# Patient Record
Sex: Female | Born: 1950 | Race: Black or African American | Hispanic: No | Marital: Married | Smoking: Never smoker
Health system: Southern US, Community
[De-identification: ages and names within clinical notes are randomized; demographics above are authoritative.]

## PROBLEM LIST (undated history)

## (undated) DIAGNOSIS — R079 Chest pain, unspecified: Secondary | ICD-10-CM

## (undated) DIAGNOSIS — M1990 Unspecified osteoarthritis, unspecified site: Secondary | ICD-10-CM

## (undated) DIAGNOSIS — I10 Essential (primary) hypertension: Secondary | ICD-10-CM

## (undated) DIAGNOSIS — G459 Transient cerebral ischemic attack, unspecified: Secondary | ICD-10-CM

## (undated) DIAGNOSIS — I639 Cerebral infarction, unspecified: Secondary | ICD-10-CM

## (undated) DIAGNOSIS — I6529 Occlusion and stenosis of unspecified carotid artery: Secondary | ICD-10-CM

## (undated) DIAGNOSIS — I5189 Other ill-defined heart diseases: Secondary | ICD-10-CM

## (undated) DIAGNOSIS — E785 Hyperlipidemia, unspecified: Secondary | ICD-10-CM

## (undated) DIAGNOSIS — K219 Gastro-esophageal reflux disease without esophagitis: Secondary | ICD-10-CM

## (undated) DIAGNOSIS — J45909 Unspecified asthma, uncomplicated: Secondary | ICD-10-CM

## (undated) HISTORY — DX: Cerebral infarction, unspecified: I63.9

## (undated) HISTORY — PX: BREAST EXCISIONAL BIOPSY: SUR124

## (undated) HISTORY — DX: Other ill-defined heart diseases: I51.89

## (undated) HISTORY — DX: Transient cerebral ischemic attack, unspecified: G45.9

## (undated) HISTORY — DX: Occlusion and stenosis of unspecified carotid artery: I65.29

## (undated) HISTORY — DX: Hyperlipidemia, unspecified: E78.5

## (undated) HISTORY — PX: CHOLECYSTECTOMY: SHX55

## (undated) HISTORY — PX: CARDIAC CATHETERIZATION: SHX172

## (undated) HISTORY — PX: ABDOMINAL HYSTERECTOMY: SHX81

## (undated) HISTORY — DX: Chest pain, unspecified: R07.9

## (undated) HISTORY — DX: Unspecified asthma, uncomplicated: J45.909

---

## 1999-03-23 ENCOUNTER — Ambulatory Visit (HOSPITAL_COMMUNITY): Admission: RE | Admit: 1999-03-23 | Discharge: 1999-03-23 | Payer: Self-pay

## 2003-11-14 ENCOUNTER — Other Ambulatory Visit: Payer: Self-pay

## 2004-08-06 ENCOUNTER — Other Ambulatory Visit: Payer: Self-pay

## 2004-11-09 ENCOUNTER — Ambulatory Visit: Payer: Self-pay | Admitting: Occupational Therapy

## 2005-11-10 ENCOUNTER — Emergency Department: Payer: Self-pay | Admitting: General Practice

## 2005-11-17 ENCOUNTER — Emergency Department: Payer: Self-pay | Admitting: Emergency Medicine

## 2005-11-24 ENCOUNTER — Emergency Department (HOSPITAL_COMMUNITY): Admission: EM | Admit: 2005-11-24 | Discharge: 2005-11-24 | Payer: Self-pay | Admitting: Emergency Medicine

## 2005-11-28 ENCOUNTER — Ambulatory Visit: Payer: Self-pay | Admitting: Internal Medicine

## 2005-12-03 ENCOUNTER — Ambulatory Visit: Payer: Self-pay | Admitting: *Deleted

## 2005-12-24 ENCOUNTER — Ambulatory Visit: Payer: Self-pay | Admitting: Occupational Therapy

## 2006-01-09 ENCOUNTER — Ambulatory Visit: Payer: Self-pay | Admitting: Nurse Practitioner

## 2006-06-26 ENCOUNTER — Emergency Department (HOSPITAL_COMMUNITY): Admission: EM | Admit: 2006-06-26 | Discharge: 2006-06-27 | Payer: Self-pay | Admitting: Emergency Medicine

## 2006-07-07 ENCOUNTER — Emergency Department: Payer: Self-pay | Admitting: Internal Medicine

## 2006-07-15 ENCOUNTER — Emergency Department: Payer: Self-pay | Admitting: Emergency Medicine

## 2006-07-15 ENCOUNTER — Other Ambulatory Visit: Payer: Self-pay

## 2006-09-29 ENCOUNTER — Emergency Department: Payer: Self-pay | Admitting: Emergency Medicine

## 2006-12-15 ENCOUNTER — Other Ambulatory Visit: Payer: Self-pay

## 2006-12-15 ENCOUNTER — Emergency Department: Payer: Self-pay | Admitting: Emergency Medicine

## 2007-01-14 ENCOUNTER — Ambulatory Visit: Payer: Self-pay | Admitting: Nurse Practitioner

## 2008-01-21 ENCOUNTER — Emergency Department: Payer: Self-pay | Admitting: Emergency Medicine

## 2008-04-06 ENCOUNTER — Ambulatory Visit: Payer: Self-pay | Admitting: Nurse Practitioner

## 2008-09-04 ENCOUNTER — Emergency Department: Payer: Self-pay | Admitting: Emergency Medicine

## 2008-09-21 ENCOUNTER — Emergency Department: Payer: Self-pay | Admitting: Emergency Medicine

## 2008-12-08 ENCOUNTER — Encounter: Payer: Self-pay | Admitting: Nurse Practitioner

## 2008-12-19 ENCOUNTER — Encounter: Payer: Self-pay | Admitting: Nurse Practitioner

## 2008-12-21 ENCOUNTER — Inpatient Hospital Stay: Payer: Self-pay | Admitting: Internal Medicine

## 2009-01-04 ENCOUNTER — Ambulatory Visit: Payer: Self-pay | Admitting: General Surgery

## 2009-01-10 ENCOUNTER — Ambulatory Visit: Payer: Self-pay | Admitting: General Surgery

## 2009-01-16 ENCOUNTER — Encounter: Payer: Self-pay | Admitting: Nurse Practitioner

## 2009-01-17 ENCOUNTER — Ambulatory Visit: Payer: Self-pay | Admitting: General Surgery

## 2009-01-31 ENCOUNTER — Ambulatory Visit: Payer: Self-pay | Admitting: General Surgery

## 2009-02-15 ENCOUNTER — Ambulatory Visit: Payer: Self-pay | Admitting: General Surgery

## 2009-06-22 ENCOUNTER — Ambulatory Visit: Payer: Self-pay | Admitting: Unknown Physician Specialty

## 2009-06-22 ENCOUNTER — Ambulatory Visit: Payer: Self-pay | Admitting: Physician Assistant

## 2009-06-27 ENCOUNTER — Ambulatory Visit: Payer: Self-pay | Admitting: Physician Assistant

## 2009-07-08 ENCOUNTER — Emergency Department: Payer: Self-pay | Admitting: Emergency Medicine

## 2009-07-17 ENCOUNTER — Ambulatory Visit: Payer: Self-pay | Admitting: Family Medicine

## 2009-07-26 ENCOUNTER — Emergency Department: Payer: Self-pay | Admitting: Emergency Medicine

## 2009-11-29 ENCOUNTER — Emergency Department: Payer: Self-pay | Admitting: Emergency Medicine

## 2010-01-04 ENCOUNTER — Ambulatory Visit: Payer: Self-pay | Admitting: Family Medicine

## 2010-04-30 ENCOUNTER — Observation Stay: Payer: Self-pay | Admitting: *Deleted

## 2010-05-23 ENCOUNTER — Emergency Department: Payer: Self-pay | Admitting: Emergency Medicine

## 2010-06-12 ENCOUNTER — Emergency Department: Payer: Self-pay | Admitting: Emergency Medicine

## 2010-06-26 ENCOUNTER — Ambulatory Visit: Payer: Self-pay | Admitting: Family Medicine

## 2010-07-17 ENCOUNTER — Inpatient Hospital Stay: Payer: Self-pay | Admitting: Internal Medicine

## 2010-08-25 ENCOUNTER — Emergency Department: Payer: Self-pay | Admitting: Emergency Medicine

## 2010-09-12 ENCOUNTER — Emergency Department: Payer: Self-pay | Admitting: Emergency Medicine

## 2010-09-20 ENCOUNTER — Other Ambulatory Visit: Payer: Self-pay | Admitting: Gastroenterology

## 2010-10-25 ENCOUNTER — Emergency Department: Payer: Self-pay | Admitting: Emergency Medicine

## 2010-11-21 ENCOUNTER — Emergency Department: Payer: Self-pay | Admitting: Emergency Medicine

## 2011-03-11 ENCOUNTER — Emergency Department: Payer: Self-pay | Admitting: Emergency Medicine

## 2011-04-24 ENCOUNTER — Emergency Department: Payer: Self-pay | Admitting: Emergency Medicine

## 2011-05-26 ENCOUNTER — Emergency Department: Payer: Self-pay | Admitting: Internal Medicine

## 2011-06-05 ENCOUNTER — Emergency Department: Payer: Self-pay | Admitting: Emergency Medicine

## 2011-08-01 ENCOUNTER — Emergency Department: Payer: Self-pay | Admitting: Emergency Medicine

## 2011-08-06 ENCOUNTER — Ambulatory Visit: Payer: Self-pay | Admitting: Family Medicine

## 2011-08-13 ENCOUNTER — Emergency Department: Payer: Self-pay | Admitting: Emergency Medicine

## 2011-08-29 ENCOUNTER — Emergency Department: Payer: Self-pay | Admitting: Emergency Medicine

## 2011-10-04 ENCOUNTER — Ambulatory Visit: Payer: Self-pay | Admitting: Family Medicine

## 2011-11-23 ENCOUNTER — Emergency Department: Payer: Self-pay | Admitting: Unknown Physician Specialty

## 2011-11-23 LAB — COMPREHENSIVE METABOLIC PANEL
Albumin: 3.9 g/dL (ref 3.4–5.0)
Alkaline Phosphatase: 71 U/L (ref 50–136)
Anion Gap: 14 (ref 7–16)
Glucose: 205 mg/dL — ABNORMAL HIGH (ref 65–99)
Osmolality: 285 (ref 275–301)
Potassium: 3.7 mmol/L (ref 3.5–5.1)
Total Protein: 7.3 g/dL (ref 6.4–8.2)

## 2011-11-23 LAB — CBC
MCV: 94 fL (ref 80–100)
Platelet: 264 10*3/uL (ref 150–440)
RBC: 5.41 10*6/uL — ABNORMAL HIGH (ref 3.80–5.20)
RDW: 13.6 % (ref 11.5–14.5)

## 2011-11-23 LAB — LIPASE, BLOOD: Lipase: 49 U/L — ABNORMAL LOW (ref 73–393)

## 2011-12-11 ENCOUNTER — Emergency Department: Payer: Self-pay

## 2012-01-30 ENCOUNTER — Inpatient Hospital Stay: Payer: Self-pay | Admitting: Specialist

## 2012-01-30 LAB — COMPREHENSIVE METABOLIC PANEL
Albumin: 3.9 g/dL (ref 3.4–5.0)
Alkaline Phosphatase: 71 U/L (ref 50–136)
Calcium, Total: 9.4 mg/dL (ref 8.5–10.1)
Chloride: 103 mmol/L (ref 98–107)
Co2: 21 mmol/L (ref 21–32)
Creatinine: 0.83 mg/dL (ref 0.60–1.30)
EGFR (Non-African Amer.): >60
Glucose: 167 mg/dL — ABNORMAL HIGH (ref 65–99)
Osmolality: 287 (ref 275–301)
Potassium: 3.8 mmol/L (ref 3.5–5.1)
SGOT(AST): 21 U/L (ref 15–37)
Total Protein: 6.9 g/dL (ref 6.4–8.2)

## 2012-01-30 LAB — CBC
HCT: 47.2 % — ABNORMAL HIGH (ref 35.0–47.0)
HGB: 15.6 g/dL (ref 12.0–16.0)
MCH: 31.1 pg (ref 26.0–34.0)
MCV: 94 fL (ref 80–100)
Platelet: 276 10*3/uL (ref 150–440)
RBC: 5.03 10*6/uL (ref 3.80–5.20)
RDW: 13.5 % (ref 11.5–14.5)

## 2012-01-30 LAB — TROPONIN I: Troponin-I: <0.02 ng/mL

## 2012-01-30 LAB — LIPASE, BLOOD: Lipase: 55 U/L — ABNORMAL LOW (ref 73–393)

## 2012-01-31 LAB — CBC WITH DIFFERENTIAL/PLATELET
Basophil #: 0 10*3/uL (ref 0.0–0.1)
Basophil %: 0.1 %
HCT: 36.7 % (ref 35.0–47.0)
HGB: 12.1 g/dL (ref 12.0–16.0)
Lymphocyte #: 2.9 10*3/uL (ref 1.0–3.6)
MCH: 31 pg (ref 26.0–34.0)
MCV: 94 fL (ref 80–100)
Monocyte #: 0.7 10*3/uL (ref 0.0–0.7)
Monocyte %: 5.5 %
Neutrophil #: 8.6 10*3/uL — ABNORMAL HIGH (ref 1.4–6.5)
Platelet: 245 10*3/uL (ref 150–440)
RBC: 3.91 10*6/uL (ref 3.80–5.20)
RDW: 14 % (ref 11.5–14.5)

## 2012-01-31 LAB — BASIC METABOLIC PANEL
Anion Gap: 12 (ref 7–16)
Chloride: 107 mmol/L (ref 98–107)
Creatinine: 0.8 mg/dL (ref 0.60–1.30)
EGFR (African American): >60
EGFR (Non-African Amer.): >60
Osmolality: 285 (ref 275–301)
Potassium: 3.9 mmol/L (ref 3.5–5.1)
Sodium: 143 mmol/L (ref 136–145)

## 2012-01-31 LAB — MAGNESIUM: Magnesium: 1.6 mg/dL — ABNORMAL LOW

## 2012-03-03 ENCOUNTER — Emergency Department: Payer: Self-pay | Admitting: Emergency Medicine

## 2012-03-03 LAB — COMPREHENSIVE METABOLIC PANEL
Anion Gap: 11 (ref 7–16)
Calcium, Total: 9.6 mg/dL (ref 8.5–10.1)
Co2: 28 mmol/L (ref 21–32)
Creatinine: 1.01 mg/dL (ref 0.60–1.30)
EGFR (African American): >60
Glucose: 178 mg/dL — ABNORMAL HIGH (ref 65–99)
Osmolality: 286 (ref 275–301)
SGPT (ALT): 15 U/L
Sodium: 141 mmol/L (ref 136–145)
Total Protein: 7.4 g/dL (ref 6.4–8.2)

## 2012-03-03 LAB — URINALYSIS, COMPLETE
Hyaline Cast: 15
Leukocyte Esterase: NEGATIVE
Nitrite: NEGATIVE
Ph: 5 (ref 4.5–8.0)
Protein: 30
RBC,UR: 1 /HPF (ref 0–5)
Specific Gravity: 1.036 (ref 1.003–1.030)
Squamous Epithelial: 6
WBC UR: 5 /HPF (ref 0–5)

## 2012-03-03 LAB — CBC
HCT: 48.7 % — ABNORMAL HIGH (ref 35.0–47.0)
HGB: 16.2 g/dL — ABNORMAL HIGH (ref 12.0–16.0)
MCHC: 33.2 g/dL (ref 32.0–36.0)
MCV: 94 fL (ref 80–100)
Platelet: 260 10*3/uL (ref 150–440)
RBC: 5.2 10*6/uL (ref 3.80–5.20)
RDW: 13.6 % (ref 11.5–14.5)

## 2012-03-08 ENCOUNTER — Observation Stay: Payer: Self-pay | Admitting: Internal Medicine

## 2012-03-08 LAB — COMPREHENSIVE METABOLIC PANEL
Alkaline Phosphatase: 76 U/L (ref 50–136)
Anion Gap: 10 (ref 7–16)
Chloride: 106 mmol/L (ref 98–107)
EGFR (Non-African Amer.): >60
Glucose: 125 mg/dL — ABNORMAL HIGH (ref 65–99)
Osmolality: 284 (ref 275–301)
Potassium: 3.5 mmol/L (ref 3.5–5.1)
SGPT (ALT): 13 U/L
Total Protein: 6.8 g/dL (ref 6.4–8.2)

## 2012-03-08 LAB — URINALYSIS, COMPLETE
Bilirubin,UR: NEGATIVE
Nitrite: NEGATIVE
Ph: 5 (ref 4.5–8.0)
RBC,UR: 2 /HPF (ref 0–5)
Specific Gravity: 1.008 (ref 1.003–1.030)
WBC UR: 7 /HPF (ref 0–5)

## 2012-03-08 LAB — CBC
HGB: 12.5 g/dL (ref 12.0–16.0)
MCHC: 33 g/dL (ref 32.0–36.0)
RBC: 4.02 10*6/uL (ref 3.80–5.20)
RDW: 13.5 % (ref 11.5–14.5)
WBC: 10.9 10*3/uL (ref 3.6–11.0)

## 2012-03-09 LAB — CBC WITH DIFFERENTIAL/PLATELET
Basophil #: 0 10*3/uL (ref 0.0–0.1)
Basophil %: 0.5 %
Eosinophil #: 0.4 10*3/uL (ref 0.0–0.7)
Eosinophil %: 5.6 %
HCT: 34.5 % — ABNORMAL LOW (ref 35.0–47.0)
HGB: 11.5 g/dL — ABNORMAL LOW (ref 12.0–16.0)
Lymphocyte #: 2.9 10*3/uL (ref 1.0–3.6)
MCHC: 33.3 g/dL (ref 32.0–36.0)
MCV: 93 fL (ref 80–100)
Platelet: 211 10*3/uL (ref 150–440)
RBC: 3.71 10*6/uL — ABNORMAL LOW (ref 3.80–5.20)
RDW: 13.7 % (ref 11.5–14.5)
WBC: 7.2 10*3/uL (ref 3.6–11.0)

## 2012-03-09 LAB — HEMOGLOBIN A1C: Hemoglobin A1C: 6.3 % (ref 4.2–6.3)

## 2012-03-09 LAB — LIPID PANEL
Cholesterol: 157 mg/dL (ref 0–200)
HDL Cholesterol: 62 mg/dL — ABNORMAL HIGH (ref 40–60)
Ldl Cholesterol, Calc: 71 mg/dL (ref 0–100)
VLDL Cholesterol, Calc: 24 mg/dL (ref 5–40)

## 2012-03-31 ENCOUNTER — Emergency Department: Payer: Self-pay | Admitting: Emergency Medicine

## 2012-04-04 ENCOUNTER — Emergency Department: Payer: Self-pay | Admitting: *Deleted

## 2012-04-07 ENCOUNTER — Emergency Department: Payer: Self-pay | Admitting: Internal Medicine

## 2012-04-07 LAB — URINALYSIS, COMPLETE
Bacteria: NONE SEEN
Bilirubin,UR: NEGATIVE
Glucose,UR: NEGATIVE mg/dL (ref 0–75)
Ketone: NEGATIVE
Ph: 8 (ref 4.5–8.0)
Protein: NEGATIVE
RBC,UR: 1 /HPF (ref 0–5)
Squamous Epithelial: 2

## 2012-04-07 LAB — DRUG SCREEN, URINE
Amphetamines, Ur Screen: NEGATIVE
Barbiturates, Ur Screen: NEGATIVE
Benzodiazepine, Ur Scrn: POSITIVE
Cannabinoid 50 Ng, Ur ~~LOC~~: NEGATIVE
Cocaine Metabolite,Ur ~~LOC~~: NEGATIVE
MDMA (Ecstasy)Ur Screen: NEGATIVE
Methadone, Ur Screen: NEGATIVE
Opiate, Ur Screen: NEGATIVE
Phencyclidine (PCP) Ur S: NEGATIVE
Tricyclic, Ur Screen: NEGATIVE

## 2012-04-07 LAB — COMPREHENSIVE METABOLIC PANEL
Alkaline Phosphatase: 73 U/L (ref 50–136)
Anion Gap: 9 (ref 7–16)
Bilirubin,Total: 0.4 mg/dL (ref 0.2–1.0)
Co2: 28 mmol/L (ref 21–32)
Creatinine: 0.86 mg/dL (ref 0.60–1.30)
EGFR (Non-African Amer.): >60
Glucose: 123 mg/dL — ABNORMAL HIGH (ref 65–99)
Osmolality: 281 (ref 275–301)
SGOT(AST): 10 U/L — ABNORMAL LOW (ref 15–37)
SGPT (ALT): 12 U/L
Sodium: 140 mmol/L (ref 136–145)
Total Protein: 7.1 g/dL (ref 6.4–8.2)

## 2012-04-07 LAB — CBC
HCT: 38 %
HGB: 12.8 g/dL
MCH: 31.3 pg
MCHC: 33.7 g/dL
MCV: 93 fL
Platelet: 206 x10 3/mm 3
RBC: 4.1 X10 6/mm 3
RDW: 13.5 %
WBC: 8.2 x10 3/mm 3

## 2012-04-07 LAB — PROTIME-INR: INR: 0.8

## 2012-04-07 LAB — CK TOTAL AND CKMB (NOT AT ARMC): CK, Total: 31 U/L (ref 21–215)

## 2012-04-15 ENCOUNTER — Other Ambulatory Visit: Payer: Self-pay | Admitting: Orthopedic Surgery

## 2012-04-15 DIAGNOSIS — M542 Cervicalgia: Secondary | ICD-10-CM

## 2012-04-19 ENCOUNTER — Ambulatory Visit
Admission: RE | Admit: 2012-04-19 | Discharge: 2012-04-19 | Disposition: A | Discharge status: Home / Self Care | Payer: BC Managed Care – PPO | Source: Ambulatory Visit | Attending: Orthopedic Surgery | Admitting: Orthopedic Surgery

## 2012-04-19 DIAGNOSIS — M542 Cervicalgia: Secondary | ICD-10-CM

## 2012-05-10 ENCOUNTER — Encounter (HOSPITAL_COMMUNITY): Payer: Self-pay

## 2012-05-10 ENCOUNTER — Emergency Department (HOSPITAL_COMMUNITY)
Admission: EM | Admit: 2012-05-10 | Discharge: 2012-05-10 | Disposition: A | Discharge status: Home / Self Care | Payer: BC Managed Care – PPO | Attending: Emergency Medicine | Admitting: Emergency Medicine

## 2012-05-10 DIAGNOSIS — M549 Dorsalgia, unspecified: Secondary | ICD-10-CM | POA: Insufficient documentation

## 2012-05-10 DIAGNOSIS — M542 Cervicalgia: Secondary | ICD-10-CM | POA: Insufficient documentation

## 2012-05-10 HISTORY — DX: Essential (primary) hypertension: I10

## 2012-05-10 HISTORY — DX: Cerebral infarction, unspecified: I63.9

## 2012-05-10 HISTORY — DX: Unspecified osteoarthritis, unspecified site: M19.90

## 2012-05-10 MED ORDER — OXYCODONE-ACETAMINOPHEN 5-325 MG PO TABS: 1.0000 | ORAL_TABLET | Freq: Four times a day (QID) | ORAL | Status: AC | PRN

## 2012-05-10 MED ORDER — DIAZEPAM 5 MG PO TABS
5.0000 mg | ORAL_TABLET | Freq: Once | ORAL | Status: AC
  Administered 2012-05-10: 5 mg via ORAL
  Filled 2012-05-10: qty 1

## 2012-05-10 MED ORDER — OXYCODONE-ACETAMINOPHEN 5-325 MG PO TABS
2.0000 | ORAL_TABLET | Freq: Once | ORAL | Status: AC
  Administered 2012-05-10: 2 via ORAL
  Filled 2012-05-10: qty 2

## 2012-05-10 MED ORDER — MORPHINE SULFATE 4 MG/ML IJ SOLN
INTRAMUSCULAR | Status: AC
  Filled 2012-05-10: qty 1

## 2012-05-10 MED ORDER — DIAZEPAM 5 MG PO TABS: 5.0000 mg | ORAL_TABLET | Freq: Two times a day (BID) | ORAL | Status: DC

## 2012-05-10 NOTE — ED Provider Notes (Signed)
History     CSN: [id number]  Arrival date & time 05/10/12  1015   First MD Initiated Contact with Patient 05/10/12 1045      Chief Complaint  Patient presents with  . Neck Pain  . Back Pain    (Consider location/radiation/quality/duration/timing/severity/associated sxs/prior treatment) HPI Comments: Patient comes in with a chief complaint of right sided neck pain.  Pain has been present for the past month.  She has been evaluated by Orthopedics for this and was prescribed Hydrocodone and Robaxin, which she does not feel is helping.  She also had a recent MRI, which showed mild foraminal narrowing at multiple levels of the c-spine.  She has also seen her Chiropractor for this pain and has had adjustments done, which she does not think has helped.  She denies any numbness or tingling.  Denies any acute trauma or injury.  Neck pain worse with lateral movement of the neck.  She denies headache.  Denies upper extremity weakness.  Patient is a 61 y.o. female presenting with neck pain and back pain. The history is provided by the patient and medical records.  Neck Pain  Pertinent negatives include no numbness and no headaches.  Back Pain  Pertinent negatives include no fever, no numbness and no headaches.    No past medical history on file.  No past surgical history on file.  No family history on file.  History  Substance Use Topics  . Smoking status: Not on file  . Smokeless tobacco: Not on file  . Alcohol Use:     OB History    Grav Para Term Preterm Abortions TAB SAB Ect Mult Living                  Review of Systems  Constitutional: Negative for fever and chills.  HENT: Positive for neck pain and neck stiffness.   Eyes: Negative for visual disturbance.  Gastrointestinal: Negative for nausea and vomiting.  Musculoskeletal: Positive for back pain. Negative for gait problem.  Skin: Negative for color change.  Neurological: Negative for dizziness, syncope,  light-headedness, numbness and headaches.    Allergies  Latex and Lidocaine  Home Medications   Current Outpatient Rx  Name Route Sig Dispense Refill  . CARVEDILOL 6.25 MG PO TABS Oral Take 6.25 mg by mouth 2 (two) times daily with a meal.    . CLOPIDOGREL BISULFATE 75 MG PO TABS Oral Take 75 mg by mouth daily.    Marland Kitchen HYDROCODONE-ACETAMINOPHEN 7.5-325 MG PO TABS Oral Take 1 tablet by mouth every 6 (six) hours as needed. For pain    . METFORMIN HCL ER 500 MG PO TB24 Oral Take 1,000 mg by mouth daily with breakfast.    . METHOCARBAMOL 500 MG PO TABS Oral Take 500 mg by mouth every 8 (eight) hours as needed. For muscle spasms    . OMEPRAZOLE 20 MG PO CPDR Oral Take 20 mg by mouth daily.    Marland Kitchen SIMVASTATIN 40 MG PO TABS Oral Take 40 mg by mouth every evening.      BP 168/94  Pulse 84  Temp 98 F (36.7 C) (Oral)  Resp 16  SpO2 98%  Physical Exam  Nursing note and vitals reviewed. Constitutional: She appears well-developed and well-nourished. No distress.  HENT:  Head: Normocephalic and atraumatic.  Mouth/Throat: Oropharynx is clear and moist.  Eyes: EOM are normal. Pupils are equal, round, and reactive to light.  Neck: Muscular tenderness present. No spinous process tenderness present. Decreased range of  motion present. No edema and no erythema present.       Decreased lateral movement of the neck.  Cardiovascular: Normal rate, regular rhythm, normal heart sounds and intact distal pulses.   Pulmonary/Chest: Effort normal and breath sounds normal. No respiratory distress. She has no wheezes.  Neurological: She is alert. She has normal strength. No sensory deficit. Gait normal.  Reflex Scores:      Brachioradialis reflexes are 2+ on the right side and 2+ on the left side.      Grip strength 5/5 bilaterally  Skin: Skin is warm and dry. She is not diaphoretic. No erythema.  Psychiatric: She has a normal mood and affect.    ED Course  Procedures (including critical care time)  Labs  Reviewed - No data to display No results found.   No diagnosis found.    MDM  Patient presenting with neck pain for the past month.  Patient with recent MRI showing mild foraminal narrowing at multiple levels of c-spine.  Patient requesting referral to a different Orthopedist.  Patient given referral and instructed to follow up with them.        Pascal Lux [place], PA-C 05/10/12 2052

## 2012-05-10 NOTE — ED Provider Notes (Signed)
Medical screening examination/treatment/procedure(s) were performed by non-physician practitioner and as supervising physician I was immediately available for consultation/collaboration.   Carleene Cooper III, MD 05/10/12 [phone]

## 2012-05-10 NOTE — ED Notes (Signed)
Pt reports having a "crick" in her posterior neck and (R) side neck starting 03/31/2012. Pt reports (R) ear pain x2 days, states "my ear is stopped up." Pt reports coming into the ED several times receiving IV pain meds, prescription pain meds, muscle relaxer, and using OTC "muscle ache cream, ice/heat packs" Pt reports seeing Dr. Alveda Reasons, had a MRI of her neck and back and was informed she had arthritis in her lower back. Pt also seeing a chiropractor; Dr. Glendale Chard twice a week, he informed pt her neck was "out of line." Pt states "somebody has got to tell me something, I can't keep dealing with this pain."

## 2012-05-10 NOTE — ED Notes (Signed)
Pt c/o neck pain x1 month

## 2012-05-10 NOTE — Discharge Instructions (Signed)
Only take pain medications for severe pain.  Do not drive or operate heavy machinery while taking pain medication or muscle relaxer.  Cervical Sprain A cervical sprain is an injury in the neck in which the ligaments are stretched or torn. The ligaments are the tissues that hold the bones of the neck (vertebrae) in place.Cervical sprains can range from very mild to very severe. Most cervical sprains get better in 1 to 3 weeks, but it depends on the cause and extent of the injury. Severe cervical sprains can cause the neck vertebrae to be unstable. This can lead to damage of the spinal cord and can result in serious nervous system problems. Your caregiver will determine whether your cervical sprain is mild or severe. CAUSES  Severe cervical sprains may be caused by:  Contact sport injuries (football, rugby, wrestling, hockey, auto racing, gymnastics, diving, martial arts, boxing).   Motor vehicle collisions.   Whiplash injuries. This means the neck is forcefully whipped backward and forward.   Falls.  Mild cervical sprains may be caused by:   Awkward positions, such as cradling a telephone between your ear and shoulder.   Sitting in a chair that does not offer proper support.   Working at a poorly Marketing executive station.   Activities that require looking up or down for long periods of time.  SYMPTOMS   Pain, soreness, stiffness, or a burning sensation in the front, back, or sides of the neck. This discomfort may develop immediately after injury or it may develop slowly and not begin for 24 hours or more after an injury.   Pain or tenderness directly in the middle of the back of the neck.   Shoulder or upper back pain.   Limited ability to move the neck.   Headache.   Dizziness.   Weakness, numbness, or tingling in the hands or arms.   Muscle spasms.   Difficulty swallowing or chewing.   Tenderness and swelling of the neck.  DIAGNOSIS  Most of the time, your caregiver  can diagnose this problem by taking your history and doing a physical exam. Your caregiver will ask about any known problems, such as arthritis in the neck or a previous neck injury. X-rays may be taken to find out if there are any other problems, such as problems with the bones of the neck. However, an X-ray often does not reveal the full extent of a cervical sprain. Other tests such as a computed tomography (CT) scan or magnetic resonance imaging (MRI) may be needed. TREATMENT  Treatment depends on the severity of the cervical sprain. Mild sprains can be treated with rest, keeping the neck in place (immobilization), and pain medicines. Severe cervical sprains need immediate immobilization and an appointment with an orthopedist or neurosurgeon. Several treatment options are available to help with pain, muscle spasms, and other symptoms. Your caregiver may prescribe:  Medicines, such as pain relievers, numbing medicines, or muscle relaxants.   Physical therapy. This can include stretching exercises, strengthening exercises, and posture training. Exercises and improved posture can help stabilize the neck, strengthen muscles, and help stop symptoms from returning.   A neck collar to be worn for short periods of time. Often, these collars are worn for comfort. However, certain collars may be worn to protect the neck and prevent further worsening of a serious cervical sprain.  HOME CARE INSTRUCTIONS   Put ice on the injured area.   Put ice in a plastic bag.   Place a towel between your  skin and the bag.   Leave the ice on for 15 to 20 minutes, 3 to 4 times a day.   Only take over-the-counter or prescription medicines for pain, discomfort, or fever as directed by your caregiver.   Keep all follow-up appointments as directed by your caregiver.   Keep all physical therapy appointments as directed by your caregiver.   If a neck collar is prescribed, wear it as directed by your caregiver.   Do not  drive while wearing a neck collar.   Make any needed adjustments to your work station to promote good posture.   Avoid positions and activities that make your symptoms worse.   Warm up and stretch before being active to help prevent problems.  SEEK MEDICAL CARE IF:   Your pain is not controlled with medicine.   You are unable to decrease your pain medicine over time as planned.   Your activity level is not improving as expected.  SEEK IMMEDIATE MEDICAL CARE IF:   You develop any bleeding, stomach upset, or signs of an allergic reaction to your medicine.   Your symptoms get worse.   You develop new, unexplained symptoms.   You have numbness, tingling, weakness, or paralysis in any part of your body.  MAKE SURE YOU:   Understand these instructions.   Will watch your condition.   Will get help right away if you are not doing well or get worse.  Document Released: 09/01/2007 Document Revised: 10/24/2011 Document Reviewed: 08/07/2011 [facility] Patient Information 2012 [place], [place].

## 2012-05-10 NOTE — ED Notes (Signed)
Pt requesting a referral for a neurologist and PT consult

## 2012-05-11 ENCOUNTER — Encounter (HOSPITAL_COMMUNITY): Payer: Self-pay | Admitting: *Deleted

## 2012-05-11 ENCOUNTER — Observation Stay (HOSPITAL_COMMUNITY): Payer: BC Managed Care – PPO

## 2012-05-11 ENCOUNTER — Observation Stay (HOSPITAL_COMMUNITY)
Admission: EM | Admit: 2012-05-11 | Discharge: 2012-05-13 | Disposition: A | Discharge status: Home / Self Care | Payer: BC Managed Care – PPO | Attending: Family Medicine | Admitting: Family Medicine

## 2012-05-11 DIAGNOSIS — E785 Hyperlipidemia, unspecified: Secondary | ICD-10-CM

## 2012-05-11 DIAGNOSIS — R109 Unspecified abdominal pain: Secondary | ICD-10-CM | POA: Insufficient documentation

## 2012-05-11 DIAGNOSIS — H8110 Benign paroxysmal vertigo, unspecified ear: Secondary | ICD-10-CM

## 2012-05-11 DIAGNOSIS — I10 Essential (primary) hypertension: Secondary | ICD-10-CM

## 2012-05-11 DIAGNOSIS — Z8673 Personal history of transient ischemic attack (TIA), and cerebral infarction without residual deficits: Secondary | ICD-10-CM | POA: Insufficient documentation

## 2012-05-11 DIAGNOSIS — Q283 Other malformations of cerebral vessels: Secondary | ICD-10-CM | POA: Insufficient documentation

## 2012-05-11 DIAGNOSIS — E119 Type 2 diabetes mellitus without complications: Secondary | ICD-10-CM

## 2012-05-11 DIAGNOSIS — M542 Cervicalgia: Secondary | ICD-10-CM | POA: Diagnosis present

## 2012-05-11 DIAGNOSIS — R197 Diarrhea, unspecified: Secondary | ICD-10-CM | POA: Insufficient documentation

## 2012-05-11 DIAGNOSIS — R42 Dizziness and giddiness: Principal | ICD-10-CM | POA: Insufficient documentation

## 2012-05-11 LAB — BASIC METABOLIC PANEL
Calcium: 10.6 mg/dL — ABNORMAL HIGH (ref 8.4–10.5)
GFR calc Af Amer: >90 mL/min (ref >90)
GFR calc non Af Amer: >90 mL/min (ref >90)
Potassium: 4 mEq/L (ref 3.5–5.1)
Sodium: 140 mEq/L (ref 135–145)

## 2012-05-11 LAB — DIFFERENTIAL
Basophils Absolute: 0 10*3/uL (ref 0.0–0.1)
Eosinophils Absolute: 0.1 10*3/uL (ref 0.0–0.7)
Eosinophils Relative: 1 % (ref 0–5)
Lymphs Abs: 1.4 10*3/uL (ref 0.7–4.0)
Neutrophils Relative %: 76 % (ref 43–77)

## 2012-05-11 LAB — CBC
MCH: 30.9 pg (ref 26.0–34.0)
MCV: 90.4 fL (ref 78.0–100.0)
Platelets: 242 10*3/uL (ref 150–400)
RBC: 4.7 MIL/uL (ref 3.87–5.11)
RDW: 12.4 % (ref 11.5–15.5)
WBC: 8.4 10*3/uL (ref 4.0–10.5)

## 2012-05-11 LAB — GLUCOSE, CAPILLARY
Glucose-Capillary: 116 mg/dL — ABNORMAL HIGH (ref 70–99)
Glucose-Capillary: 119 mg/dL — ABNORMAL HIGH (ref 70–99)

## 2012-05-11 MED ORDER — CARVEDILOL 6.25 MG PO TABS
6.2500 mg | ORAL_TABLET | Freq: Once | ORAL | Status: AC
  Administered 2012-05-11: 6.25 mg via ORAL
  Filled 2012-05-11: qty 1

## 2012-05-11 MED ORDER — DIAZEPAM 5 MG/ML IJ SOLN
5.0000 mg | Freq: Once | INTRAMUSCULAR | Status: AC
  Administered 2012-05-11: 5 mg via INTRAVENOUS
  Filled 2012-05-11: qty 2

## 2012-05-11 MED ORDER — MECLIZINE HCL 25 MG PO TABS
25.0000 mg | ORAL_TABLET | Freq: Once | ORAL | Status: AC
  Administered 2012-05-11: 25 mg via ORAL
  Filled 2012-05-11: qty 1

## 2012-05-11 MED ORDER — GADOBENATE DIMEGLUMINE 529 MG/ML IV SOLN
20.0000 mL | Freq: Once | INTRAVENOUS | Status: AC | PRN
  Administered 2012-05-11: 20 mL via INTRAVENOUS

## 2012-05-11 MED ORDER — FENTANYL CITRATE 0.05 MG/ML IJ SOLN
50.0000 ug | Freq: Once | INTRAMUSCULAR | Status: AC
  Administered 2012-05-11: 50 ug via INTRAVENOUS
  Filled 2012-05-11: qty 2

## 2012-05-11 MED ORDER — HYDROMORPHONE HCL PF 1 MG/ML IJ SOLN
0.5000 mg | Freq: Once | INTRAMUSCULAR | Status: AC
  Administered 2012-05-11: 0.5 mg via INTRAVENOUS
  Filled 2012-05-11: qty 1

## 2012-05-11 MED ORDER — FENTANYL CITRATE 0.05 MG/ML IJ SOLN
50.0000 ug | Freq: Once | INTRAMUSCULAR | Status: AC
Start: 2012-05-11 — End: 2012-05-11
  Administered 2012-05-11: 50 ug via INTRAVENOUS
  Filled 2012-05-11: qty 2

## 2012-05-11 NOTE — ED Provider Notes (Signed)
History     CSN: [id number]  Arrival date & time 05/11/12  1107   First MD Initiated Contact with Patient 05/11/12 1531      Chief Complaint  Patient presents with  . Dizziness  . Neck Pain  . Shoulder Pain    (Consider location/radiation/quality/duration/timing/severity/associated sxs/prior treatment) HPI Comments: Patient presents with persistent right neck and shoulder pain that's been ongoing for the last month.  Patient does note that she's seen her primary care physician for this as well as an orthopedic Dr. and had an MRI of her neck completed at the end of May.  This showed some mild arthritis changes and no significant spinal stenosis.  Patient has no defined weakness or numbness.  Patient was seen in the emergency part yesterday for pain and given Percocet and Robaxin.  Patient actually has a scheduled followup with orthopedics tomorrow.  Patient comes back in today because last night in the middle of the night she felt too dizzy and was unable to get out of bed on her own.  She felt that the room was spinning which is a new sensation for her.  She also feels like her right ear is plugged.  She has some degree of tinnitus.  Again there's been no focal weakness, numbness or slurred speech.  Patient notes when she tries to walk she feels very unsteady and like she is drifting to the right because things are moving.  Patient's had no new falls or trauma.  She has seen a chiropractor for her neck pain who has performed some adjustments.  Patient also notes she didn't take any of her morning medications.  She's not taking any pain medications or muscle relaxants this morning either.  The history is provided by the patient.    Past Medical History  Diagnosis Date  . Diabetes mellitus   . Hypertension   . Stroke   . Arthritis     Past Surgical History  Procedure Date  . Abdominal hysterectomy   . Cholecystectomy     History reviewed. No pertinent family history.  History    Substance Use Topics  . Smoking status: Never Smoker   . Smokeless tobacco: Not on file  . Alcohol Use: No    OB History    Grav Para Term Preterm Abortions TAB SAB Ect Mult Living                  Review of Systems  Constitutional: Negative.  Negative for fever and chills.  Eyes: Negative.   Respiratory: Negative.  Negative for cough and shortness of breath.   Cardiovascular: Negative.  Negative for chest pain.  Gastrointestinal: Positive for nausea. Negative for vomiting, abdominal pain and diarrhea.  Genitourinary: Negative.  Negative for dysuria.  Musculoskeletal: Negative for back pain.  Skin: Negative.  Negative for color change and rash.  Neurological: Positive for dizziness and headaches. Negative for syncope, facial asymmetry, weakness, light-headedness and numbness.  Hematological: Negative.  Negative for adenopathy.  Psychiatric/Behavioral: Negative.   All other systems reviewed and are negative.    Allergies  Latex and Lidocaine  Home Medications   Current Outpatient Rx  Name Route Sig Dispense Refill  . CARVEDILOL 6.25 MG PO TABS Oral Take 6.25 mg by mouth 2 (two) times daily with a meal.    . CLOPIDOGREL BISULFATE 75 MG PO TABS Oral Take 75 mg by mouth daily.    Marland Kitchen DIAZEPAM 5 MG PO TABS Oral Take 1 tablet (5 mg total) by  mouth 2 (two) times daily. 10 tablet 0  . METFORMIN HCL ER 500 MG PO TB24 Oral Take 500 mg by mouth daily with breakfast.     . METHOCARBAMOL 500 MG PO TABS Oral Take 500 mg by mouth every 8 (eight) hours as needed. For muscle spasms    . OMEPRAZOLE 20 MG PO CPDR Oral Take 20 mg by mouth daily.    . OXYCODONE-ACETAMINOPHEN 5-325 MG PO TABS Oral Take 1-2 tablets by mouth every 6 (six) hours as needed for pain. 20 tablet 0  . SIMVASTATIN 40 MG PO TABS Oral Take 40 mg by mouth every evening.      BP 164/102  Pulse 87  Temp 98.5 F (36.9 C) (Oral)  Resp 22  SpO2 100%  Physical Exam  Nursing note and vitals reviewed. Constitutional: She  is oriented to person, place, and time. She appears well-developed and well-nourished.  Non-toxic appearance. She does not have a sickly appearance.  HENT:  Head: Normocephalic and atraumatic.  Eyes: Conjunctivae, EOM and lids are normal. Pupils are equal, round, and reactive to light. No scleral icterus.  Neck: Trachea normal and normal range of motion. Neck supple.  Cardiovascular: Normal rate, regular rhythm and normal heart sounds.        Palpable radial pulses bilaterally  Pulmonary/Chest: Effort normal and breath sounds normal.  Abdominal: Soft. Normal appearance. There is no tenderness. There is no rebound, no guarding and no CVA tenderness.  Musculoskeletal: Normal range of motion.  Neurological: She is alert and oriented to person, place, and time. She has normal strength.       Face is symmetric.  Extraocular eye movements are intact.  She does gets onset of vertigo sensation with extraocular eye movements.  She was able to perform finger to nose testing bilaterally but did appear to have some difficulty with this and had to do it slowly.  She had symmetric strength in her upper extremities but noted pain on trying to raise her right arm to shoulder height.  She had symmetric strength in her lower extremities but did not ambulate due to the dizziness and spinning sensation for me.  No focal losses of sensation.  No dysarthria.  Skin: Skin is warm, dry and intact. No rash noted.  Psychiatric: She has a normal mood and affect. Her behavior is normal. Judgment and thought content normal.    ED Course  Procedures (including critical care time)  Labs Reviewed  GLUCOSE, CAPILLARY - Abnormal; Notable for the following:    Glucose-Capillary 116 (*)     All other components within normal limits  CBC  DIFFERENTIAL  BASIC METABOLIC PANEL   No results found.   No diagnosis found.    MDM  Patient with right-sided neck pain that has been ongoing for the last month but given her new  symptoms of vertigo with history of stroke, hypertension and diabetes I'm concerned about possible vertebral basilar insufficiency or vertebral artery dissection given recent visits to the chiropractor.  I will give the patient meclizine to see if it assists with her symptoms.  I believe the patient should also have an MRI of her brain and an MRA to assess for the above noted diagnoses.  Patient's elevated blood pressure is likely related to patient missing doses of medications this morning.        Nat Christen, MD 05/11/12 [phone]

## 2012-05-11 NOTE — H&P (Signed)
Family Medicine Teaching [facility] Admission History and Physical  Patient name: Destiny Savage Medical record number: [id number] Date of birth: 02-15-1951 Age: 61 y.o. Gender: female  Primary Care Provider: Jaclyn Shaggy, MD  Chief Complaint: vertigo History of Present Illness: Destiny Savage is a 61 y.o. year old female with HTN, HLD, DM, and right sided neck pain presenting with vertigo x1 day.  She was in her usual state of health until 3am this morning.  At that time she sat up in bed and complained of dizziness (like the room was spinning) and her head feeling really heavy causing her to fall back in bed.  This was repeated with her husband helping her to sit up.  She also experienced the room spinning when she would turn her head, but worse with sitting.  States does okay as long as she can lie down.  Mild nausea.  No emesis, no loss of consciousness, no head trauma.  Eating and drinking well.  No fevers, cough, dyspnea, diaphoresis, chest pain, abdominal pain, diarrhea.  Has never had anything like this before.  Of note, has been having right sided neck pain for the last month and she was started on percocet, robaxin and valium for this.   ED Course: meclizine x1, valium x1 with minimal improvement  Patient Active Problem List  Diagnosis  . Vertigo  . Hypertension  . Diabetes type 2, controlled  . Neck pain on right side   Past Medical History: Past Medical History  Diagnosis Date  . Diabetes mellitus   . Hypertension   . Stroke   . Arthritis     Past Surgical History: Past Surgical History  Procedure Date  . Abdominal hysterectomy   . Cholecystectomy     Social History: History   Social History  . Marital Status: Single    Spouse Name: N/A    Number of Children: N/A  . Years of Education: N/A   Social History Main Topics  . Smoking status: Never Smoker   . Smokeless tobacco: None  . Alcohol Use: No  . Drug Use: No  . Sexually Active:    Other Topics  Concern  . None   Social History Narrative  . None  Lives at home with husband; doing very well.   Family History: History reviewed. No pertinent family history.  Allergies: Allergies  Allergen Reactions  . Latex Hives  . Lidocaine Hives    Current Facility-Administered Medications  Medication Dose Route Frequency Provider Last Rate Last Dose  . carvedilol (COREG) tablet 6.25 mg  6.25 mg Oral Once Nat Christen, MD   6.25 mg at 05/11/12 1936  . diazepam (VALIUM) injection 5 mg  5 mg Intravenous Once [employer], PA-C   5 mg at 05/11/12 1940  . fentaNYL (SUBLIMAZE) injection 50 mcg  50 mcg Intravenous Once Nat Christen, MD   50 mcg at 05/11/12 1659  . fentaNYL (SUBLIMAZE) injection 50 mcg  50 mcg Intravenous Once [employer], PA-C   50 mcg at 05/11/12 1931  . gadobenate dimeglumine (MULTIHANCE) injection 20 mL  20 mL Intravenous Once PRN Medication Radiologist, MD   20 mL at 05/11/12 1835  . HYDROmorphone (DILAUDID) injection 0.5 mg  0.5 mg Intravenous Once Rodena Medin, PA-C   0.5 mg at 05/11/12 2118  . meclizine (ANTIVERT) tablet 25 mg  25 mg Oral Once Nat Christen, MD   25 mg at 05/11/12 1610   Current Outpatient Prescriptions  Medication Sig Dispense Refill  .  carvedilol (COREG) 6.25 MG tablet Take 6.25 mg by mouth 2 (two) times daily with a meal.      . clopidogrel (PLAVIX) 75 MG tablet Take 75 mg by mouth daily.      . diazepam (VALIUM) 5 MG tablet Take 1 tablet (5 mg total) by mouth 2 (two) times daily.  10 tablet  0  . metFORMIN (GLUCOPHAGE-XR) 500 MG 24 hr tablet Take 500 mg by mouth daily with breakfast.       . methocarbamol (ROBAXIN) 500 MG tablet Take 500 mg by mouth every 8 (eight) hours as needed. For muscle spasms      . omeprazole (PRILOSEC) 20 MG capsule Take 20 mg by mouth daily.      Marland Kitchen oxyCODONE-acetaminophen (PERCOCET) 5-325 MG per tablet Take 1-2 tablets by mouth every 6 (six) hours as needed for pain.  20 tablet  0  . simvastatin  (ZOCOR) 40 MG tablet Take 40 mg by mouth every evening.       Review Of Systems: Per HPI with the following additions: none Otherwise 12 point review of systems was performed and was unremarkable.  Physical Exam: Pulse: 84  Blood Pressure: 146/78 RR: 16   O2: 98 on RA Temp: 99  General: alert, cooperative, appears stated age, no distress, moderately obese and mild distress with siiting up HEENT: PERRLA, extra ocular movement intact, sclera clear, anicteric and oropharynx clear, no lesions Heart: S1, S2 normal, no murmur, rub or gallop, regular rate and rhythm Lungs: clear to auscultation, no wheezes or rales and unlabored breathing Abdomen: abdomen is soft without significant tenderness, masses, organomegaly or guarding Extremities: extremities normal, atraumatic, no cyanosis or edema Skin:no rashes, no ecchymoses Neurology: normal without focal findings, mental status, speech normal, alert and oriented x3, PERLA and cranial nerves 2-12 intact; no nystagmus on exam, patient did complain of room spinning with sitting (no nystagmus)  Labs and Imaging: Lab Results  Component Value Date/Time   NA 140 05/11/2012  3:57 PM   K 4.0 05/11/2012  3:57 PM   CL 99 05/11/2012  3:57 PM   CO2 28 05/11/2012  3:57 PM   BUN 7 05/11/2012  3:57 PM   CREATININE 0.73 05/11/2012  3:57 PM   GLUCOSE 116* 05/11/2012  3:57 PM   Lab Results  Component Value Date   WBC 8.4 05/11/2012   HGB 14.5 05/11/2012   HCT 42.5 05/11/2012   MCV 90.4 05/11/2012   PLT 242 05/11/2012   MRI Brain No acute intracranial findings.  MRA head Negative. No intracranial stenosis or aneurysm.  MRA neck No evidence for carotid or vertebral dissection. Nonstenotic plaque  both carotid bifurcations.  Assessment and Plan: Destiny Savage is a 61 y.o. year old female with HTN, DM, HLD presenting with positional vertigo.  # Vertigo: Likely BPPV.  MRI/A did not show any acute lesions.  Neurologic exam normal making central vertigo less  likely.  Vertigo is a potential side effect of robaxin which she takes for neck pain.  Unable to complete Dix-Hallpike given patient size and vertigo with sitting. - will hold robaxin - consult vestibular PT - meclizine 25mg  q6hr  # Neck pain: Ongoing for last month.  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 Is beeing seen by orthopedics.  Currently taking robaxin, percocet, valium. - will hold robaxin given vertigo - continue home percocet and valium  # HTN: Elevated in the ED.  - continue home carvedilol - will give hydralazine 10mg  IV  for BP >180/110  # HLD: Stable.  Well controlled per patient. - continue home statin  # DM: Well controlled per patient report.  CBGs in the 110s in the ED. - will hold metformin given contrast study in ED - monitor CBGs; will start SSI if needed  # H/o TIA: Stable.  - continue home plavix  FEN/GI: carb modified diet; SLIV Prophylaxis: SQ lovenox Disposition: admit to floor for observation  BOOTH, ERIN 05/11/2012, 11:00 PM   UPPER LEVEL ADDENDUM  I have seen and examined Destiny Savage with Dr. Elwyn Reach and I agree with the above assessment/plan. I have reviewed all available data and have made any necessary changes to the above H&P.  Destiny Savage 05/11/2012, 11:45 PM

## 2012-05-11 NOTE — ED Notes (Signed)
Patient reports she was seen here on yesterday for neck pain and shoulder pain.  She states she has been taking the meds as directed.  She states today she could not hardly get her head off the pillow.  Patient states she feels like her head is spinning and is heavy.  Patient reports she took 2 percocet and one other pill

## 2012-05-11 NOTE — ED Notes (Addendum)
Pt. Reports neck pain x 1 month. Reports she was seen yesterday for  Increased neck pain, right shoulder pain, and dizziness. Pain increased with movement and palpation.  Reports pressure in right ear, and headache. Reports nausea x 1 day. No vomiting. A. O. X 4.  Husband at bedside.

## 2012-05-11 NOTE — ED Provider Notes (Signed)
61 y/o female with vertigo and right shoulder/anterior neck pain. MRA/MRI of brain and c-spine show no acute findings. On physical exam, Pt shows vertigo persisting with meclizine and valium. Pt cannot ambulate secondary to vertigo. Will admit for observation.   Wynetta Emery, PA-C 05/11/12 2254

## 2012-05-12 ENCOUNTER — Encounter (HOSPITAL_COMMUNITY): Payer: Self-pay | Admitting: *Deleted

## 2012-05-12 LAB — BASIC METABOLIC PANEL
BUN: 12 mg/dL (ref 6–23)
Calcium: 10.1 mg/dL (ref 8.4–10.5)
Creatinine, Ser: 0.89 mg/dL (ref 0.50–1.10)
GFR calc Af Amer: 80 mL/min — ABNORMAL LOW (ref >90)
GFR calc non Af Amer: 69 mL/min — ABNORMAL LOW (ref >90)

## 2012-05-12 LAB — GLUCOSE, CAPILLARY
Glucose-Capillary: 123 mg/dL — ABNORMAL HIGH (ref 70–99)
Glucose-Capillary: 142 mg/dL — ABNORMAL HIGH (ref 70–99)
Glucose-Capillary: 141 mg/dL — ABNORMAL HIGH (ref 70–99)

## 2012-05-12 LAB — CBC
MCHC: 33.1 g/dL (ref 30.0–36.0)
Platelets: 233 10*3/uL (ref 150–400)
RDW: 12.5 % (ref 11.5–15.5)
WBC: 8.1 10*3/uL (ref 4.0–10.5)

## 2012-05-12 MED ORDER — CLOPIDOGREL BISULFATE 75 MG PO TABS
75.0000 mg | ORAL_TABLET | Freq: Every day | ORAL | Status: DC
  Administered 2012-05-12 – 2012-05-13 (×2): 75 mg via ORAL
  Filled 2012-05-12 (×2): qty 1

## 2012-05-12 MED ORDER — SODIUM CHLORIDE 0.9 % IJ SOLN: 3.0000 mL | INTRAMUSCULAR | Status: DC | PRN

## 2012-05-12 MED ORDER — SIMVASTATIN 40 MG PO TABS
40.0000 mg | ORAL_TABLET | Freq: Every evening | ORAL | Status: DC
  Administered 2012-05-12 – 2012-05-13 (×2): 40 mg via ORAL
  Filled 2012-05-12 (×2): qty 1

## 2012-05-12 MED ORDER — ONDANSETRON 4 MG PO TBDP: 4.0000 mg | ORAL_TABLET | Freq: Three times a day (TID) | ORAL | Status: AC | PRN

## 2012-05-12 MED ORDER — SODIUM CHLORIDE 0.9 % IV SOLN: 250.0000 mL | INTRAVENOUS | Status: DC | PRN

## 2012-05-12 MED ORDER — SODIUM CHLORIDE 0.9 % IJ SOLN
3.0000 mL | Freq: Two times a day (BID) | INTRAMUSCULAR | Status: DC
  Administered 2012-05-12 – 2012-05-13 (×3): 3 mL via INTRAVENOUS

## 2012-05-12 MED ORDER — DIAZEPAM 5 MG PO TABS
5.0000 mg | ORAL_TABLET | Freq: Two times a day (BID) | ORAL | Status: DC
  Administered 2012-05-12: 5 mg via ORAL
  Filled 2012-05-12 (×2): qty 1

## 2012-05-12 MED ORDER — CYCLOBENZAPRINE HCL 5 MG PO TABS
5.0000 mg | ORAL_TABLET | Freq: Three times a day (TID) | ORAL | Status: DC
  Administered 2012-05-12 – 2012-05-13 (×5): 5 mg via ORAL
  Filled 2012-05-12 (×6): qty 1

## 2012-05-12 MED ORDER — ACETAMINOPHEN 650 MG RE SUPP: 650.0000 mg | Freq: Four times a day (QID) | RECTAL | Status: DC | PRN

## 2012-05-12 MED ORDER — PANTOPRAZOLE SODIUM 40 MG PO TBEC
40.0000 mg | DELAYED_RELEASE_TABLET | Freq: Every day | ORAL | Status: DC
  Administered 2012-05-12 – 2012-05-13 (×2): 40 mg via ORAL
  Filled 2012-05-12: qty 1

## 2012-05-12 MED ORDER — MECLIZINE HCL 25 MG PO TABS
25.0000 mg | ORAL_TABLET | Freq: Four times a day (QID) | ORAL | Status: DC
  Administered 2012-05-12 – 2012-05-13 (×8): 25 mg via ORAL
  Filled 2012-05-12 (×10): qty 1

## 2012-05-12 MED ORDER — ACETAMINOPHEN 325 MG PO TABS: 650.0000 mg | ORAL_TABLET | Freq: Four times a day (QID) | ORAL | Status: DC | PRN

## 2012-05-12 MED ORDER — CARVEDILOL 6.25 MG PO TABS
6.2500 mg | ORAL_TABLET | Freq: Two times a day (BID) | ORAL | Status: DC
  Administered 2012-05-12 – 2012-05-13 (×4): 6.25 mg via ORAL
  Filled 2012-05-12 (×5): qty 1

## 2012-05-12 MED ORDER — CYCLOBENZAPRINE HCL 10 MG PO TABS: 10.0000 mg | ORAL_TABLET | Freq: Three times a day (TID) | ORAL | Status: AC

## 2012-05-12 MED ORDER — ONDANSETRON HCL 4 MG/2ML IJ SOLN: 4.0000 mg | Freq: Four times a day (QID) | INTRAMUSCULAR | Status: DC | PRN

## 2012-05-12 MED ORDER — HYDRALAZINE HCL 20 MG/ML IJ SOLN
10.0000 mg | Freq: Four times a day (QID) | INTRAMUSCULAR | Status: DC | PRN
  Filled 2012-05-12: qty 0.5

## 2012-05-12 MED ORDER — METFORMIN HCL ER 500 MG PO TB24: 500.0000 mg | ORAL_TABLET | Freq: Every day | ORAL | Status: DC

## 2012-05-12 MED ORDER — ENOXAPARIN SODIUM 40 MG/0.4ML ~~LOC~~ SOLN
40.0000 mg | SUBCUTANEOUS | Status: DC
  Administered 2012-05-12 – 2012-05-13 (×2): 40 mg via SUBCUTANEOUS
  Filled 2012-05-12 (×2): qty 0.4

## 2012-05-12 MED ORDER — MECLIZINE HCL 25 MG PO TABS: 25.0000 mg | ORAL_TABLET | Freq: Four times a day (QID) | ORAL | Status: AC

## 2012-05-12 MED ORDER — ONDANSETRON HCL 4 MG PO TABS: 4.0000 mg | ORAL_TABLET | Freq: Four times a day (QID) | ORAL | Status: DC | PRN

## 2012-05-12 MED ORDER — OXYCODONE-ACETAMINOPHEN 5-325 MG PO TABS
1.0000 | ORAL_TABLET | Freq: Four times a day (QID) | ORAL | Status: DC | PRN
  Administered 2012-05-12 – 2012-05-13 (×5): 2 via ORAL
  Filled 2012-05-12 (×5): qty 2

## 2012-05-12 NOTE — Progress Notes (Signed)
PGY-1 Daily Progress Note Family Medicine Teaching Service Destiny Savage. Destiny Sleigh, MD Service Pager: [phone]   Subjective: says able to sit up in bed this morning and not get incredibly dizzy. Improved for patient. Did have to lay back down this morning when she tried to get up. States she always falls to the right.   Objective:  Temp:  [97.9 F (36.6 C)-99 F (37.2 C)] 97.9 F (36.6 C) (06/25 0545) Pulse Rate:  [7-87] 80  (06/25 0545) Resp:  [16-24] 16  (06/25 0545) BP: (141-186)/(78-118) 141/90 mmHg (06/25 0545) SpO2:  [98 %-100 %] 99 % (06/25 0545) Weight:  [212 lb 1.3 oz (96.2 kg)] 212 lb 1.3 oz (96.2 kg) (06/25 0545)  Intake/Output Summary (Last 24 hours) at 05/12/12 0911 Last data filed at 05/12/12 0813  Gross per 24 hour  Intake    480 ml  Output      0 ml  Net    480 ml    Gen:  NAD, resting in bed HEENT: moist mucous membranes CV: Regular rate and rhythm, no murmurs rubs or gallops PULM: clear to auscultation bilaterally. No wheezes/rales/rhonchi ABD: soft/nontender/nondistended/normal bowel sounds EXT: No edema Neuro: Alert and oriented x3, Dix Hallpike negative but limited by bed positioning and limited neck mobility-used pillows to allow head to drop.   Labs and imaging:   CBC  Lab 05/12/12 0550 05/11/12 1557  WBC 8.1 8.4  HGB 13.1 14.5  HCT 39.6 42.5  PLT 233 242   BMET/CMET  Lab 05/12/12 0550 05/11/12 1557  NA 139 140  K 3.9 4.0  CL 101 99  CO2 28 28  BUN 12 7  CREATININE 0.89 0.73  CALCIUM 10.1 10.6*  PROT -- --  BILITOT -- --  ALKPHOS -- --  ALT -- --  AST -- --  GLUCOSE 133* 116*    Mr Angiogram Head Wo Contrast  05/11/2012  *RADIOLOGY REPORT*  Clinical Data:    Vertiginous symptoms with right-sided neck pain. Recent chiropractic visit.  MRI HEAD WITHOUT AND WITH CONTRAST MRA HEAD WITHOUT CONTRAST MRA NECK WITHOUT AND WITH CONTRAST  Technique: Multiplanar, multiecho pulse sequences of the brain and surrounding structures were obtained  according to standard protocol without and with intravenous contrast.  Angiographic images of the Circle of Willis were obtained using MRA technique without intravenous contrast.  Angiographic images of the neck were obtained using MRA technique without and with intravenous contrast.  Contrast: MultiHance 20 ml.  Comparison: MRI C-spine 04/19/2012.  MRI HEAD WITHOUT AND WITH CONTRAST  Findings:  There is no evidence for acute infarction, intracranial hemorrhage, mass lesion, hydrocephalus, or extra-axial fluid. There is no significant atrophy.  There is minimal white matter disease.  There are no foci of chronic hemorrhage.  There is no midline shift.  Post infusion, there is no abnormal enhancement of the brain or meninges. 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  Partial empty sella. No tonsillar herniation.  There  is mild chronic sinus disease.  No mastoid fluid is seen.  IMPRESSION: No acute intracranial findings.  MRA HEAD WITHOUT CONTRAST  Findings:  Both internal carotid arteries are widely patent.  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  Vertebrals are codominant.  No cerebellar branch occlusion.  No intracranial aneurysm.  IMPRESSION: Negative.  No intracranial stenosis or aneurysm.   MRA NECK WITHOUT AND WITH CONTRAST  Findings:  Bovine origin to the left common carotid and innominate.  No proximal stenosis of the carotids or subclavians.  Prominent posterior wall plaque distal common carotid/proximal internal carotid bifurcation on the left.  No flow reducing lesion. No left carotid dissection.  No fibromuscular dysplasia.  Mild posterior wall plaque distal right common carotid/proximal right internal carotid. No flow reducing lesion.  No right carotid dissection.  No fibromuscular dysplasia.  Bilateral vertebral arteries are patent from their origins to the basilar.  The right is dominant.  There are no areas suspicious for  dissection.  Compared with the appearance of the vertebral arteries from prior MRI C-spine 04/19/2012 the appearance is similar.  IMPRESSION: No evidence for carotid or vertebral dissection. Nonstenotic plaque both carotid bifurcations.  Original Report Authenticated By: Elsie Stain, M.D.     Assessment  Destiny Savage is a 61 y.o. year old female with HTN, DM, HLD presenting with positional vertigo.   # Vertigo: Vertigo is a potential side effect of robaxin which she takes for neck pain-believe this is most likely cause at this ime. possibly BPPV but dix hallpike negative but admittedly imperfect exam. Does occur with head movement.  MRI/A did not show any acute lesions and reassuring for no central cause. Neurologic exam normal making central vertigo less likely.    - will hold robaxin   - consult vestibular PT   - meclizine 25mg  q6hr. Able to sit up in bed with this regimen.   -will also obtain orthostatics given that it always occurs with sitting up.   # Neck pain: Ongoing for last month. 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 Is beeing seen by orthopedics. Currently taking robaxin, percocet, valium.   - will hold robaxin given vertigo per #1  - continue home percocet and valium   # HTN: Elevated in the ED. Mild elevation this AM at 141/90. continued home carvedilol. Hydralazine prn 10mg  IV for BP >180/110. Outpatient  follow up when acute issues resolved.  # HLD: Stable. Well controlled per patient. continued home statin  # DM: Well controlled per patient report. CBGs <130 well controlled.  hold metformin x 2 days given contrast study in ED. Continue to monitor.  # H/o TIA: Stable. continued home plavix   FEN/GI: carb modified diet; SLIV  Prophylaxis: SQ lovenox  Disposition: possible discharge home with meclizine after vestibular PT sees patient if she is able to ambulate.    Tana Conch, MD PGY1, Family Medicine Teaching  Service [phone]

## 2012-05-12 NOTE — Evaluation (Signed)
Physical Therapy Evaluation Patient Details Name: Destiny Savage MRN: [id number] DOB: 11/03/1951 Today's Date: 05/12/2012 Time: 4098-1191 PT Time Calculation (min): 73 min  PT Assessment / Plan / Recommendation Clinical Impression  Pt admitted with vertigo and also with 4-5 week h/o increasing Rt neck pain, including Rt arm numbness, and swollen painful area Rt subclavian.  Due to pt's neck pain, ideal vestibular testing and treatment positions could not be obtained, but were approxiimated with use of hospital bed and trendelenburg positioning. Pt had also had 3 doses of meclizine since midnight, that also would mask nystagmus and dampen symptoms while being tested. Given these limitations, pt did have spinning sensation with modified Hallpike-Dix to Lt that lasted 15 seconds and was rated 6/10. Performed modified Epley maneuver for likely Lt posterior canal BPPV. Instructed pt to sleep with HOB elevated >30 degrees to assist with cannalith repositioning. Spoke with RN who then called MD to update on pt status and requested delay of d/c until after PT 6/26. (And also to report Rt subclavian edema, pain). Will see 6/26 a.m.    PT Assessment  Patient needs continued PT services    Follow Up Recommendations  Home health PT;Supervision for mobility/OOB;Other (comment) (vestibular rehab)    Barriers to Discharge None      Equipment Recommendations  Rolling walker with 5" wheels    Recommendations for Other Services     Frequency Min 5X/week    Precautions / Restrictions Precautions Precautions: Fall   Pertinent Vitals/Pain Headache up to 8/10 with cannalith repositioning; subsided to 3/10 by end of session.      Mobility  Bed Mobility Bed Mobility: Rolling Right;Rolling Left;Right Sidelying to Sit;Sitting - Scoot to Edge of Bed Rolling Right: 4: Min assist;With rail Rolling Left: 6: Modified independent (Device/Increase time);With rail Right Sidelying to Sit: 4: Min assist;With  rails;HOB elevated Sitting - Scoot to Edge of Bed: 6: Modified independent (Device/Increase time) Details for Bed Mobility Assistance: Rolling Rt limited by Rt neck pain; rolling completed as part of vestibular assessment and treatment; side to sit at end of modified Epley maneuver assisted due to Rt neck pain and dizziness 2/10 Transfers Transfers: Sit to Stand;Stand to Sit Sit to Stand: 4: Min guard;With upper extremity assist;From bed;From toilet Stand to Sit: 4: Min guard;With upper extremity assist;To bed;To toilet Details for Transfer Assistance: vc for safe use of RW; minguard for safety with dizziness 3/10 Ambulation/Gait Ambulation/Gait Assistance: 4: Min guard Ambulation Distance (Feet): 100 Feet Assistive device: Rolling walker Ambulation/Gait Assistance Details: Very slow cadence with dizziness up to 3/10; good use of RW after instructional cues Gait Pattern: Within Functional Limits Gait velocity: severely decr    Exercises     PT Diagnosis: Difficulty walking;Acute pain;Other (comment) (vertigo)  PT Problem List: Decreased balance;Decreased activity tolerance;Decreased mobility;Decreased knowledge of use of DME;Pain;Other (comment) (vertigo) PT Treatment Interventions: DME instruction;Gait training;Functional mobility training;Therapeutic activities;Other (comment) (vestibular rehab, compensatory strategies)   PT Goals Acute Rehab PT Goals PT Goal Formulation: With patient Time For Goal Achievement: 05/15/12 Potential to Achieve Goals: Good Pt will go Supine/Side to Sit: with modified independence;with HOB 0 degrees;Other (comment) (dizziness <2/10) PT Goal: Supine/Side to Sit - Progress: Goal set today Pt will go Sit to Supine/Side: with modified independence;with HOB 0 degrees;Other (comment) (dizziness <2/10) PT Goal: Sit to Supine/Side - Progress: Goal set today Pt will go Sit to Stand: with supervision;with upper extremity assist;Other (comment) (dizziness <2/10) PT  Goal: Sit to Stand - Progress: Goal set today Pt will  Ambulate: >150 feet;with supervision;with least restrictive assistive device;Other (comment) (dizziness <2/10) PT Goal: Ambulate - Progress: Goal set today Additional Goals Additional Goal #1: Pt will be able to independently verbalize and demonstrate use of compensatory strategies to minimize vertigo while mobilizing. PT Goal: Additional Goal #1 - Progress: Goal set today  Visit Information  Last PT Received On: 05/12/12 Assistance Needed: +1    Subjective Data  Subjective: Pt reports she had spinning feeling when she sat up earlier today on EOB--not as severe as at home. Patient Stated Goal: Find out what is causing pain in Rt subclavian area; Rt neck; and stop being so dizzy   Prior Functioning  Home Living Lives With: Spouse Available Help at Discharge: Family;Available 24 hours/day Type of Home: House Home Access: Ramped entrance Home Layout: One level Bathroom Shower/Tub: Tub/shower unit;Curtain Firefighter: Standard Bathroom Accessibility: Yes How Accessible: Accessible via walker Home Adaptive Equipment: None Prior Function Level of Independence: Independent Able to Take Stairs?: Reciprically Driving: No Vocation: Retired Musician: No difficulties Dominant Hand: Left    Cognition  Overall Cognitive Status: Appears within functional limits for tasks assessed/performed Arousal/Alertness: Awake/alert Orientation Level: Appears intact for tasks assessed Behavior During Session: [facility] for tasks performed    Extremity/Trunk Assessment Right Upper Extremity Assessment RUE Sensation: Deficits (pt reports shoulder down to hand is numb (decr ~50%)) Right Lower Extremity Assessment RLE ROM/Strength/Tone: WFL for tasks assessed Left Lower Extremity Assessment LLE ROM/Strength/Tone: WFL for tasks assessed Trunk Assessment Trunk Assessment: Normal   Balance    End of Session PT - End of  Session Equipment Utilized During Treatment: Gait belt Activity Tolerance: Patient limited by pain;Other (comment) (limited by vertigo; double vision) Patient left: Other (comment);with call bell/phone within reach (sitting EOB with meal tray) Nurse Communication: Mobility status;Other (comment) (pain subclavian area; need to delay d/c until 6/26)  GP Functional Limitation: Mobility: Walking and moving around Mobility: Walking and Moving Around Current Status [phone]): At least 1 percent but less than 20 percent impaired, limited or restricted Mobility: Walking and Moving Around Goal Status [phone]): At least 1 percent but less than 20 percent impaired, limited or restricted Mobility: Walking and Moving Around Discharge Status [phone]): At least 1 percent but less than 20 percent impaired, limited or restricted   Gennavieve Huq 05/12/2012, 5:27 PM  Pager [phone]

## 2012-05-12 NOTE — Discharge Summary (Signed)
Physician Discharge Summary  Patient ID: Destiny Savage MRN: [id number] DOB: Oct 26, 1951 Age: 61 y.o.  Admit date: 05/11/2012 Discharge date: 05/12/2012  PCP: Jaclyn Shaggy, MD    Discharge Diagnosis: Principal Problem:  *Vertigo Active Problems:  Hypertension  Diabetes type 2, controlled  Neck pain on right side  Hospital Course Destiny Savage is a 61 y.o. year old female with HTN, DM, HLD presenting with positional vertigo.   1.  Vertigo: Patient with spinning of room vs. Dizziness everytime she sits along with head movements up at presentation. Central causes investigated with MRI/MRA-no acute findings (see full report below). Neurological exam without focal deficits.  Dix-hall pike negative but limited by patient's inability to turn neck to 45 degrees due to problem #2. Still thought an element of benign positional vertigo given head movements. Patient was able to stand and walk with rolling walker once she was started on meclizine 25mg  q6 hours.  Consulted vestibular PT who worked with patient and recommended HHPT which was arranged. Patient was on robaxin for muscle spasms which can cause vertigo so this was discontinued. Also stopped valium due to dizziness.  Also possible is neck pain, spasm, vertigo/dizziness cycles induced by head movement. Started patient on Flexeril 10mg  TID prn. Furthermore, obtained orthostatics which were negative from blood pressure perspective but HR did elevate by 22. This was thought to be due to patient dizziness/anxiety with quick rising and not due to dehydration as exam not consistent with dehydration.    2 Neck pain: Ongoing for last month. 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 Is beeing seen by orthopedics. Currently taking robaxin, percocet, valium. Made changes to this regimen per #1-held valium and robaxin. Started flexeril. Continued percocet.   3. HTN: Elevated in the ED, but improved on home meds (carvedilol) once  vertigo with improved control.   4. HLD: Stable. Well controlled per patient. continued home statin  5. DM: Well controlled per patient report. CBGs <130 well controlled. hold metformin x 2 days given contrast study in ED. Continue to monitor.  6. H/o TIA: Stable. continued home plavix     Procedures/Imaging:  Mr Angiogram Head Wo Contrast  05/11/2012  *RADIOLOGY REPORT*  Clinical Data:    Vertiginous symptoms with right-sided neck pain. Recent chiropractic visit.  MRI HEAD WITHOUT AND WITH CONTRAST MRA HEAD WITHOUT CONTRAST MRA NECK WITHOUT AND WITH CONTRAST  Technique: Multiplanar, multiecho pulse sequences of the brain and surrounding structures were obtained according to standard protocol without and with intravenous contrast.  Angiographic images of the Circle of Willis were obtained using MRA technique without intravenous contrast.  Angiographic images of the neck were obtained using MRA technique without and with intravenous contrast.  Contrast: MultiHance 20 ml.  Comparison: MRI C-spine 04/19/2012.  MRI HEAD WITHOUT AND WITH CONTRAST  Findings:  There is no evidence for acute infarction, intracranial hemorrhage, mass lesion, hydrocephalus, or extra-axial fluid. There is no significant atrophy.  There is minimal white matter disease.  There are no foci of chronic hemorrhage.  There is no midline shift.  Post infusion, there is no abnormal enhancement of the brain or meninges. 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  Partial empty sella. No tonsillar herniation.  There  is mild chronic sinus disease.  No mastoid fluid is seen.  IMPRESSION: No acute intracranial findings.  MRA HEAD WITHOUT CONTRAST  Findings:  Both internal carotid arteries are widely patent.  There is basilar hypoplasia due to fetal  origin of both posterior cerebral arteries.  Vertebrals are codominant.  No cerebellar branch occlusion.  No intracranial aneurysm.  IMPRESSION: Negative.  No intracranial  stenosis or aneurysm.   MRA NECK WITHOUT AND WITH CONTRAST  Findings:  Bovine origin to the left common carotid and innominate. No proximal stenosis of the carotids or subclavians.  Prominent posterior wall plaque distal common carotid/proximal internal carotid bifurcation on the left.  No flow reducing lesion. No left carotid dissection.  No fibromuscular dysplasia.  Mild posterior wall plaque distal right common carotid/proximal right internal carotid. No flow reducing lesion.  No right carotid dissection.  No fibromuscular dysplasia.  Bilateral vertebral arteries are patent from their origins to the basilar.  The right is dominant.  There are no areas suspicious for dissection.  Compared with the appearance of the vertebral arteries from prior MRI C-spine 04/19/2012 the appearance is similar.  IMPRESSION: No evidence for carotid or vertebral dissection. Nonstenotic plaque both carotid bifurcations.  Original Report Authenticated By: Elsie Stain, M.D.   Mr Cervical Spine Wo Contrast  04/19/2012  *RADIOLOGY REPORT*  Clinical Data: Neck pain.  Stiffness.  MRI CERVICAL SPINE WITHOUT CONTRAST  Technique:  Multiplanar and multiecho pulse sequences of the cervical spine, to include the craniocervical junction and cervicothoracic junction, were obtained according to standard protocol without intravenous contrast.  Comparison: None.  Findings: Normal signal is present in the cervical and upper thoracic spinal cord to the lowest imaged level, T2-3.  The craniocervical junction is within normal limits.  Prominent Schmorl's nodes are present at C5-6 and C7-T1.  Marrow signal, vertebral body heights, and alignment are otherwise normal.  There is some straightening of the normal cervical lordosis.  Flow is present in the major vascular structures of the neck.  C2-3:  Negative.  C3-4:  A broad-based disc osteophyte complex is present.  This partially effaces the ventral CSF.  Mild central and bilateral foraminal narrowing  is present.  C4-5:  Mild uncovertebral spurring is present without significant stenosis.  C5-6:  Mild uncovertebral spurring is present bilaterally.  Mild foraminal narrowing bilaterally is worse on the left.  Facet hypertrophy contributes.  C6-7:  A broad-based disc osteophyte complex is asymmetric to the left.  The left-sided uncovertebral spurring is present.  This leads to mild left central and left foraminal narrowing.  C7-T1:  Mild disc bulging is present.  The disc bulges into the foramina bilaterally with mild bilateral foraminal narrowing. Uncovertebral spurring contributes.  There is no significant stenosis in the upper thoracic spine.  IMPRESSION:  1.  Mild central and bilateral foraminal narrowing at C3-4. 2.  Minimal uncovertebral spurring at C4-5 without significant stenosis. 3.  Mild foraminal narrowing bilaterally at C5-6 is worse on the left. 4.  Mild left central and left foraminal stenosis at C6-7. 5.  Mild bilateral foraminal narrowing at C7-T1.  Original Report Authenticated By: Jamesetta Orleans. MATTERN, M.D.    Labs  CBC  Lab 05/12/12 0550 05/11/12 1557  WBC 8.1 8.4  HGB 13.1 14.5  HCT 39.6 42.5  PLT 233 242   BMET  Lab 05/12/12 0550 05/11/12 1557  NA 139 140  K 3.9 4.0  CL 101 99  CO2 28 28  BUN 12 7  CREATININE 0.89 0.73  CALCIUM 10.1 10.6*  PROT -- --  BILITOT -- --  ALKPHOS -- --  ALT -- --  AST -- --  GLUCOSE 133* 116*        Patient condition at time of discharge/disposition:  stable  Disposition-home with rolling walker and shower stool.    Follow up issues: 1. Follow up vertigo. Believe this will be better controlled once patient's neck pain/spasms are better controlled. We are trying flexeril at this time.  2. Patient was set up with home vestibular PT therapy for concern of BPPV. Please see if this is helping patient.   Discharge follow up:  Follow-up Information    Follow up with TATE,DENNY C, MD. Schedule an appointment as soon as possible for a  visit in 1 week. (for hospital follow up)    Contact information:   [address]   [place] [address] [phone]         Discharge Orders    Future Orders Please Complete By Expires   Diet - low sodium heart healthy      Increase activity slowly      Discharge instructions      Comments:   STOP taking the following medicines: robaxin, valium.  Start taking flexeril instead of robaxin for your neck pain. We think it may work better than the robaxin and it shouldn't cause dizziness.  We think you may be having room spinning/dizziness due to your pain and neck spasms, we hope this new medicine (flexeril) will improve the spasms and dizziness/room spinning.  Start taking meclizine every 6 hours for at least 24 hours then as needed.  Remember to use the walker and shower seat until your strength is improved.  Use exercises that physical therapy provided when you can tolerate them.   Driving Restrictions      Comments:   dont drive while taking flexeril.       Destiny Savage, Destiny Savage  Home Medication Instructions [id number]   Printed on:05/12/12 1545  Medication Information                    omeprazole (PRILOSEC) 20 MG capsule Take 20 mg by mouth daily.           simvastatin (ZOCOR) 40 MG tablet Take 40 mg by mouth every evening.           carvedilol (COREG) 6.25 MG tablet Take 6.25 mg by mouth 2 (two) times daily with a meal.           clopidogrel (PLAVIX) 75 MG tablet Take 75 mg by mouth daily.           oxyCODONE-acetaminophen (PERCOCET) 5-325 MG per tablet Take 1-2 tablets by mouth every 6 (six) hours as needed for pain.           ondansetron (ZOFRAN ODT) 4 MG disintegrating tablet Take 1 tablet (4 mg total) by mouth every 8 (eight) hours as needed for nausea.           cyclobenzaprine (FLEXERIL) 10 MG tablet Take 1 tablet (10 mg total) by mouth 3 (three) times daily.           meclizine (ANTIVERT) 25 MG tablet Take 1 tablet (25 mg total) by  mouth every 6 (six) hours. For at least 24 hours then as needed.           metFORMIN (GLUCOPHAGE-XR) 500 MG 24 hr tablet Take 1 tablet (500 mg total) by mouth daily with breakfast. Do not start taking again until 05/14/12                  Tana Conch, MD of [facility] 05/12/2012 3:01 PM

## 2012-05-12 NOTE — H&P (Signed)
I interviewed and examined this patient and discussed the care plan with Dr. Fara Boros and the [facility] team and agree with assessment and plan as documented in the admission note for today. I performed the Dix-Halpike maneuver with the limitation that she could not rotate and extend her neck to the right. No vertigo or nystagmus was produced in any position. She clearly has cervical radiculopathy with mild numbness in the right thumb and index finger. I would recommend Cyclobenzaprine in place of Robaxin and Valium and Meclizine, hoping to have less central medication effects. We will observe for anticholinergic adverse effects which it can also produce.     Kyleigha Markert A. Sheffield Slider, MD Family Medicine Teaching Service Attending  05/12/2012 4:34 PM

## 2012-05-12 NOTE — Evaluation (Signed)
Occupational Therapy Evaluation Patient Details Name: Destiny Savage MRN: [id number] DOB: [birth year] Today's Date: 05/12/2012 Time: 8413-2440 OT Time Calculation (min): 22 min  OT Assessment / Plan / Recommendation Clinical Impression  Pt admitted with dizziness which she feels has improved, but still persists. Pt describes as "light-headed" feeling. Pt will benefit from skilled OT in the acute setting to increase I with ADL's and assess for DME needs as appropriate.     OT Assessment  Patient needs continued OT Services    Follow Up Recommendations  No OT follow up (vs HHOT)    Barriers to Discharge      Equipment Recommendations   (TBD- may benefit from shower seat)    Recommendations for Other Services    Frequency  Min 2X/week    Precautions / Restrictions Precautions Precautions: Fall Restrictions Weight Bearing Restrictions: No   Pertinent Vitals/Pain Pt reports neck pain but did not rate. Repositioned     ADL  Eating/Feeding: Simulated;Independent Where Assessed - Eating/Feeding: Edge of bed Grooming: Performed;Wash/dry hands;Min guard Where Assessed - Grooming: Supported standing Upper Body Dressing: Simulated;Set up;Supervision/safety Where Assessed - Upper Body Dressing: Unsupported sitting Lower Body Dressing: Performed;Minimal assistance Where Assessed - Lower Body Dressing: Sopported sit to stand Toilet Transfer: Simulated;Min guard Toilet Transfer Method: Sit to Barista:  (from bed) Toileting - Clothing Manipulation and Hygiene: Simulated;Minimal assistance Where Assessed - Engineer, mining and Hygiene: Standing Equipment Used: Rolling walker;Gait belt Transfers/Ambulation Related to ADLs: Min guard A with RW ambulation throughout room; pt with 1 lateral/posterior LOB upon standing from the bed- gently help lower pt back to bed from standing    OT Diagnosis: Generalized weakness  OT Problem List: Decreased activity  tolerance;Impaired balance (sitting and/or standing);Decreased knowledge of use of DME or AE;Pain OT Treatment Interventions: Self-care/ADL training;DME and/or AE instruction;Balance training;Patient/family education   OT Goals Acute Rehab OT Goals OT Goal Formulation: With patient Time For Goal Achievement: 05/26/12 Potential to Achieve Goals: Good ADL Goals Pt Will Perform Grooming: Independently;Standing at sink ADL Goal: Grooming - Progress: Goal set today Pt Will Perform Upper Body Dressing: with modified independence;Independently;Sitting, bed;Sitting, chair ADL Goal: Upper Body Dressing - Progress: Goal set today Pt Will Perform Lower Body Dressing: with supervision;with set-up;Sit to stand from bed;Sit to stand from chair ADL Goal: Lower Body Dressing - Progress: Goal set today Pt Will Transfer to Toilet: with modified independence;Ambulation;Regular height toilet ADL Goal: Toilet Transfer - Progress: Goal set today Pt Will Perform Toileting - Clothing Manipulation: with modified independence;Standing ADL Goal: Toileting - Clothing Manipulation - Progress: Goal set today Pt Will Perform Tub/Shower Transfer: Tub transfer;with set-up;with supervision;Ambulation;with DME ADL Goal: Tub/Shower Transfer - Progress: Goal set today  Visit Information  Last OT Received On: 05/12/12 Assistance Needed: +1    Subjective Data  Subjective: I'm not feeling nearly as bad as I was Patient Stated Goal: Return home with husband   Prior Functioning  Home Living Lives With: Spouse Available Help at Discharge: Family;Available 24 hours/day Type of Home: House Home Access: Ramped entrance Home Layout: One level Bathroom Shower/Tub: Tub/shower unit;Curtain Firefighter: Standard Bathroom Accessibility: Yes How Accessible: Accessible via walker Home Adaptive Equipment: None Prior Function Level of Independence: Independent Able to Take Stairs?: Reciprically Driving: No Vocation:  Retired Musician: No difficulties Dominant Hand: Left    Cognition  Overall Cognitive Status: Appears within functional limits for tasks assessed/performed Arousal/Alertness: Awake/alert Orientation Level: Appears intact for tasks assessed Behavior During Session: [facility] for tasks  performed    Extremity/Trunk Assessment Right Upper Extremity Assessment RUE ROM/Strength/Tone: Within functional levels RUE Sensation: WFL - Light Touch RUE Coordination: WFL - gross/fine motor Left Upper Extremity Assessment LUE ROM/Strength/Tone: Within functional levels LUE Sensation: WFL - Light Touch LUE Coordination: WFL - gross/fine motor   Mobility Transfers Transfers: Sit to Stand;Stand to Sit Sit to Stand: 4: Min guard;From bed Stand to Sit: 4: Min assist;With armrests;To chair/3-in-1 Details for Transfer Assistance: VC for hand placement when sitting down   Exercise    Balance    End of Session OT - End of Session Equipment Utilized During Treatment: Gait belt Activity Tolerance: Patient tolerated treatment well Patient left: in chair;with call bell/phone within reach   Jakarius Flamenco 05/12/2012, 1:50 PM

## 2012-05-12 NOTE — Progress Notes (Signed)
Utilization review complete 

## 2012-05-12 NOTE — ED Provider Notes (Signed)
Medical screening examination/treatment/procedure(s) were conducted as a shared visit with non-physician practitioner(s) and myself.  I personally evaluated the patient during the encounter   Nat Christen, MD 05/12/12 0001

## 2012-05-12 NOTE — Discharge Instructions (Signed)
STOP taking the following medicines: robaxin, valium. Also, do not take metformin until 05/14/12 then take normally.  Start taking flexeril instead of robaxin for your neck pain. We think it may work better than the robaxin and it shouldn't cause dizziness.  We think you may be having room spinning/dizziness due to your pain and neck spasms, we hope this new medicine (flexeril) will improve the spasms and dizziness/room spinning.  Start taking meclizine every 6 hours for at least 24 hours then as needed.  Remember to use the walker and shower seat until your strength is improved.  Use exercises that physical therapy provided when you can tolerate them.

## 2012-05-12 NOTE — Progress Notes (Signed)
I interviewed and examined this patient and discussed the care plan with Dr. Durene Cal and the [facility] team and agree with assessment and plan as documented in the progress note for today.    Devon Pretty A. Sheffield Slider, MD Family Medicine Teaching Service Attending  05/12/2012 4:42 PM

## 2012-05-13 LAB — GLUCOSE, CAPILLARY
Glucose-Capillary: 113 mg/dL — ABNORMAL HIGH (ref 70–99)
Glucose-Capillary: 123 mg/dL — ABNORMAL HIGH (ref 70–99)

## 2012-05-13 NOTE — Progress Notes (Signed)
Physical Therapy Treatment   05/13/12 1051  PT Visit Information  Last PT Received On 05/13/12  Assistance Needed +1  PT Time Calculation  PT Start Time 1022  PT Stop Time 1048  PT Time Calculation (min) 26 min  Subjective Data  Subjective States headache down to 4/10  Patient Stated Goal Find out what is causing pain in Rt subclavian area; Rt neck; and stop being so dizzy  Precautions  Precautions Fall  PT - End of Session  Activity Tolerance Patient limited by pain  Patient left in bed;Other (comment) (with OT)  PT - Assessment/Plan  Comments on Treatment Session Performed modified Hallpike-Dix with hospital bed for Lt posterior canal BPPV and no vertigo or nystagmus elicited. Returned to supine and OT arrived and began assessing RUE numbness and Rt neck pain. ? thoracic outlet syndrome as pt very painful over Rt clavicle and subclavian area. Discussed need for manual PT on d/c and pt agreeable to OPPT. Discussed using  [facility] which is closer to pt's home.  PT Plan Discharge plan needs to be updated;Frequency remains appropriate  PT Frequency Min 5X/week  Follow Up Recommendations Outpatient PT;Supervision for mobility/OOB  Equipment Recommended None recommended by PT  Acute Rehab PT Goals  PT Goal Formulation With patient  Additional Goals  Additional Goal #1 Pt will be able to independently verbalize and demonstrate use of compensatory strategies to minimize vertigo while mobilizing.  PT Goal: Additional Goal #1 - Progress Discontinued (comment)  PT General Charges  $$ ACUTE PT VISIT 1 Procedure  PT Treatments  $Self Care/Home Management 8-22

## 2012-05-13 NOTE — Progress Notes (Signed)
Physical Therapy Treatment Patient Details Name: Destiny Savage MRN: [id number] DOB: Dec 07, 1950 Today's Date: 05/13/2012 Time: 4098-1191 PT Time Calculation (min): 20 min  PT Assessment / Plan / Recommendation Comments on Treatment Session  Pt with no vertigo or double vision this a.m. Her headache was 10/10 on arrival and RN notified and in to give pain meds. Deferred modified Hallpike-Dix using hospital bed until later this a.m. once headache subsides. Would like to reassess prior to d/c today.     Follow Up Recommendations  Home health PT;Supervision for mobility/OOB;Other (comment)    Barriers to Discharge        Equipment Recommendations  None recommended by PT    Recommendations for Other Services    Frequency Min 5X/week   Plan Discharge plan remains appropriate;Frequency remains appropriate    Precautions / Restrictions Precautions Precautions: Fall   Pertinent Vitals/Pain Headache 10/10; RN in to give meds; repositioned in bed after session    Mobility  Bed Mobility Bed Mobility: Rolling Right;Right Sidelying to Sit;Sitting - Scoot to [place] of Bed;Sit to Sidelying Right;Rolling Left Rolling Right: 7: Independent Right Sidelying to Sit: 6: Modified independent (Device/Increase time);HOB flat Sitting - Scoot to Edge of Bed: 7: Independent Sit to Sidelying Right: 6: Modified independent (Device/Increase time);HOB flat Details for Bed Mobility Assistance: No assist or cues needed; incr time due to neck pain; roll to Lt did not elicit vertigo or double vision Transfers Transfers: Sit to Stand;Stand to Sit Sit to Stand: 5: Supervision;With upper extremity assist;From bed;From toilet Stand to Sit: 5: Supervision;With upper extremity assist;To bed;To toilet Details for Transfer Assistance: supervision due to recent dizziness; none today Ambulation/Gait Ambulation/Gait Assistance: 4: Min guard Ambulation Distance (Feet): 100 Feet Assistive device: None Ambulation/Gait  Assistance Details: Much better balance today; no vertigo; + woozy feeling (likely due to flexeril she recently took) Gait Pattern: Within Functional Limits Gait velocity: severely decr    Exercises     PT Diagnosis:    PT Problem List:   PT Treatment Interventions:     PT Goals Acute Rehab PT Goals PT Goal Formulation: With patient Time For Goal Achievement: 05/15/12 Potential to Achieve Goals: Good Pt will go Supine/Side to Sit: with modified independence;with HOB 0 degrees;Other (comment) PT Goal: Supine/Side to Sit - Progress: Met Pt will go Sit to Supine/Side: with modified independence;with HOB 0 degrees;Other (comment) PT Goal: Sit to Supine/Side - Progress: Met Pt will go Sit to Stand: with supervision;with upper extremity assist;Other (comment) PT Goal: Sit to Stand - Progress: Met Pt will Ambulate: >150 feet;with supervision;with least restrictive assistive device;Other (comment) PT Goal: Ambulate - Progress: Progressing toward goal Additional Goals Additional Goal #1: Pt will be able to independently verbalize and demonstrate use of compensatory strategies to minimize vertigo while mobilizing. PT Goal: Additional Goal #1 - Progress: Not met  Visit Information  Last PT Received On: 05/13/12 Assistance Needed: +1    Subjective Data  Subjective: Reports she walked to the bathroom with assist with no spinning Patient Stated Goal: Find out what is causing pain in Rt subclavian area; Rt neck; and stop being so dizzy   Cognition  Overall Cognitive Status: Appears within functional limits for tasks assessed/performed Arousal/Alertness: Awake/alert Orientation Level: Appears intact for tasks assessed Behavior During Session: [facility] for tasks performed    Balance     End of Session PT - End of Session Equipment Utilized During Treatment: Gait belt Activity Tolerance: Patient limited by pain (headache ) Patient left: in bed;with call bell/phone  within reach;with bed alarm  set Nurse Communication: Mobility status   GP     Lonnie Reth 05/13/2012, 9:34 AM Pager [phone]

## 2012-05-13 NOTE — Progress Notes (Signed)
Patient discharge instructions and education given to patient and family. All questions answered to patient's satisfaction. Patient D/C home with no signs of acute distress.

## 2012-05-13 NOTE — Care Management Note (Signed)
    Page 1 of 1   05/13/2012     2:38:50 PM   CARE MANAGEMENT NOTE 05/13/2012  Patient:  Destiny Savage, Destiny Savage   Account Number:  [id number]  Date Initiated:  05/13/2012  Documentation initiated by:  Onnie Boer  Subjective/Objective Assessment:   PT WAS ADMITTED VERTIGO     Action/Plan:   PROGRESSION OF CARE AND DISCHARGE   Anticipated DC Date:  05/13/2012   Anticipated DC Plan:  HOME/SELF CARE      DC Planning Services  CM consult      Choice offered to / List presented to:  C-1 Patient   DME arranged  [facility]  3-N-1      DME agency  [facility]        Status of service:  Completed, signed off Medicare Important Message given?   (If response is "NO", the following Medicare IM given date fields will be blank) Date Medicare IM given:   Date Additional Medicare IM given:    Discharge Disposition:  HOME/SELF CARE  Per UR Regulation:  Reviewed for med. necessity/level of care/duration of stay  If discussed at Long Length of Stay Meetings, dates discussed:    Comments:   05/13/12 Onnie Boer, RN ,BSN 1412 PT IS GOING TO DC TO HOME WITH OP PT/OT WITH [facility] AS SHE LIVES IN [place] AND DME'S AS ABOVE.  PT HAS CHOSEN [facility] FOR DME'S.

## 2012-05-13 NOTE — Progress Notes (Signed)
Full OT note to follow.  Pt. With improvement in neck and UE pain 1/10 after myofascial release - tightness noted throughout Rt. Shoulder, neck and UE.  Recommend OP PT to continue to address soft tissue and pain issues.  Case manager aware.  [employer], OTR/L [phone]

## 2012-05-13 NOTE — Progress Notes (Signed)
Occupational Therapy Treatment Patient Details Name: Destiny Savage MRN: [id number] DOB: February 15, 1951 Today's Date: 05/13/2012 Time: 5284-1324 OT Time Calculation (min): 41 min  OT Assessment / Plan / Recommendation Comments on Treatment Session Pt. with significant improvement with pain in neck and Rt. UE -  pt. with full AROM neck at end of session with pt in supine.  Began instruction for HEP.  Recommend OPPT at [facility] - pt lives in [place]    Follow Up Recommendations  No OT follow up    Barriers to Discharge       Equipment Recommendations  None recommended by PT;None recommended by OT    Recommendations for Other Services    Frequency Min 2X/week   Plan Discharge plan remains appropriate    Precautions / Restrictions Precautions Precautions: Fall Restrictions Weight Bearing Restrictions: No   Pertinent Vitals/Pain     ADL  ADL Comments: Pt. very point tender over mid clavicle area and reports pain radiates to shoulder and neck.  She also reports impaired sensation to elbow, and to a lesser extent her arm.  She reports pain starte 5/16, and has not improved since then.  She reports she trimmed hedges overhead the day before.  Myofascial release performed to anterior shoulder complex , neck and UE.  Pt. reports pain decreased to 2-3/10.  with pt in supine a towel roll was placed along the length of her spine to allow self stretch of the thoracic spine and anterior musculature.  Pt. noted with noteable tightness Rt. shoulder - humeral head anterior.      OT Diagnosis:    OT Problem List:   OT Treatment Interventions:     OT Goals Acute Rehab OT Goals OT Goal Formulation: With patient Time For Goal Achievement: 05/15/12 Potential to Achieve Goals: Good ADL Goals Additional ADL Goal #1: Pt. will demonstrate pain Rt. shoulder and neck no greater than 3/10 during ADL activities ADL Goal: Additional Goal #1 - Progress: Goal set today  Visit Information  Last OT Received On:  05/13/12 Assistance Needed: +1    Subjective Data      Prior Functioning       Cognition  Overall Cognitive Status: Appears within functional limits for tasks assessed/performed Arousal/Alertness: Awake/alert Orientation Level: Appears intact for tasks assessed Behavior During Session: [facility] for tasks performed    Mobility     Exercises    Balance    End of Session OT - End of Session Activity Tolerance: Patient tolerated treatment well Patient left: in bed;with call bell/phone within reach  GO     [name] 05/13/2012, 5:17 PM

## 2012-05-13 NOTE — Clinical Social Work Psychosocial (Signed)
     Clinical Social Work Department BRIEF PSYCHOSOCIAL ASSESSMENT 05/13/2012  Patient:  Destiny Savage, Destiny Savage     Account Number:  [id number]     Admit date:  05/11/2012  Clinical Social Worker:  Margaree Mackintosh  Date/Time:  05/13/2012 03:00 PM  Referred by:  Physician  Date Referred:  05/13/2012 Referred for  Advanced Directives   Other Referral:   Interview type:  Patient Other interview type:   with spouse present.    PSYCHOSOCIAL DATA Living Status:  FAMILY Admitted from facility:   Level of care:   Primary support name:  [phone] Primary support relationship to patient:  SPOUSE Degree of support available:   Adequate.    CURRENT CONCERNS Current Concerns  Other - See comment   Other Concerns:   Advanced Directives.    SOCIAL WORK ASSESSMENT / PLAN Clinical Social Worker recieved referrral indicating that pt expressed interest in Advanced Directives.  CSW met with pt and spouse at bedside.  CSW confirmed pt's interest in [place] paperwork.  CSW reviewed HCPOA paperwork at length and addressed all questions/concerns.  Pt to review paperwork with spouse.  Pt stated that when she is ready to have paperwork notarized, she will inform CSW/medical staff.  CSW to sign off at this time, please re consult when pt is ready to have paperwork notarized.   Assessment/plan status:  Information/Referral to [facility] Other assessment/ plan:   Information/referral to community resources:   Advance Directives: HCPOA.    PATIENTS/FAMILYS RESPONSE TO PLAN OF CARE: Pt and spouse were pleasant and engaged in conversation. Pt and spouse both stated that all questions/concerns were addressed.  Pt and spouse thanked CSW for intervention.  Pt to inform CSW/medical staff when/if she is ready to have paperwork notarized.

## 2012-06-04 ENCOUNTER — Encounter: Payer: Self-pay | Admitting: Internal Medicine

## 2012-06-18 ENCOUNTER — Encounter: Payer: Self-pay | Admitting: Internal Medicine

## 2012-07-19 ENCOUNTER — Encounter: Payer: Self-pay | Admitting: Internal Medicine

## 2012-07-27 ENCOUNTER — Emergency Department: Payer: Self-pay | Admitting: Emergency Medicine

## 2012-08-06 ENCOUNTER — Ambulatory Visit: Payer: Self-pay | Admitting: Internal Medicine

## 2012-08-18 ENCOUNTER — Encounter: Payer: Self-pay | Admitting: Internal Medicine

## 2012-10-26 ENCOUNTER — Emergency Department: Payer: Self-pay | Admitting: Emergency Medicine

## 2012-10-26 LAB — URINALYSIS, COMPLETE
Bilirubin,UR: NEGATIVE
Ph: 5 (ref 4.5–8.0)
Protein: NEGATIVE
RBC,UR: NONE SEEN /HPF (ref 0–5)
Squamous Epithelial: NONE SEEN

## 2012-10-26 LAB — COMPREHENSIVE METABOLIC PANEL
BUN: 15 mg/dL (ref 7–18)
Bilirubin,Total: 0.6 mg/dL (ref 0.2–1.0)
Co2: 30 mmol/L (ref 21–32)
Creatinine: 0.76 mg/dL (ref 0.60–1.30)
EGFR (African American): >60
EGFR (Non-African Amer.): >60
SGOT(AST): 23 U/L (ref 15–37)
SGPT (ALT): 21 U/L (ref 12–78)
Sodium: 139 mmol/L (ref 136–145)

## 2012-10-26 LAB — CBC
Platelet: 246 10*3/uL (ref 150–440)
RDW: 13.9 % (ref 11.5–14.5)
WBC: 10.1 10*3/uL (ref 3.6–11.0)

## 2013-04-30 DIAGNOSIS — R109 Unspecified abdominal pain: Secondary | ICD-10-CM | POA: Insufficient documentation

## 2013-08-09 ENCOUNTER — Ambulatory Visit: Payer: Self-pay | Admitting: Internal Medicine

## 2013-11-01 ENCOUNTER — Emergency Department: Payer: Self-pay | Admitting: Emergency Medicine

## 2014-03-05 ENCOUNTER — Emergency Department: Payer: Self-pay | Admitting: Emergency Medicine

## 2014-03-05 LAB — CBC
HCT: 37.4 % (ref 35.0–47.0)
HGB: 12 g/dL (ref 12.0–16.0)
MCH: 29 pg (ref 26.0–34.0)
MCHC: 32.2 g/dL (ref 32.0–36.0)
MCV: 90 fL (ref 80–100)
Platelet: 251 10*3/uL (ref 150–440)
RBC: 4.16 10*6/uL (ref 3.80–5.20)
RDW: 13.7 % (ref 11.5–14.5)
WBC: 8.2 10*3/uL (ref 3.6–11.0)

## 2014-03-05 LAB — BASIC METABOLIC PANEL
ANION GAP: 6 — AB (ref 7–16)
BUN: 15 mg/dL (ref 7–18)
CALCIUM: 9.4 mg/dL (ref 8.5–10.1)
CHLORIDE: 105 mmol/L (ref 98–107)
Co2: 27 mmol/L (ref 21–32)
Creatinine: 0.84 mg/dL (ref 0.60–1.30)
EGFR (African American): >60
EGFR (Non-African Amer.): >60
GLUCOSE: 97 mg/dL (ref 65–99)
Osmolality: 276 (ref 275–301)
POTASSIUM: 3.6 mmol/L (ref 3.5–5.1)
Sodium: 138 mmol/L (ref 136–145)

## 2014-03-05 LAB — TROPONIN I
Troponin-I: <0.02 ng/mL
Troponin-I: <0.02 ng/mL

## 2014-03-28 ENCOUNTER — Emergency Department: Payer: Self-pay | Admitting: Emergency Medicine

## 2014-04-17 ENCOUNTER — Emergency Department: Payer: Self-pay | Admitting: Emergency Medicine

## 2014-04-17 LAB — CBC
HCT: 39 % (ref 35.0–47.0)
HGB: 12.5 g/dL (ref 12.0–16.0)
MCH: 29.3 pg (ref 26.0–34.0)
MCHC: 32.1 g/dL (ref 32.0–36.0)
MCV: 91 fL (ref 80–100)
PLATELETS: 251 10*3/uL (ref 150–440)
RBC: 4.27 10*6/uL (ref 3.80–5.20)
RDW: 13.9 % (ref 11.5–14.5)
WBC: 10.9 10*3/uL (ref 3.6–11.0)

## 2014-04-17 LAB — COMPREHENSIVE METABOLIC PANEL
ALT: 14 U/L (ref 12–78)
ANION GAP: 5 — AB (ref 7–16)
AST: 20 U/L (ref 15–37)
Albumin: 4 g/dL (ref 3.4–5.0)
Alkaline Phosphatase: 82 U/L
BUN: 14 mg/dL (ref 7–18)
Bilirubin,Total: 0.3 mg/dL (ref 0.2–1.0)
CALCIUM: 9.5 mg/dL (ref 8.5–10.1)
CO2: 28 mmol/L (ref 21–32)
Chloride: 105 mmol/L (ref 98–107)
Creatinine: 0.85 mg/dL (ref 0.60–1.30)
EGFR (African American): >60
EGFR (Non-African Amer.): >60
GLUCOSE: 141 mg/dL — AB (ref 65–99)
Osmolality: 279 (ref 275–301)
POTASSIUM: 3.9 mmol/L (ref 3.5–5.1)
Sodium: 138 mmol/L (ref 136–145)
Total Protein: 7.6 g/dL (ref 6.4–8.2)

## 2014-04-17 LAB — LIPASE, BLOOD: LIPASE: 144 U/L (ref 73–393)

## 2014-04-22 ENCOUNTER — Ambulatory Visit: Payer: Self-pay | Admitting: Internal Medicine

## 2014-07-10 ENCOUNTER — Inpatient Hospital Stay: Payer: Self-pay | Admitting: Internal Medicine

## 2014-07-10 LAB — COMPREHENSIVE METABOLIC PANEL
ALK PHOS: 76 U/L
Albumin: 3.5 g/dL (ref 3.4–5.0)
Anion Gap: 13 (ref 7–16)
BUN: 13 mg/dL (ref 7–18)
Bilirubin,Total: 0.4 mg/dL (ref 0.2–1.0)
Calcium, Total: 9.1 mg/dL (ref 8.5–10.1)
Chloride: 102 mmol/L (ref 98–107)
Co2: 24 mmol/L (ref 21–32)
Creatinine: 0.94 mg/dL (ref 0.60–1.30)
EGFR (African American): >60
EGFR (Non-African Amer.): >60
Glucose: 139 mg/dL — ABNORMAL HIGH (ref 65–99)
Osmolality: 280 (ref 275–301)
Potassium: 3.8 mmol/L (ref 3.5–5.1)
SGOT(AST): 19 U/L (ref 15–37)
SGPT (ALT): 16 U/L
Sodium: 139 mmol/L (ref 136–145)
TOTAL PROTEIN: 7.3 g/dL (ref 6.4–8.2)

## 2014-07-10 LAB — URINALYSIS, COMPLETE
Bilirubin,UR: NEGATIVE
Blood: NEGATIVE
GLUCOSE, UR: NEGATIVE mg/dL (ref 0–75)
Ketone: NEGATIVE
NITRITE: NEGATIVE
PROTEIN: NEGATIVE
Ph: 6 (ref 4.5–8.0)
RBC,UR: 2 /HPF (ref 0–5)
SPECIFIC GRAVITY: 1.006 (ref 1.003–1.030)
Transitional Epi: <1
WBC UR: 8 /HPF (ref 0–5)

## 2014-07-10 LAB — CBC
HCT: 40.4 % (ref 35.0–47.0)
HGB: 13.2 g/dL (ref 12.0–16.0)
MCH: 29.8 pg (ref 26.0–34.0)
MCHC: 32.7 g/dL (ref 32.0–36.0)
MCV: 91 fL (ref 80–100)
PLATELETS: 264 10*3/uL (ref 150–440)
RBC: 4.43 10*6/uL (ref 3.80–5.20)
RDW: 14.1 % (ref 11.5–14.5)
WBC: 11.8 10*3/uL — ABNORMAL HIGH (ref 3.6–11.0)

## 2014-07-10 LAB — LIPID PANEL
CHOLESTEROL: 191 mg/dL (ref 0–200)
HDL: 88 mg/dL — AB (ref 40–60)
Ldl Cholesterol, Calc: 82 mg/dL (ref 0–100)
TRIGLYCERIDES: 105 mg/dL (ref 0–200)
VLDL CHOLESTEROL, CALC: 21 mg/dL (ref 5–40)

## 2014-07-10 LAB — PROTIME-INR
INR: 1
Prothrombin Time: 12.8 secs (ref 11.5–14.7)

## 2014-07-10 LAB — CK TOTAL AND CKMB (NOT AT ARMC)
CK, TOTAL: 59 U/L
CK-MB: <0.5 ng/mL — ABNORMAL LOW (ref 0.5–3.6)

## 2014-07-10 LAB — LIPASE, BLOOD: Lipase: 1032 U/L — ABNORMAL HIGH (ref 73–393)

## 2014-07-11 LAB — LIPASE, BLOOD: Lipase: 227 U/L (ref 73–393)

## 2014-07-16 ENCOUNTER — Emergency Department: Payer: Self-pay | Admitting: Emergency Medicine

## 2014-08-17 ENCOUNTER — Ambulatory Visit: Payer: Self-pay | Admitting: Internal Medicine

## 2014-09-27 ENCOUNTER — Emergency Department: Payer: Self-pay | Admitting: Emergency Medicine

## 2014-09-27 LAB — LIPASE, BLOOD: LIPASE: 106 U/L (ref 73–393)

## 2014-09-27 LAB — COMPREHENSIVE METABOLIC PANEL
ALBUMIN: 3.8 g/dL (ref 3.4–5.0)
ALK PHOS: 89 U/L
ANION GAP: 8 (ref 7–16)
BUN: 11 mg/dL (ref 7–18)
Bilirubin,Total: 0.3 mg/dL (ref 0.2–1.0)
CO2: 29 mmol/L (ref 21–32)
CREATININE: 0.86 mg/dL (ref 0.60–1.30)
Calcium, Total: 9.4 mg/dL (ref 8.5–10.1)
Chloride: 105 mmol/L (ref 98–107)
EGFR (African American): >60
EGFR (Non-African Amer.): >60
Glucose: 98 mg/dL (ref 65–99)
Osmolality: 282 (ref 275–301)
Potassium: 4.3 mmol/L (ref 3.5–5.1)
SGOT(AST): 15 U/L (ref 15–37)
SGPT (ALT): 18 U/L
Sodium: 142 mmol/L (ref 136–145)
Total Protein: 7.4 g/dL (ref 6.4–8.2)

## 2014-09-27 LAB — CBC WITH DIFFERENTIAL/PLATELET
Basophil #: 0 10*3/uL (ref 0.0–0.1)
Basophil %: 0.3 %
EOS ABS: 0.3 10*3/uL (ref 0.0–0.7)
Eosinophil %: 1.9 %
HCT: 40.4 % (ref 35.0–47.0)
HGB: 13 g/dL (ref 12.0–16.0)
LYMPHS ABS: 1.1 10*3/uL (ref 1.0–3.6)
LYMPHS PCT: 8.1 %
MCH: 29.5 pg (ref 26.0–34.0)
MCHC: 32.1 g/dL (ref 32.0–36.0)
MCV: 92 fL (ref 80–100)
MONOS PCT: 4.1 %
Monocyte #: 0.6 x10 3/mm (ref 0.2–0.9)
NEUTROS PCT: 85.6 %
Neutrophil #: 11.9 10*3/uL — ABNORMAL HIGH (ref 1.4–6.5)
Platelet: 264 10*3/uL (ref 150–440)
RBC: 4.39 10*6/uL (ref 3.80–5.20)
RDW: 13.6 % (ref 11.5–14.5)
WBC: 13.9 10*3/uL — AB (ref 3.6–11.0)

## 2014-09-27 LAB — URINALYSIS, COMPLETE
BILIRUBIN, UR: NEGATIVE
Blood: NEGATIVE
Glucose,UR: NEGATIVE mg/dL (ref 0–75)
Ketone: NEGATIVE
Leukocyte Esterase: NEGATIVE
Nitrite: NEGATIVE
Ph: 5 (ref 4.5–8.0)
Protein: NEGATIVE
RBC,UR: 2 /HPF (ref 0–5)
SPECIFIC GRAVITY: 1.018 (ref 1.003–1.030)
Squamous Epithelial: 1

## 2014-09-27 LAB — TROPONIN I

## 2014-09-28 ENCOUNTER — Emergency Department: Payer: Self-pay | Admitting: Emergency Medicine

## 2014-09-28 LAB — TROPONIN I

## 2014-09-28 LAB — CBC WITH DIFFERENTIAL/PLATELET
BASOS PCT: 0.2 %
Basophil #: 0 10*3/uL (ref 0.0–0.1)
EOS PCT: 2.9 %
Eosinophil #: 0.3 10*3/uL (ref 0.0–0.7)
HCT: 37.7 % (ref 35.0–47.0)
HGB: 12.4 g/dL (ref 12.0–16.0)
LYMPHS PCT: 12.9 %
Lymphocyte #: 1.2 10*3/uL (ref 1.0–3.6)
MCH: 30 pg (ref 26.0–34.0)
MCHC: 32.8 g/dL (ref 32.0–36.0)
MCV: 92 fL (ref 80–100)
MONO ABS: 0.4 x10 3/mm (ref 0.2–0.9)
Monocyte %: 4.5 %
NEUTROS ABS: 7.6 10*3/uL — AB (ref 1.4–6.5)
Neutrophil %: 79.5 %
Platelet: 239 10*3/uL (ref 150–440)
RBC: 4.12 10*6/uL (ref 3.80–5.20)
RDW: 13.7 % (ref 11.5–14.5)
WBC: 9.5 10*3/uL (ref 3.6–11.0)

## 2014-09-28 LAB — LIPASE, BLOOD: Lipase: 77 U/L (ref 73–393)

## 2014-09-28 LAB — COMPREHENSIVE METABOLIC PANEL
ALT: 15 U/L
Albumin: 3.5 g/dL (ref 3.4–5.0)
Alkaline Phosphatase: 82 U/L
Anion Gap: 8 (ref 7–16)
BUN: 12 mg/dL (ref 7–18)
Bilirubin,Total: 0.3 mg/dL (ref 0.2–1.0)
CALCIUM: 8.8 mg/dL (ref 8.5–10.1)
CO2: 29 mmol/L (ref 21–32)
Chloride: 103 mmol/L (ref 98–107)
Creatinine: 0.95 mg/dL (ref 0.60–1.30)
EGFR (African American): >60
GLUCOSE: 110 mg/dL — AB (ref 65–99)
OSMOLALITY: 280 (ref 275–301)
Potassium: 4 mmol/L (ref 3.5–5.1)
SGOT(AST): 19 U/L (ref 15–37)
Sodium: 140 mmol/L (ref 136–145)
TOTAL PROTEIN: 7 g/dL (ref 6.4–8.2)

## 2014-10-21 ENCOUNTER — Emergency Department: Payer: Self-pay | Admitting: Emergency Medicine

## 2014-10-26 ENCOUNTER — Emergency Department: Payer: Self-pay | Admitting: Emergency Medicine

## 2014-10-27 LAB — COMPREHENSIVE METABOLIC PANEL
ALT: 16 U/L
AST: 23 U/L (ref 15–37)
Albumin: 3.9 g/dL (ref 3.4–5.0)
Alkaline Phosphatase: 100 U/L
Anion Gap: 11 (ref 7–16)
BUN: 13 mg/dL (ref 7–18)
Bilirubin,Total: 0.3 mg/dL (ref 0.2–1.0)
CALCIUM: 9.7 mg/dL (ref 8.5–10.1)
CHLORIDE: 103 mmol/L (ref 98–107)
CO2: 27 mmol/L (ref 21–32)
Creatinine: 0.96 mg/dL (ref 0.60–1.30)
Glucose: 132 mg/dL — ABNORMAL HIGH (ref 65–99)
Osmolality: 283 (ref 275–301)
POTASSIUM: 3.5 mmol/L (ref 3.5–5.1)
Sodium: 141 mmol/L (ref 136–145)
Total Protein: 7.9 g/dL (ref 6.4–8.2)

## 2014-10-27 LAB — CBC WITH DIFFERENTIAL/PLATELET
Basophil #: 0.1 10*3/uL (ref 0.0–0.1)
Basophil %: 0.7 %
EOS ABS: 0.2 10*3/uL (ref 0.0–0.7)
EOS PCT: 2 %
HCT: 42.5 % (ref 35.0–47.0)
HGB: 13.7 g/dL (ref 12.0–16.0)
LYMPHS ABS: 2.1 10*3/uL (ref 1.0–3.6)
Lymphocyte %: 17.1 %
MCH: 29.3 pg (ref 26.0–34.0)
MCHC: 32.2 g/dL (ref 32.0–36.0)
MCV: 91 fL (ref 80–100)
MONOS PCT: 2.3 %
Monocyte #: 0.3 x10 3/mm (ref 0.2–0.9)
NEUTROS ABS: 9.5 10*3/uL — AB (ref 1.4–6.5)
Neutrophil %: 77.9 %
Platelet: 326 10*3/uL (ref 150–440)
RBC: 4.68 10*6/uL (ref 3.80–5.20)
RDW: 13.6 % (ref 11.5–14.5)
WBC: 12.2 10*3/uL — ABNORMAL HIGH (ref 3.6–11.0)

## 2014-10-27 LAB — URINALYSIS, COMPLETE
Bilirubin,UR: NEGATIVE
Glucose,UR: NEGATIVE mg/dL (ref 0–75)
Hyaline Cast: 12
Nitrite: NEGATIVE
Ph: 5 (ref 4.5–8.0)
Protein: 100
RBC,UR: 112 /HPF (ref 0–5)
Specific Gravity: 1.029 (ref 1.003–1.030)
Squamous Epithelial: 20
WBC UR: 92 /HPF (ref 0–5)

## 2014-10-27 LAB — LIPASE, BLOOD: Lipase: 140 U/L (ref 73–393)

## 2015-02-19 ENCOUNTER — Emergency Department: Admit: 2015-02-19 | Disposition: A | Discharge status: Home / Self Care | Payer: Self-pay | Admitting: Internal Medicine

## 2015-02-19 LAB — URINALYSIS, COMPLETE
Bacteria: NONE SEEN
Bilirubin,UR: NEGATIVE
Blood: NEGATIVE
GLUCOSE, UR: NEGATIVE mg/dL (ref 0–75)
Hyaline Cast: 5
Nitrite: NEGATIVE
Ph: 5 (ref 4.5–8.0)
Protein: NEGATIVE
RBC,UR: 4 /HPF (ref 0–5)
Specific Gravity: 1.025 (ref 1.003–1.030)
Squamous Epithelial: 4

## 2015-02-19 LAB — COMPREHENSIVE METABOLIC PANEL
Albumin: 4.5 g/dL
Alkaline Phosphatase: 85 U/L
Anion Gap: 10 (ref 7–16)
BILIRUBIN TOTAL: 0.3 mg/dL
BUN: 15 mg/dL
CALCIUM: 10.1 mg/dL
CHLORIDE: 101 mmol/L
CO2: 28 mmol/L
CREATININE: 0.91 mg/dL
EGFR (African American): >60
EGFR (Non-African Amer.): >60
Glucose: 141 mg/dL — ABNORMAL HIGH
POTASSIUM: 4.4 mmol/L
SGOT(AST): 23 U/L
SGPT (ALT): 14 U/L
SODIUM: 139 mmol/L
Total Protein: 7.7 g/dL

## 2015-02-19 LAB — CBC WITH DIFFERENTIAL/PLATELET
Basophil #: 0.2 10*3/uL — ABNORMAL HIGH (ref 0.0–0.1)
Basophil %: 1.6 %
EOS PCT: 1.8 %
Eosinophil #: 0.2 10*3/uL (ref 0.0–0.7)
HCT: 42.1 % (ref 35.0–47.0)
HGB: 13.4 g/dL (ref 12.0–16.0)
Lymphocyte #: 0.9 10*3/uL — ABNORMAL LOW (ref 1.0–3.6)
Lymphocyte %: 9.1 %
MCH: 28.6 pg (ref 26.0–34.0)
MCHC: 31.7 g/dL — ABNORMAL LOW (ref 32.0–36.0)
MCV: 90 fL (ref 80–100)
Monocyte #: 0.6 x10 3/mm (ref 0.2–0.9)
Monocyte %: 5.9 %
NEUTROS PCT: 81.6 %
Neutrophil #: 7.8 10*3/uL — ABNORMAL HIGH (ref 1.4–6.5)
PLATELETS: 259 10*3/uL (ref 150–440)
RBC: 4.68 10*6/uL (ref 3.80–5.20)
RDW: 13.9 % (ref 11.5–14.5)
WBC: 9.6 10*3/uL (ref 3.6–11.0)

## 2015-02-19 LAB — LIPASE, BLOOD: Lipase: 50 U/L

## 2015-02-19 LAB — TROPONIN I

## 2015-03-11 NOTE — H&P (Signed)
PATIENT NAME:  Destiny Savage, Destiny Savage MR#:  [id number] DATE OF BIRTH:  1951-07-17  DATE OF ADMISSION:  07/10/2014  REFERRING PHYSICIAN:  Dr. Deborah Chalk.  PRIMARY CARE PHYSICIAN: Dr. Dewaine Oats.   CHIEF COMPLAINT: Abdominal pain.   HISTORY OF PRESENT ILLNESS: This is a 64 year old female with known history of hypertension, diabetes, hyperlipidemia, and history of TIA. The patient presents with complaints of abdominal pain x 3 days in the epigastric area burning sensation as well accompanied by nausea. Denies any vomiting, fevers, chills, diarrhea, constipation, no relieving no provoking factors. Per presentation, the patient required multiple doses of IV morphine, she had a basic blood work-up which did show elevated lipase more than 1000. The patient denies any history of alcohol abuse, reports she had cholecystectomy in 2010, reports having recurrent bouts of abdominal pain, which has been chronic for her over a few years with extensive work-up including capsule camera swallow a few years ago and endoscopy at [facility] in 2013. The patient is currently on budesonide treatment by [facility] which she seen last week with a tapering dose, she does not know why she is on it. She does not carry official diagnosis of inflammatory bowel disease. Currently, the patient reports that the chest pain is much improved after the morphine and hospitalist requested to admit the patient. The patient denies any chest pain, any shortness of breath, any hemoptysis, any melena, and diarrhea, constipation.   PAST MEDICAL HISTORY: Hypertension, diabetes, hypercholesterolemia and recurrent abdominal pain, a history of transient ischemic attack, GERD, obstructive sleep apnea and migraine.   PAST SURGICAL HISTORY: Cholecystectomy, hysterectomy, tubal ligation.   ALLERGIES: LIDOCAINE AND LATEX.   SOCIAL HISTORY: No smoking. No alcohol. No illicit drug use.   FAMILY HISTORY: Significant for hypertension and coronary artery disease.    REVIEW OF SYSTEMS: CONSTITUTIONAL: Denies fever, chills, fatigue, weakness.  EYES: Denies blurry vision, double vision, inflammation, glaucoma.  EARS, NOSE AND THROAT: Denies tinnitus, ear pain, hearing loss, epistaxis.  RESPIRATORY: Denies cough, wheezing, hemoptysis.  CARDIOVASCULAR: Denies chest pain, edema, palpitation, syncope.  GASTROINTESTINAL: Denies vomiting, diarrhea, hematemesis, coffee-ground emesis. Complains of abdominal pain and nausea.  GENITOURINARY: Denies dysuria, hematuria, or renal colic.  ENDOCRINE: Denies polyuria, polydipsia, heat or cold intolerance.  HEMATOLOGY: Denies anemia, easy bruising, bleeding diathesis.  INTEGUMENT: Denies acne, rash, or skin lesion.  MUSCULOSKELETAL: Denies any swelling, gout, cramps.  NEUROLOGIC: Denies CVA, migraines, tremors, vertigo, ataxia. Has history of TIA.  PSYCHIATRIC: Denies anxiety, insomnia, or depression.   PHYSICAL EXAMINATION:  VITAL SIGNS: Temperature 97.7, pulse 69, respiratory rate 18, blood pressure 145/83, saturating 96% on room air.  GENERAL: Obese female who looks comfortable in bed, in no apparent distress.  HEENT: Head is atraumatic, normocephalic. Pupils equal, reactive to light. Pink conjunctivae. Anicteric sclerae. Moist oral mucosa. No oral thrush.  NECK: Supple. No thyromegaly. No JVD. No carotid bruits. No cervical lymphadenopathy.  CHEST: Good air entry bilaterally. No wheezing, rales, rhonchi. No use of accessory muscles.  CARDIOVASCULAR: S1, S2 heard. No rubs, murmurs, gallops. PMI nondisplaced.  ABDOMEN: Obese diffuse tenderness to palpation. No rebound, no guarding. Bowel sounds present.  EXTREMITIES: No edema. No clubbing. No cyanosis. Pedal pulses +2 bilaterally.  PSYCHIATRIC: Appropriate affect. Awake, alert x 3. Intact judgment and insight.  NEUROLOGIC: Cranial nerves grossly intact. Motor 5/5. No focal deficits. Sensation is symmetrical and intact to light touch.  MUSCULOSKELETAL: No joint  effusion or erythema.  SKIN: Normal skin turgor. Warm and dry.   PERTINENT LABORATORY DATA: Glucose  139, BUN 13, creatinine 0.94, sodium 139, potassium 3.8, chloride 102, CO2 24, lipase 1,032. ALT 16, AST 19, alkaline phosphatase 76, CK-MB serum less than 0.05. White blood cells 11.8, hemoglobin 13.2, hematocrit 40.4, platelet 264, INR 1.   ASSESSMENT AND PLAN:  1. Acute pancreatitis. The patient presents with complaints of abdominal pain with elevated lipase level, denies any alcohol use. Has history of cholecystectomy. Etiology is unclear at this point. Will check lipid panel. We will obtain CT abdomen and pelvis with IV contrast for further evaluation. At this point it is unclear why the patient is on budesonide. [facility]. We will keep her n.p.o. We will keep him on p.r.n. nausea and pain medicine with aggressive hydration. We will advance diet once her nausea improves. We will consult gastroenterology.  2. Hypertension. Blood pressure is acceptable. Continue her on her home medication.  3. Diabetes mellitus. The patient is n.p.o. Will keep on her fingersticks and if needed we will start her on insulin sliding scale. We will hold her oral hypoglycemic agents especially metformin given the fact that she will be getting IV contrast.  4. History of hypercholesterolemia. Will resume statin. Will check lipid panel.  5. History of transient ischemic attack. We will resume Plavix and patient is more stable.  6. Gastroesophageal reflux disease. Continue with proton pump inhibitor. 7. Deep thrombosis prophylaxis. Subcutaneous heparin.   CODE STATUS: Full code.   TOTAL TIME SPENT ON ADMISSION AND PATIENT CARE: 55 minutes    ____________________________ Starleen Armsawood S. Deveion Denz, MD dse:JT D: 07/10/2014 04:25:06 ET T: 07/10/2014 04:46:57 ET JOB#: [id number]  cc: Starleen Armsawood S. Shaughn Thomley, MD, <Dictator> Tobe Kervin Teena IraniS Brogan Martis MD ELECTRONICALLY SIGNED 07/11/2014 6:47

## 2015-03-11 NOTE — Consult Note (Signed)
PATIENT NAME:  Destiny Savage, Destiny Savage MR#:  [id number] DATE OF BIRTH:  [birth year]  DATE OF CONSULTATION:  07/10/2014  CONSULTING PHYSICIAN:  Dow Adolph, MD  GASTROINTESTINAL INPATIENT CONSULTATION  REFERRING PHYSICIAN: Dr. Enedina Finner.  REASON FOR CONSULTATION: Pancreatitis.   HISTORY OF PRESENT ILLNESS: Destiny Savage is a 64 year old female with a past medical history notable for diabetes, TIA, OSA who presented to the Emergency Room for evaluation of abdominal pain. Destiny Savage reports that she first developed abdominal pain on Thursday, which would have been 3 days ago. This pain seemed to resolve its own but then again on Friday evening, 2 days ago, she developed again pain along with some nausea, some lightheadedness. This continued overnight and she presented to the Emergency Room for evaluation. In the Emergency Room, she was found to have an elevated lipase of about 1000. She also had a CT scan done in the Emergency Room, which did not show any evidence of pancreatitis. She is not aware of ever having pancreatitis prior to this.   In terms of pancreatitis risk factors she denies to me any alcohol or any tobacco. She does not appear to have any gallstones on her imaging studies and she denies any new medications other than budesonide recently.   PAST MEDICAL HISTORY:  1. OSA.  2. Transient ischemic attack.  3. Recurrent abdominal pain.  4. Hypertension.  5. Diabetes.  6. Hypercholesterolemia.   PAST SURGICAL HISTORY: She has had a cholecystectomy, hysterectomy, tubal ligation.   ALLERGIES: LIDOCAINE AND LATEX.   HOME MEDICATIONS: She is on budesonide. She does not know the rest of her medications.   SOCIAL HISTORY: She denies tobacco and alcohol to me.   FAMILY HISTORY: No family history of pancreatic disease. Also no family history of GI malignancy that she is aware of.   REVIEW OF SYSTEMS: A 10-system review was conducted and was negative except as stated in the HPI.   PHYSICAL  EXAMINATION:  VITAL SIGNS: Her temperature is 97.8, pulse is 74, respirations are 18, blood pressure 158/92, pulse oximetry is 99% on room air. GENERAL: Alert and oriented times 4.  No acute distress. Appears stated age. HEENT: Normocephalic/atraumatic. Extraocular movements are intact. Anicteric. NECK: Soft, supple. JVP appears normal. No adenopathy. CHEST: Clear to auscultation. No wheeze or crackle. Respirations unlabored. HEART: Regular. No murmur, rub, or gallop.  Normal S1 and S2. ABDOMEN: Diffuse mild tenderness to palpation right in the left lower quadrant and in the periumbilical area. No rebound or guarding. Normoactive bowel sounds, soft.  EXTREMITIES: No swelling, well perfused. SKIN: No rash or lesion. Skin color, texture, turgor normal. NEUROLOGICAL: Grossly intact. PSYCHIATRIC: Normal tone and affect. MUSCULOSKELETAL: No joint swelling or erythema.   LABORATORY DATA: Her basic metabolic panel is normal. Her glucose 139, triglycerides 105. Lipase is 1032. Liver enzymes are normal. Cardiac enzymes are normal. White count is 12, hemoglobin 13, hematocrit 40, platelets are 264,000. INR is 1.0.   She had a CT scan of her abdomen and pelvis, which showed no imaging evidence of pancreatitis, although she did have a probable pancreas divisum and she is status post cholecystectomy.   ASSESSMENT AND PLAN: Epigastric pain, elevated lipase: This is likely acute pancreatitis, although it is unusual that there is no evidence of inflammation on the CT scan. This would suggest that it is very mild case of pancreatitis. Fortunately, she seems to be improving significantly clinically at this time. I do not have an etiology for this does not seem to be  alcohol, gallstones, medication induced.  RECOMMENDATIONS:  1. IgG4 blood test.  2. Will advance diet to clear liquids given she is doing well clinically.  3. I will follow up with her in the outpatient setting for consideration of MRCP. This is to  evaluate this possible pancreas divisum. However, this would not be an indication for an ERCP at this time with only 1 mild episode of pancreatitis.   Thank you for this consult.    ____________________________ Dow AdolphMatthew Rein, MD mr:lt D: 07/10/2014 16:29:57 ET T: 07/10/2014 17:59:02 ET JOB#: [id number]  cc: Dow AdolphMatthew Rein, MD, <Dictator> Kathalene FramesMATTHEW G REIN MD ELECTRONICALLY SIGNED 07/24/2014 17:02

## 2015-03-11 NOTE — Discharge Summary (Signed)
PATIENT NAME:  Destiny Savage, Destiny Savage MR#:  [id number] DATE OF BIRTH:  [birth year]  DATE OF ADMISSION:  07/10/2014  DATE OF DISCHARGE:  07/11/2014  PRESENTING COMPLAINT: Abdominal pain.   DISCHARGE DIAGNOSES:  1. Acute mild pancreatitis, resolved.  2. 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   3. Hypertension.  4. Chronic abdominal pain.   CONDITION ON DISCHARGE: Fair.   MEDICATIONS: 1. Simvastatin 40 mg daily.  2. Omeprazole 20 mg daily.  3. Carvedilol 6.25 b.i.d.  4. Metformin 500 b.i.d.  5. Plavix 75 mg daily.  6. Multivitamin p.o. daily.  7. Onglyza 2.5 mg p.o. daily.  8. Albuterol 2 puffs 4 times a day as needed.  9. Promethazine 25 mg 1 to 2 q. 4 to 6 p.r.n.  10. Budesonide 3 mg oral capsule extended release p.o. 2 tablets daily.  11. Tramadol 50 mg every 4 hours as needed.  12. Follow up with [facility] in 1 to 2 weeks.  13. Follow up with Dr. Arlana Pouchate in 1 to 2 weeks.  14. The patient advised to get follow with [facility] and get MRCP for pancreas divisum. GI consultation Ryan.  15. Lipase on discharge is 227.  16. Immunoglobulin G subclasses within normal limits.  17. Urinalysis negative for urinary tract infection.  18. CT of the abdomen, pelvis shows no imaging evidence of pancreatitis, probable pancreatic divisum.  19. Cholecystectomy.   LABORATORY DATA: Lipase at admission was 1032.   Destiny Savage is a 64 year old African American female with chronic abdominal pain and a history of diabetes, comes in with:   1. Acute pancreatitis, mild, etiology unclear. She presents with complaint of abdominal pain with elevated lipase. Denies any alcohol use has history of cholecystectomy. Has history of chronic abdominal pain and has been on for possible questionable intravenous pyelogram follows up [facility]. CT of abdomen showed normal pancreas with probable pancreas divisum. MRCP is outpatient per Dr. Coral Elseyan'Savage recommendation [facility].  Lipase was around 1000, came down to 237 and was  she was tolerating p.o. diet well.  2. Hypertension. Blood pressure is stable. Home medications were continued.  3. Hypercholesterolemia, resume statins.  4. History of transient ischemic attack, on Plavix.  5. Gastroesophageal reflux disease. Continue Protonix. 8. Hospital stay otherwise remained stable.   CODE STATUS: The patient remained a full code.   TIME SPENT: 40 minutes.    ____________________________ Wylie HailSona A. Allena KatzPatel, MD sap:JT D: 07/14/2014 19:20:00 ET T: 07/15/2014 04:47:03 ET JOB#: [id number]  cc: Fransisca Shawn A. Allena KatzPatel, MD, <Dictator> Willow OraSONA A Ryson Bacha MD ELECTRONICALLY SIGNED 07/26/2014 14:50

## 2015-03-12 NOTE — H&P (Signed)
PATIENT NAME:  Rhea BeltonMURRAY, Almeta S MR#:  [id number] DATE OF BIRTH:  01/15/1951  DATE OF ADMISSION:  01/30/2012  ER REFERRING PHYSICIAN: Daryel NovemberJonathan Williams, MD  PRIMARY CARE PHYSICIAN: Dewaine Oatsenny Tate, MD  CHIEF COMPLAINT: Abdominal pain.   HISTORY OF PRESENT ILLNESS: The patient is a 64 year old female with past medical history of chronic abdominal pain and chronic dyspepsia who [facility], Dr. Theodore DemarkWild. The patient was [facility] 2011 for C. difficile colitis and acute renal failure. The patient was in her usual state of health when she woke up this morning and developed left lower quadrant pain. The patient reported that she has been constipated recently and was prescribed some stool softeners by her primary physician. Since then she has been having more than one stool per day. She denies any diarrhea, however, in the ED she was found to be positive for C. difficile. The patient did not have a repeat CAT scan because she has had multiple CAT scans in the past for her chronic abdominal pain. [facility] in January 2013, which showed thickening of multiple loops of small bowel predominantly in the right abdomen and central abdomen without evidence of a bowel perforation. There was evidence of enteritis. At that time, she was discharged home from the emergency room. Today the patient has leukocytosis with white count of 16.7 and is being admitted to the hospitalist service for further management.   ALLERGIES: Lidocaine, GI cocktail, latex.  PAST MEDICAL HISTORY:  1. Chronic abdominal pain and dyspepsia. 2. Diabetes. 3. Hypertension. 4. Hyperlipidemia. 5. Gastroesophageal reflux disease. 6. [facility] for Clostridium difficile colitis.  7. Leukocytosis.  8. Acute on chronic renal failure.   MEDICATIONS: The patient did not bring a list of her medications. As per prescription writer, she is supposed to be taking the  following. 1. Zofran 8 mg three times daily as needed.  2. Omeprazole 20 mg twice a day. 3. Aspirin 81 mg daily.  4. Lisinopril 20 mg daily.  5. Metformin 1000 mg once a day. 6. Percocet 10/325 mg three times daily p.r.n.  7. Simvastatin 40 mg daily.  SOCIAL HISTORY: The patient denies any history of smoking, alcohol, or drug abuse. She works at a school.   FAMILY HISTORY: Positive for hypertension.  PAST SURGICAL HISTORY:  1. Cholecystectomy.  2. Hysterectomy.  3. Tubal ligation. 4. Cyst removal in the left arm.  REVIEW OF SYSTEMS: CONSTITUTIONAL: Denies any fever or fatigue. EYES: Denies any blurred or double vision. ENT: Denies any tinnitus or ear pain. RESPIRATORY: Denies any cough or wheezing. CARDIOVASCULAR: Denies any chest or palpitations. GASTROINTESTINAL: Denies any nausea or vomiting. Reports abdominal pain. GU: Denies any dysuria or hematuria. ENDOCRINE: Denies any polyuria or nocturia. HEME/LYMPH: Denies any anemia or easy bruisability. INTEGUMENT: Denies any acne or rash. MUSCULOSKELETAL: Denies any swelling or gout. NEUROLOGICAL: Denies any numbness or weakness. PSYCH: Denies any anxiety or weakness.   PHYSICAL EXAMINATION:   VITAL SIGNS: Temperature 97.7, heart rate 96, respiratory rate 18, blood pressure 121/83, pulse 98% on room air.   GENERAL: The patient is a 64 year old female lying in bed in some distress.   HEAD: Atraumatic, normocephalic.   EYES: There is some pallor. No icterus or cyanosis. Pupils are equal, round, and reactive to light and accommodation. Extraocular movements intact.   ENT: Dry mucous membranes. No oropharyngeal erythema or thrush.   NECK: Supple. No masses. No JVD or thyromegaly.  No lymphadenopathy.  CHEST WALL: No tenderness to palpation. Not using accessory muscles of respiration. No intercostal muscle retractions.   LUNGS: Bilaterally clear to auscultation. No wheezing, rales, or rhonchi.   CARDIOVASCULAR: S1 and S2 regular. No  murmur, rubs, or gallops.   ABDOMEN: The patient had some epigastric and left lower quadrant tenderness. No guarding or rigidity.  Mildly distended. Normal bowel sounds. No evidence of organomegaly.   SKIN: No rashes or lesions.   PERIPHERIES: No pedal edema. 2+ pedal pulses.   MUSCULOSKELETAL: No cyanosis or clubbing.   NEUROLOGIC: Awake, alert, and oriented x3. Nonfocal neurological exam.   PSYCH: Normal mood and affect.  RESULTS: Stool is positive for C. difficile.   Abdominal x-ray shows no acute abnormality.   White count 17.2, hemoglobin 15.6, hematocrit 47.2, platelets 276. Lipase 55, glucose 167, BUN 13, creatinine 0.83. The rest of the electrolytes are normal. Anion gap 18.  The patient's last CT scan from 11/19/2011 showed some evidence of enteritis.   ASSESSMENT AND PLAN: A 64 year old female with past medical history of diabetes, hypertension, hyperlipidemia, chronic abdominal pain, and dyspepsia who follows up at [facility] presents with sudden onset of severe abdominal pain. She has been taking a stool softener and has been having bowel movements more often than usual.  1. Clostridium difficile colitis: She had C. difficile colitis in 2007. Also her stool currently is positive for C. difficile. We will place on contact isolation. We will start the patient on oral vancomycin and on a clear liquid diet. If the patient's condition does not improve with conservative management, we will consider obtaining a repeat CAT scan of the abdomen, although the patient has had multiple x-ray exposure/CAT scans due to her chronic abdominal pain. 2. Diabetes:  Since the patient is going to be on a clear liquid diet, we will place her on an insulin sliding scale, hold her metformin, and place her on a no concentrated diet.  3. Hypertension: Appears to be well controlled at present. We will continue lisinopril.  4. Hyperlipidemia: We will continue the patient's simvastatin.  5. History of  gastroesophageal reflux disease: In view of C. difficile colitis, we will place the patient on Zantac and PPI.  6. We will place on GI and DVT prophylaxis.  I reviewed all medical records, discussed with the ED physician, and discussed with the patient the plan of care and management.  TIME SPENT: 75 minutes. ____________________________ Darrick Meigs, MD sp:slb D: 01/30/2012 20:39:15 ET T: 01/31/2012 07:29:50 ET JOB#: [id number]  cc: Darrick Meigs, MD, <Dictator> Jillene Bucks. Arlana Pouch, MD Darrick Meigs MD ELECTRONICALLY SIGNED 01/31/2012 17:33

## 2015-03-12 NOTE — Discharge Summary (Signed)
PATIENT NAME:  Destiny Savage, Destiny Savage MR#:  [id number] DATE OF BIRTH:  1951-08-05  DATE OF ADMISSION:  03/08/2012 DATE OF DISCHARGE:  03/09/2012  PRIMARY CARE PHYSICIAN: Dr. Dewaine Oats  REASON FOR ADMISSION: Perioral and bilateral hand numbness, some slurred speech, facial droop, expressive aphasia as well as lip and throat swelling.   DISCHARGE DIAGNOSES:  1. Transient ischemic attack, symptoms resolved, no residual deficits.  2. Urinary tract infection.  3. Hypertension. 4. Hyperlipidemia. 5. Type 2 diabetes mellitus.  6. History of recurrent abdominal pain.   CONSULTATIONS: None.   DISCHARGE DISPOSITION: Home.    DISCHARGE MEDICATIONS:  1. Plavix 75 mg p.o. daily.  2. Coreg 6.25 mg p.o. b.i.d.  3. Cipro 500 mg p.o. b.i.d. x5 days.  4. Ondansetron 8 mg p.o. t.i.d. p.r.n. nausea, vomiting. 5. Percocet 10/325, 1 tablet p.o. t.i.d. p.r.n. pain.  6. Simvastatin 40 mg p.o. daily.  7. Omeprazole 20 mg p.o. daily.  8. Budesonide extended release 3 mg p.o. daily.  9. Metformin 500 mg p.o. daily.    DISCHARGE CONDITION: Improved, stable.  DISCHARGE ACTIVITY: As tolerated.   DISCHARGE DIET: Low sodium, ADA, low fat, low cholesterol.   DISCHARGE INSTRUCTIONS:  1. Take medications as prescribed.   2. Return to Emergency Department for recurrence of symptoms or for development of any numbness, weakness, tingling, difficulty speaking, drooping of the face, headache, blurry vision or confusion.  3. Do not take aspirin since you have been started on Plavix.  4. Do not take lisinopril since you had lip and throat swelling, although angioedema is felt to be less likely and symptoms were felt to be likely due to transient ischemic attack.   FOLLOW UP INSTRUCTIONS: 1. Follow up with Dr. Dewaine Oats within 1 to 2 weeks.  2. Follow up with your gastroenterologist within 2 to 3 weeks.    LABORATORY, DIAGNOSTIC AND RADIOLOGICAL DATA:  Noncontrast head CT 03/08/2012: No acute intracranial  abnormalities are noted.   Chest x-ray PA and lateral 03/08/2012: Blunting of left costophrenic angle, otherwise no acute cardiopulmonary abnormalities are noted. Cardiac silhouette is within upper limits of normal. No focal regions of consolidation or focal infiltrates are appreciated.   Bilateral carotid artery Doppler ultrasound 03/08/2012: No sonographic evidence of hemodynamically significant stenosis within the right or left carotid system. Antegrade flow is noted in both vertebral arteries.   Brain MRI without contrast 03/08/2012: No acute abnormalities are noted.   2-D echocardiogram 04//21/2013: LV is grossly normal size. No thrombus. LV systolic function normal. Ejection fraction greater than 55%. Normal LV wall thickness and wall motion. RV systolic function is normal.   EKG on admission: Normal sinus rhythm, heart rate 66 beats per minute, normal axis, normal intervals, no acute ST-T wave changes.   Urinalysis on admission with 2+ leukocyte esterase, negative nitrite, 7 WBCs, no bacteria.  Urine culture 03/08/2012: No growth to date.   CBC normal on admission.   Cardiac enzymes normal on admission.    Complete metabolic panel normal on admission.   Hemoglobin A1c 6.3.   Lipid panel: Total cholesterol 157, triglycerides 118, HDL 62, LDL 71.   BRIEF HISTORY/HOSPITAL COURSE: Patient is a pleasant 64 year old female with past medical history of hypertension, hypercholesterolemia, type 2 diabetes mellitus, recurrent abdominal pain with extensive work-up in the past and possible enteritis versus microscopic colitis who presented to the Emergency Department with transient expressive aphasia, mild right facial droop, slurred speech, perioral numbness and bilateral hand numbness as well as some throat and lip  swelling. Please see dictated admission history and physical for pertinent details surrounding the onset of this hospitalization. Please see below for further details. 1. Transient  ischemic attack-Presenting with transient expressive aphasia, mild right facial droop, slurred speech, perioral numbness and bilateral hand numbness and patient was on aspirin therapy at the time of admission and was felt to have experienced a transient ischemic attack which had failed aspirin. Therapy was escalated to Plavix after discussing the risks and benefits and alternatives and patient verbalized her understanding of the risks associated including bleeding and she was in agreement and desired to take Plavix. She was also maintained on statin therapy. She will need aggressive risk factor modification. Essentially stroke work-up was negative with negative head CT, brain MRI, carotid Doppler and telemetry monitoring. Hyperlipidemia and diabetes mellitus appear to be well controlled with LDL 71 and hemoglobin A1c 6.3. Her blood pressure is also at goal. Symptoms have resolved and she has no residual deficits at time of discharge. She will need close outpatient follow-up with her primary care physician. She does not require any outpatient rehab services as she is asymptomatic.  2. Urinary tract infection-Based off abnormal urinalysis and urine culture was obtained. She likely has uncomplicated cystitis for which she is started on antibiotic therapy with Cipro and was recommended a total five day course. She has been afebrile and without leukocytosis and nontoxic appearing and urine culture did not reveal any growth to date.  3. Hypercholesterolemia-Patient to continue statin. LDL is at goal.  4. Type 2 diabetes mellitus-Well controlled with hemoglobin A1c of 6.3. Patient to resume metformin upon hospital discharge.  5. Hypertension-At the time of admission her lisinopril was stopped as it was unclear whether she was having angioedema given lip and throat swelling versus symptoms of transient ischemic attack. In order to keep patient protected we have not restarted lisinopril during this hospitalization and  she will not be discharged home on this medication and she was started on Coreg instead and blood pressure is well controlled and at goal at the time of discharge. She will need close outpatient follow up with her primary care physician for hypertension management.  6. History of recurrent abdominal pain-For which patient has had extensive work-up at this hospital and also at alternate facilities including CAT scans and endoscopies with biopsies. She also history of small bowel wall thickening. She underwent upper endoscopy January 2013 and colonoscopy in either November or December of last year per the patient and at this time her exact diagnosis and etiology of pain are unclear but she follows up with her primary gastroenterologist and she has been placed on budesonide and it is unsure whether she has some sort of enteritis or microscopic colitis but she stated that this medication did not seem to be helping but she was without any abdominal pain during my evaluation and was without any nausea or vomiting, was tolerating a normal consistency diet well without any complications. For now she was advised to continue her budesonide as advised by her primary gastroenterologist and was advised to follow up with gastroenterologist closely upon hospital discharge.  7. She has remained hemodynamically stable during the entirety of this hospitalization. On 03/09/2012 patient was hemodynamically stable and without any aphasia, slurred speech, facial droop, numbness, tingling, weakness, lip or throat swelling, or any abdominal pain and was felt to be stable for discharge home with close outpatient follow up to which patient was agreeable.  TIME SPENT ON DISCHARGE: Greater than 30 minutes.  ____________________________ Elon Alas, MD knl:cms D: 03/12/2012 16:58:57 ET T: 03/13/2012 10:44:08 ET JOB#: [id number]  cc: Elon Alas, MD, <Dictator> Jillene Bucks Arlana Pouch, MD Elon Alas MD ELECTRONICALLY SIGNED  03/17/2012 18:52

## 2015-03-12 NOTE — H&P (Signed)
PATIENT NAME:  Destiny Savage, ROBILLARD MR#:  [id number] DATE OF BIRTH:  1951/07/09  DATE OF ADMISSION:  03/08/2012  PRIMARY CARE PHYSICIAN: Dr. Arlana Pouch   CHIEF COMPLAINT: Fullness in the head, bilateral hand numbness, and numbness and swelling of lips and throat.   HISTORY OF PRESENT ILLNESS: Destiny Savage is a 64 year old pleasant African American female with past medical history of hypertension, diabetes mellitus, and hypercholesterolemia. She woke up around 2 a.m. in the morning with a feeling of fullness in the head associated with bilateral hand numbness. She also reported a swelling feeling in the lips and throat area. After that she tried to drink some water and she reported that she spilled the water from the right corner of her mouth. Additionally, she had some problem in processing her thoughts and to verbalize but that was temporary and after a couple of hours this had improved by the time she came to the Emergency Department. The episode started more than four and a half hours ago. Her symptoms by now had subsided. She denied having any weakness in her arms or legs. No double vision or blurring of vision. The patient reported no prior history of such an episode.   REVIEW OF SYSTEMS: CONSTITUTIONAL: Denies any fever. No chills. No night sweats. EYES: No blurring of vision. No double vision. ENT: No hearing impairment. No sore throat or dysphagia. Reported some swelling feeling in the throat area and the lips. CARDIOVASCULAR: No chest pain. No shortness of breath. No syncope. RESPIRATORY: No shortness of breath. No chest pain. No cough. GASTROINTESTINAL: Reports recurrent chronic abdominal pain. She had extensive work-up as an outpatient. Last episode was a few days ago but none today. No vomiting. No diarrhea. GENITOURINARY: No dysuria. No frequency of urination. MUSCULOSKELETAL: No joint pain or swelling. No muscular pain or swelling. INTEGUMENTARY: No skin rash. No ulcers. NEUROLOGY: No focal weakness. No  seizure activity. No headache other than the fullness in the head that has subsided. Reported numbness in both hands. This also had subsided. PSYCHIATRY: No history of anxiety. No depression. ENDOCRINE: No polyuria or polydipsia. No heat or cold intolerance.   PAST MEDICAL HISTORY:  1. History of systemic hypertension.  2. History of diabetes mellitus, type II.  3. History of hypercholesterolemia. 4. History of recurrent abdominal pain. She had work-up including CAT scan of the abdomen at this hospital and different hospitals. There was some note about small bowel wall thickening. She had also upper endoscopy in January of this year and colonoscopy in either November or December of last year.   PAST SURGICAL HISTORY:  1. History of cholecystectomy.  2. Hysterectomy.  3. Tubal ligation.   ADMISSION MEDICATIONS:  1. Aspirin 81 mg a day. 2. Lisinopril 20 mg a day. 3. Metformin 500 mg once a day. 4. Omeprazole 20 mg a day. 5. Zofran 8 mg 3 times a day p.r.n.  6. Simvastatin 40 mg a day.  7. Budesonide 3 mg once a day.   ALLERGIES: Lidocaine and latex.   SOCIAL HABITS: Nonsmoker. No history of alcohol or drug abuse.   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 She lives with her husband.   FAMILY HISTORY: Her sister had hypertension. Her father died at the age of 106 from heart attack. Her mother is still living.   PHYSICAL EXAMINATION:   VITAL SIGNS: Blood pressure 144/84, respiratory rate 20, pulse 83, temperature 97.5, oxygen saturation 100%.   GENERAL APPEARANCE: Elderly female laying  in bed in no acute distress.   HEAD: No pallor. No icterus. No cyanosis.   EARS, NOSE, AND THROAT: Hearing was normal. Nasal mucosa, lips, tongue were normal.   EYES: Normal eyelids and conjunctivae. Pupils about 4 mm, equal and reactive to light.   NECK: Supple. Trachea at midline. No thyromegaly. No cervical lymphadenopathy. No masses.   HEART:  Normal S1, S2. No S3, S4. No murmur. No gallop. No carotid bruits.   RESPIRATORY: Normal breathing pattern without use of accessory muscles. No rales. No wheezing.   ABDOMEN: Soft without tenderness. No hepatosplenomegaly. No masses. No hernias.   SKIN: No ulcers. No subcutaneous nodules.   MUSCULOSKELETAL: No joint swelling. No clubbing.   NEUROLOGIC: Cranial nerves II through XII are intact. No focal motor deficit. Coordination movements were normal. Speech and swallowing were normal.   PSYCHIATRIC: The patient is alert and oriented x3. Mood and affect were normal.   LABORATORY FINDINGS: EKG showed normal sinus rhythm at rate of 63 per minute. Unremarkable EKG.  CAT scan of the head showed normal findings.   Serum glucose 125, BUN 12, creatinine 0.7, sodium 142, potassium 3.5. Liver function tests were normal. Total CPK 63. Troponin less than 0.02. Urinalysis was unremarkable except for +2 leukocyte esterase and 7 white blood cells. CBC was not done.   ASSESSMENT:  1. Bilateral hand numbness associated with some swelling feeling or perhaps it was numbness in her lips and tongue and temporary except expressive aphasia. Her symptoms have resolved by now. Findings and history is consistent with transient ischemic attack.  2. History of systemic hypertension.  3. Diabetes mellitus, type II.  4. History of hypercholesterolemia.  5. History of recurrent abdominal pain. She had outpatient work-up but patient reports that they did not have conclusive diagnosis. The patient was placed on oral steroid for that purpose.  6. History of hysterectomy.  7. Cholecystectomy.   PLAN:  1. Admit the patient to the medical floor with frequent neurologic examinations and follow-up.  2. Increase aspirin to 325 mg a day.  3. Obtain echocardiogram to rule out any embolic source for her TIA.  4. Carotid ultrasound.  5. Place the patient on Accu-Cheks.  6. I kept lisinopril on hold since the patient  reported some feeling of swelling of lips and closing of the throat area. I am not sure if this is a result of her neurologic event or complex presentation with ACE inhibitor that can cause angioedema. I will keep it on hold and replace that with beta-blocker for the time being.  7. I will send urine for culture since there is suggestion of possible underlying urinary tract infection.   The patient reported that she does not have a LIVING WILL or advanced directives, however, she reported that in the event that medical decision is needed her husband is appointed as Management consultantdecision maker.   TIME SPENT EVALUATING THIS PATIENT: More than 50 minutes.    ____________________________ Carney CornersAmir M. Rudene Rearwish, MD amd:drc D: 03/08/2012 06:47:41 ET T: 03/08/2012 10:07:41 ET JOB#: [id number]  cc: Carney CornersAmir M. Rudene Rearwish, MD, <Dictator> Jillene Bucksenny C. Arlana Pouchate, MD Carney CornersAMIR M Marcella Charlson MD ELECTRONICALLY SIGNED 03/08/2012 22:19

## 2015-03-12 NOTE — Discharge Summary (Signed)
PATIENT NAME:  Destiny Savage, Destiny Savage MR#:  [id number] DATE OF BIRTH:  [birth year]  DATE OF ADMISSION:  01/30/2012 DATE OF DISCHARGE:  02/02/2012  For a detailed note, please see the history and physical done by Dr. Dava NajjarPanwar on admission.   DIAGNOSES AT DISCHARGE:  Clostridium difficile colitis, improving. Abdominal pain, due to colitis, much improved and resolved. Diabetes.  Hypertension.  Hyperlipidemia.  Gastroesophageal reflux disease.   DISCHARGE DIET:  The patient is being discharged on an [employer] mechanical soft diet. The patient has also been advised to eat yogurt at least twice daily if possible.   ACTIVITY: As tolerated.   FOLLOW-UP: With Dr. Dennison MascotLemont Morrisey in the next 1 to 2 weeks.  DISCHARGE MEDICATIONS: 1. Zofran 8 mg t.i.d. as needed.  2. Aspirin 81 mg daily.  3. Lisinopril 20 mg daily.  4. Metformin 1000 mg daily. 5. Percocet 10/325, 1 tab t.i.d. as needed.  6. Simvastatin 40 mg daily.  7. Omeprazole 20 mg daily.  8. Budesonide 3 mg two caps daily.  9. Vancocin 500 mg every six hours x7 days.   PERTINENT STUDIES: Abdominal flat and erect done showing no evidence of any acute intra-abdominal pathology. Stool C. difficile toxin noted to be positive.   HOSPITAL COURSE: This is a 64 year old female with medical problems as mentioned above who presented to the hospital with abdominal pain.   PROBLEM 1: Clostridium difficile colitis. This was likely the cause of patient'Savage abdominal pain. The patient was started on p.o. vancomycin as she has had a history of previous C. difficile. She did not have any further diarrhea. There was no other imaging study done as after being treated with p.o. vancomycin, the patient'Savage clinical symptoms had dramatically improved. Her diet was initially on a clear liquid which was slowly advanced to eventually a full and eventually a soft diet which she has been able to tolerate without any further evidence of abdominal pain, or  diarrhea.   PROBLEM 2: Abdominal pain that was secondary to her C. difficile colitis and has significantly improved and now resolved with treatment with p.o. antibiotics.   PROBLEM 3: Diabetes. The patient was maintained on some sliding scale insulin during hospital course. Her metformin was held, although she can resume that upon discharge.  PROBLEM 4: Hypertension. The patient was maintained on lisinopril. She will resume that.   PROBLEM 5: Hyperlipidemia. The patient is to get her simvastatin. She will resume that.   PROBLEM 6: Gastroesophageal reflux disease. The patient was maintained on omeprazole and she will resume that upon discharge.   CODE STATUS: The patient is a full code.   TIME SPENT ON DISCHARGE: 40 minutes    ____________________________ Rolly PancakeVivek J. Cherlynn KaiserSainani, MD vjs:tbf D: 02/02/2012 17:05:49 ET T: 02/04/2012 09:52:29 ET JOB#: [id number]  cc: Rolly PancakeVivek J. Cherlynn KaiserSainani, MD, <Dictator> Dennison MascotLemont Morrisey, MD Houston SirenVIVEK J SAINANI MD ELECTRONICALLY SIGNED 02/05/2012 13:37

## 2015-12-09 ENCOUNTER — Emergency Department
Admission: EM | Admit: 2015-12-09 | Discharge: 2015-12-09 | Discharge status: Against Medical Advice | Payer: Medicare Other | Attending: Emergency Medicine | Admitting: Emergency Medicine

## 2015-12-09 ENCOUNTER — Encounter: Payer: Self-pay | Admitting: Emergency Medicine

## 2015-12-09 DIAGNOSIS — R110 Nausea: Secondary | ICD-10-CM | POA: Diagnosis not present

## 2015-12-09 DIAGNOSIS — I10 Essential (primary) hypertension: Secondary | ICD-10-CM | POA: Insufficient documentation

## 2015-12-09 DIAGNOSIS — E119 Type 2 diabetes mellitus without complications: Secondary | ICD-10-CM | POA: Insufficient documentation

## 2015-12-09 DIAGNOSIS — R109 Unspecified abdominal pain: Secondary | ICD-10-CM | POA: Diagnosis not present

## 2015-12-09 LAB — COMPREHENSIVE METABOLIC PANEL
ALT: 12 U/L — ABNORMAL LOW (ref 14–54)
ANION GAP: 11 (ref 5–15)
AST: 17 U/L (ref 15–41)
Albumin: 4.3 g/dL (ref 3.5–5.0)
Alkaline Phosphatase: 78 U/L (ref 38–126)
BILIRUBIN TOTAL: 0.7 mg/dL (ref 0.3–1.2)
BUN: 10 mg/dL (ref 6–20)
CALCIUM: 10.4 mg/dL — AB (ref 8.9–10.3)
CO2: 26 mmol/L (ref 22–32)
Chloride: 103 mmol/L (ref 101–111)
Creatinine, Ser: 0.69 mg/dL (ref 0.44–1.00)
GFR calc Af Amer: >60 mL/min (ref >60)
Glucose, Bld: 102 mg/dL — ABNORMAL HIGH (ref 65–99)
Potassium: 4 mmol/L (ref 3.5–5.1)
Sodium: 140 mmol/L (ref 135–145)
Total Protein: 7.8 g/dL (ref 6.5–8.1)

## 2015-12-09 LAB — URINALYSIS COMPLETE WITH MICROSCOPIC (ARMC ONLY)
Bilirubin Urine: NEGATIVE
Glucose, UA: NEGATIVE mg/dL
Hgb urine dipstick: NEGATIVE
KETONES UR: NEGATIVE mg/dL
Nitrite: NEGATIVE
PROTEIN: NEGATIVE mg/dL
Specific Gravity, Urine: 1.024 (ref 1.005–1.030)
pH: 5 (ref 5.0–8.0)

## 2015-12-09 LAB — CBC
HCT: 40 % (ref 35.0–47.0)
Hemoglobin: 13.1 g/dL (ref 12.0–16.0)
MCH: 28.7 pg (ref 26.0–34.0)
MCHC: 32.8 g/dL (ref 32.0–36.0)
MCV: 87.6 fL (ref 80.0–100.0)
Platelets: 277 10*3/uL (ref 150–440)
RBC: 4.57 MIL/uL (ref 3.80–5.20)
RDW: 14.1 % (ref 11.5–14.5)
WBC: 10.6 10*3/uL (ref 3.6–11.0)

## 2015-12-09 LAB — LIPASE, BLOOD: Lipase: 19 U/L (ref 11–51)

## 2015-12-09 MED ORDER — ONDANSETRON 4 MG PO TBDP
4.0000 mg | ORAL_TABLET | Freq: Once | ORAL | Status: AC | PRN
  Administered 2015-12-09: 4 mg via ORAL

## 2015-12-09 MED ORDER — ONDANSETRON 4 MG PO TBDP
ORAL_TABLET | ORAL | Status: AC
  Administered 2015-12-09: 4 mg via ORAL
  Filled 2015-12-09: qty 1

## 2015-12-09 NOTE — ED Notes (Signed)
Patient presents to the ED with centralized abdominal pain and nausea that began about 40 minutes prior to arrival.  Patient reports having similar pain and nausea episodes since 2005.  Patient denies ever being given a diagnosis.  Patient appears very nauseous at this time.  Patient denies drinking any alcohol.  Patient states she was eating a hamburger when she started having pain and nausea.  Patient denies vomiting and diarrhea at this time.

## 2015-12-14 ENCOUNTER — Emergency Department
Admission: EM | Admit: 2015-12-14 | Discharge: 2015-12-14 | Disposition: A | Discharge status: Home / Self Care | Payer: Medicare Other | Attending: Student | Admitting: Student

## 2015-12-14 DIAGNOSIS — H578 Other specified disorders of eye and adnexa: Secondary | ICD-10-CM | POA: Diagnosis present

## 2015-12-14 DIAGNOSIS — R202 Paresthesia of skin: Secondary | ICD-10-CM | POA: Diagnosis not present

## 2015-12-14 DIAGNOSIS — I10 Essential (primary) hypertension: Secondary | ICD-10-CM | POA: Diagnosis not present

## 2015-12-14 DIAGNOSIS — R51 Headache: Secondary | ICD-10-CM | POA: Diagnosis not present

## 2015-12-14 DIAGNOSIS — H109 Unspecified conjunctivitis: Secondary | ICD-10-CM | POA: Insufficient documentation

## 2015-12-14 DIAGNOSIS — Z91040 Latex allergy status: Secondary | ICD-10-CM | POA: Diagnosis not present

## 2015-12-14 DIAGNOSIS — J029 Acute pharyngitis, unspecified: Secondary | ICD-10-CM | POA: Diagnosis not present

## 2015-12-14 DIAGNOSIS — E119 Type 2 diabetes mellitus without complications: Secondary | ICD-10-CM | POA: Diagnosis not present

## 2015-12-14 DIAGNOSIS — Z79899 Other long term (current) drug therapy: Secondary | ICD-10-CM | POA: Diagnosis not present

## 2015-12-14 DIAGNOSIS — R05 Cough: Secondary | ICD-10-CM | POA: Diagnosis not present

## 2015-12-14 DIAGNOSIS — Z7984 Long term (current) use of oral hypoglycemic drugs: Secondary | ICD-10-CM | POA: Insufficient documentation

## 2015-12-14 DIAGNOSIS — H9209 Otalgia, unspecified ear: Secondary | ICD-10-CM | POA: Insufficient documentation

## 2015-12-14 MED ORDER — SULFACETAMIDE SODIUM 10 % OP SOLN: 2.0000 [drp] | Freq: Four times a day (QID) | OPHTHALMIC | Status: DC

## 2015-12-14 NOTE — ED Notes (Signed)
On the way to church tonight and she noticed her right eye started swelling and draining. Is unaware of anything getting into her eye and is itchy. Right eye is notably swollen and is draining clear fluid. Conjunctiva is red and swollen as well.

## 2015-12-14 NOTE — ED Notes (Signed)
Visual acuity L 20/20 R20/25

## 2015-12-14 NOTE — ED Provider Notes (Signed)
[facility] Emergency Department Provider Note  ____________________________________________  Time seen: Approximately 10:33 PM  I have reviewed the triage vital signs and the nursing notes.   HISTORY  Chief Complaint Eye Drainage   HPI Destiny Savage is a 65 y.o. female who presents with right eye itchiness and drainage. She states that this evening and went to church, she developed itchiness, followed by pain and drainage. She also reports readings around lights when looking at them. She endorses headache, sore throat, and cough, but denies other visual changes.She states that she has never had symptoms like these before.   Past Medical History  Diagnosis Date  . Diabetes mellitus   . Hypertension   . Stroke (HCC)   . Arthritis     Patient Active Problem List   Diagnosis Date Noted  . Vertigo 05/11/2012  . Hypertension 05/11/2012  . Diabetes type 2, controlled (HCC) 05/11/2012  . Neck pain on right side 05/11/2012    Past Surgical History  Procedure Laterality Date  . Abdominal hysterectomy    . Cholecystectomy      Current Outpatient Rx  Name  Route  Sig  Dispense  Refill  . carvedilol (COREG) 6.25 MG tablet   Oral   Take 6.25 mg by mouth 2 (two) times daily with a meal.         . clopidogrel (PLAVIX) 75 MG tablet   Oral   Take 75 mg by mouth daily.         . metFORMIN (GLUCOPHAGE-XR) 500 MG 24 hr tablet   Oral   Take 1 tablet (500 mg total) by mouth daily with breakfast. Do not start taking again until 05/14/12         . omeprazole (PRILOSEC) 20 MG capsule   Oral   Take 20 mg by mouth daily.         . simvastatin (ZOCOR) 40 MG tablet   Oral   Take 40 mg by mouth every evening.         . sulfacetamide (BLEPH-10) 10 % ophthalmic solution   Both Eyes   Place 2 drops into both eyes 4 (four) times daily.   5 mL   0     Allergies Latex and Lidocaine  No family history on file.  Social History Social History   Substance Use Topics  . Smoking status: Never Smoker   . Smokeless tobacco: None  . Alcohol Use: No    Review of Systems Constitutional: No fever/chills Eyes: Positive halos around light, no other visual changes. Positive eye redness, itchiness, irritation, and drainage. ENT: Positive sore throat. Positive ear pain. Cardiovascular: Denies chest pain. Respiratory: Denies shortness of breath. Positive cough Gastrointestinal: No abdominal pain.  No nausea, no vomiting.  No diarrhea.  No constipation. Neurological: Negative for headaches. No focal weakness, positive paresthesias  10-point ROS otherwise negative.  ____________________________________________   PHYSICAL EXAM:  VITAL SIGNS: ED Triage Vitals  Enc Vitals Group     BP 12/14/15 2151 149/82 mmHg     Pulse Rate 12/14/15 2151 72     Resp 12/14/15 2151 18     Temp 12/14/15 2151 98.3 F (36.8 C)     Temp Source 12/14/15 2151 Oral     SpO2 12/14/15 2151 97 %     Weight 12/14/15 2152 220 lb (99.791 kg)     Height 12/14/15 2152  (1.676 m)     Head Cir --      Peak Flow --  Pain Score --      Pain Loc --      Pain Edu? --      Excl. in GC? --     Constitutional: Alert and oriented. Well appearing and in no acute distress. Eyes: Conjunctivae of the right eye is injected. Clear discharge noted with some mucopurulent drainage of the right eye. PERRL. EOMI. ENT: TMs intact bilaterally, no swelling, no erythema Head: Atraumatic. Mouth/Throat: Mucous membranes are moist.  Oropharynx non-erythematous. Cardiovascular: Normal rate, regular rhythm. Grossly normal heart sounds.  Good peripheral circulation. Respiratory: Normal respiratory effort.  No retractions. Lungs CTAB. GI: Patient denies nausea, vomiting, constipation, but endorses some diarrhea one week ago Neurologic:  Normal speech and language. No gross focal neurologic deficits are appreciated.  Skin:  Skin is warm, dry and intact.  Psychiatric: Mood and  affect are normal. Speech and behavior are normal.  ____________________________________________   LABS (all labs ordered are listed, but only abnormal results are displayed)  Labs Reviewed - No data to display    PROCEDURES  Procedure(s) performed: None  Critical Care performed: No  ____________________________________________   INITIAL IMPRESSION / ASSESSMENT AND PLAN / ED COURSE  Pertinent labs & imaging results that were available during my care of the patient were reviewed by me and considered in my medical decision making (see chart for details).  Patient will be treated for acute bacterial conjunctivitis. She was given a prescription for Bleph-10, 1-2 drops in the affected eye 4 times daily 1 week, which she can fill at her pharmacy which is until midnight. She was advised about handwashing and hygiene practice. She has no other questions or urgent medical concerns. ____________________________________________   FINAL CLINICAL IMPRESSION(S) / ED DIAGNOSES  Diagnosis: Acute Bacterial Conjunctivitis    Evangeline Dakin, PA-C 12/14/15 1610  Gayla Doss, MD 12/15/15 [phone]

## 2015-12-14 NOTE — ED Notes (Signed)
Patient name and DOB verified and matches name bracelet.

## 2015-12-14 NOTE — Discharge Instructions (Signed)
Bacterial Conjunctivitis °Bacterial conjunctivitis, commonly called pink eye, is an inflammation of the clear membrane that covers the white part of the eye (conjunctiva). The inflammation can also happen on the underside of the eyelids. The blood vessels in the conjunctiva become inflamed, causing the eye to become red or pink. Bacterial conjunctivitis may spread easily from one eye to another and from person to person (contagious).  °CAUSES  °Bacterial conjunctivitis is caused by bacteria. The bacteria may come from your own skin, your upper respiratory tract, or from someone else with bacterial conjunctivitis. °SYMPTOMS  °The normally white color of the eye or the underside of the eyelid is usually pink or red. The pink eye is usually associated with irritation, tearing, and some sensitivity to light. Bacterial conjunctivitis is often associated with a thick, yellowish discharge from the eye. The discharge may turn into a crust on the eyelids overnight, which causes your eyelids to stick together. If a discharge is present, there may also be some blurred vision in the affected eye. °DIAGNOSIS  °Bacterial conjunctivitis is diagnosed by your caregiver through an eye exam and the symptoms that you report. Your caregiver looks for changes in the surface tissues of your eyes, which may point to the specific type of conjunctivitis. A sample of any discharge may be collected on a cotton-tip swab if you have a severe case of conjunctivitis, if your cornea is affected, or if you keep getting repeat infections that do not respond to treatment. The sample will be sent to a lab to see if the inflammation is caused by a bacterial infection and to see if the infection will respond to antibiotic medicines. °TREATMENT  °1. Bacterial conjunctivitis is treated with antibiotics. Antibiotic eyedrops are most often used. However, antibiotic ointments are also available. Antibiotics pills are sometimes used. Artificial tears or eye  washes may ease discomfort. °HOME CARE INSTRUCTIONS  °1. To ease discomfort, apply a cool, clean washcloth to your eye for 10-20 minutes, 3-4 times a day. °2. Gently wipe away any drainage from your eye with a warm, wet washcloth or a cotton ball. °3. Wash your hands often with soap and water. Use paper towels to dry your hands. °4. Do not share towels or washcloths. This may spread the infection. °5. Change or wash your pillowcase every day. °6. You should not use eye makeup until the infection is gone. °7. Do not operate machinery or drive if your vision is blurred. °8. Stop using contact lenses. Ask your caregiver how to sterilize or replace your contacts before using them again. This depends on the type of contact lenses that you use. °9. When applying medicine to the infected eye, do not touch the edge of your eyelid with the eyedrop bottle or ointment tube. °SEEK IMMEDIATE MEDICAL CARE IF:  °· Your infection has not improved within 3 days after beginning treatment. °· You had yellow discharge from your eye and it returns. °· You have increased eye pain. °· Your eye redness is spreading. °· Your vision becomes blurred. °· You have a fever or persistent symptoms for more than 2-3 days. °· You have a fever and your symptoms suddenly get worse. °· You have facial pain, redness, or swelling. °MAKE SURE YOU:  °· Understand these instructions. °· Will watch your condition. °· Will get help right away if you are not doing well or get worse. °  °This information is not intended to replace advice given to you by your health care provider. Make sure you   discuss any questions you have with your health care provider. °  °Document Released: 11/04/2005 Document Revised: 11/25/2014 Document Reviewed: 04/06/2012 °Elsevier Interactive Patient Education ©2016 [employer] ° °How to Use Eye Drops and Eye Ointments °HOW TO APPLY EYE DROPS °Follow these steps when applying eye drops: °2. Wash your hands. °3. Tilt your head  back. °4. Put a finger under your eye and use it to gently pull your lower lid downward. Keep that finger in place. °5. Using your other hand, hold the dropper between your thumb and index finger. °6. Position the dropper just over the edge of the lower lid. Hold it as close to your eye as you can without touching the dropper to your eye. °7. Steady your hand. One way to do this is to lean your index finger against your brow. °8. Look up. °9. Slowly and gently squeeze one drop of medicine into your eye. °10. Close your eye. °11. Place a finger between your lower eyelid and your nose. Press gently for 2 minutes. This increases the amount of time that the medicine is exposed to the eye. It also reduces side effects that can develop if the drop gets into the bloodstream through the nose. °HOW TO APPLY EYE OINTMENTS °Follow these steps when applying eye ointments: °10. Wash your hands. °11. Put a finger under your eye and use it to gently pull your lower lid downward. Keep that finger in place. °12. Using your other hand, place the tip of the tube between your thumb and index finger with the remaining fingers braced against your cheek or nose. °13. Hold the tube just over the edge of your lower lid without touching the tube to your lid or eyeball. °14. Look up. °15. Line the inner part of your lower lid with ointment. °16. Gently pull up on your upper lid and look down. This will force the ointment to spread over the surface of the eye. °17. Release the upper lid. °18. If you can, close your eyes for 1-2 minutes. °Do not rub your eyes. If you applied the ointment correctly, your vision will be blurry for a few minutes. This is normal. °ADDITIONAL INFORMATION °· Make sure to use the eye drops or ointment as told by your health care provider. °· If you have been told to use both eye drops and an eye ointment, apply the eye drops first, then wait 3-4 minutes before you apply the ointment. °· Try not to touch the tip of the  dropper or tube to your eye. A dropper or tube that has touched the eye can become contaminated. °  °This information is not intended to replace advice given to you by your health care provider. Make sure you discuss any questions you have with your health care provider. °  °Document Released: 02/10/2001 Document Revised: 03/21/2015 Document Reviewed: 10/31/2014 °Elsevier Interactive Patient Education ©2016 [employer] ° °

## 2015-12-23 ENCOUNTER — Emergency Department: Payer: Medicare Other

## 2015-12-23 ENCOUNTER — Observation Stay
Admission: EM | Admit: 2015-12-23 | Discharge: 2015-12-24 | Disposition: A | Discharge status: Home / Self Care | Payer: Medicare Other | Attending: Internal Medicine | Admitting: Internal Medicine

## 2015-12-23 ENCOUNTER — Encounter: Payer: Self-pay | Admitting: Emergency Medicine

## 2015-12-23 DIAGNOSIS — Z884 Allergy status to anesthetic agent status: Secondary | ICD-10-CM | POA: Diagnosis not present

## 2015-12-23 DIAGNOSIS — Z90710 Acquired absence of both cervix and uterus: Secondary | ICD-10-CM | POA: Diagnosis not present

## 2015-12-23 DIAGNOSIS — I071 Rheumatic tricuspid insufficiency: Secondary | ICD-10-CM | POA: Diagnosis not present

## 2015-12-23 DIAGNOSIS — Z79899 Other long term (current) drug therapy: Secondary | ICD-10-CM | POA: Insufficient documentation

## 2015-12-23 DIAGNOSIS — M1990 Unspecified osteoarthritis, unspecified site: Secondary | ICD-10-CM | POA: Diagnosis not present

## 2015-12-23 DIAGNOSIS — R42 Dizziness and giddiness: Secondary | ICD-10-CM | POA: Diagnosis not present

## 2015-12-23 DIAGNOSIS — I10 Essential (primary) hypertension: Secondary | ICD-10-CM | POA: Diagnosis present

## 2015-12-23 DIAGNOSIS — Z794 Long term (current) use of insulin: Secondary | ICD-10-CM | POA: Insufficient documentation

## 2015-12-23 DIAGNOSIS — K219 Gastro-esophageal reflux disease without esophagitis: Secondary | ICD-10-CM | POA: Diagnosis not present

## 2015-12-23 DIAGNOSIS — Z8673 Personal history of transient ischemic attack (TIA), and cerebral infarction without residual deficits: Secondary | ICD-10-CM | POA: Diagnosis not present

## 2015-12-23 DIAGNOSIS — Z8249 Family history of ischemic heart disease and other diseases of the circulatory system: Secondary | ICD-10-CM | POA: Insufficient documentation

## 2015-12-23 DIAGNOSIS — Z9049 Acquired absence of other specified parts of digestive tract: Secondary | ICD-10-CM | POA: Diagnosis not present

## 2015-12-23 DIAGNOSIS — M542 Cervicalgia: Secondary | ICD-10-CM | POA: Insufficient documentation

## 2015-12-23 DIAGNOSIS — Z91040 Latex allergy status: Secondary | ICD-10-CM | POA: Insufficient documentation

## 2015-12-23 DIAGNOSIS — E119 Type 2 diabetes mellitus without complications: Secondary | ICD-10-CM

## 2015-12-23 DIAGNOSIS — R079 Chest pain, unspecified: Principal | ICD-10-CM | POA: Diagnosis present

## 2015-12-23 DIAGNOSIS — Z7902 Long term (current) use of antithrombotics/antiplatelets: Secondary | ICD-10-CM | POA: Diagnosis not present

## 2015-12-23 DIAGNOSIS — E785 Hyperlipidemia, unspecified: Secondary | ICD-10-CM | POA: Diagnosis not present

## 2015-12-23 DIAGNOSIS — Z8619 Personal history of other infectious and parasitic diseases: Secondary | ICD-10-CM | POA: Insufficient documentation

## 2015-12-23 HISTORY — DX: Gastro-esophageal reflux disease without esophagitis: K21.9

## 2015-12-23 LAB — BASIC METABOLIC PANEL
ANION GAP: 9 (ref 5–15)
BUN: 13 mg/dL (ref 6–20)
CO2: 30 mmol/L (ref 22–32)
Calcium: 9.6 mg/dL (ref 8.9–10.3)
Chloride: 101 mmol/L (ref 101–111)
Creatinine, Ser: 0.8 mg/dL (ref 0.44–1.00)
GLUCOSE: 136 mg/dL — AB (ref 65–99)
POTASSIUM: 3.4 mmol/L — AB (ref 3.5–5.1)
Sodium: 140 mmol/L (ref 135–145)

## 2015-12-23 LAB — CBC
HEMATOCRIT: 37.4 % (ref 35.0–47.0)
HEMOGLOBIN: 12.2 g/dL (ref 12.0–16.0)
MCH: 29.4 pg (ref 26.0–34.0)
MCHC: 32.5 g/dL (ref 32.0–36.0)
MCV: 90.3 fL (ref 80.0–100.0)
Platelets: 280 10*3/uL (ref 150–440)
RBC: 4.14 MIL/uL (ref 3.80–5.20)
RDW: 14.1 % (ref 11.5–14.5)
WBC: 13 10*3/uL — ABNORMAL HIGH (ref 3.6–11.0)

## 2015-12-23 LAB — TROPONIN I

## 2015-12-23 MED ORDER — ASPIRIN 81 MG PO CHEW
324.0000 mg | CHEWABLE_TABLET | Freq: Once | ORAL | Status: AC
  Administered 2015-12-23: 324 mg via ORAL
  Filled 2015-12-23: qty 4

## 2015-12-23 MED ORDER — NITROGLYCERIN 2 % TD OINT
0.5000 [in_us] | TOPICAL_OINTMENT | TRANSDERMAL | Status: AC
  Administered 2015-12-23: 0.5 [in_us] via TOPICAL
  Filled 2015-12-23: qty 1

## 2015-12-23 NOTE — H&P (Signed)
[facility] - [place] [address] [facility]   PATIENT NAME: Destiny Savage    MR#:  [id number]  DATE OF BIRTH:  1951-10-04  DATE OF ADMISSION:  12/23/2015  PRIMARY CARE PHYSICIAN: Jaclyn Shaggy, MD   REQUESTING/REFERRING PHYSICIAN: Fanny Bien, MD  CHIEF COMPLAINT:   Chief Complaint  Patient presents with  . Chest Pain    HISTORY OF PRESENT ILLNESS:  Destiny Savage  is a 65 y.o. female who presents with chest pressure.  Patient was walking earlier today when discomfort started.  Central pressure, without radiation, with associated mild diaphoresis and SOB.  Improved with rest and worse again with movement.  Improved with nitroglycerin.  Preliminary workup here is neg, but hospitalists were called for further evaluation and admission.  PAST MEDICAL HISTORY:   Past Medical History  Diagnosis Date  . Diabetes mellitus   . Hypertension   . Stroke (HCC)   . Arthritis   . GERD (gastroesophageal reflux disease)     PAST SURGICAL HISTORY:   Past Surgical History  Procedure Laterality Date  . Abdominal hysterectomy    . Cholecystectomy      SOCIAL HISTORY:   Social History  Substance Use Topics  . Smoking status: Never Smoker   . Smokeless tobacco: Not on file  . Alcohol Use: No    FAMILY HISTORY:   Family History  Problem Relation Age of Onset  . Hypertension    . CAD      DRUG ALLERGIES:   Allergies  Allergen Reactions  . Latex Hives  . Lidocaine Hives    MEDICATIONS AT HOME:   Prior to Admission medications   Medication Sig Start Date End Date Taking? Authorizing Provider  carvedilol (COREG) 6.25 MG tablet Take 6.25 mg by mouth 2 (two) times daily with a meal.   Yes Historical Provider, MD  clopidogrel (PLAVIX) 75 MG tablet Take 75 mg by mouth daily.   Yes Historical Provider, MD  omeprazole (PRILOSEC) 20 MG capsule Take 20 mg by mouth daily.   Yes Historical Provider, MD  simvastatin (ZOCOR) 40 MG tablet Take 40 mg by mouth every evening.    Yes Historical Provider, MD  sitaGLIPtin (JANUVIA) 100 MG tablet Take 100 mg by mouth daily.   Yes Historical Provider, MD  sulfacetamide (BLEPH-10) 10 % ophthalmic solution Place 2 drops into both eyes 4 (four) times daily.   Yes Historical Provider, MD    REVIEW OF SYSTEMS:  Review of Systems  Constitutional: Positive for diaphoresis. Negative for fever, chills, weight loss and malaise/fatigue.  HENT: Negative for ear pain, hearing loss and tinnitus.   Eyes: Negative for blurred vision, double vision, pain and redness.  Respiratory: Positive for shortness of breath. Negative for cough and hemoptysis.   Cardiovascular: Positive for chest pain. Negative for palpitations, orthopnea and leg swelling.  Gastrointestinal: Negative for nausea, vomiting, abdominal pain, diarrhea and constipation.  Genitourinary: Negative for dysuria, frequency and hematuria.  Musculoskeletal: Negative for back pain, joint pain and neck pain.  Skin:       No acne, rash, or lesions  Neurological: Negative for dizziness, tremors, focal weakness and weakness.  Endo/Heme/Allergies: Negative for polydipsia. Does not bruise/bleed easily.  Psychiatric/Behavioral: Negative for depression. The patient is not nervous/anxious and does not have insomnia.      VITAL SIGNS:   Filed Vitals:   12/23/15 1927 12/23/15 2133 12/23/15 2158  BP: 152/85 143/81 142/81  Pulse: 75 72 69  Temp: 98.3 F (36.8 C)    TempSrc:  Oral    Resp: 18 18 18   Height: 5\' 7"  (1.702 m)    Weight: 100.245 kg (221 lb)    SpO2: 100% 97% 99%   Wt Readings from Last 3 Encounters:  12/23/15 100.245 kg (221 lb)  12/14/15 99.791 kg (220 lb)  12/09/15 99.791 kg (220 lb)    PHYSICAL EXAMINATION:  Physical Exam  Vitals reviewed. Constitutional: She is oriented to person, place, and time. She appears well-developed and well-nourished. No distress.  HENT:  Head: Normocephalic and atraumatic.  Mouth/Throat: Oropharynx is clear and moist.  Eyes:  Conjunctivae and EOM are normal. Pupils are equal, round, and reactive to light. No scleral icterus.  Neck: Normal range of motion. Neck supple. No JVD present. No thyromegaly present.  Cardiovascular: Normal rate, regular rhythm and intact distal pulses.  Exam reveals no gallop and no friction rub.   No murmur heard. Respiratory: Effort normal and breath sounds normal. No respiratory distress. She has no wheezes. She has no rales.  GI: Soft. Bowel sounds are normal. She exhibits no distension. There is no tenderness.  Musculoskeletal: Normal range of motion. She exhibits no edema.  No arthritis, no gout  Lymphadenopathy:    She has no cervical adenopathy.  Neurological: She is alert and oriented to person, place, and time. No cranial nerve deficit.  No dysarthria, no aphasia  Skin: Skin is warm and dry. No rash noted. No erythema.  Psychiatric: She has a normal mood and affect. Her behavior is normal. Judgment and thought content normal.    LABORATORY PANEL:   CBC  Recent Labs Lab 12/23/15 1939  WBC 13.0*  HGB 12.2  HCT 37.4  PLT 280   ------------------------------------------------------------------------------------------------------------------  Chemistries   Recent Labs Lab 12/23/15 1939  NA 140  K 3.4*  CL 101  CO2 30  GLUCOSE 136*  BUN 13  CREATININE 0.80  CALCIUM 9.6   ------------------------------------------------------------------------------------------------------------------  Cardiac Enzymes  Recent Labs Lab 12/23/15 1939  TROPONINI <0.03   ------------------------------------------------------------------------------------------------------------------  RADIOLOGY:  Dg Chest 2 View  12/23/2015  CLINICAL DATA:  she was walking from her car to the mall tonight; went in and sat down, not feeling well; c/o cp, nausea but no vomiting; no cardiac history; hx of asthma EXAM: CHEST  2 VIEW COMPARISON:  03/29/2014 FINDINGS: The heart size and mediastinal  contours are within normal limits. Both lungs are clear. The visualized skeletal structures are unremarkable. IMPRESSION: No active cardiopulmonary disease. Electronically Signed   By: Norva Pavlov M.D.   On: 12/23/2015 20:02    EKG:   Orders placed or performed during the hospital encounter of 12/23/15  . EKG 12-Lead  . EKG 12-Lead  . ED EKG within 10 minutes  . ED EKG within 10 minutes  . ED EKG  . ED EKG    IMPRESSION AND PLAN:  Principal Problem:   Chest pain - resolved now with nitro patch.  Patient had similar symptoms several years ago with reported stress test and subsequent catheterization.  She does not recall the result of cath, but states she did not have any stents placed.  We will admit with telemetry, trend CE, get echocardiogram and cardiology consult. Active Problems:   Hypertension - continue home meds   Diabetes type 2, controlled (HCC) - SSI with corresponding glucose checks q6 hours while NPO for possible cardiac procedure   GERD (gastroesophageal reflux disease) - home dose PPI  All the records are reviewed and case discussed with ED provider. Management plans discussed  with the patient and/or family.  DVT PROPHYLAXIS: SubQ lovenox  GI PROPHYLAXIS: PPI  ADMISSION STATUS: Observation  CODE STATUS: Full Code Status History    This patient does not have a recorded code status. Please follow your organizational policy for patients in this situation.      TOTAL TIME TAKING CARE OF THIS PATIENT: 40 minutes.    Yukari Flax FIELDING 12/23/2015, 10:30 PM  [facility]  Office  [phone]  CC: Primary care physician; Jaclyn Shaggy, MD

## 2015-12-23 NOTE — ED Notes (Signed)
Pt reports chest tightness, started at 1500, intermittent pain, pt with husband in room, denies N/V/sweats, c/o SOB with exertion

## 2015-12-23 NOTE — ED Provider Notes (Signed)
[facility] Emergency Department Provider Note  ____________________________________________  Time seen: Approximately 10:01 PM  I have reviewed the triage vital signs and the nursing notes.   HISTORY  Chief Complaint Chest Pain    HPI Destiny Savage is a 65 y.o. female presents for evaluation of left-sided chest pressure.  Started while walking. Left-sided pressure radiates to left arm. Presently improved. Improved some with rest. Do not take any aspirin previous. Does take Plavix. No known history of coronary disease. Denies recent stress testing or follow-up with cardiology.  Does have diabetes, hypertension, hyperlipidemia.   Past Medical History  Diagnosis Date  . Diabetes mellitus   . Hypertension   . Stroke (HCC)   . Arthritis   . GERD (gastroesophageal reflux disease)     Patient Active Problem List   Diagnosis Date Noted  . Vertigo 05/11/2012  . Hypertension 05/11/2012  . Diabetes type 2, controlled (HCC) 05/11/2012  . Neck pain on right side 05/11/2012    Past Surgical History  Procedure Laterality Date  . Abdominal hysterectomy    . Cholecystectomy      Current Outpatient Rx  Name  Route  Sig  Dispense  Refill  . sitaGLIPtin (JANUVIA) 100 MG tablet   Oral   Take 100 mg by mouth daily.         . carvedilol (COREG) 6.25 MG tablet   Oral   Take 6.25 mg by mouth 2 (two) times daily with a meal.         . clopidogrel (PLAVIX) 75 MG tablet   Oral   Take 75 mg by mouth daily.         . metFORMIN (GLUCOPHAGE-XR) 500 MG 24 hr tablet   Oral   Take 1 tablet (500 mg total) by mouth daily with breakfast. Do not start taking again until 05/14/12         . omeprazole (PRILOSEC) 20 MG capsule   Oral   Take 20 mg by mouth daily.         . simvastatin (ZOCOR) 40 MG tablet   Oral   Take 40 mg by mouth every evening.         . sulfacetamide (BLEPH-10) 10 % ophthalmic solution   Both Eyes   Place 2 drops into both  eyes 4 (four) times daily.   5 mL   0     Allergies Latex and Lidocaine  History reviewed. No pertinent family history.  Social History Social History  Substance Use Topics  . Smoking status: Never Smoker   . Smokeless tobacco: None  . Alcohol Use: No    Review of Systems Constitutional: No fever/chills Eyes: No visual changes. ENT: No sore throat. Cardiovascular: See history of present illness Respiratory: Denies shortness of breath. Gastrointestinal: No abdominal pain.  No nausea, no vomiting.  No diarrhea.  No constipation. Genitourinary: Negative for dysuria. Musculoskeletal: Negative for back pain. Skin: Negative for rash. Neurological: Negative for headaches, focal weakness or numbness.  10-point ROS otherwise negative.  ____________________________________________   PHYSICAL EXAM:  VITAL SIGNS: ED Triage Vitals  Enc Vitals Group     BP 12/23/15 1927 152/85 mmHg     Pulse Rate 12/23/15 1927 75     Resp 12/23/15 1927 18     Temp 12/23/15 1927 98.3 F (36.8 C)     Temp Source 12/23/15 1927 Oral     SpO2 12/23/15 1927 100 %     Weight 12/23/15 1927 221 lb (100.245  kg)     Height 12/23/15 1927  (1.702 m)     Head Cir --      Peak Flow --      Pain Score 12/23/15 1931 10     Pain Loc --      Pain Edu? --      Excl. in GC? --    Constitutional: Alert and oriented. Well appearing and in no acute distress. Eyes: Conjunctivae are normal. PERRL. EOMI. Head: Atraumatic. Nose: No congestion/rhinnorhea. Mouth/Throat: Mucous membranes are moist.  Oropharynx non-erythematous. Neck: No stridor.   Cardiovascular: Normal rate, regular rhythm. Grossly normal heart sounds.  Good peripheral circulation. Respiratory: Normal respiratory effort.  No retractions. Lungs CTAB. Gastrointestinal: Soft and nontender. No distention.  Musculoskeletal: No lower extremity tenderness nor edema.  No joint effusions. Neurologic:  Normal speech and language. No gross focal  neurologic deficits are appreciated. Skin:  Skin is warm, dry and intact. No rash noted. Psychiatric: Mood and affect are normal. Speech and behavior are normal.  ____________________________________________   LABS (all labs ordered are listed, but only abnormal results are displayed)  Labs Reviewed  BASIC METABOLIC PANEL - Abnormal; Notable for the following:    Potassium 3.4 (*)    Glucose, Bld 136 (*)    All other components within normal limits  CBC - Abnormal; Notable for the following:    WBC 13.0 (*)    All other components within normal limits  TROPONIN I   ____________________________________________  EKG  Reviewed and interpreted me at 1930 Normal sinus rhythm No acute ischemic abnormality noted PR 170 QTc 450 Reviewed and interpreted as normal sinus rhythm without acute ischemic abnormality noted.  ED ECG REPORT I, Tanya Marvin, the attending physician, personally viewed and interpreted this ECG.  Date: 12/23/2015 EKG Time: 2200 Rate: 70 Rhythm: normal sinus rhythm QRS Axis: normal Intervals: normal ST/T Wave abnormalities: normal Conduction Disturbances: none Narrative Interpretation: unremarkable  ____________________________________________  RADIOLOGY  DG Chest 2 View (Final result) Result time: 12/23/15 20:02:17   Final result by Rad Results In Interface (12/23/15 20:02:17)   Narrative:   CLINICAL DATA: she was walking from her car to the mall tonight; went in and sat down, not feeling well; c/o cp, nausea but no vomiting; no cardiac history; hx of asthma  EXAM: CHEST 2 VIEW  COMPARISON: 03/29/2014  FINDINGS: The heart size and mediastinal contours are within normal limits. Both lungs are clear. The visualized skeletal structures are unremarkable.  IMPRESSION: No active cardiopulmonary disease.   Electronically Signed By: Norva Pavlov M.D. On: 12/23/2015 20:02     ____________________________________________   PROCEDURES  Procedure(s) performed: None  Critical Care performed: No  ____________________________________________   INITIAL IMPRESSION / ASSESSMENT AND PLAN / ED COURSE  Pertinent labs & imaging results that were available during my care of the patient were reviewed by me and considered in my medical decision making (see chart for details).  Patient presents for evaluation of intermittent upper left chest pressure. Started while walking, relieved with rest. Presently improved, but patient reports intermittently having return of discomfort lasting a couple minutes and go away. Patient does have significant coronary risk factors for heart score indicates moderate risk.  No pleuritic pain, hypoxia, or pulmonary symptoms. No ripping or tearing pain. Based on the patient's previous history including cerebrovascular disease, hypertension, hyperlipidemia and diabetes patient is at risk for coronary disease. Thus far testing including first troponin and EKG did not demonstrate acute ischemic abnormality.  Based on the patient's  risk factors and intermittent symptomatology, we will admit her to the hospital for further workup for chest pain evaluation. Patient and her husband agreeable. ____________________________________________   FINAL CLINICAL IMPRESSION(S) / ED DIAGNOSES  Final diagnoses:  Chest pain, moderate coronary artery risk      Sharyn Creamer, MD 12/23/15 2204

## 2015-12-23 NOTE — ED Notes (Signed)
Pt c/o a heaviness in the center of her chest that radiates across the top left side of her chest; started about an hour ago while she was walking from her car to the mall; went in and sat down, not feeling well; c/o nausea but no vomiting; no cardiac history; talking softly in complete coherent sentences

## 2015-12-23 NOTE — ED Notes (Signed)
2x Cola given to family (husband) upon request

## 2015-12-24 ENCOUNTER — Observation Stay (HOSPITAL_BASED_OUTPATIENT_CLINIC_OR_DEPARTMENT_OTHER)
Admit: 2015-12-24 | Discharge: 2015-12-24 | Disposition: A | Discharge status: Home / Self Care | Payer: Medicare Other | Attending: Internal Medicine | Admitting: Internal Medicine

## 2015-12-24 DIAGNOSIS — R079 Chest pain, unspecified: Secondary | ICD-10-CM

## 2015-12-24 LAB — BASIC METABOLIC PANEL
Anion gap: 9 (ref 5–15)
BUN: 12 mg/dL (ref 6–20)
CHLORIDE: 103 mmol/L (ref 101–111)
CO2: 28 mmol/L (ref 22–32)
Calcium: 9.2 mg/dL (ref 8.9–10.3)
Creatinine, Ser: 0.68 mg/dL (ref 0.44–1.00)
GFR calc non Af Amer: >60 mL/min (ref >60)
Glucose, Bld: 109 mg/dL — ABNORMAL HIGH (ref 65–99)
POTASSIUM: 3.8 mmol/L (ref 3.5–5.1)
SODIUM: 140 mmol/L (ref 135–145)

## 2015-12-24 LAB — CBC
HEMATOCRIT: 36.3 % (ref 35.0–47.0)
Hemoglobin: 12.4 g/dL (ref 12.0–16.0)
MCH: 31.8 pg (ref 26.0–34.0)
MCHC: 34.2 g/dL (ref 32.0–36.0)
MCV: 92.9 fL (ref 80.0–100.0)
Platelets: 252 10*3/uL (ref 150–440)
RBC: 3.9 MIL/uL (ref 3.80–5.20)
RDW: 14.3 % (ref 11.5–14.5)
WBC: 13.7 10*3/uL — ABNORMAL HIGH (ref 3.6–11.0)

## 2015-12-24 LAB — GLUCOSE, CAPILLARY
GLUCOSE-CAPILLARY: 154 mg/dL — AB (ref 65–99)
Glucose-Capillary: 88 mg/dL (ref 65–99)
Glucose-Capillary: 97 mg/dL (ref 65–99)

## 2015-12-24 LAB — HEMOGLOBIN A1C: Hgb A1c MFr Bld: 6.3 % — ABNORMAL HIGH (ref 4.0–6.0)

## 2015-12-24 LAB — TROPONIN I
Troponin I: <0.03 ng/mL (ref <0.031)
Troponin I: <0.03 ng/mL (ref <0.031)

## 2015-12-24 MED ORDER — CLOPIDOGREL BISULFATE 75 MG PO TABS: 525.0000 mg | ORAL_TABLET | Freq: Once | ORAL | Status: DC

## 2015-12-24 MED ORDER — ASPIRIN 81 MG PO CHEW: 324.0000 mg | CHEWABLE_TABLET | Freq: Every day | ORAL | Status: DC

## 2015-12-24 MED ORDER — INSULIN ASPART 100 UNIT/ML ~~LOC~~ SOLN: 0.0000 [IU] | Freq: Every day | SUBCUTANEOUS | Status: DC

## 2015-12-24 MED ORDER — ONDANSETRON HCL 4 MG PO TABS: 4.0000 mg | ORAL_TABLET | Freq: Four times a day (QID) | ORAL | Status: DC | PRN

## 2015-12-24 MED ORDER — CLOPIDOGREL BISULFATE 75 MG PO TABS
75.0000 mg | ORAL_TABLET | Freq: Every day | ORAL | Status: DC
  Administered 2015-12-24: 75 mg via ORAL
  Filled 2015-12-24: qty 1

## 2015-12-24 MED ORDER — SODIUM CHLORIDE 0.9% FLUSH
3.0000 mL | Freq: Two times a day (BID) | INTRAVENOUS | Status: DC
  Administered 2015-12-24 (×2): 3 mL via INTRAVENOUS

## 2015-12-24 MED ORDER — MORPHINE SULFATE (PF) 2 MG/ML IV SOLN
2.0000 mg | INTRAVENOUS | Status: DC | PRN
  Administered 2015-12-24: 2 mg via INTRAVENOUS
  Filled 2015-12-24: qty 1

## 2015-12-24 MED ORDER — PANTOPRAZOLE SODIUM 40 MG PO TBEC
40.0000 mg | DELAYED_RELEASE_TABLET | Freq: Every day | ORAL | Status: DC
  Administered 2015-12-24: 40 mg via ORAL
  Filled 2015-12-24: qty 1

## 2015-12-24 MED ORDER — ENOXAPARIN SODIUM 40 MG/0.4ML ~~LOC~~ SOLN
40.0000 mg | Freq: Every day | SUBCUTANEOUS | Status: DC
  Administered 2015-12-24: 40 mg via SUBCUTANEOUS
  Filled 2015-12-24: qty 0.4

## 2015-12-24 MED ORDER — SIMVASTATIN 40 MG PO TABS: 40.0000 mg | ORAL_TABLET | Freq: Every evening | ORAL | Status: DC

## 2015-12-24 MED ORDER — CLOPIDOGREL BISULFATE 75 MG PO TABS: 75.0000 mg | ORAL_TABLET | Freq: Every day | ORAL | Status: DC

## 2015-12-24 MED ORDER — ACETAMINOPHEN 325 MG PO TABS
650.0000 mg | ORAL_TABLET | Freq: Four times a day (QID) | ORAL | Status: DC | PRN
  Administered 2015-12-24: 650 mg via ORAL
  Filled 2015-12-24: qty 2

## 2015-12-24 MED ORDER — ACETAMINOPHEN 650 MG RE SUPP: 650.0000 mg | Freq: Four times a day (QID) | RECTAL | Status: DC | PRN

## 2015-12-24 MED ORDER — CARVEDILOL 6.25 MG PO TABS
6.2500 mg | ORAL_TABLET | Freq: Two times a day (BID) | ORAL | Status: DC
  Administered 2015-12-24: 6.25 mg via ORAL
  Filled 2015-12-24: qty 1

## 2015-12-24 MED ORDER — ONDANSETRON HCL 4 MG/2ML IJ SOLN: 4.0000 mg | Freq: Four times a day (QID) | INTRAMUSCULAR | Status: DC | PRN

## 2015-12-24 MED ORDER — INSULIN ASPART 100 UNIT/ML ~~LOC~~ SOLN
0.0000 [IU] | Freq: Three times a day (TID) | SUBCUTANEOUS | Status: DC
  Administered 2015-12-24: 2 [IU] via SUBCUTANEOUS
  Filled 2015-12-24: qty 2

## 2015-12-24 NOTE — Progress Notes (Signed)
Discharge: Pt d/c from room via wheelchair, Family member with the pt. Discharge instructions given to the patient and family members.  No questions from pt, reintegrated to the pt to call or go to the ED for chest discomfort. Pt dressed in street clothes and left with discharge papers  in hand. IV d/ced, tele removed and no complaints of pain or discomfort. 

## 2015-12-24 NOTE — Progress Notes (Signed)
Pt alert and oriented x4, no complaints of pain or discomfort.  Bed in low position, call bell within reach.  Bed alarms on and functioning.  Assessment done and charted.  Will continue to monitor and do hourly rounding throughout the shift.  Pain noted    1/10.

## 2015-12-24 NOTE — ED Notes (Signed)
Floor called for pt arrival; bagged lunch given to pt's husband

## 2015-12-24 NOTE — Consult Note (Signed)
Cardiology Consultation Note  Patient ID: Destiny Savage, MRN: [id number], DOB/AGE: 1951/07/03 65 y.o. Admit date: 12/23/2015   Date of Consult: 12/24/2015 Primary Physician: Jaclyn Shaggy, MD Primary Cardiologist: New to [facility]  Chief Complaint: Chest pain Reason for Consult: Chest pain  HPI: 65 y.o. female with h/o TIA, DM2, HTN, HLD, and vertigo who presented to [facility] with chest pain while at rest.   She was previously followed by Dr. Park Breed while inpatient but has not followed up with him as an outpatient. She was admitted to [facility] in 11/2001 for chest pain Dr. Juliann Pares performed a cardiac cath that did not show any significant coroanry disease. She was again admitted to [facility] in 2004 for chest pain and had a nuclear stress test that did not show any ischemia. She also had a normal echo. She has had off-and-on admissions over the years for abdominal pain and C diff infections. No further ischemic evaluations since 2004. She has not followed up with a cardiologist since her admissions above.   She has been feeling "sluggish" at home as of late. While at home on 2/4 she had two episodes of chest pain. She cannot tell me how long these episodes lasted but they lasted long enough for her to come to the hospital. There were no associated symptoms. She has never been a smoker and denies any illegal drug abuse. Upon the patient's arrival to [facility] they were found to have a negative troponin x 3, ECG non-acute, CXR showed with no active cardiopulmonary disease. She has remained chest pain free throughout her admission.      Past Medical History  Diagnosis Date  . Diabetes mellitus   . Hypertension   . Stroke (HCC)   . Arthritis   . GERD (gastroesophageal reflux disease)       Most Recent Cardiac Studies: As above.    Surgical History:  Past Surgical History  Procedure Laterality Date  . Abdominal hysterectomy    . Cholecystectomy       Home Meds: Prior to Admission medications   Medication Sig  Start Date End Date Taking? Authorizing Provider  carvedilol (COREG) 6.25 MG tablet Take 6.25 mg by mouth 2 (two) times daily with a meal.   Yes Historical Provider, MD  clopidogrel (PLAVIX) 75 MG tablet Take 75 mg by mouth daily.   Yes Historical Provider, MD  omeprazole (PRILOSEC) 20 MG capsule Take 20 mg by mouth daily.   Yes Historical Provider, MD  simvastatin (ZOCOR) 40 MG tablet Take 40 mg by mouth every evening.   Yes Historical Provider, MD  sitaGLIPtin (JANUVIA) 100 MG tablet Take 100 mg by mouth daily.   Yes Historical Provider, MD  sulfacetamide (BLEPH-10) 10 % ophthalmic solution Place 2 drops into both eyes 4 (four) times daily.   Yes Historical Provider, MD    Inpatient Medications:  . carvedilol  6.25 mg Oral BID WC  . clopidogrel  75 mg Oral Daily  . enoxaparin (LOVENOX) injection  40 mg Subcutaneous QHS  . insulin aspart  0-5 Units Subcutaneous QHS  . insulin aspart  0-9 Units Subcutaneous TID WC  . pantoprazole  40 mg Oral QAC breakfast  . simvastatin  40 mg Oral QPM  . sodium chloride flush  3 mL Intravenous Q12H      Allergies:  Allergies  Allergen Reactions  . Latex Hives  . Lidocaine Hives    Social History   Social History  . Marital Status: Married    Spouse Name:  N/A  . Number of Children: N/A  . Years of Education: N/A   Occupational History  . Not on file.   Social History Main Topics  . Smoking status: Never Smoker   . Smokeless tobacco: Not on file  . Alcohol Use: No  . Drug Use: No  . Sexual Activity: Yes    Birth Control/ Protection: Surgical   Other Topics Concern  . Not on file   Social History Narrative     Family History  Problem Relation Age of Onset  . Hypertension    . CAD       Review of Systems: Review of Systems  Constitutional: Positive for malaise/fatigue. Negative for fever, chills, weight loss and diaphoresis.  HENT: Negative for congestion.   Eyes: Negative for discharge and redness.  Respiratory: Negative  for cough, hemoptysis, sputum production, shortness of breath and wheezing.   Cardiovascular: Positive for chest pain. Negative for palpitations, orthopnea, claudication, leg swelling and PND.  Gastrointestinal: Negative for heartburn, nausea, vomiting and abdominal pain.  Musculoskeletal: Negative for myalgias and falls.  Skin: Negative for rash.  Neurological: Positive for weakness. Negative for dizziness, sensory change, speech change and focal weakness.  Endo/Heme/Allergies: Does not bruise/bleed easily.  Psychiatric/Behavioral: The patient is not nervous/anxious.   All other systems reviewed and are negative.   Labs:  Recent Labs  12/23/15 1939 12/24/15 0113 12/24/15 0709  TROPONINI <0.03 <0.03 <0.03   Lab Results  Component Value Date   WBC 13.7* 12/24/2015   HGB 12.4 12/24/2015   HCT 36.3 12/24/2015   MCV 92.9 12/24/2015   PLT 252 12/24/2015    Recent Labs Lab 12/24/15 0709  NA 140  K 3.8  CL 103  CO2 28  BUN 12  CREATININE 0.68  CALCIUM 9.2  GLUCOSE 109*   Lab Results  Component Value Date   CHOL 191 07/10/2014   HDL 88* 07/10/2014   LDLCALC 82 07/10/2014   TRIG 105 07/10/2014   No results found for: DDIMER  Radiology/Studies:  Dg Chest 2 View  12/23/2015  CLINICAL DATA:  she was walking from her car to the mall tonight; went in and sat down, not feeling well; c/o cp, nausea but no vomiting; no cardiac history; hx of asthma EXAM: CHEST  2 VIEW COMPARISON:  03/29/2014 FINDINGS: The heart size and mediastinal contours are within normal limits. Both lungs are clear. The visualized skeletal structures are unremarkable. IMPRESSION: No active cardiopulmonary disease. Electronically Signed   By: Norva Pavlov M.D.   On: 12/23/2015 20:02    EKG: NSR, 68 bpm, low voltage  Weights: Filed Weights   12/23/15 1927 12/24/15 0104  Weight: 221 lb (100.245 kg) 220 lb 8 oz (100.018 kg)     Physical Exam: Blood pressure 134/71, pulse 75, temperature 98 F (36.7  C), temperature source Oral, resp. rate 18, height  (1.702 m), weight 220 lb 8 oz (100.018 kg), SpO2 98 %. Body mass index is 34.53 kg/(m^2). General: Well developed, well nourished, in no acute distress. Head: Normocephalic, atraumatic, sclera non-icteric, no xanthomas, nares are without discharge.  Neck: Negative for carotid bruits. JVD not elevated. Lungs: Clear bilaterally to auscultation without wheezes, rales, or rhonchi. Breathing is unlabored. Heart: RRR with S1 S2. No murmurs, rubs, or gallops appreciated. Abdomen: Obese, soft, non-tender, non-distended with normoactive bowel sounds. No hepatomegaly. No rebound/guarding. No obvious abdominal masses. Msk:  Strength and tone appear normal for age. Extremities: No clubbing or cyanosis. No edema.  Distal pedal pulses  are 2+ and equal bilaterally. Neuro: Alert and oriented X 3. No facial asymmetry. No focal deficit. Moves all extremities spontaneously. Psych:  Responds to questions appropriately with a normal affect.    Assessment and Plan:   1. Atypical chest pain: -Currently chest pain free -She has ruled out -Echo is pending -Schedule outpatient nuclear stress test -Consider adding Imdur 30 mg -Continue Plavix 2/2 history of TIA -On Coreg and simvastatin   2. HTN: -Improved -Continue current medications  3. DM2: -Per IM  4. Leukocytosis: -No sings of active infection   5. History of TIA: -Previously on Plavix per primary care   Signed, Eula Listen, PA-C Pager: [phone] 12/24/2015, 10:06 AM  I have seen and examined this patient with Eula Listen.  Agree with above, note added to reflect my findings.  On exam, regular rhythm, no murmurs, lungs clear.    She is doing well today with no further chest pain. Her EKG shows no acute ST or T wave changes and her troponins have been negative. She also has an echo pending. Due to the fact that she is chest pain-free, she Roselin Wiemann be arranged for an outpatient nuclear stress  test. Would continue her Coreg and simvastatin. Would also continue her aspirin and Plavix.  Adolph Clutter M. Jailin Moomaw MD 12/24/2015 10:59 AM

## 2015-12-24 NOTE — Progress Notes (Signed)
*  PRELIMINARY RESULTS* Echocardiogram 2D Echocardiogram has been performed.  Destiny Savage Stills 12/24/2015, 9:58 AM

## 2015-12-24 NOTE — Discharge Summary (Signed)
[facility] -  at [facility]   PATIENT NAME: Destiny Savage    MR#:  [id number]  DATE OF BIRTH:  02-03-1951  DATE OF ADMISSION:  12/23/2015 ADMITTING PHYSICIAN: Oralia Manis, MD  DATE OF DISCHARGE: 12/24/2015  PRIMARY CARE PHYSICIAN: Jaclyn Shaggy, MD    ADMISSION DIAGNOSIS:  Chest pain, moderate coronary artery risk [R07.9]  DISCHARGE DIAGNOSIS:  Principal Problem:   Chest pain Active Problems:   Hypertension   Diabetes type 2, controlled (HCC)   GERD (gastroesophageal reflux disease)   SECONDARY DIAGNOSIS:   Past Medical History  Diagnosis Date  . Diabetes mellitus   . Hypertension   . Stroke (HCC)   . Arthritis   . GERD (gastroesophageal reflux disease)     HOSPITAL COURSE:    65 year old very pleasant female with history of diabetes and hypertension who presents with chest pain. For further details please further H&P.  1. Atypical chest pain: Patient was admitted to hospital service. Troponins sensory negative. 2-D echocardiogram was ordered and will be followed up as an outpatient. Cardiology was consulted. They're recommending outpatient stress test which we will rearrange for early this week. Patient is chest pain-free throughout her hospitalization.  2. Essential hypertension: Patient will continue Coreg at discharge.  3. Hyperlipidemia: Continue simvastatin at discharge.  4. Type 2 diabetes without complication: Patient will continue ADA diet and Januvia. DISCHARGE CONDITIONS AND DIET:   Patient is stable for discharge on a heart healthy diabetic diet  CONSULTS OBTAINED:  Treatment Team:  Will Jorja Loa, MD  DRUG ALLERGIES:   Allergies  Allergen Reactions  . Latex Hives  . Lidocaine Hives    DISCHARGE MEDICATIONS:   Current Discharge Medication List    CONTINUE these medications which have NOT CHANGED   Details  carvedilol (COREG) 6.25 MG tablet Take 6.25 mg by mouth 2 (two) times daily with a meal.     clopidogrel (PLAVIX) 75 MG tablet Take 75 mg by mouth daily.    omeprazole (PRILOSEC) 20 MG capsule Take 20 mg by mouth daily.    simvastatin (ZOCOR) 40 MG tablet Take 40 mg by mouth every evening.    sitaGLIPtin (JANUVIA) 100 MG tablet Take 100 mg by mouth daily.    sulfacetamide (BLEPH-10) 10 % ophthalmic solution Place 2 drops into both eyes 4 (four) times daily.              Today   CHIEF COMPLAINT:  Patient is doing well this point. She reports no chest pain.   VITAL SIGNS:  Blood pressure 134/71, pulse 75, temperature 98 F (36.7 C), temperature source Oral, resp. rate 18, height  (1.702 m), weight 100.018 kg (220 lb 8 oz), SpO2 98 %.   REVIEW OF SYSTEMS:  Review of Systems  Constitutional: Negative for fever, chills and malaise/fatigue.  HENT: Negative for sore throat.   Eyes: Negative for blurred vision.  Respiratory: Negative for cough, hemoptysis, shortness of breath and wheezing.   Cardiovascular: Negative for chest pain, palpitations and leg swelling.  Gastrointestinal: Negative for nausea, vomiting, abdominal pain, diarrhea and blood in stool.  Genitourinary: Negative for dysuria.  Musculoskeletal: Negative for back pain.  Neurological: Negative for dizziness, tremors and headaches.  Endo/Heme/Allergies: Does not bruise/bleed easily.     PHYSICAL EXAMINATION:  GENERAL:  65 y.o.-year-old patient lying in the bed with no acute distress.  NECK:  Supple, no jugular venous distention. No thyroid enlargement, no tenderness.  LUNGS: Normal breath sounds bilaterally, no wheezing, rales,rhonchi  No use of accessory muscles of respiration.  CARDIOVASCULAR: S1, S2 normal. No murmurs, rubs, or gallops.  ABDOMEN: Soft, non-tender, non-distended. Bowel sounds present. No organomegaly or mass.  EXTREMITIES: No pedal edema, cyanosis, or clubbing.  PSYCHIATRIC: The patient is alert and oriented x 3.  SKIN: No obvious rash, lesion, or ulcer.   DATA REVIEW:    CBC  Recent Labs Lab 12/24/15 0709  WBC 13.7*  HGB 12.4  HCT 36.3  PLT 252    Chemistries   Recent Labs Lab 12/24/15 0709  NA 140  K 3.8  CL 103  CO2 28  GLUCOSE 109*  BUN 12  CREATININE 0.68  CALCIUM 9.2    Cardiac Enzymes  Recent Labs Lab 12/23/15 1939 12/24/15 0113 12/24/15 0709  TROPONINI <0.03 <0.03 <0.03    Microbiology Results  @  RADIOLOGY:  Dg Chest 2 View  12/23/2015  CLINICAL DATA:  she was walking from her car to the mall tonight; went in and sat down, not feeling well; c/o cp, nausea but no vomiting; no cardiac history; hx of asthma EXAM: CHEST  2 VIEW COMPARISON:  03/29/2014 FINDINGS: The heart size and mediastinal contours are within normal limits. Both lungs are clear. The visualized skeletal structures are unremarkable. IMPRESSION: No active cardiopulmonary disease. Electronically Signed   By: Norva Pavlov M.D.   On: 12/23/2015 20:02      Management plans discussed with the patient and she is in agreement. Stable for discharge home  Patient should follow up with [facility] this week  CODE STATUS:     Code Status Orders        Start     Ordered   12/24/15 0056  Full code   Continuous     12/24/15 0055    Code Status History    Date Active Date Inactive Code Status Order ID Comments User Context   This patient has a current code status but no historical code status.      TOTAL TIME TAKING CARE OF THIS PATIENT: 35 minutes.    Note: This dictation was prepared with Dragon dictation along with smaller phrase technology. Any transcriptional errors that result from this process are unintentional.  Jhonnie Aliano M.D on 12/24/2015 at 10:47 AM  Between 7am to 6pm - Pager - [phone] After 6pm go to [email or web address] - password [email or web address] [facility]  [place] [facility]  Office  [phone]  CC: Primary care physician; Jaclyn Shaggy, MD

## 2015-12-26 ENCOUNTER — Emergency Department
Admission: EM | Admit: 2015-12-26 | Discharge: 2015-12-26 | Disposition: A | Discharge status: Home / Self Care | Payer: Medicare Other | Attending: Emergency Medicine | Admitting: Emergency Medicine

## 2015-12-26 DIAGNOSIS — R112 Nausea with vomiting, unspecified: Secondary | ICD-10-CM | POA: Diagnosis not present

## 2015-12-26 DIAGNOSIS — R531 Weakness: Secondary | ICD-10-CM | POA: Diagnosis not present

## 2015-12-26 DIAGNOSIS — R110 Nausea: Secondary | ICD-10-CM

## 2015-12-26 DIAGNOSIS — E119 Type 2 diabetes mellitus without complications: Secondary | ICD-10-CM | POA: Diagnosis not present

## 2015-12-26 DIAGNOSIS — Z91040 Latex allergy status: Secondary | ICD-10-CM | POA: Diagnosis not present

## 2015-12-26 DIAGNOSIS — R197 Diarrhea, unspecified: Secondary | ICD-10-CM | POA: Insufficient documentation

## 2015-12-26 DIAGNOSIS — I10 Essential (primary) hypertension: Secondary | ICD-10-CM | POA: Insufficient documentation

## 2015-12-26 DIAGNOSIS — R1013 Epigastric pain: Secondary | ICD-10-CM | POA: Diagnosis not present

## 2015-12-26 DIAGNOSIS — Z79899 Other long term (current) drug therapy: Secondary | ICD-10-CM | POA: Diagnosis not present

## 2015-12-26 DIAGNOSIS — G8929 Other chronic pain: Secondary | ICD-10-CM | POA: Insufficient documentation

## 2015-12-26 LAB — CBC WITH DIFFERENTIAL/PLATELET
BASOS PCT: 0 %
Basophils Absolute: 0 10*3/uL (ref 0–0.1)
EOS ABS: 0.2 10*3/uL (ref 0–0.7)
Eosinophils Relative: 1 %
HEMATOCRIT: 39.7 % (ref 35.0–47.0)
Hemoglobin: 13.7 g/dL (ref 12.0–16.0)
Lymphocytes Relative: 6 %
Lymphs Abs: 0.9 10*3/uL — ABNORMAL LOW (ref 1.0–3.6)
MCH: 34 pg (ref 26.0–34.0)
MCHC: 34.5 g/dL (ref 32.0–36.0)
MCV: 98.6 fL (ref 80.0–100.0)
MONO ABS: 1 10*3/uL — AB (ref 0.2–0.9)
MONOS PCT: 7 %
Neutro Abs: 12.4 10*3/uL — ABNORMAL HIGH (ref 1.4–6.5)
Neutrophils Relative %: 86 %
Platelets: 272 10*3/uL (ref 150–440)
RBC: 4.02 MIL/uL (ref 3.80–5.20)
RDW: 14.8 % — AB (ref 11.5–14.5)
WBC: 14.5 10*3/uL — ABNORMAL HIGH (ref 3.6–11.0)

## 2015-12-26 LAB — COMPREHENSIVE METABOLIC PANEL
ALBUMIN: 4.2 g/dL (ref 3.5–5.0)
ALT: 16 U/L (ref 14–54)
ANION GAP: 12 (ref 5–15)
AST: 22 U/L (ref 15–41)
Alkaline Phosphatase: 81 U/L (ref 38–126)
BILIRUBIN TOTAL: 0.6 mg/dL (ref 0.3–1.2)
BUN: 19 mg/dL (ref 6–20)
CO2: 24 mmol/L (ref 22–32)
Calcium: 9.9 mg/dL (ref 8.9–10.3)
Chloride: 103 mmol/L (ref 101–111)
Creatinine, Ser: 0.94 mg/dL (ref 0.44–1.00)
GFR calc Af Amer: >60 mL/min (ref >60)
GFR calc non Af Amer: >60 mL/min (ref >60)
GLUCOSE: 177 mg/dL — AB (ref 65–99)
POTASSIUM: 4.4 mmol/L (ref 3.5–5.1)
SODIUM: 139 mmol/L (ref 135–145)
TOTAL PROTEIN: 7.8 g/dL (ref 6.5–8.1)

## 2015-12-26 LAB — LIPASE, BLOOD: LIPASE: 28 U/L (ref 11–51)

## 2015-12-26 LAB — TROPONIN I: Troponin I: <0.03 ng/mL (ref <0.031)

## 2015-12-26 MED ORDER — MORPHINE SULFATE (PF) 4 MG/ML IV SOLN
INTRAVENOUS | Status: AC
  Administered 2015-12-26: 4 mg via INTRAVENOUS
  Filled 2015-12-26: qty 1

## 2015-12-26 MED ORDER — MORPHINE SULFATE (PF) 4 MG/ML IV SOLN
4.0000 mg | Freq: Once | INTRAVENOUS | Status: AC
  Administered 2015-12-26: 4 mg via INTRAVENOUS

## 2015-12-26 MED ORDER — SODIUM CHLORIDE 0.9 % IV BOLUS (SEPSIS)
1000.0000 mL | Freq: Once | INTRAVENOUS | Status: AC
  Administered 2015-12-26: 1000 mL via INTRAVENOUS

## 2015-12-26 MED ORDER — ONDANSETRON HCL 4 MG/2ML IJ SOLN
4.0000 mg | Freq: Once | INTRAMUSCULAR | Status: AC
  Administered 2015-12-26: 4 mg via INTRAVENOUS
  Filled 2015-12-26: qty 2

## 2015-12-26 NOTE — ED Provider Notes (Signed)
[facility] Emergency Department Provider Note   ____________________________________________  Time seen: Approximately 6:25 PM I have reviewed the triage vital signs and the triage nursing note.  HISTORY  Chief Complaint Emesis   Historian Patient and spouse  HPI Destiny Savage is a 65 y.o. female with a history of chronic GI upset including frequent episodes of chronic abdominal pain associated with nausea and vomiting. She has followed with gastroenterology in the past and no certain diagnosis has been made per the family. Patient is actively having pain and nausea and husband provides most history that 2 hours ago she started having epigastric cramping with nausea and some emesis on the nonbloody and nonbilious. She's also had some diarrhea, watery and non-bloody. The abdominal discomfort is similar to prior episodes. EMS reported that the patient had been disoriented, but here she is reporting generalized weakness that is oriented and able to give history. Symptoms are moderate. Husband states the patient normally receives pain and nausea medicine with fluids and feels better and is able to go home.    Past Medical History  Diagnosis Date  . Diabetes mellitus   . Hypertension   . Stroke (HCC)   . Arthritis   . GERD (gastroesophageal reflux disease)     Patient Active Problem List   Diagnosis Date Noted  . Chest pain 12/23/2015  . GERD (gastroesophageal reflux disease) 12/23/2015  . Vertigo 05/11/2012  . Hypertension 05/11/2012  . Diabetes type 2, controlled (HCC) 05/11/2012  . Neck pain on right side 05/11/2012    Past Surgical History  Procedure Laterality Date  . Abdominal hysterectomy    . Cholecystectomy      Current Outpatient Rx  Name  Route  Sig  Dispense  Refill  . carvedilol (COREG) 6.25 MG tablet   Oral   Take 6.25 mg by mouth 2 (two) times daily with a meal.         . clopidogrel (PLAVIX) 75 MG tablet   Oral   Take 75 mg  by mouth daily.         Marland Kitchen omeprazole (PRILOSEC) 20 MG capsule   Oral   Take 20 mg by mouth daily.         . simvastatin (ZOCOR) 40 MG tablet   Oral   Take 40 mg by mouth every evening.         . sitaGLIPtin (JANUVIA) 100 MG tablet   Oral   Take 100 mg by mouth daily.         Marland Kitchen sulfacetamide (BLEPH-10) 10 % ophthalmic solution   Both Eyes   Place 2 drops into both eyes 4 (four) times daily.           Allergies Latex and Lidocaine  Family History  Problem Relation Age of Onset  . Hypertension    . CAD      Social History Social History  Substance Use Topics  . Smoking status: Never Smoker   . Smokeless tobacco: Not on file  . Alcohol Use: No    Review of Systems  Constitutional: Negative for fever. Eyes: Negative for visual changes. ENT: Negative for sore throat. Cardiovascular: Negative for chest pain. Respiratory: Negative for shortness of breath. Gastrointestinal: Positive per history of present illness. Genitourinary: Negative for dysuria. Musculoskeletal: Negative for back pain. Skin: Negative for rash. Neurological: Negative for headache. 10 point Review of Systems otherwise negative ____________________________________________   PHYSICAL EXAM:  VITAL SIGNS: ED Triage Vitals  Enc Vitals Group  BP 12/26/15 1728 140/74 mmHg     Pulse Rate 12/26/15 1728 96     Resp 12/26/15 1728 16     Temp 12/26/15 1728 98.2 F (36.8 C)     Temp Source 12/26/15 1728 Oral     SpO2 12/26/15 1728 97 %     Weight 12/26/15 1728 222 lb (100.699 kg)     Height 12/26/15 1728  (1.676 m)     Head Cir --      Peak Flow --      Pain Score --      Pain Loc --      Pain Edu? --      Excl. in GC? --      Constitutional: Alert and oriented. Active nausea and emesis. HEENT   Head: Normocephalic and atraumatic.      Eyes: Conjunctivae are normal. PERRL. Normal extraocular movements.      Ears:         Nose: No congestion/rhinnorhea.    Mouth/Throat: Mucous membranes are moist.   Neck: No stridor. Cardiovascular/Chest: Normal rate, regular rhythm.  No murmurs, rubs, or gallops. Respiratory: Normal respiratory effort without tachypnea nor retractions. Breath sounds are clear and equal bilaterally. No wheezes/rales/rhonchi. Gastrointestinal: Soft. No distention, no guarding, no rebound. Moderate epigastric tenderness to palpation  Genitourinary/rectal:Deferred Musculoskeletal: Nontender with normal range of motion in all extremities. No joint effusions.  No lower extremity tenderness.  No edema. Neurologic:  Normal speech and language. No gross or focal neurologic deficits are appreciated. Skin:  Skin is warm, dry and intact. No rash noted. Psychiatric: Mood and affect are normal. Speech and behavior are normal. Patient exhibits appropriate insight and judgment.  ____________________________________________   EKG I, Governor Rooks, MD, the attending physician have personally viewed and interpreted all ECGs.  97 bpm. Normal sinus rhythm. Narrow QRS. Normal axis. Normal ST and T-wave ____________________________________________  LABS (pertinent positives/negatives)  White blood count 14.5, hemoglobin 13.7, platelet count 272 Comprehensive metabolic panel without significant abnormalities Troponin less than 0.03 Lipase 28  ____________________________________________  RADIOLOGY All Xrays were viewed by me. Imaging interpreted by Radiologist.  None __________________________________________  PROCEDURES  Procedure(s) performed: None  Critical Care performed: None  ____________________________________________   ED COURSE / ASSESSMENT AND PLAN  Pertinent labs & imaging results that were available during my care of the patient were reviewed by me and considered in my medical decision making (see chart for details).    Patient's spouse states that she's had multiple episodes just like this where she gets  epigastric pain associated with nausea and vomiting and what makes her better is a bag of fluids, nausea, and pain medicine.  Surely enough after 1 dose of Zofran, morphine and 1 L normal saline, patient feels improved and her abdomen is benign.   She is okay for discharge.    CONSULTATIONS:   None   Patient / Family / Caregiver informed of clinical course, medical decision-making process, and agree with plan.   I discussed return precautions, follow-up instructions, and discharged instructions with patient and/or family.   ___________________________________________   FINAL CLINICAL IMPRESSION(S) / ED DIAGNOSES   Final diagnoses:  Nausea  Epigastric pain              Note: This dictation was prepared with Dragon dictation. Any transcriptional errors that result from this process are unintentional   Governor Rooks, MD 12/26/15 2121

## 2015-12-26 NOTE — ED Notes (Signed)
Pt arrived via EMS from home where husband reports pt began vomiting today and became increasingly disoriented. Pt was able to ambulate on scene but presents to ED with increased weakness and is not responding to staff other than with nods. Pt was seen in ED 2 weeks ago for similar event and was seen in ED on Saturday for chest pain and SOB. Unknown baseline. Pt alert and oriented.

## 2015-12-26 NOTE — Discharge Instructions (Signed)
You were evaluated for nausea and upper abdominal pain, and although no certain cause was found, your exam and evaluation in the emergency department are reassuring tonight.  Return to the emergency room for any worsening condition including worsening pain, black or bloody vomiting, concern for dehydration, or any other symptoms concerning to you.   Abdominal Pain, Adult Many things can cause belly (abdominal) pain. Most times, the belly pain is not dangerous. Many cases of belly pain can be watched and treated at home. HOME CARE   Do not take medicines that help you go poop (laxatives) unless told to by your doctor.  Only take medicine as told by your doctor.  Eat or drink as told by your doctor. Your doctor will tell you if you should be on a special diet. GET HELP IF:  You do not know what is causing your belly pain.  You have belly pain while you are sick to your stomach (nauseous) or have runny poop (diarrhea).  You have pain while you pee or poop.  Your belly pain wakes you up at night.  You have belly pain that gets worse or better when you eat.  You have belly pain that gets worse when you eat fatty foods.  You have a fever. GET HELP RIGHT AWAY IF:   The pain does not go away within 2 hours.  You keep throwing up (vomiting).  The pain changes and is only in the right or left part of the belly.  You have bloody or tarry looking poop. MAKE SURE YOU:   Understand these instructions.  Will watch your condition.  Will get help right away if you are not doing well or get worse.   This information is not intended to replace advice given to you by your health care provider. Make sure you discuss any questions you have with your health care provider.   Document Released: 04/22/2008 Document Revised: 11/25/2014 Document Reviewed: 07/14/2013 Elsevier Interactive Patient Education 2016 Else[employer] Inc[employer]  Nausea and Vomiting Nausea is a sick feeling that often comes before  throwing up (vomiting). Vomiting is a reflex where stomach contents come out of your mouth. Vomiting can cause severe loss of body fluids (dehydration). Children and elderly adults can become dehydrated quickly, especially if they also have diarrhea. Nausea and vomiting are symptoms of a condition or disease. It is important to find the cause of your symptoms. CAUSES   Direct irritation of the stomach lining. This irritation can result from increased acid production (gastroesophageal reflux disease), infection, food poisoning, taking certain medicines (such as nonsteroidal anti-inflammatory drugs), alcohol use, or tobacco use.  Signals from the brain.These signals could be caused by a headache, heat exposure, an inner ear disturbance, increased pressure in the brain from injury, infection, a tumor, or a concussion, pain, emotional stimulus, or metabolic problems.  An obstruction in the gastrointestinal tract (bowel obstruction).  Illnesses such as diabetes, hepatitis, gallbladder problems, appendicitis, kidney problems, cancer, sepsis, atypical symptoms of a heart attack, or eating disorders.  Medical treatments such as chemotherapy and radiation.  Receiving medicine that makes you sleep (general anesthetic) during surgery. DIAGNOSIS Your caregiver may ask for tests to be done if the problems do not improve after a few days. Tests may also be done if symptoms are severe or if the reason for the nausea and vomiting is not clear. Tests may include:  Urine tests.  Blood tests.  Stool tests.  Cultures (to look for evidence of infection).  X-rays or  other imaging studies. Test results can help your caregiver make decisions about treatment or the need for additional tests. TREATMENT You need to stay well hydrated. Drink frequently but in small amounts.You may wish to drink water, sports drinks, clear broth, or eat frozen ice pops or gelatin dessert to help stay hydrated.When you eat, eating  slowly may help prevent nausea.There are also some antinausea medicines that may help prevent nausea. HOME CARE INSTRUCTIONS   Take all medicine as directed by your caregiver.  If you do not have an appetite, do not force yourself to eat. However, you must continue to drink fluids.  If you have an appetite, eat a normal diet unless your caregiver tells you differently.  Eat a variety of complex carbohydrates (rice, wheat, potatoes, bread), lean meats, yogurt, fruits, and vegetables.  Avoid high-fat foods because they are more difficult to digest.  Drink enough water and fluids to keep your urine clear or pale yellow.  If you are dehydrated, ask your caregiver for specific rehydration instructions. Signs of dehydration may include:  Severe thirst.  Dry lips and mouth.  Dizziness.  Dark urine.  Decreasing urine frequency and amount.  Confusion.  Rapid breathing or pulse. SEEK IMMEDIATE MEDICAL CARE IF:   You have blood or brown flecks (like coffee grounds) in your vomit.  You have black or bloody stools.  You have a severe headache or stiff neck.  You are confused.  You have severe abdominal pain.  You have chest pain or trouble breathing.  You do not urinate at least once every 8 hours.  You develop cold or clammy skin.  You continue to vomit for longer than 24 to 48 hours.  You have a fever. MAKE SURE YOU:   Understand these instructions.  Will watch your condition.  Will get help right away if you are not doing well or get worse.   This information is not intended to replace advice given to you by your health care provider. Make sure you discuss any questions you have with your health care provider.   Document Released: 11/04/2005 Document Revised: 01/27/2012 Document Reviewed: 04/03/2011 Elsevier Interactive Patient Education [employer]

## 2015-12-29 ENCOUNTER — Encounter: Payer: Self-pay | Admitting: Physician Assistant

## 2015-12-29 ENCOUNTER — Ambulatory Visit (INDEPENDENT_AMBULATORY_CARE_PROVIDER_SITE_OTHER): Payer: Medicare Other | Admitting: Physician Assistant

## 2015-12-29 VITALS — BP 134/82 | HR 67 | Ht 66.0 in | Wt 219.5 lb

## 2015-12-29 DIAGNOSIS — Z8673 Personal history of transient ischemic attack (TIA), and cerebral infarction without residual deficits: Secondary | ICD-10-CM

## 2015-12-29 DIAGNOSIS — I3139 Other pericardial effusion (noninflammatory): Secondary | ICD-10-CM

## 2015-12-29 DIAGNOSIS — I319 Disease of pericardium, unspecified: Secondary | ICD-10-CM | POA: Diagnosis not present

## 2015-12-29 DIAGNOSIS — R079 Chest pain, unspecified: Secondary | ICD-10-CM | POA: Diagnosis not present

## 2015-12-29 DIAGNOSIS — I313 Pericardial effusion (noninflammatory): Secondary | ICD-10-CM

## 2015-12-29 DIAGNOSIS — I10 Essential (primary) hypertension: Secondary | ICD-10-CM | POA: Diagnosis not present

## 2015-12-29 MED ORDER — FUROSEMIDE 20 MG PO TABS: 20.0000 mg | ORAL_TABLET | Freq: Every day | ORAL | Status: DC

## 2015-12-29 NOTE — Patient Instructions (Addendum)
Medication Instructions:  Your physician has recommended you make the following change in your medication:  START lasix  daily   Labwork: BMET today BMET next week  Testing/Procedures: Your physician has requested that you have an echocardiogram in two months. Echocardiography is a painless test that uses sound waves to create images of your heart. It provides your doctor with information about the size and shape of your heart and how well your heart's chambers and valves are working. This procedure takes approximately one hour. There are no restrictions for this procedure.  Your physician has requested that you have a lexiscan myoview. For further information please visit [email or web address] Please follow instruction sheet, as given.  [facility] MYOVIEW  Your caregiver has ordered a Stress Test with nuclear imaging. The purpose of this test is to evaluate the blood supply to your heart muscle. This procedure is referred to as a "Non-Invasive Stress Test." This is because other than having an IV started in your vein, nothing is inserted or "invades" your body. Cardiac stress tests are done to find areas of poor blood flow to the heart by determining the extent of coronary artery disease (CAD). Some patients exercise on a treadmill, which naturally increases the blood flow to your heart, while others who are  unable to walk on a treadmill due to physical limitations have a pharmacologic/chemical stress agent called Lexiscan . This medicine will mimic walking on a treadmill by temporarily increasing your coronary blood flow.   Please note: these test may take anywhere between 2-4 hours to complete  PLEASE REPORT TO [facility] MEDICAL MALL ENTRANCE  THE VOLUNTEERS AT THE FIRST DESK WILL DIRECT YOU WHERE TO GO  Date of Procedure:____Monday, Feb 13________  Arrival Time for Procedure:_____7:15am____________  Instructions regarding medication:    __xx__:  Hold coreg the night before procedure and  morning of procedure  ____:  Hold other medications as follows:_________________________________________________________________________________________________________________________________________________________________________________________________________________________________________________________________________________________  PLEASE NOTIFY THE OFFICE AT LEAST 24 HOURS IN ADVANCE IF YOU ARE UNABLE TO KEEP YOUR APPOINTMENT.  [phone] AND  PLEASE NOTIFY NUCLEAR MEDICINE AT [facility] AT LEAST 24 HOURS IN ADVANCE IF YOU ARE UNABLE TO KEEP YOUR APPOINTMENT. [phone]  How to prepare for your Myoview test:   Do not eat or drink after midnight  No caffeine for 24 hours prior to test  No smoking 24 hours prior to test.  Your medication may be taken with water.  If your doctor stopped a medication because of this test, do not take that medication.  Ladies, please do not wear dresses.  Skirts or pants are appropriate. Please wear a short sleeve shirt.  No perfume, cologne or lotion.  Wear comfortable walking shoes. No heels!           Follow-Up: Your physician recommends that you schedule a follow-up appointment in: one month with Dr. Mariah Milling.   Any Other Special Instructions Will Be Listed Below (If Applicable).     If you need a refill on your cardiac medications before your next appointment, please call your pharmacy.  Echocardiogram An echocardiogram, or echocardiography, uses sound waves (ultrasound) to produce an image of your heart. The echocardiogram is simple, painless, obtained within a short period of time, and offers valuable information to your health care provider. The images from an echocardiogram can provide information such as:  Evidence of coronary artery disease (CAD).  Heart size.  Heart muscle function.  Heart valve function.  Aneurysm detection.  Evidence of a past heart attack.  Fluid buildup around the  heart.  Heart muscle  thickening.  Assess heart valve function. LET [facility] CARE PROVIDER KNOW ABOUT:  Any allergies you have.  All medicines you are taking, including vitamins, herbs, eye drops, creams, and over-the-counter medicines.  Previous problems you or members of your family have had with the use of anesthetics.  Any blood disorders you have.  Previous surgeries you have had.  Medical conditions you have.  Possibility of pregnancy, if this applies. BEFORE THE PROCEDURE  No special preparation is needed. Eat and drink normally.  PROCEDURE   In order to produce an image of your heart, gel will be applied to your chest and a wand-like tool (transducer) will be moved over your chest. The gel will help transmit the sound waves from the transducer. The sound waves will harmlessly bounce off your heart to allow the heart images to be captured in real-time motion. These images will then be recorded.  You may need an IV to receive a medicine that improves the quality of the pictures. AFTER THE PROCEDURE You may return to your normal schedule including diet, activities, and medicines, unless your health care provider tells you otherwise.   This information is not intended to replace advice given to you by your health care provider. Make sure you discuss any questions you have with your health care provider.   Document Released: 11/01/2000 Document Revised: 11/25/2014 Document Reviewed: 07/12/2013 Elsevier Interactive Patient Education 2016 Elsevier Inc. Cardiac Nuclear Scanning A cardiac nuclear scan is used to check your heart for problems, such as the following:  A portion of the heart is not getting enough blood.  Part of the heart muscle has died, which happens with a heart attack.  The heart wall is not working normally.  In this test, a radioactive dye (tracer) is injected into your bloodstream. After the tracer has traveled to your heart, a scanning device is used to measure how much of  the tracer is absorbed by or distributed to various areas of your heart. LET [facility] CARE PROVIDER KNOW ABOUT:  Any allergies you have.  All medicines you are taking, including vitamins, herbs, eye drops, creams, and over-the-counter medicines.  Previous problems you or members of your family have had with the use of anesthetics.  Any blood disorders you have.  Previous surgeries you have had.  Medical conditions you have.  RISKS AND COMPLICATIONS Generally, this is a safe procedure. However, as with any procedure, problems can occur. Possible problems include:   Serious chest pain.  Rapid heartbeat.  Sensation of warmth in your chest. This usually passes quickly. BEFORE THE PROCEDURE Ask your health care provider about changing or stopping your regular medicines. PROCEDURE This procedure is usually done at a hospital and takes 2-4 hours.  An IV tube is inserted into one of your veins.  Your health care provider will inject a small amount of radioactive tracer through the tube.  You will then wait for 20-40 minutes while the tracer travels through your bloodstream.  You will lie down on an exam table so images of your heart can be taken. Images will be taken for about 15-20 minutes.  You will exercise on a treadmill or stationary bike. While you exercise, your heart activity will be monitored with an electrocardiogram (ECG), and your blood pressure will be checked.  If you are unable to exercise, you may be given a medicine to make your heart beat faster.  When blood flow to your heart has peaked, tracer will  again be injected through the IV tube.  After 20-40 minutes, you will get back on the exam table and have more images taken of your heart.  When the procedure is over, your IV tube will be removed. AFTER THE PROCEDURE  You will likely be able to leave shortly after the test. Unless your health care provider tells you otherwise, you may return to your normal  schedule, including diet, activities, and medicines.  Make sure you find out how and when you will get your test results.   This information is not intended to replace advice given to you by your health care provider. Make sure you discuss any questions you have with your health care provider.   Document Released: 11/29/2004 Document Revised: 11/09/2013 Document Reviewed: 10/13/2013 Elsevier Interactive Patient Education [employer]

## 2015-12-29 NOTE — Progress Notes (Signed)
Cardiology Office Note Date:  12/29/2015  Patient ID:  Cyanne, Delmar Aug 05, 1951, MRN [id number] PCP:  Jaclyn Shaggy, MD  Cardiologist:  Dr. Kirke Corin, MD    Chief Complaint: Hospital follow up for chest pain  History of Present Illness: VALLERI HENDRICKSEN is a 65 y.o. female with history of TIA/stroke, DM2, HTN,  HLD, and long history of nausea/vomting/diarrhea without etiology who presents for hospital follow up of chest pain at rest.   She was previously followed by Dr. Park Breed while inpatient but has not followed up with him as an outpatient. She was admitted to [facility] in 11/2001 for chest pain Dr. Juliann Pares performed a cardiac cath that did not show any significant coroanry disease. She was again admitted to [facility] in 2004 for chest pain and had a nuclear stress test that did not show any ischemia. She also had a normal echo. She has had off-and-on admissions over the years for abdominal pain and C diff infections. No further ischemic evaluations since 2004. She has not followed up with a cardiologist since her admissions above.   Admitted to [facility] 2/5 with two episodes of chest pain occuring at rest. No associated symptoms. Negative cardiac enzymes and non-acute ECG and CXR. Echo showed EF 60-65%, no RWMA, GR1DD, PASP 47 mm Hg, and mild to moderate posterior pericardial effusion without evidence of hemodynamic compromise. She remained chest pain free throughout her admission.   She has remained chest pain free since her admission to [facility]. She did return back to the ED the day after her discharge for her usual nausea, vomiting, and diarrhea. She reports a history of being able to eat only certain fishes and cereal only every other day o/w she develops N/V/D. Work up thus far has been unrevealing. She has never tried lactose free products. Otherwise, from a cardiac standpoint she has been doing well. No SOB, palpitations, lower extremity edema, orthopnea, presyncope, or syncope.    Past Medical History    Diagnosis Date  . Diabetes mellitus   . Hypertension   . Stroke (HCC)   . Arthritis   . GERD (gastroesophageal reflux disease)   . Mini stroke (HCC)   . Asthma   . Hyperlipidemia     Past Surgical History  Procedure Laterality Date  . Abdominal hysterectomy    . Cholecystectomy    . Cardiac catheterization      Current Outpatient Prescriptions  Medication Sig Dispense Refill  . carvedilol (COREG) 6.25 MG tablet Take 6.25 mg by mouth 2 (two) times daily with a meal.    . clopidogrel (PLAVIX) 75 MG tablet Take 75 mg by mouth daily.    Marland Kitchen omeprazole (PRILOSEC) 20 MG capsule Take 20 mg by mouth daily.    . simvastatin (ZOCOR) 40 MG tablet Take 40 mg by mouth every evening.    . sulfacetamide (BLEPH-10) 10 % ophthalmic solution Place 2 drops into both eyes 4 (four) times daily.    . furosemide (LASIX) 20 MG tablet Take 1 tablet (20 mg total) by mouth daily. 30 tablet 3   No current facility-administered medications for this visit.    Allergies:   Latex and Lidocaine   Social History:  The patient  reports that she has never smoked. She does not have any smokeless tobacco history on file. She reports that she does not drink alcohol or use illicit drugs.   Family History:  The patient's family history includes Heart Problems in her father.  ROS:   Review of  Systems  Constitutional: Negative for fever, chills, weight loss, malaise/fatigue and diaphoresis.  HENT: Negative for congestion.   Eyes: Negative for discharge and redness.  Respiratory: Negative for cough, hemoptysis, sputum production, shortness of breath and wheezing.   Cardiovascular: Negative for chest pain, palpitations, orthopnea, claudication, leg swelling and PND.  Gastrointestinal: Positive for nausea, vomiting, abdominal pain and diarrhea. Negative for heartburn, constipation, blood in stool and melena.  Genitourinary: Negative for hematuria.  Musculoskeletal: Negative for myalgias, back pain, joint pain, falls  and neck pain.  Skin: Negative for rash.  Neurological: Negative for dizziness, tingling, tremors, sensory change, speech change, focal weakness, loss of consciousness and weakness.  Endo/Heme/Allergies: Does not bruise/bleed easily.  Psychiatric/Behavioral: Negative for substance abuse. The patient is not nervous/anxious.   All other systems reviewed and are negative.   PHYSICAL EXAM:  VS:  BP 134/82 mmHg  Pulse 67  Ht  (1.676 m)  Wt 219 lb 8 oz (99.565 kg)  BMI 35.45 kg/m2 BMI: Body mass index is 35.45 kg/(m^2). Well nourished, well developed, in no acute distress HEENT: normocephalic, atraumatic Neck: no JVD, carotid bruits or masses Cardiac:  normal S1, S2; RRR; no murmurs, rubs, or gallops Lungs:  clear to auscultation bilaterally, no wheezing, rhonchi or rales Abd: obese, soft, nontender, no hepatomegaly, + BS MS: no deformity or atrophy Ext: no edema Skin: warm and dry, no rash Neuro:  moves all extremities spontaneously, no focal abnormalities noted, follows commands Psych: euthymic mood, full affect   EKG:  Was ordered today. Shows NSR, 67 bpm, no significant st/t changes   Pulsus Paradoxus: 9  Recent Labs: 12/26/2015: ALT 16; BUN 19; Creatinine, Ser 0.94; Hemoglobin 13.7; Platelets 272; Potassium 4.4; Sodium 139  No results found for requested labs within last 365 days.   Estimated Creatinine Clearance: 72 mL/min (by C-G formula based on Cr of 0.94).   Wt Readings from Last 3 Encounters:  12/29/15 219 lb 8 oz (99.565 kg)  12/26/15 222 lb (100.699 kg)  12/24/15 220 lb 8 oz (100.018 kg)     Other studies reviewed: Additional studies/records reviewed today include: summarized above  ASSESSMENT AND PLAN:  1. Chest pain: Schedule Lexiscan Myoview to evaluate for high risk ischemia. Consider adding Imdur 30 mg daily. Continue Plavix 75  mg daily, she was already on this secondary to prior TIA/stroke. Continue Coreg 6.25 mg bid.   2. Pericardial effusion:  Asymptomatic. No signs of hemodynamic compromise on studies or exam. Add Lasix 20 mg daily. Recheck  echo in 2 months.   3. HTN: BP runs in the 130s to 140s systolic at home. Add Lasix as above. Check bmet today. Recheck bmet in 1 week. Continue  Coreg. Weight loss advised.   4. History of TIA/stroke: On Plavix per PCP.   5. DM2: Per PCP.   Disposition: F/u with Dr. Mariah Milling status post stress test  Current medicines are reviewed at length with the patient today.  The patient did not have any concerns regarding medicines.  Elinor Dodge PA-C 12/29/2015 10:28 AM     [facility] [place] [address] [place], [place] [address] [phone]

## 2015-12-30 LAB — BASIC METABOLIC PANEL
BUN / CREAT RATIO: 13 (ref 11–26)
BUN: 9 mg/dL (ref 8–27)
CO2: 21 mmol/L (ref 18–29)
Calcium: 9.6 mg/dL (ref 8.7–10.3)
Chloride: 102 mmol/L (ref 96–106)
Creatinine, Ser: 0.7 mg/dL (ref 0.57–1.00)
GFR, EST AFRICAN AMERICAN: 106 mL/min/{1.73_m2} (ref >59)
GFR, EST NON AFRICAN AMERICAN: 92 mL/min/{1.73_m2} (ref >59)
Glucose: 111 mg/dL — ABNORMAL HIGH (ref 65–99)
POTASSIUM: 4.3 mmol/L (ref 3.5–5.2)
SODIUM: 141 mmol/L (ref 134–144)

## 2016-01-01 ENCOUNTER — Ambulatory Visit
Admission: RE | Admit: 2016-01-01 | Discharge: 2016-01-01 | Disposition: A | Discharge status: Home / Self Care | Payer: Medicare Other | Source: Ambulatory Visit | Attending: Physician Assistant | Admitting: Physician Assistant

## 2016-01-01 DIAGNOSIS — R079 Chest pain, unspecified: Secondary | ICD-10-CM | POA: Insufficient documentation

## 2016-01-01 LAB — NM MYOCAR MULTI W/SPECT W/WALL MOTION / EF
CHL CUP NUCLEAR SDS: 0
CHL CUP NUCLEAR SRS: 2
CHL CUP RESTING HR STRESS: 70 {beats}/min
CHL CUP STRESS STAGE 2 DBP: 74 mmHg
CHL CUP STRESS STAGE 2 GRADE: 0 %
CHL CUP STRESS STAGE 2 HR: 68 {beats}/min
CHL CUP STRESS STAGE 3 HR: 68 {beats}/min
CHL CUP STRESS STAGE 4 HR: 109 {beats}/min
CHL CUP STRESS STAGE 5 GRADE: 0 %
CHL CUP STRESS STAGE 5 HR: 107 {beats}/min
CHL CUP STRESS STAGE 5 SPEED: 0 mph
CHL CUP STRESS STAGE 6 DBP: 74 mmHg
CHL CUP STRESS STAGE 6 GRADE: 0 %
CSEPEW: 1 METS
CSEPHR: 71 %
CSEPPHR: 109 {beats}/min
CSEPPMHR: 69 %
LVDIAVOL: 62 mL
LVSYSVOL: 24 mL
NUC STRESS TID: 1.09
SSS: 0
Stage 1 HR: 75 {beats}/min
Stage 2 SBP: 136 mmHg
Stage 2 Speed: 0 mph
Stage 3 Grade: 0 %
Stage 3 Speed: 0 mph
Stage 4 Grade: 0 %
Stage 4 Speed: 0 mph
Stage 6 HR: 83 {beats}/min
Stage 6 SBP: 140 mmHg
Stage 6 Speed: 0 mph

## 2016-01-01 MED ORDER — TECHNETIUM TC 99M SESTAMIBI - CARDIOLITE
32.7720 | Freq: Once | INTRAVENOUS | Status: AC | PRN
  Administered 2016-01-01: 09:00:00 32.772 via INTRAVENOUS

## 2016-01-01 MED ORDER — REGADENOSON 0.4 MG/5ML IV SOLN
0.4000 mg | Freq: Once | INTRAVENOUS | Status: AC
  Administered 2016-01-01: 0.4 mg via INTRAVENOUS

## 2016-01-01 MED ORDER — TECHNETIUM TC 99M SESTAMIBI - CARDIOLITE
12.5190 | Freq: Once | INTRAVENOUS | Status: AC | PRN
  Administered 2016-01-01: 08:00:00 12.519 via INTRAVENOUS

## 2016-01-05 ENCOUNTER — Other Ambulatory Visit: Payer: BC Managed Care – PPO

## 2016-01-05 ENCOUNTER — Other Ambulatory Visit
Admission: RE | Admit: 2016-01-05 | Discharge: 2016-01-05 | Disposition: A | Discharge status: Home / Self Care | Payer: Medicare Other | Source: Ambulatory Visit | Attending: Physician Assistant | Admitting: Physician Assistant

## 2016-01-05 DIAGNOSIS — R079 Chest pain, unspecified: Secondary | ICD-10-CM

## 2016-01-05 LAB — BASIC METABOLIC PANEL
ANION GAP: 9 (ref 5–15)
BUN: 8 mg/dL (ref 6–20)
CHLORIDE: 102 mmol/L (ref 101–111)
CO2: 29 mmol/L (ref 22–32)
Calcium: 9.5 mg/dL (ref 8.9–10.3)
Creatinine, Ser: 0.71 mg/dL (ref 0.44–1.00)
Glucose, Bld: 179 mg/dL — ABNORMAL HIGH (ref 65–99)
POTASSIUM: 3.9 mmol/L (ref 3.5–5.1)
SODIUM: 140 mmol/L (ref 135–145)

## 2016-02-02 ENCOUNTER — Encounter: Payer: Self-pay | Admitting: Cardiovascular Disease

## 2016-02-02 ENCOUNTER — Ambulatory Visit (INDEPENDENT_AMBULATORY_CARE_PROVIDER_SITE_OTHER): Payer: Medicare Other | Admitting: Cardiovascular Disease

## 2016-02-02 VITALS — BP 130/81 | HR 83 | Ht 66.0 in | Wt 219.5 lb

## 2016-02-02 DIAGNOSIS — I10 Essential (primary) hypertension: Secondary | ICD-10-CM

## 2016-02-02 DIAGNOSIS — E119 Type 2 diabetes mellitus without complications: Secondary | ICD-10-CM | POA: Diagnosis not present

## 2016-02-02 DIAGNOSIS — R079 Chest pain, unspecified: Secondary | ICD-10-CM

## 2016-02-02 NOTE — Patient Instructions (Signed)
You are doing well. No medication changes were made.  Please call us if you have new issues that need to be addressed before your next appt.  Your physician wants you to follow-up in: 12 months.  You will receive a reminder letter in the mail two months in advance. If you don't receive a letter, please call our office to schedule the follow-up appointment. 

## 2016-02-02 NOTE — Assessment & Plan Note (Signed)
Blood pressure is well controlled on today's visit. No changes made to the medications. 

## 2016-02-02 NOTE — Progress Notes (Signed)
Patient ID: Destiny BeltonHattie S Savage, female    DOB: 04/21/1951, 65 y.o.   MRN: [id number]  HPI Comments: Destiny BeltonHattie S Savage is a 65 y.o. female with history of TIA/stroke, DM2, HTN, HLD, and long history of nausea/vomiting/diarrhea without etiology with previous symptoms of chest discomfort, who presents for routine follow-up of her chest pain symptoms  Admission to [facility] 12/24/2015 with chest pain Negative cardiac enzymes, EKG relatively unchanged  Echo showed EF 60-65%, no RWMA, GR1DD, PASP 47 mm Hg, and mild to moderate posterior pericardial effusion without evidence of hemodynamic compromise.  She remained chest pain free throughout her admission.   Seen by our clinic last month, scheduled for stress test She presents today for further discussion. This showed no ischemia normal ejection fraction She denies any further episodes of chest pain  She continues to have severe GI issues, chronic diarrhea She has [facility], continues to have symptoms  Other past medical history admitted to [facility] in 11/2001 for chest pain Dr. Juliann Paresallwood performed a cardiac cath that did not show any significant coroanry disease.  She was again admitted to [facility] in 2004 for chest pain and had a nuclear stress test that did not show any ischemia.   normal echo.  She has had off-and-on admissions over the years for abdominal pain and C diff infections    Allergies  Allergen Reactions  . Latex Hives  . Lidocaine Hives    Current Outpatient Prescriptions on File Prior to Visit  Medication Sig Dispense Refill  . carvedilol (COREG) 6.25 MG tablet Take 6.25 mg by mouth 2 (two) times daily with a meal.    . clopidogrel (PLAVIX) 75 MG tablet Take 75 mg by mouth daily.    . furosemide (LASIX) 20 MG tablet Take 1 tablet (20 mg total) by mouth daily. 30 tablet 3  . omeprazole (PRILOSEC) 20 MG capsule Take 20 mg by mouth daily.    . simvastatin (ZOCOR) 40 MG tablet Take 40 mg by mouth every evening.    . sulfacetamide  (BLEPH-10) 10 % ophthalmic solution Place 1 drop into both eyes every other day.      No current facility-administered medications on file prior to visit.    Past Medical History  Diagnosis Date  . Diabetes mellitus   . Hypertension   . Stroke (HCC)   . Arthritis   . GERD (gastroesophageal reflux disease)   . Mini stroke (HCC)   . Asthma   . Hyperlipidemia     Past Surgical History  Procedure Laterality Date  . Abdominal hysterectomy    . Cholecystectomy    . Cardiac catheterization      Social History  reports that she has never smoked. She does not have any smokeless tobacco history on file. She reports that she does not drink alcohol or use illicit drugs.  Family History family history includes Heart Problems in her father.    Review of Systems  Constitutional: Negative.   Respiratory: Negative.   Cardiovascular: Negative.   Gastrointestinal: Negative.   Musculoskeletal: Negative.   Neurological: Negative.   Hematological: Negative.   Psychiatric/Behavioral: Negative.   All other systems reviewed and are negative.   BP 130/81 mmHg  Pulse 83  Ht 5\' 6"  (1.676 m)  Wt 219 lb 8 oz (99.565 kg)  BMI 35.45 kg/m2  Physical Exam  Constitutional: She is oriented to person, place, and time. She appears well-developed and well-nourished.  HENT:  Head: Normocephalic.  Nose: Nose normal.  Mouth/Throat:  Oropharynx is clear and moist.  Eyes: Conjunctivae are normal. Pupils are equal, round, and reactive to light.  Neck: Normal range of motion. Neck supple. No JVD present.  Cardiovascular: Normal rate, regular rhythm, normal heart sounds and intact distal pulses.  Exam reveals no gallop and no friction rub.   No murmur heard. Pulmonary/Chest: Effort normal and breath sounds normal. No respiratory distress. She has no wheezes. She has no rales. She exhibits no tenderness.  Abdominal: Soft. Bowel sounds are normal. She exhibits no distension. There is no tenderness.   Musculoskeletal: Normal range of motion. She exhibits no edema or tenderness.  Lymphadenopathy:    She has no cervical adenopathy.  Neurological: She is alert and oriented to person, place, and time. Coordination normal.  Skin: Skin is warm and dry. No rash noted. No erythema.  Psychiatric: She has a normal mood and affect. Her behavior is normal. Judgment and thought content normal.

## 2016-02-02 NOTE — Assessment & Plan Note (Signed)
Atypical chest pain, recent normal stress test. No further testing needed at this time

## 2016-02-02 NOTE — Assessment & Plan Note (Signed)
We have encouraged continued exercise, careful diet management in an effort to lose weight. 

## 2016-02-12 ENCOUNTER — Other Ambulatory Visit: Payer: Self-pay | Admitting: Physician Assistant

## 2016-02-12 DIAGNOSIS — I313 Pericardial effusion (noninflammatory): Secondary | ICD-10-CM

## 2016-02-12 DIAGNOSIS — R079 Chest pain, unspecified: Secondary | ICD-10-CM

## 2016-02-12 DIAGNOSIS — I3139 Other pericardial effusion (noninflammatory): Secondary | ICD-10-CM

## 2016-02-26 ENCOUNTER — Ambulatory Visit (INDEPENDENT_AMBULATORY_CARE_PROVIDER_SITE_OTHER): Payer: Medicare Other

## 2016-02-26 ENCOUNTER — Other Ambulatory Visit: Payer: Self-pay

## 2016-02-26 DIAGNOSIS — I319 Disease of pericardium, unspecified: Secondary | ICD-10-CM

## 2016-02-26 DIAGNOSIS — R079 Chest pain, unspecified: Secondary | ICD-10-CM | POA: Diagnosis not present

## 2016-02-26 DIAGNOSIS — I3139 Other pericardial effusion (noninflammatory): Secondary | ICD-10-CM

## 2016-02-26 DIAGNOSIS — I313 Pericardial effusion (noninflammatory): Secondary | ICD-10-CM

## 2016-04-22 ENCOUNTER — Other Ambulatory Visit: Payer: Self-pay | Admitting: Physician Assistant

## 2016-04-23 ENCOUNTER — Emergency Department
Admission: EM | Admit: 2016-04-23 | Discharge: 2016-04-23 | Disposition: A | Discharge status: Home / Self Care | Payer: Medicare Other | Attending: Emergency Medicine | Admitting: Emergency Medicine

## 2016-04-23 ENCOUNTER — Emergency Department: Payer: Medicare Other

## 2016-04-23 DIAGNOSIS — Z91040 Latex allergy status: Secondary | ICD-10-CM | POA: Insufficient documentation

## 2016-04-23 DIAGNOSIS — K5900 Constipation, unspecified: Secondary | ICD-10-CM | POA: Insufficient documentation

## 2016-04-23 DIAGNOSIS — J45909 Unspecified asthma, uncomplicated: Secondary | ICD-10-CM | POA: Insufficient documentation

## 2016-04-23 DIAGNOSIS — E119 Type 2 diabetes mellitus without complications: Secondary | ICD-10-CM | POA: Insufficient documentation

## 2016-04-23 DIAGNOSIS — M1990 Unspecified osteoarthritis, unspecified site: Secondary | ICD-10-CM | POA: Insufficient documentation

## 2016-04-23 DIAGNOSIS — I10 Essential (primary) hypertension: Secondary | ICD-10-CM | POA: Diagnosis not present

## 2016-04-23 DIAGNOSIS — E785 Hyperlipidemia, unspecified: Secondary | ICD-10-CM | POA: Diagnosis not present

## 2016-04-23 DIAGNOSIS — Z8673 Personal history of transient ischemic attack (TIA), and cerebral infarction without residual deficits: Secondary | ICD-10-CM | POA: Diagnosis not present

## 2016-04-23 DIAGNOSIS — Z79899 Other long term (current) drug therapy: Secondary | ICD-10-CM | POA: Diagnosis not present

## 2016-04-23 LAB — CBC WITH DIFFERENTIAL/PLATELET
Basophils Absolute: 0.1 10*3/uL (ref 0–0.1)
Basophils Relative: 1 %
Eosinophils Absolute: 0.5 10*3/uL (ref 0–0.7)
Eosinophils Relative: 5 %
HEMATOCRIT: 34.5 % — AB (ref 35.0–47.0)
HEMOGLOBIN: 11.5 g/dL — AB (ref 12.0–16.0)
LYMPHS ABS: 2.5 10*3/uL (ref 1.0–3.6)
LYMPHS PCT: 28 %
MCH: 29.5 pg (ref 26.0–34.0)
MCHC: 33.3 g/dL (ref 32.0–36.0)
MCV: 88.7 fL (ref 80.0–100.0)
MONOS PCT: 6 %
Monocytes Absolute: 0.5 10*3/uL (ref 0.2–0.9)
NEUTROS PCT: 60 %
Neutro Abs: 5.3 10*3/uL (ref 1.4–6.5)
Platelets: 233 10*3/uL (ref 150–440)
RBC: 3.89 MIL/uL (ref 3.80–5.20)
RDW: 13.6 % (ref 11.5–14.5)
WBC: 8.8 10*3/uL (ref 3.6–11.0)

## 2016-04-23 LAB — COMPREHENSIVE METABOLIC PANEL
ALT: 10 U/L — ABNORMAL LOW (ref 14–54)
ANION GAP: 10 (ref 5–15)
AST: 19 U/L (ref 15–41)
Albumin: 4 g/dL (ref 3.5–5.0)
Alkaline Phosphatase: 74 U/L (ref 38–126)
BUN: 15 mg/dL (ref 6–20)
CALCIUM: 9.7 mg/dL (ref 8.9–10.3)
CHLORIDE: 102 mmol/L (ref 101–111)
CO2: 27 mmol/L (ref 22–32)
Creatinine, Ser: 0.81 mg/dL (ref 0.44–1.00)
GFR calc non Af Amer: >60 mL/min (ref >60)
Glucose, Bld: 131 mg/dL — ABNORMAL HIGH (ref 65–99)
Potassium: 4 mmol/L (ref 3.5–5.1)
SODIUM: 139 mmol/L (ref 135–145)
Total Bilirubin: 0.5 mg/dL (ref 0.3–1.2)
Total Protein: 6.8 g/dL (ref 6.5–8.1)

## 2016-04-23 LAB — LIPASE, BLOOD: Lipase: 42 U/L (ref 11–51)

## 2016-04-23 MED ORDER — DOCUSATE SODIUM 100 MG PO CAPS: 100.0000 mg | ORAL_CAPSULE | Freq: Every day | ORAL | Status: AC | PRN

## 2016-04-23 MED ORDER — LACTULOSE 20 G PO PACK: 20.0000 g | PACK | Freq: Three times a day (TID) | ORAL | Status: DC

## 2016-04-23 NOTE — ED Provider Notes (Signed)
[facility] Emergency Department Provider Note  ____________________________________________   I have reviewed the triage vital signs and the nursing notes.   HISTORY  Chief Complaint Constipation    HPI Destiny Savage is a 65 y.o. female history of chronic recurrent constipation so she's been constipated for the last 3 weeks. She has been trying different over-the-counter medications including MiraLAX but has not had any satisfying bowel movement since that time. She denies any abdominal pain or vomiting. She has no chest pressures of breath. She states this is not atypical for her. She denies any rectal bleeding or fever or chills. Her only complaint is recurrent on acute on chronic constipation. She has followed extensively with GI medicine for this and has had colonoscopies and endoscopies.      Past Medical History  Diagnosis Date  . Diabetes mellitus   . Hypertension   . Stroke (HCC)   . Arthritis   . GERD (gastroesophageal reflux disease)   . Mini stroke (HCC)   . Asthma   . Hyperlipidemia     Patient Active Problem List   Diagnosis Date Noted  . Chest pain 12/23/2015  . GERD (gastroesophageal reflux disease) 12/23/2015  . Vertigo 05/11/2012  . Hypertension 05/11/2012  . Diabetes type 2, controlled (HCC) 05/11/2012  . Neck pain on right side 05/11/2012    Past Surgical History  Procedure Laterality Date  . Abdominal hysterectomy    . Cholecystectomy    . Cardiac catheterization      Current Outpatient Rx  Name  Route  Sig  Dispense  Refill  . carvedilol (COREG) 6.25 MG tablet   Oral   Take 6.25 mg by mouth 2 (two) times daily with a meal.         . clopidogrel (PLAVIX) 75 MG tablet   Oral   Take 75 mg by mouth daily.         . furosemide (LASIX) 20 MG tablet      TAKE ONE TABLET BY MOUTH ONCE DAILY   30 tablet   6   . omeprazole (PRILOSEC) 20 MG capsule   Oral   Take 20 mg by mouth daily.         . simvastatin  (ZOCOR) 40 MG tablet   Oral   Take 40 mg by mouth every evening.         . sulfacetamide (BLEPH-10) 10 % ophthalmic solution   Both Eyes   Place 1 drop into both eyes every other day.            Allergies Latex and Lidocaine  Family History  Problem Relation Age of Onset  . Hypertension    . CAD    . Heart Problems Father     Social History Social History  Substance Use Topics  . Smoking status: Never Smoker   . Smokeless tobacco: Not on file  . Alcohol Use: No    Review of Systems Constitutional: No fever/chills Eyes: No visual changes. ENT: No sore throat. No stiff neck no neck pain Cardiovascular: Denies chest pain. Respiratory: Denies shortness of breath. Gastrointestinal:   no vomiting.  No diarrhea.  Positive constipation. Genitourinary: Negative for dysuria. Musculoskeletal: Negative lower extremity swelling Skin: Negative for rash. Neurological: Negative for headaches, focal weakness or numbness. 10-point ROS otherwise negative.  ____________________________________________   PHYSICAL EXAM:  VITAL SIGNS: ED Triage Vitals  Enc Vitals Group     BP 04/23/16 0551 162/91 mmHg     Pulse Rate  04/23/16 0549 69     Resp 04/23/16 0549 18     Temp 04/23/16 0549 98.4 F (36.9 C)     Temp Source 04/23/16 0549 Oral     SpO2 04/23/16 0549 97 %     Weight 04/23/16 0549 213 lb (96.616 kg)     Height 04/23/16 0549 5\' 7"  (1.702 m)     Head Cir --      Peak Flow --      Pain Score 04/23/16 0551 8     Pain Loc --      Pain Edu? --      Excl. in GC? --     Constitutional: Alert and oriented. Well appearing and in no acute distress. Eyes: Conjunctivae are normal. PERRL. EOMI. Head: Atraumatic. Nose: No congestion/rhinnorhea. Mouth/Throat: Mucous membranes are moist.  Oropharynx non-erythematous. Neck: No stridor.   Nontender with no meningismus Cardiovascular: Normal rate, regular rhythm. Grossly normal heart sounds.  Good peripheral  circulation. Respiratory: Normal respiratory effort.  No retractions. Lungs CTAB. Abdominal: Soft and nontender. No distention. No guarding no rebound Back:  There is no focal tenderness or step off there is no midline tenderness there are no lesions noted. there is no CVA tenderness Musculoskeletal: No lower extremity tenderness. No joint effusions, no DVT signs strong distal pulses no edema Neurologic:  Normal speech and language. No gross focal neurologic deficits are appreciated.  Skin:  Skin is warm, dry and intact. No rash noted. Psychiatric: Mood and affect are normal. Speech and behavior are normal.  ____________________________________________   LABS (all labs ordered are listed, but only abnormal results are displayed)  Labs Reviewed  COMPREHENSIVE METABOLIC PANEL - Abnormal; Notable for the following:    Glucose, Bld 131 (*)    ALT 10 (*)    All other components within normal limits  CBC WITH DIFFERENTIAL/PLATELET - Abnormal; Notable for the following:    Hemoglobin 11.5 (*)    HCT 34.5 (*)    All other components within normal limits  LIPASE, BLOOD   ____________________________________________  EKG  I personally interpreted any EKGs ordered by me or triage  ____________________________________________  RADIOLOGY  I reviewed any imaging ordered by me or triage that were performed during my shift and, if possible, patient and/or family made aware of any abnormal findings. ____________________________________________   PROCEDURES  Procedure(s) performed: None  Critical Care performed: None  ____________________________________________   INITIAL IMPRESSION / ASSESSMENT AND PLAN / ED COURSE  Pertinent labs & imaging results that were available during my care of the patient were reviewed by me and considered in my medical decision making (see chart for details).  At thsi time there is no indiction that there is more  Serious pathology present. Pt has no abd  pain or vomiting vss and bloodwork reassuring.  Imaging is c/w w/ constipation without obstruction and after enema pt had a large non bloody bowel movement and feels 'great.' requesting discharge. I will refer her back to her gi doctors and start her on stool softeners.   ____________________________________________   FINAL CLINICAL IMPRESSION(S) / ED DIAGNOSES  Final diagnoses:  None      This chart was dictated using voice recognition software.  Despite best efforts to proofread,  errors can occur which can change meaning.     Jeanmarie PlantJames A Nekisha Mcdiarmid, MD 04/23/16 1145

## 2016-04-23 NOTE — ED Notes (Signed)
Pt in with co generalized abd pain no BM for 3 weeks, hx of IBS.

## 2016-04-23 NOTE — ED Notes (Signed)
Pt reports not able to have BM for 3 weeks with lower abdominal pain. Pt has hx of IBS.

## 2016-04-23 NOTE — Discharge Instructions (Signed)

## 2016-06-26 ENCOUNTER — Ambulatory Visit
Admission: RE | Admit: 2016-06-26 | Discharge: 2016-06-26 | Disposition: A | Discharge status: Home / Self Care | Payer: Medicare Other | Source: Ambulatory Visit | Attending: Internal Medicine | Admitting: Internal Medicine

## 2016-06-26 ENCOUNTER — Other Ambulatory Visit: Payer: Self-pay | Admitting: Internal Medicine

## 2016-06-26 DIAGNOSIS — S99922A Unspecified injury of left foot, initial encounter: Secondary | ICD-10-CM

## 2016-06-26 DIAGNOSIS — X58XXXA Exposure to other specified factors, initial encounter: Secondary | ICD-10-CM | POA: Diagnosis not present

## 2016-07-05 ENCOUNTER — Ambulatory Visit (INDEPENDENT_AMBULATORY_CARE_PROVIDER_SITE_OTHER): Payer: Medicare Other | Admitting: Sports Medicine

## 2016-07-05 ENCOUNTER — Encounter: Payer: Self-pay | Admitting: Sports Medicine

## 2016-07-05 DIAGNOSIS — E119 Type 2 diabetes mellitus without complications: Secondary | ICD-10-CM | POA: Diagnosis not present

## 2016-07-05 DIAGNOSIS — S93402A Sprain of unspecified ligament of left ankle, initial encounter: Secondary | ICD-10-CM

## 2016-07-05 DIAGNOSIS — M25572 Pain in left ankle and joints of left foot: Secondary | ICD-10-CM | POA: Diagnosis not present

## 2016-07-05 DIAGNOSIS — B351 Tinea unguium: Secondary | ICD-10-CM | POA: Diagnosis not present

## 2016-07-05 DIAGNOSIS — M79673 Pain in unspecified foot: Secondary | ICD-10-CM | POA: Diagnosis not present

## 2016-07-05 NOTE — Progress Notes (Signed)
Subjective: Destiny Savage is a 65 y.o. female patient with history of diabetes who presents to office today complaining of long, painful nails  while ambulating in shoes; unable to trim. Patient states that the glucose reading this morning was 132 mg/dl. Diagnosed with Diabetes 12 years ago. Admits also to falling off the porch 3 weeks ago was treated by PCP who sent her for xrays and diagnosed as sprain. Patient denies any new changes in medication or other new problems. Patient denies any new cramping, numbness, burning or tingling in the legs.  Patient Active Problem List   Diagnosis Date Noted  . Chest pain 12/23/2015  . GERD (gastroesophageal reflux disease) 12/23/2015  . Vertigo 05/11/2012  . Hypertension 05/11/2012  . Diabetes type 2, controlled ([place]) 05/11/2012  . Neck pain on right side 05/11/2012   Current Outpatient Prescriptions on File Prior to Visit  Medication Sig Dispense Refill  . carvedilol (COREG) 6.25 MG tablet Take 6.25 mg by mouth 2 (two) times daily with a meal.    . clopidogrel (PLAVIX) 75 MG tablet Take 75 mg by mouth daily.    Marland Kitchen docusate sodium (COLACE) 100 MG capsule Take 1 capsule (100 mg total) by mouth daily as needed. 30 capsule 2  . furosemide (LASIX) 20 MG tablet TAKE ONE TABLET BY MOUTH ONCE DAILY 30 tablet 6  . lactulose (CEPHULAC) 20 g packet Take 1 packet (20 g total) by mouth 3 (three) times daily. 30 each 0  . omeprazole (PRILOSEC) 20 MG capsule Take 20 mg by mouth daily.    . simvastatin (ZOCOR) 40 MG tablet Take 40 mg by mouth every evening.    . sulfacetamide (BLEPH-10) 10 % ophthalmic solution Place 1 drop into both eyes every other day.      No current facility-administered medications on file prior to visit.    Allergies  Allergen Reactions  . Latex Hives  . Lidocaine Hives    Recent Results (from the past 2160 hour(s))  Comprehensive metabolic panel     Status: Abnormal   Collection Time: 04/23/16  8:25 AM  Result Value Ref Range    Sodium 139 135 - 145 mmol/L   Potassium 4.0 3.5 - 5.1 mmol/L   Chloride 102 101 - 111 mmol/L   CO2 27 22 - 32 mmol/L   Glucose, Bld 131 (H) 65 - 99 mg/dL   BUN 15 6 - 20 mg/dL   Creatinine, Ser 0.81 0.44 - 1.00 mg/dL   Calcium 9.7 8.9 - 10.3 mg/dL   Total Protein 6.8 6.5 - 8.1 g/dL   Albumin 4.0 3.5 - 5.0 g/dL   AST 19 15 - 41 U/L   ALT 10 (L) 14 - 54 U/L   Alkaline Phosphatase 74 38 - 126 U/L   Total Bilirubin 0.5 0.3 - 1.2 mg/dL   GFR calc non Af Amer >60 >60 mL/min   GFR calc Af Amer >60 >60 mL/min    Comment: (NOTE) The eGFR has been calculated using the CKD EPI equation. This calculation has not been validated in all clinical situations. eGFR's persistently <60 mL/min signify possible Chronic Kidney Disease.    Anion gap 10 5 - 15  Lipase, blood     Status: None   Collection Time: 04/23/16  8:25 AM  Result Value Ref Range   Lipase 42 11 - 51 U/L  CBC with Differential     Status: Abnormal   Collection Time: 04/23/16  8:25 AM  Result Value Ref Range  WBC 8.8 3.6 - 11.0 K/uL   RBC 3.89 3.80 - 5.20 MIL/uL   Hemoglobin 11.5 (L) 12.0 - 16.0 g/dL   HCT 34.5 (L) 35.0 - 47.0 %   MCV 88.7 80.0 - 100.0 fL   MCH 29.5 26.0 - 34.0 pg   MCHC 33.3 32.0 - 36.0 g/dL   RDW 13.6 11.5 - 14.5 %   Platelets 233 150 - 440 K/uL   Neutrophils Relative % 60 %   Neutro Abs 5.3 1.4 - 6.5 K/uL   Lymphocytes Relative 28 %   Lymphs Abs 2.5 1.0 - 3.6 K/uL   Monocytes Relative 6 %   Monocytes Absolute 0.5 0.2 - 0.9 K/uL   Eosinophils Relative 5 %   Eosinophils Absolute 0.5 0 - 0.7 K/uL   Basophils Relative 1 %   Basophils Absolute 0.1 0 - 0.1 K/uL    Objective: General: Patient is awake, alert, and oriented x 3 and in no acute distress.  Integument: Skin is warm, dry and supple bilateral. Nails are tender, long, thickened and dystrophic with subungual debris, consistent with onychomycosis, 1-5 bilateral. No signs of infection. No open lesions or preulcerative lesions present bilateral.  Remaining integument unremarkable.  Vasculature:  Dorsalis Pedis pulse 2/4 bilateral. Posterior Tibial pulse  2/4 bilateral. Capillary fill time <3 sec 1-5 bilateral. Positive hair growth to the level of the digits. Temperature gradient within normal limits. No varicosities present bilateral. No edema present bilateral.   Neurology: The patient has intact sensation measured with a 5.07/10g Semmes Weinstein Monofilament at all pedal sites bilateral . Vibratory intact diminished bilateral with tuning fork. No Babinski sign present bilateral.   Musculoskeletal: Minimal pain at lateral ankle on left, no instability, No other symptomatic pedal deformities noted bilateral. Muscular strength 5/5 in all lower extremity muscular groups bilateral without pain on range of motion except inversion on left . No tenderness with calf compression bilateral.  Assessment and Plan: Problem List Items Addressed This Visit    None    Visit Diagnoses    Dermatophytosis of nail    -  Primary   Foot pain, unspecified laterality       Diabetes mellitus without complication (HCC)       Ankle sprain, left, initial encounter       Pain, ankle, left         -Examined patient. -Discussed and educated patient on diabetic foot care, especially with regards to the vascular, neurological and musculoskeletal systems.  -Stressed the importance of good glycemic control and the detriment of not controlling glucose levels in relation to the foot. -Mechanically debrided all nails 1-5 bilateral using sterile nail nipper and filed with dremel without incident  -Recommend patient to return to using ankle brace on left and to rest, ice, elevate, and take motrin until symptoms are resolved. If fails to improve will repeat xrays. -Answered all patient questions -Patient to return  in 3 months for at risk foot care -Patient advised to call the office if any problems or questions arise in the meantime.  Landis Martins, DPM

## 2016-07-05 NOTE — Patient Instructions (Addendum)
Diabetes and Foot Care Diabetes may cause you to have problems because of poor blood supply (circulation) to your feet and legs. This may cause the skin on your feet to become thinner, break easier, and heal more slowly. Your skin may become dry, and the skin may peel and crack. You may also have nerve damage in your legs and feet causing decreased feeling in them. You may not notice minor injuries to your feet that could lead to infections or more serious problems. Taking care of your feet is one of the most important things you can do for yourself.  HOME CARE INSTRUCTIONS  Wear shoes at all times, even in the house. Do not go barefoot. Bare feet are easily injured.  Check your feet daily for blisters, cuts, and redness. If you cannot see the bottom of your feet, use a mirror or ask someone for help.  Wash your feet with warm water (do not use hot water) and mild soap. Then pat your feet and the areas between your toes until they are completely dry. Do not soak your feet as this can dry your skin.  Apply a moisturizing lotion or petroleum jelly (that does not contain alcohol and is unscented) to the skin on your feet and to dry, brittle toenails. Do not apply lotion between your toes.  Trim your toenails straight across. Do not dig under them or around the cuticle. File the edges of your nails with an emery board or nail file.  Do not cut corns or calluses or try to remove them with medicine.  Wear clean socks or stockings every day. Make sure they are not too tight. Do not wear knee-high stockings since they may decrease blood flow to your legs.  Wear shoes that fit properly and have enough cushioning. To break in new shoes, wear them for just a few hours a day. This prevents you from injuring your feet. Always look in your shoes before you put them on to be sure there are no objects inside.  Do not cross your legs. This may decrease the blood flow to your feet.  If you find a minor scrape,  cut, or break in the skin on your feet, keep it and the skin around it clean and dry. These areas may be cleansed with mild soap and water. Do not cleanse the area with peroxide, alcohol, or iodine.  When you remove an adhesive bandage, be sure not to damage the skin around it.  If you have a wound, look at it several times a day to make sure it is healing.  Do not use heating pads or hot water bottles. They may burn your skin. If you have lost feeling in your feet or legs, you may not know it is happening until it is too late.  Make sure your health care provider performs a complete foot exam at least annually or more often if you have foot problems. Report any cuts, sores, or bruises to your health care provider immediately. SEEK MEDICAL CARE IF:   You have an injury that is not healing.  You have cuts or breaks in the skin.  You have an ingrown nail.  You notice redness on your legs or feet.  You feel burning or tingling in your legs or feet.  You have pain or cramps in your legs and feet.  Your legs or feet are numb.  Your feet always feel cold. SEEK IMMEDIATE MEDICAL CARE IF:   There is increasing redness,   swelling, or pain in or around a wound.  There is a red line that goes up your leg.  Pus is coming from a wound.  You develop a fever or as directed by your health care provider.  You notice a bad smell coming from an ulcer or wound.   This information is not intended to replace advice given to you by your health care provider. Make sure you discuss any questions you have with your health care provider.   Document Released: 11/01/2000 Document Revised: 07/07/2013 Document Reviewed: 04/13/2013 Elsevier Interactive Patient Education 2016 Elsevier Inc.   Ankle Sprain An ankle sprain is an injury to the strong, fibrous tissues (ligaments) that hold the bones of your ankle joint together.  CAUSES An ankle sprain is usually caused by a fall or by twisting your ankle.  Ankle sprains most commonly occur when you step on the outer edge of your foot, and your ankle turns inward. People who participate in sports are more prone to these types of injuries.  SYMPTOMS   Pain in your ankle. The pain may be present at rest or only when you are trying to stand or walk.  Swelling.  Bruising. Bruising may develop immediately or within 1 to 2 days after your injury.  Difficulty standing or walking, particularly when turning corners or changing directions. DIAGNOSIS  Your caregiver will ask you details about your injury and perform a physical exam of your ankle to determine if you have an ankle sprain. During the physical exam, your caregiver will press on and apply pressure to specific areas of your foot and ankle. Your caregiver will try to move your ankle in certain ways. An X-ray exam may be done to be sure a bone was not broken or a ligament did not separate from one of the bones in your ankle (avulsion fracture).  TREATMENT  Certain types of braces can help stabilize your ankle. Your caregiver can make a recommendation for this. Your caregiver may recommend the use of medicine for pain. If your sprain is severe, your caregiver may refer you to a surgeon who helps to restore function to parts of your skeletal system (orthopedist) or a physical therapist. HOME CARE INSTRUCTIONS   Apply ice to your injury for 1-2 days or as directed by your caregiver. Applying ice helps to reduce inflammation and pain.  Put ice in a plastic bag.  Place a towel between your skin and the bag.  Leave the ice on for 15-20 minutes at a time, every 2 hours while you are awake.  Only take over-the-counter or prescription medicines for pain, discomfort, or fever as directed by your caregiver.  Elevate your injured ankle above the level of your heart as much as possible for 2-3 days.  If your caregiver recommends crutches, use them as instructed. Gradually put weight on the affected ankle.  Continue to use crutches or a cane until you can walk without feeling pain in your ankle.  If you have a plaster splint, wear the splint as directed by your caregiver. Do not rest it on anything harder than a pillow for the first 24 hours. Do not put weight on it. Do not get it wet. You may take it off to take a shower or bath.  You may have been given an elastic bandage to wear around your ankle to provide support. If the elastic bandage is too tight (you have numbness or tingling in your foot or your foot becomes cold and blue), adjust  the bandage to make it comfortable.  If you have an air splint, you may blow more air into it or let air out to make it more comfortable. You may take your splint off at night and before taking a shower or bath. Wiggle your toes in the splint several times per day to decrease swelling. SEEK MEDICAL CARE IF:   You have rapidly increasing bruising or swelling.  Your toes feel extremely cold or you lose feeling in your foot.  Your pain is not relieved with medicine. SEEK IMMEDIATE MEDICAL CARE IF:  Your toes are numb or blue.  You have severe pain that is increasing. MAKE SURE YOU:   Understand these instructions.  Will watch your condition.  Will get help right away if you are not doing well or get worse.   This information is not intended to replace advice given to you by your health care provider. Make sure you discuss any questions you have with your health care provider.   Document Released: 11/04/2005 Document Revised: 11/25/2014 Document Reviewed: 11/16/2011 Elsevier Interactive Patient Education [employer] Elsevier Inc.

## 2016-09-27 ENCOUNTER — Ambulatory Visit (INDEPENDENT_AMBULATORY_CARE_PROVIDER_SITE_OTHER): Payer: Medicare Other | Admitting: Podiatry

## 2016-09-27 VITALS — BP 107/66 | HR 78 | Resp 16

## 2016-09-27 DIAGNOSIS — M79609 Pain in unspecified limb: Secondary | ICD-10-CM | POA: Diagnosis not present

## 2016-09-27 DIAGNOSIS — B351 Tinea unguium: Secondary | ICD-10-CM | POA: Diagnosis not present

## 2016-09-27 DIAGNOSIS — E0843 Diabetes mellitus due to underlying condition with diabetic autonomic (poly)neuropathy: Secondary | ICD-10-CM

## 2016-09-27 DIAGNOSIS — L608 Other nail disorders: Secondary | ICD-10-CM

## 2016-09-27 DIAGNOSIS — L603 Nail dystrophy: Secondary | ICD-10-CM

## 2016-10-01 NOTE — Progress Notes (Signed)
SUBJECTIVE Patient with a history of diabetes mellitus presents to office today complaining of elongated, thickened nails. Pain while ambulating in shoes. Patient is unable to trim their own nails.   Allergies  Allergen Reactions  . Latex Hives  . Lidocaine Hives    OBJECTIVE General Patient is awake, alert, and oriented x 3 and in no acute distress. Derm Skin is dry and supple bilateral. Negative open lesions or macerations. Remaining integument unremarkable. Nails are tender, long, thickened and dystrophic with subungual debris, consistent with onychomycosis, 1-5 bilateral. No signs of infection noted. Vasc  DP and PT pedal pulses palpable bilaterally. Temperature gradient within normal limits.  Neuro Epicritic and protective threshold sensation diminished bilaterally.  Musculoskeletal Exam No symptomatic pedal deformities noted bilateral. Muscular strength within normal limits.  ASSESSMENT 1. Diabetes Mellitus w/ peripheral neuropathy 2. Onychomycosis of nail due to dermatophyte bilateral 3. Pain in foot bilateral  PLAN OF CARE 1. Patient evaluated today. 2. Instructed to maintain good pedal hygiene and foot care. Stressed importance of controlling blood sugar.  3. Mechanical debridement of nails 1-5 bilaterally performed using a nail nipper. Filed with dremel without incident.  4. Return to clinic in 3 mos.    Felecia ShellingBrent M Evans, DPM

## 2016-10-21 ENCOUNTER — Other Ambulatory Visit: Payer: Self-pay | Admitting: Internal Medicine

## 2016-10-21 DIAGNOSIS — Z1231 Encounter for screening mammogram for malignant neoplasm of breast: Secondary | ICD-10-CM

## 2016-10-29 ENCOUNTER — Ambulatory Visit: Payer: Medicare Other | Admitting: Cardiovascular Disease

## 2016-11-15 ENCOUNTER — Ambulatory Visit
Admission: RE | Admit: 2016-11-15 | Discharge: 2016-11-15 | Disposition: A | Discharge status: Home / Self Care | Payer: Medicare Other | Source: Ambulatory Visit | Attending: Internal Medicine | Admitting: Internal Medicine

## 2016-11-15 DIAGNOSIS — Z1231 Encounter for screening mammogram for malignant neoplasm of breast: Secondary | ICD-10-CM | POA: Diagnosis not present

## 2016-11-21 ENCOUNTER — Ambulatory Visit (INDEPENDENT_AMBULATORY_CARE_PROVIDER_SITE_OTHER): Payer: Medicare Other | Admitting: Cardiovascular Disease

## 2016-11-21 ENCOUNTER — Encounter: Payer: Self-pay | Admitting: Cardiovascular Disease

## 2016-11-21 VITALS — BP 144/80 | HR 74 | Ht 66.0 in | Wt 214.8 lb

## 2016-11-21 DIAGNOSIS — M542 Cervicalgia: Secondary | ICD-10-CM | POA: Insufficient documentation

## 2016-11-21 DIAGNOSIS — I10 Essential (primary) hypertension: Secondary | ICD-10-CM

## 2016-11-21 DIAGNOSIS — R079 Chest pain, unspecified: Secondary | ICD-10-CM | POA: Diagnosis not present

## 2016-11-21 DIAGNOSIS — E782 Mixed hyperlipidemia: Secondary | ICD-10-CM

## 2016-11-21 DIAGNOSIS — E119 Type 2 diabetes mellitus without complications: Secondary | ICD-10-CM

## 2016-11-21 NOTE — Patient Instructions (Signed)

## 2016-11-21 NOTE — Progress Notes (Signed)
Cardiology Office Note  Date:  11/21/2016   ID:  Destiny Savage, DOB [birth year], MRN [id number]  PCP:  Jaclyn Shaggy, MD   Chief Complaint  Patient presents with  . other    10 month fu. Pt states she is doing well besides loosing her mother 11/5. Reviewed meds with pt verbally.    HPI:  Destiny Savage is a 66 y.o. female with history of TIA/stroke, DM2, HTN, HLD, and long history of nausea/vomiting/diarrhea without etiology with previous symptoms of chest discomfort, who presents for routine follow-up of her chest pain symptoms Last stress test February 2017 showing no ischemia  In follow-up she reports that she feels well  recently lost her mother November 2017, still very sad Seeing a counselor, at times feels confused, forgets things such as where she was driving  Denies any significant chest pain on exertion No significant shortness of breath, denies any leg swelling She reports new neck pain on the left starting this morning, pain on turning her head to the left  Reports blood pressure has been well-controlled Tolerating simvastatin No recent lab work available  EKG on today's visit shows normal sinus rhythm with rate 74 bpm, no significant ST or T-wave changes  Other past medical history reviewed Admission to [facility] 12/24/2015 with chest pain Negative cardiac enzymes, EKG relatively unchanged  Echo showed EF 60-65%, no RWMA, GR1DD, PASP 47 mm Hg, and mild to moderate posterior pericardial effusion without evidence of hemodynamic compromise.  She remained chest pain free throughout her admission.   stress test 12/2015  no ischemia normal ejection fraction She denies any further episodes of chest pain  History of severe GI issues, chronic diarrhea Previously seen by GI at [facility]  admitted to [facility] in 11/2001 for chest pain Dr. Juliann Pares performed a cardiac cath that did not show any significant coroanry disease.  She was again admitted to [facility] in 2004 for chest pain and  had a nuclear stress test that did not show any ischemia.   normal echo.  She has had off-and-on admissions over the years for abdominal pain and C diff infections  PMH:   has a past medical history of Arthritis; Asthma; Diabetes mellitus; GERD (gastroesophageal reflux disease); Hyperlipidemia; Hypertension; Mini stroke (HCC); and Stroke (HCC).  PSH:    Past Surgical History:  Procedure Laterality Date  . ABDOMINAL HYSTERECTOMY    . CARDIAC CATHETERIZATION    . CHOLECYSTECTOMY      Current Outpatient Prescriptions  Medication Sig Dispense Refill  . carvedilol (COREG) 6.25 MG tablet Take 6.25 mg by mouth 2 (two) times daily with a meal.    . clopidogrel (PLAVIX) 75 MG tablet Take 75 mg by mouth daily.    Marland Kitchen docusate sodium (COLACE) 100 MG capsule Take 1 capsule (100 mg total) by mouth daily as needed. 30 capsule 2  . furosemide (LASIX) 20 MG tablet TAKE ONE TABLET BY MOUTH ONCE DAILY 30 tablet 6  . omeprazole (PRILOSEC) 20 MG capsule Take 20 mg by mouth daily.    . simvastatin (ZOCOR) 40 MG tablet Take 40 mg by mouth every evening.    . sulfacetamide (BLEPH-10) 10 % ophthalmic solution Place 1 drop into both eyes every other day.     . potassium chloride (K-DUR) 10 MEQ tablet Take 1 tablet (10 mEq total) by mouth daily. 90 tablet 3   No current facility-administered medications for this visit.      Allergies:   Latex and Lidocaine   Social History:  The patient  reports that she has never smoked. She has never used smokeless tobacco. She reports that she does not drink alcohol or use drugs.   Family History:   family history includes Heart Problems in her father.    Review of Systems: Review of Systems  Constitutional: Negative.   Respiratory: Negative.   Cardiovascular: Negative.   Gastrointestinal: Negative.   Musculoskeletal: Negative.   Neurological: Negative.   Psychiatric/Behavioral: Positive for depression.  All other systems reviewed and are  negative.    PHYSICAL EXAM: VS:  BP (!) 144/80 (BP Location: Left Arm, Patient Position: Sitting, Cuff Size: Normal)   Pulse 74   Ht 5\' 6"  (1.676 m)   Wt 214 lb 12 oz (97.4 kg)   BMI 34.66 kg/m  , BMI Body mass index is 34.66 kg/m. GEN: Well nourished, well developed, in no acute distress , Obese HEENT: normal  Neck: no JVD, carotid bruits, or masses Cardiac: RRR; no murmurs, rubs, or gallops,no edema  Respiratory:  clear to auscultation bilaterally, normal work of breathing GI: soft, nontender, nondistended, + BS MS: no deformity or atrophy , Difficulty turning her head to the left secondary to left neck pain Skin: warm and dry, no rash Neuro:  Strength and sensation are intact Psych: euthymic mood, full affect    Recent Labs: 04/23/2016: ALT 10; BUN 15; Creatinine, Ser 0.81; Hemoglobin 11.5; Platelets 233; Potassium 4.0; Sodium 139    Lipid Panel Lab Results  Component Value Date   CHOL 191 07/10/2014   HDL 88 (H) 07/10/2014   LDLCALC 82 07/10/2014   TRIG 105 07/10/2014      Wt Readings from Last 3 Encounters:  11/21/16 214 lb 12 oz (97.4 kg)  04/23/16 213 lb (96.6 kg)  02/02/16 219 lb 8 oz (99.6 kg)       ASSESSMENT AND PLAN:  Essential hypertension - Plan: EKG 12-Lead Blood pressure is well controlled on today's visit. No changes made to the medications.  Controlled type 2 diabetes mellitus without complication, without long-term current use of insulin (HCC) We have encouraged continued exercise, careful diet management in an effort to lose weight.  Chest pain, unspecified type She denies any significant chest pain. No further workup at this time  Mixed hyperlipidemia Recommended she continue simvastatin We have requested previous lipid panel from Dr. Arlana Pouchate  Neck pain Recommended heat, light stretching, NSAIDs, massage  Adjustment disorder Recent loss of her mother, details discussed with her  overall feels lost, feels she has memory  problems Overall feels well but is seeing a counselor   Total encounter time more than 25 minutes  Greater than 50% was spent in counseling and coordination of care with the patient   Disposition:   F/U  6 months   Orders Placed This Encounter  Procedures  . EKG 12-Lead     Signed, Dossie Arbourim Raisha Brabender, M.D., Ph.D. 11/21/2016  [facility], [place] [phone]

## 2016-11-26 ENCOUNTER — Ambulatory Visit: Payer: Medicare Other

## 2016-12-05 ENCOUNTER — Other Ambulatory Visit: Payer: Self-pay | Admitting: Physician Assistant

## 2016-12-30 ENCOUNTER — Ambulatory Visit: Payer: Self-pay | Admitting: Podiatry

## 2016-12-31 ENCOUNTER — Ambulatory Visit: Payer: Medicare Other | Admitting: Podiatry

## 2017-01-10 ENCOUNTER — Emergency Department: Payer: Medicare (Managed Care)

## 2017-01-10 ENCOUNTER — Telehealth: Payer: Self-pay | Admitting: Cardiovascular Disease

## 2017-01-10 ENCOUNTER — Observation Stay
Admission: EM | Admit: 2017-01-10 | Discharge: 2017-01-11 | Disposition: A | Discharge status: Home / Self Care | Payer: Medicare (Managed Care) | Attending: Internal Medicine | Admitting: Internal Medicine

## 2017-01-10 DIAGNOSIS — R29898 Other symptoms and signs involving the musculoskeletal system: Secondary | ICD-10-CM | POA: Diagnosis present

## 2017-01-10 DIAGNOSIS — E1151 Type 2 diabetes mellitus with diabetic peripheral angiopathy without gangrene: Secondary | ICD-10-CM | POA: Insufficient documentation

## 2017-01-10 DIAGNOSIS — R202 Paresthesia of skin: Secondary | ICD-10-CM | POA: Insufficient documentation

## 2017-01-10 DIAGNOSIS — Z886 Allergy status to analgesic agent status: Secondary | ICD-10-CM | POA: Insufficient documentation

## 2017-01-10 DIAGNOSIS — I6523 Occlusion and stenosis of bilateral carotid arteries: Secondary | ICD-10-CM | POA: Insufficient documentation

## 2017-01-10 DIAGNOSIS — Z8249 Family history of ischemic heart disease and other diseases of the circulatory system: Secondary | ICD-10-CM | POA: Insufficient documentation

## 2017-01-10 DIAGNOSIS — I071 Rheumatic tricuspid insufficiency: Secondary | ICD-10-CM | POA: Insufficient documentation

## 2017-01-10 DIAGNOSIS — E1141 Type 2 diabetes mellitus with diabetic mononeuropathy: Secondary | ICD-10-CM | POA: Diagnosis present

## 2017-01-10 DIAGNOSIS — G589 Mononeuropathy, unspecified: Secondary | ICD-10-CM | POA: Insufficient documentation

## 2017-01-10 DIAGNOSIS — E782 Mixed hyperlipidemia: Secondary | ICD-10-CM | POA: Diagnosis not present

## 2017-01-10 DIAGNOSIS — M5136 Other intervertebral disc degeneration, lumbar region: Secondary | ICD-10-CM | POA: Diagnosis not present

## 2017-01-10 DIAGNOSIS — Z7902 Long term (current) use of antithrombotics/antiplatelets: Secondary | ICD-10-CM | POA: Insufficient documentation

## 2017-01-10 DIAGNOSIS — M1990 Unspecified osteoarthritis, unspecified site: Secondary | ICD-10-CM | POA: Insufficient documentation

## 2017-01-10 DIAGNOSIS — K219 Gastro-esophageal reflux disease without esophagitis: Secondary | ICD-10-CM | POA: Diagnosis not present

## 2017-01-10 DIAGNOSIS — J45909 Unspecified asthma, uncomplicated: Secondary | ICD-10-CM | POA: Diagnosis not present

## 2017-01-10 DIAGNOSIS — R51 Headache: Secondary | ICD-10-CM | POA: Insufficient documentation

## 2017-01-10 DIAGNOSIS — R200 Anesthesia of skin: Principal | ICD-10-CM | POA: Insufficient documentation

## 2017-01-10 DIAGNOSIS — I10 Essential (primary) hypertension: Secondary | ICD-10-CM | POA: Diagnosis not present

## 2017-01-10 DIAGNOSIS — Z91040 Latex allergy status: Secondary | ICD-10-CM | POA: Insufficient documentation

## 2017-01-10 DIAGNOSIS — G587 Mononeuritis multiplex: Secondary | ICD-10-CM

## 2017-01-10 DIAGNOSIS — G459 Transient cerebral ischemic attack, unspecified: Secondary | ICD-10-CM

## 2017-01-10 DIAGNOSIS — I639 Cerebral infarction, unspecified: Secondary | ICD-10-CM

## 2017-01-10 DIAGNOSIS — Z8673 Personal history of transient ischemic attack (TIA), and cerebral infarction without residual deficits: Secondary | ICD-10-CM | POA: Diagnosis not present

## 2017-01-10 DIAGNOSIS — R519 Headache, unspecified: Secondary | ICD-10-CM

## 2017-01-10 DIAGNOSIS — E119 Type 2 diabetes mellitus without complications: Secondary | ICD-10-CM

## 2017-01-10 LAB — COMPREHENSIVE METABOLIC PANEL
ALBUMIN: 4.4 g/dL (ref 3.5–5.0)
ALK PHOS: 69 U/L (ref 38–126)
ALT: 11 U/L — AB (ref 14–54)
AST: 26 U/L (ref 15–41)
Anion gap: 9 (ref 5–15)
BILIRUBIN TOTAL: 0.3 mg/dL (ref 0.3–1.2)
BUN: 12 mg/dL (ref 6–20)
CO2: 26 mmol/L (ref 22–32)
CREATININE: 0.77 mg/dL (ref 0.44–1.00)
Calcium: 9.9 mg/dL (ref 8.9–10.3)
Chloride: 103 mmol/L (ref 101–111)
GFR calc Af Amer: >60 mL/min (ref >60)
GLUCOSE: 117 mg/dL — AB (ref 65–99)
POTASSIUM: 3.7 mmol/L (ref 3.5–5.1)
Sodium: 138 mmol/L (ref 135–145)
TOTAL PROTEIN: 7.6 g/dL (ref 6.5–8.1)

## 2017-01-10 LAB — PROTIME-INR
INR: 0.95
Prothrombin Time: 12.7 seconds (ref 11.4–15.2)

## 2017-01-10 LAB — DIFFERENTIAL
BASOS ABS: 0 10*3/uL (ref 0–0.1)
Basophils Relative: 1 %
EOS ABS: 0.5 10*3/uL (ref 0–0.7)
Eosinophils Relative: 6 %
LYMPHS ABS: 2.7 10*3/uL (ref 1.0–3.6)
LYMPHS PCT: 30 %
MONOS PCT: 6 %
Monocytes Absolute: 0.6 10*3/uL (ref 0.2–0.9)
NEUTROS ABS: 5.4 10*3/uL (ref 1.4–6.5)
NEUTROS PCT: 57 %

## 2017-01-10 LAB — CBC
HEMATOCRIT: 36.1 % (ref 35.0–47.0)
HEMOGLOBIN: 12.4 g/dL (ref 12.0–16.0)
MCH: 30.2 pg (ref 26.0–34.0)
MCHC: 34.4 g/dL (ref 32.0–36.0)
MCV: 88 fL (ref 80.0–100.0)
Platelets: 250 10*3/uL (ref 150–440)
RBC: 4.11 MIL/uL (ref 3.80–5.20)
RDW: 13.6 % (ref 11.5–14.5)
WBC: 9.3 10*3/uL (ref 3.6–11.0)

## 2017-01-10 LAB — APTT: APTT: 33 s (ref 24–36)

## 2017-01-10 LAB — GLUCOSE, CAPILLARY: Glucose-Capillary: 100 mg/dL — ABNORMAL HIGH (ref 65–99)

## 2017-01-10 MED ORDER — SENNOSIDES-DOCUSATE SODIUM 8.6-50 MG PO TABS
1.0000 | ORAL_TABLET | Freq: Every evening | ORAL | Status: DC | PRN
Start: 2017-01-10 — End: 2017-01-11

## 2017-01-10 MED ORDER — INSULIN ASPART 100 UNIT/ML ~~LOC~~ SOLN
0.0000 [IU] | Freq: Three times a day (TID) | SUBCUTANEOUS | Status: DC
  Administered 2017-01-11: 12:00:00 2 [IU] via SUBCUTANEOUS
  Administered 2017-01-11: 1 [IU] via SUBCUTANEOUS
  Filled 2017-01-10: qty 2
  Filled 2017-01-10: qty 1

## 2017-01-10 MED ORDER — LORAZEPAM 1 MG PO TABS
1.0000 mg | ORAL_TABLET | Freq: Once | ORAL | Status: AC
  Administered 2017-01-10: 1 mg via ORAL
  Filled 2017-01-10: qty 1

## 2017-01-10 MED ORDER — ENOXAPARIN SODIUM 40 MG/0.4ML ~~LOC~~ SOLN: 40.0000 mg | SUBCUTANEOUS | Status: DC

## 2017-01-10 MED ORDER — PROCHLORPERAZINE EDISYLATE 5 MG/ML IJ SOLN
10.0000 mg | Freq: Once | INTRAMUSCULAR | Status: AC
  Administered 2017-01-10: 10 mg via INTRAVENOUS
  Filled 2017-01-10: qty 2

## 2017-01-10 MED ORDER — LORAZEPAM 2 MG/ML IJ SOLN
INTRAMUSCULAR | Status: AC
  Filled 2017-01-10: qty 1

## 2017-01-10 MED ORDER — STROKE: EARLY STAGES OF RECOVERY BOOK: Freq: Once | Status: DC

## 2017-01-10 MED ORDER — ACETAMINOPHEN 650 MG RE SUPP: 650.0000 mg | RECTAL | Status: DC | PRN

## 2017-01-10 MED ORDER — SODIUM CHLORIDE 0.9 % IV BOLUS (SEPSIS)
500.0000 mL | Freq: Once | INTRAVENOUS | Status: AC
  Administered 2017-01-10: 500 mL via INTRAVENOUS

## 2017-01-10 MED ORDER — PANTOPRAZOLE SODIUM 40 MG PO TBEC
40.0000 mg | DELAYED_RELEASE_TABLET | Freq: Every day | ORAL | Status: DC
  Administered 2017-01-11: 40 mg via ORAL
  Filled 2017-01-10: qty 1

## 2017-01-10 MED ORDER — CLOPIDOGREL BISULFATE 75 MG PO TABS
75.0000 mg | ORAL_TABLET | Freq: Every day | ORAL | Status: DC
Start: 2017-01-11 — End: 2017-01-11
  Administered 2017-01-11: 09:00:00 75 mg via ORAL
  Filled 2017-01-10: qty 1

## 2017-01-10 MED ORDER — LORAZEPAM 2 MG/ML IJ SOLN
1.0000 mg | Freq: Once | INTRAMUSCULAR | Status: AC
  Administered 2017-01-10: 1 mg via INTRAVENOUS

## 2017-01-10 MED ORDER — INSULIN ASPART 100 UNIT/ML ~~LOC~~ SOLN: 0.0000 [IU] | Freq: Every day | SUBCUTANEOUS | Status: DC

## 2017-01-10 MED ORDER — ACETAMINOPHEN 160 MG/5ML PO SOLN: 650.0000 mg | ORAL | Status: DC | PRN

## 2017-01-10 MED ORDER — DIPHENHYDRAMINE HCL 50 MG/ML IJ SOLN
25.0000 mg | Freq: Once | INTRAMUSCULAR | Status: AC
  Administered 2017-01-10: 25 mg via INTRAVENOUS
  Filled 2017-01-10: qty 1

## 2017-01-10 MED ORDER — PROCHLORPERAZINE EDISYLATE 5 MG/ML IJ SOLN: 5.0000 mg | INTRAMUSCULAR | Status: DC | PRN

## 2017-01-10 MED ORDER — SIMVASTATIN 40 MG PO TABS
40.0000 mg | ORAL_TABLET | Freq: Every evening | ORAL | Status: DC
  Administered 2017-01-10 – 2017-01-11 (×2): 40 mg via ORAL
  Filled 2017-01-10 (×2): qty 1

## 2017-01-10 MED ORDER — ACETAMINOPHEN 325 MG PO TABS
650.0000 mg | ORAL_TABLET | ORAL | Status: DC | PRN
Start: 2017-01-10 — End: 2017-01-11
  Administered 2017-01-11: 650 mg via ORAL
  Filled 2017-01-10: qty 2

## 2017-01-10 NOTE — ED Notes (Signed)
Pt to MRI

## 2017-01-10 NOTE — ED Notes (Signed)
Report to Rande LawmanErwin, RN-1C. Encouraged to call with questions,

## 2017-01-10 NOTE — ED Provider Notes (Signed)
[facility] Emergency Department Provider Note    None    (approximate)  I have reviewed the triage vital signs and the nursing notes.   HISTORY  Chief Complaint Headache and Extremity Weakness    HPI Destiny Savage is a 66 y.o. female with a history of TIA, hypertension and diabetes presents with 3 days of intermittent headaches associated with nausea but no vomiting. States that she is endorsing photophobia. States the headache has been worse in the mornings, gradually eases. States that she's also been feeling a tingling sensation in her left leg that is new. Denies any neck pain. She is able to ambulate with a steady gait. Does not use any assistive devices to walk. She is not on any anticoagulation. States that she does take a daily Plavix.   Past Medical History:  Diagnosis Date  . Arthritis   . Asthma   . Diabetes mellitus   . GERD (gastroesophageal reflux disease)   . Hyperlipidemia   . Hypertension   . Mini stroke (HCC)   . Stroke [facility])    Family History  Problem Relation Age of Onset  . Heart Problems Father   . Hypertension    . CAD     Past Surgical History:  Procedure Laterality Date  . ABDOMINAL HYSTERECTOMY    . CARDIAC CATHETERIZATION    . CHOLECYSTECTOMY     Patient Active Problem List   Diagnosis Date Noted  . Mixed hyperlipidemia 11/21/2016  . Neck pain 11/21/2016  . Chest pain 12/23/2015  . GERD (gastroesophageal reflux disease) 12/23/2015  . Abdominal pain 04/30/2013  . Vertigo 05/11/2012  . Hypertension 05/11/2012  . Diabetes type 2, controlled (HCC) 05/11/2012  . Neck pain on right side 05/11/2012      Prior to Admission medications   Medication Sig Start Date End Date Taking? Authorizing Provider  carvedilol (COREG) 6.25 MG tablet Take 6.25 mg by mouth 2 (two) times daily with a meal.    Historical Provider, MD  clopidogrel (PLAVIX) 75 MG tablet Take 75 mg by mouth daily.    Historical Provider, MD    docusate sodium (COLACE) 100 MG capsule Take 1 capsule (100 mg total) by mouth daily as needed. 04/23/16 04/23/17  Jeanmarie Plant, MD  furosemide (LASIX) 20 MG tablet TAKE ONE TABLET BY MOUTH ONCE DAILY 12/06/16   Sondra Barges, PA-C  omeprazole (PRILOSEC) 20 MG capsule Take 20 mg by mouth daily.    Historical Provider, MD  potassium chloride (K-DUR) 10 MEQ tablet Take 1 tablet (10 mEq total) by mouth daily. 11/21/16   Antonieta Iba, MD  simvastatin (ZOCOR) 40 MG tablet Take 40 mg by mouth every evening.    Historical Provider, MD  sulfacetamide (BLEPH-10) 10 % ophthalmic solution Place 1 drop into both eyes every other day.     Historical Provider, MD    Allergies Latex and Lidocaine    Social History Social History  Substance Use Topics  . Smoking status: Never Smoker  . Smokeless tobacco: Never Used  . Alcohol use No    Review of Systems Patient denies headaches, rhinorrhea, blurry vision, numbness, shortness of breath, chest pain, edema, cough, abdominal pain, nausea, vomiting, diarrhea, dysuria, fevers, rashes or hallucinations unless otherwise stated above in HPI. ____________________________________________   PHYSICAL EXAM:  VITAL SIGNS: Vitals:   01/10/17 1553  Pulse: 71  Resp: 16  Temp: 99 F (37.2 C)    Constitutional: Alert and oriented. Well appearing and in  no acute distress. Eyes: Conjunctivae are normal. PERRL. EOMI. Head: Atraumatic. Nose: No congestion/rhinnorhea. Mouth/Throat: Mucous membranes are moist.  Oropharynx non-erythematous. Neck: No stridor. Painless ROM. No cervical spine tenderness to palpation Hematological/Lymphatic/Immunilogical: No cervical lymphadenopathy. Cardiovascular: Normal rate, regular rhythm. Grossly normal heart sounds.  Good peripheral circulation. Respiratory: Normal respiratory effort.  No retractions. Lungs CTAB. Gastrointestinal: Soft and nontender. No distention. No abdominal bruits. No CVA tenderness. Musculoskeletal: No  lower extremity tenderness nor edema.  No joint effusions. Neurologic:  CN- intact.  No facial droop, Normal FNF.  Normal heel to shin on right but slow on the left.  4+/5 strength with leg raise on left,  5/5 on RLE. Sensation intact bilaterally. Normal speech and language. No gross focal neurologic deficits are appreciated. No gait instability. Skin:  Skin is warm, dry and intact. No rash noted. Psychiatric: Mood and affect are normal. Speech and behavior are normal.  ____________________________________________   LABS (all labs ordered are listed, but only abnormal results are displayed)  No results found for this or any previous visit (from the past 24 hour(s)). ____________________________________________  EKG My review and personal interpretation at Time: 16:04   Indication: weakness  Rate: 70  Rhythm: sinus Axis: normal Other: non specific st changes ____________________________________________  RADIOLOGY  I personally reviewed all radiographic images ordered to evaluate for the above acute complaints and reviewed radiology reports and findings.  These findings were personally discussed with the patient.  Please see medical record for radiology report.  ____________________________________________   PROCEDURES  Procedure(s) performed:  Procedures    Critical Care performed: no ____________________________________________   INITIAL IMPRESSION / ASSESSMENT AND PLAN / ED COURSE  Pertinent labs & imaging results that were available during my care of the patient were reviewed by me and considered in my medical decision making (see chart for details).  DDX: cva, tia, migraine, tension headache hypoglycemia, dehydration, electrolyte abnormality,  sepsis   Destiny Savage is a 66 y.o. who presents to the ED with headache and weakness as described above. Patient does have risk factors for CVA. CT head ordered to evaluate for any evidence of bleed shows no acute abnormality.  Based on her headache and persistent deficit will order MRI to further evaluate for any evidence of ischemia.  The patient will be placed on continuous pulse oximetry and telemetry for monitoring.  Laboratory evaluation will be sent to evaluate for the above complaints.     Clinical Course as of Jan 11 17  Fri Jan 10, 2017  2010 MRI shows tiny area of possible early ischemia. Does not completely correspond with the patient's deficit the patient is high risk and is still complaining of weakness. Based on this to feel patient would benefit from further evaluation and ancillary services. Have discussed with Dr. Verdis FredericksonHugo Meyer who kindly agrees to admit patient for further evaluation and management.  [PR]    Clinical Course User Index [PR] Willy EddyPatrick Neeko Pharo, MD     ____________________________________________   FINAL CLINICAL IMPRESSION(S) / ED DIAGNOSES  Final diagnoses:  Weakness of left lower extremity  Transient cerebral ischemia, unspecified type  Acute nonintractable headache, unspecified headache type      NEW MEDICATIONS STARTED DURING THIS VISIT:  New Prescriptions   No medications on file     Note:  This document was prepared using Dragon voice recognition software and may include unintentional dictation errors.    Willy EddyPatrick Reighn Kaplan, MD 01/11/17 [phone]

## 2017-01-10 NOTE — ED Triage Notes (Signed)
Pt reports left leg numbness since 11AM this morning when she awoke and headache. Pt reports "not feeling like myself". Pt alert and oriented X4, active, cooperative, pt in NAD. RR even and unlabored, color WNL.

## 2017-01-10 NOTE — ED Notes (Signed)
Pt ambulatory to toilet in room with no difficulty or distress. Fluids stopped at this time.

## 2017-01-10 NOTE — Telephone Encounter (Signed)
Pt calling stating for the last week she's has been feeling right Having some pains on her left leg  Left foot feels a bit numb Has some bad headaches along with that and it effects her BP  Is just worried.  She gets some sharp pain left side of her face Today she's a bit okay but it comes and goes. Would like to know what to do  She has called her pcp and they told her to call us  Please advise.

## 2017-01-10 NOTE — H&P (Signed)
[facility] [place] [facility]   PATIENT NAME: Destiny FranklinHattie Savage    MR#:  [id number]  DATE OF BIRTH:  08-22-1951  DATE OF ADMISSION:  01/10/2017  PRIMARY CARE PHYSICIAN: Jaclyn ShaggyATE,DENNY C, MD   REQUESTING/REFERRING PHYSICIAN: Roxan Hockeyobinson, MD  CHIEF COMPLAINT:   Chief Complaint  Patient presents with  . Headache  . Extremity Weakness    HISTORY OF PRESENT ILLNESS:  Destiny FranklinHattie Savage  is a 66 y.o. female who presents with Headache and left lower extremity numbness. Patient states that she's been having headache intermittently for the past couple of days. Today she developed left lower extremity numbness and decided to come in and be evaluated. Initially she called her primary care physician's office who referred her to cardiology within recommended she come in for evaluation. MRI in the ED here showed small acute to subacute stroke. Her symptoms have improved significantly here in the ED. Hospitalists were called for admission.  PAST MEDICAL HISTORY:   Past Medical History:  Diagnosis Date  . Arthritis   . Asthma   . Diabetes mellitus   . GERD (gastroesophageal reflux disease)   . Hyperlipidemia   . Hypertension   . Mini stroke (HCC)   . Stroke [facility](HCC)     PAST SURGICAL HISTORY:   Past Surgical History:  Procedure Laterality Date  . ABDOMINAL HYSTERECTOMY    . CARDIAC CATHETERIZATION    . CHOLECYSTECTOMY      SOCIAL HISTORY:   Social History  Substance Use Topics  . Smoking status: Never Smoker  . Smokeless tobacco: Never Used  . Alcohol use No    FAMILY HISTORY:   Family History  Problem Relation Age of Onset  . Heart Problems Father   . Hypertension    . CAD      DRUG ALLERGIES:   Allergies  Allergen Reactions  . Latex Hives  . Lidocaine Hives    MEDICATIONS AT HOME:   Prior to Admission medications   Medication Sig Start Date End Date Taking? Authorizing Provider  carvedilol (COREG) 6.25 MG tablet Take 6.25 mg by mouth 2 (two)  times daily with a meal.    Historical Provider, MD  clopidogrel (PLAVIX) 75 MG tablet Take 75 mg by mouth daily.    Historical Provider, MD  docusate sodium (COLACE) 100 MG capsule Take 1 capsule (100 mg total) by mouth daily as needed. 04/23/16 04/23/17  Jeanmarie PlantJames A McShane, MD  furosemide (LASIX) 20 MG tablet TAKE ONE TABLET BY MOUTH ONCE DAILY 12/06/16   Sondra Bargesyan M Dunn, PA-C  omeprazole (PRILOSEC) 20 MG capsule Take 20 mg by mouth daily.    Historical Provider, MD  potassium chloride (K-DUR) 10 MEQ tablet Take 1 tablet (10 mEq total) by mouth daily. 11/21/16   Antonieta Ibaimothy J Gollan, MD  simvastatin (ZOCOR) 40 MG tablet Take 40 mg by mouth every evening.    Historical Provider, MD  sulfacetamide (BLEPH-10) 10 % ophthalmic solution Place 1 drop into both eyes every other day.     Historical Provider, MD    REVIEW OF SYSTEMS:  Review of Systems  Constitutional: Negative for chills, fever, malaise/fatigue and weight loss.  HENT: Negative for ear pain, hearing loss and tinnitus.   Eyes: Negative for blurred vision, double vision, pain and redness.  Respiratory: Negative for cough, hemoptysis and shortness of breath.   Cardiovascular: Negative for chest pain, palpitations, orthopnea and leg swelling.  Gastrointestinal: Negative for abdominal pain, constipation, diarrhea, nausea and vomiting.  Genitourinary: Negative for dysuria,  frequency and hematuria.  Musculoskeletal: Negative for back pain, joint pain and neck pain.  Skin:       No acne, rash, or lesions  Neurological: Positive for sensory change and headaches. Negative for dizziness, tremors, focal weakness and weakness.  Endo/Heme/Allergies: Negative for polydipsia. Does not bruise/bleed easily.  Psychiatric/Behavioral: Negative for depression. The patient is not nervous/anxious and does not have insomnia.      VITAL SIGNS:   Vitals:   01/10/17 1553 01/10/17 1754  Pulse: 71   Resp: 16   Temp: 99 F (37.2 C) 98.9 F (37.2 C)  TempSrc: Oral    SpO2: 100%   Weight: 97.1 kg (214 lb)   Height: 5\' 7"  (1.702 m)    Wt Readings from Last 3 Encounters:  01/10/17 97.1 kg (214 lb)  11/21/16 97.4 kg (214 lb 12 oz)  04/23/16 96.6 kg (213 lb)    PHYSICAL EXAMINATION:  Physical Exam  Vitals reviewed. Constitutional: She is oriented to person, place, and time. She appears well-developed and well-nourished. No distress.  HENT:  Head: Normocephalic and atraumatic.  Mouth/Throat: Oropharynx is clear and moist.  Eyes: Conjunctivae and EOM are normal. Pupils are equal, round, and reactive to light. No scleral icterus.  Neck: Normal range of motion. Neck supple. No JVD present. No thyromegaly present.  Cardiovascular: Normal rate, regular rhythm and intact distal pulses.  Exam reveals no gallop and no friction rub.   No murmur heard. Respiratory: Effort normal and breath sounds normal. No respiratory distress. She has no wheezes. She has no rales.  GI: Soft. Bowel sounds are normal. She exhibits no distension. There is no tenderness.  Musculoskeletal: Normal range of motion. She exhibits no edema.  No arthritis, no gout  Lymphadenopathy:    She has no cervical adenopathy.  Neurological: She is alert and oriented to person, place, and time. No cranial nerve deficit.  Neurologic: Cranial nerves II-XII intact, Sensation intact to light touch except for left lower extremity which has mild decreased sensation to light touch, 5/5 strength in all extremities, no dysarthria, no aphasia, no dysphagia, memory intact, finger to nose testing showed no abnormality, no pronator drift  Skin: Skin is warm and dry. No rash noted. No erythema.  Psychiatric: She has a normal mood and affect. Her behavior is normal. Judgment and thought content normal.    LABORATORY PANEL:   CBC  Recent Labs Lab 01/10/17 1601  WBC 9.3  HGB 12.4  HCT 36.1  PLT 250    ------------------------------------------------------------------------------------------------------------------  Chemistries   Recent Labs Lab 01/10/17 1601  NA 138  K 3.7  CL 103  CO2 26  GLUCOSE 117*  BUN 12  CREATININE 0.77  CALCIUM 9.9  AST 26  ALT 11*  ALKPHOS 69  BILITOT 0.3   ------------------------------------------------------------------------------------------------------------------  Cardiac Enzymes No results for input(s): TROPONINI in the last 168 hours. ------------------------------------------------------------------------------------------------------------------  RADIOLOGY:  Dg Chest 1 View  Result Date: 01/10/2017 CLINICAL DATA:  Acute onset of headache and high blood pressure. Left leg numbness. Initial encounter. EXAM: CHEST 1 VIEW COMPARISON:  Chest radiograph performed 04/23/2016 FINDINGS: The lungs are well-aerated and clear. There is no evidence of focal opacification, pleural effusion or pneumothorax. The cardiomediastinal silhouette is within normal limits. No acute osseous abnormalities are seen. IMPRESSION: No acute cardiopulmonary process seen. Electronically Signed   By: Roanna Raider M.D.   On: 01/10/2017 17:03   Ct Head Wo Contrast  Result Date: 01/10/2017 CLINICAL DATA:  Worsening headaches for 1 week.  Weakness.  EXAM: CT HEAD WITHOUT CONTRAST TECHNIQUE: Contiguous axial images were obtained from the base of the skull through the vertex without intravenous contrast. COMPARISON:  07/16/2014 FINDINGS: Brain: There is no evidence of acute cortical infarct, intracranial hemorrhage, mass, midline shift, or extra-axial fluid collection. The ventricles and sulci are normal for age. A partially empty sella is incidentally noted. Vascular: No hyperdense vessel or unexpected calcification. Skull: No fracture or focal osseous lesion. Sinuses/Orbits: Minimal bilateral ethmoid sinus mucosal thickening, incompletely imaged. Clear mastoid air cells.  Visualized orbits are unremarkable. Other: None. IMPRESSION: No evidence of acute intracranial abnormality. Electronically Signed   By: Sebastian Ache M.D.   On: 01/10/2017 16:38   Mr Brain Wo Contrast  Result Date: 01/10/2017 CLINICAL DATA:  66 y/o F; 1 week of headache. Weakness and numbness in the left leg. EXAM: MRI HEAD WITHOUT CONTRAST TECHNIQUE: Multiplanar, multiecho pulse sequences of the brain and surrounding structures were obtained without intravenous contrast. COMPARISON:  01/10/2017 CT head.  05/11/2012 MRI of the brain. FINDINGS: Brain: Tiny faint focus of diffusion hyperintensity within left posterior limb of internal capsule. The Otherwise no acute infarction, hemorrhage, hydrocephalus, extra-axial collection or mass lesion. Few punctate foci of T2 FLAIR hyperintensity in subcortical and periventricular white matter compatible with minimal chronic microvascular ischemic changes and stable. No evidence for intracranial hemorrhage, mass effect, extra-axial collection, or hydrocephalus. Vascular: Normal flow voids. Skull and upper cervical spine: C3-4 advanced discogenic degenerative changes. Sinuses/Orbits: Mild diffuse paranasal sinus mucosal thickening. Orbits are unremarkable. No abnormal signal of mastoid air cells. Other: 7 mm left upper neck dermal nodule, probably dermal cyst. IMPRESSION: 1. Faint tiny focus of diffusion hyperintensity in left posterior limb of internal capsule, possibly representing a tiny acute/early subacute infarction. 2. Otherwise no acute intracranial abnormality. 3. Mild paranasal sinus disease. These results were called by telephone at the time of interpretation on 01/10/2017 at 7:38 pm to Dr. Willy Eddy , who verbally acknowledged these results. Electronically Signed   By: Mitzi Hansen M.D.   On: 01/10/2017 19:38    EKG:   Orders placed or performed during the hospital encounter of 01/10/17  . ED EKG  . ED EKG  . EKG 12-Lead  . EKG 12-Lead     IMPRESSION AND PLAN:  Principal Problem:   Stroke [facility]) - admitted per stroke admission orders set with appropriate imaging, labs, consults. Active Problems:   Hypertension - permissive hypertension for 24 hours to blood pressure goal less than 220/120, then restart antihypertensives   Diabetes type 2, controlled (HCC) - sliding scale insulin with corresponding glucose checks   GERD (gastroesophageal reflux disease) - home dose PPI   Mixed hyperlipidemia - continue home meds  All the records are reviewed and case discussed with ED provider. Management plans discussed with the patient and/or family.  DVT PROPHYLAXIS: SubQ lovenox  GI PROPHYLAXIS: PPI  ADMISSION STATUS: Observation  CODE STATUS: Full Code Status History    Date Active Date Inactive Code Status Order ID Comments User Context   12/24/2015 12:55 AM 12/24/2015  4:43 PM Full Code [id number]  Oralia Manis, MD Inpatient      TOTAL TIME TAKING CARE OF THIS PATIENT: 40 minutes.    Aly Hauser FIELDING 01/10/2017, 8:47 PM  [facility]  Office  [phone]  CC: Primary care physician; Jaclyn Shaggy, MD

## 2017-01-10 NOTE — ED Notes (Signed)
Receiving nurse in pt room at this time during attempt to call report. Left # with secretary to have Rande Lawmanrwin return call

## 2017-01-10 NOTE — Telephone Encounter (Signed)
Spoke with patient and she states that she just doesn't feel well and has felt this way for the past 3 days. She reports headaches that go down into her cheek bone, legs cramping, and elevated blood pressures from upper 140's over 80's and 90's to 150's over 100's. She states that she just doesn't feel right. She reports that she called her PCP and they directed her to us. Instructed her to go to emergency room for further evaluation of her symptoms. She verbalized understanding with no further questions at this time. Let her know that I would make Dr. Mariah MillingGollan aware and we would be in touch if he has any further recommendations.

## 2017-01-11 ENCOUNTER — Observation Stay: Payer: Medicare (Managed Care)

## 2017-01-11 ENCOUNTER — Observation Stay (HOSPITAL_BASED_OUTPATIENT_CLINIC_OR_DEPARTMENT_OTHER)
Admit: 2017-01-11 | Discharge: 2017-01-11 | Disposition: A | Discharge status: Home / Self Care | Payer: Medicare (Managed Care) | Attending: Internal Medicine | Admitting: Internal Medicine

## 2017-01-11 DIAGNOSIS — R200 Anesthesia of skin: Principal | ICD-10-CM

## 2017-01-11 DIAGNOSIS — I6350 Cerebral infarction due to unspecified occlusion or stenosis of unspecified cerebral artery: Secondary | ICD-10-CM | POA: Diagnosis not present

## 2017-01-11 DIAGNOSIS — R202 Paresthesia of skin: Secondary | ICD-10-CM | POA: Diagnosis not present

## 2017-01-11 DIAGNOSIS — G587 Mononeuritis multiplex: Secondary | ICD-10-CM

## 2017-01-11 DIAGNOSIS — E1141 Type 2 diabetes mellitus with diabetic mononeuropathy: Secondary | ICD-10-CM | POA: Diagnosis present

## 2017-01-11 LAB — URINALYSIS, COMPLETE (UACMP) WITH MICROSCOPIC
Bilirubin Urine: NEGATIVE
Glucose, UA: NEGATIVE mg/dL
HGB URINE DIPSTICK: NEGATIVE
Ketones, ur: NEGATIVE mg/dL
NITRITE: NEGATIVE
Protein, ur: NEGATIVE mg/dL
SPECIFIC GRAVITY, URINE: 1.016 (ref 1.005–1.030)
pH: 7 (ref 5.0–8.0)

## 2017-01-11 LAB — ECHOCARDIOGRAM COMPLETE
Height: 67 in
WEIGHTICAEL: 3424 [oz_av]

## 2017-01-11 LAB — LIPID PANEL
Cholesterol: 129 mg/dL (ref 0–200)
HDL: 47 mg/dL (ref >40)
LDL CALC: 68 mg/dL (ref 0–99)
TRIGLYCERIDES: 70 mg/dL (ref <150)
Total CHOL/HDL Ratio: 2.7 RATIO
VLDL: 14 mg/dL (ref 0–40)

## 2017-01-11 LAB — GLUCOSE, CAPILLARY
GLUCOSE-CAPILLARY: 137 mg/dL — AB (ref 65–99)
GLUCOSE-CAPILLARY: 152 mg/dL — AB (ref 65–99)
Glucose-Capillary: 110 mg/dL — ABNORMAL HIGH (ref 65–99)

## 2017-01-11 LAB — SEDIMENTATION RATE: Sed Rate: 23 mm/hr (ref 0–30)

## 2017-01-11 MED ORDER — ASPIRIN EC 81 MG PO TBEC
81.0000 mg | DELAYED_RELEASE_TABLET | Freq: Every day | ORAL | Status: DC
  Administered 2017-01-11: 81 mg via ORAL
  Filled 2017-01-11: qty 1

## 2017-01-11 MED ORDER — ASPIRIN 81 MG PO TABS: 81.0000 mg | ORAL_TABLET | Freq: Every day | ORAL | Status: AC

## 2017-01-11 MED ORDER — SODIUM CHLORIDE 0.9% FLUSH
3.0000 mL | Freq: Two times a day (BID) | INTRAVENOUS | Status: DC
  Administered 2017-01-11: 09:00:00 3 mL via INTRAVENOUS

## 2017-01-11 NOTE — Evaluation (Signed)
Occupational Therapy Evaluation Patient Details Name: Destiny BeltonHattie S Savage MRN: [id number] DOB: 1951/06/08 Today's Date: 01/11/2017    History of Present Illness Destiny Savage  is a 66 y.o. female who presents with headache and left lower extremity numbness. Patient states that she's been having headache intermittently for the past couple of days. Yesterday she developed left lower extremity numbness and decided to come in and be evaluated. Initially she called her primary care physician's office who referred her to cardiology within recommended she come in for evaluation. MRI in the ED here showed small acute to subacute stroke. Her symptoms have improved significantly here in the ED. Hospitalists were called for admission. At time of OT evaluation pt continues to report LLE sensory changes as well as mild left hand numbness. Denies any changes in strength or function in self care tasks.    Clinical Impression   Patient is a 66 yo female who was admitted to [facility] after having left lower extremity numbness and headache to rule out CVA.  Imaging suggests potential early subacute infarction.  Patient lives at home with her spouse in a one level home and was independent prior to admission for ADL and IADL tasks.  She presents with reported mild left hand numbness,  however she was evaluated by OT and does not have any current strength or coordination deficits. She is able to demonstrate Independence in dressing skills, grooming, self feeding and toileting.  Transferred from bed to chair independently for lunch.  Patient does not have any skilled OT needs at this time however if symptoms worsen or other problems arise, please reconsult OT.     Follow Up Recommendations  No OT follow up    Equipment Recommendations  None recommended by OT    Recommendations for Other Services       Precautions / Restrictions Precautions Precautions: Fall Precaution Comments: Fall precautions per PT Restrictions Weight  Bearing Restrictions: No      Mobility Bed Mobility Overal bed mobility: Independent             General bed mobility comments: Head of bed flat, no bed rails, good speed and sequencing noted  Transfers Overall transfer level: Independent Equipment used: None             General transfer comment: Pt demonstrates fair speed and sequencing with sit to stand transfers. No UE support and good stability in standing  Balance per PT eval:  Balance Overall balance assessment: Needs assistance Sitting-balance support: No upper extremity supported Sitting balance-Leahy Scale: Good     Standing balance support: No upper extremity supported Standing balance-Leahy Scale: Fair Standing balance comment: Patient able to maintain wide stance balance. Is able to bring feet together without upper extremity support however unable to maintain without loss of balance. Single-leg balance is approximately 1-2 seconds on each lower extremity                            ADL Overall ADL's : Independent                                       General ADL Comments: No current deficits in self care tasks.     Vision Baseline Vision/History: No visual deficits Patient Visual Report: No change from baseline       Perception     Praxis  Pertinent Vitals/Pain Pain Assessment: 0-10 Pain Score: 4  Pain Location: Frontal headache Pain Descriptors / Indicators: Pressure Pain Intervention(s): Limited activity within patient's tolerance;Monitored during session     Hand Dominance Left   Extremity/Trunk Assessment Upper Extremity Assessment Upper Extremity Assessment: Overall WFL for tasks assessed LUE Deficits / Details: BUE strength and ROM within functional limits, reports mild decreased sensation to left hand.  Finger to nose and rapid alternating movements intact.  Oppositional movements intact and handwriting is 100% legible and patient reports it looks and  feels like her normal writing.    Lower Extremity Assessment Lower Extremity Assessment: Defer to PT evaluation LLE Deficits / Details: Right lower extremity strength grossly within functional limits. No clonus or rigidity noted in bilateral lower extremity. Patient reports decreased light touch sensation to left foot.   Cervical / Trunk Assessment Cervical / Trunk Assessment: Normal   Communication Communication Communication: No difficulties   Cognition Arousal/Alertness: Awake/alert Behavior During Therapy: WFL for tasks assessed/performed Overall Cognitive Status: Within Functional Limits for tasks assessed                     General Comments       Exercises       Shoulder Instructions      Home Living Family/patient expects to be discharged to:: Private residence Living Arrangements: Spouse/significant other Available Help at Discharge: Family Type of Home: House Home Access: Level entry     Home Layout: One level     Bathroom Shower/Tub: Chief Strategy Officer: Standard Bathroom Accessibility: Yes   Home Equipment: Environmental consultant - 2 wheels   Additional Comments: No other equipment at home that pt has access to. No grab bars in shower and no seat      Prior Functioning/Environment Level of Independence: Independent        Comments: Pt reports independence with ADLs/IADLs. 1 fall in the last 12 months        OT Problem List: Impaired sensation      OT Treatment/Interventions:      OT Goals(Current goals can be found in the care plan section) Acute Rehab OT Goals Patient Stated Goal: Patient reports she wants to go home.  OT Goal Formulation: With patient Time For Goal Achievement: 01/18/17 Potential to Achieve Goals: Good  OT Frequency:     Barriers to D/C:            Co-evaluation              End of Session    Activity Tolerance: Patient tolerated treatment well Patient left: in chair;with call bell/phone within  reach                   ADL either performed or assessed with clinical judgement  Time: 1145-1205 OT Time Calculation (min): 20 min Charges:  OT General Charges $OT Visit: 1 Procedure OT Evaluation $OT Eval Low Complexity: 1 Procedure G-Codes: OT G-codes **NOT FOR INPATIENT CLASS** Functional Assessment Tool Used: AM-PAC 6 Clicks Daily Activity;Clinical judgement Functional Limitation: Self care Self Care Current Status (W4132): 0 percent impaired, limited or restricted Self Care Goal Status (G4010): 0 percent impaired, limited or restricted Self Care Discharge Status (U7253): 0 percent impaired, limited or restricted   Amy T Lovett, OTR/L, CLT   Lovett,Amy 01/11/2017, 12:16 PM

## 2017-01-11 NOTE — Progress Notes (Signed)
Oral and written AVS instructions given to pt and family with stated understanding. Stroke education sheet printed and given with improved health choices/lifestyle.  States ready to be discharged home with husband.

## 2017-01-11 NOTE — Progress Notes (Signed)
Pt reports frontal HA resolved with tylenol x 1 dose. Stroke studies completed. Reports left leg numbness improving. Up ad lib in room and tolerated well. Oral and stroke education continued with pt and family with pt encouraged to read stroke booklet. VSS Neuro checks unchanged

## 2017-01-11 NOTE — Care Management Obs Status (Signed)
MEDICARE OBSERVATION STATUS NOTIFICATION   Patient Details  Name: Destiny BeltonHattie S Savage MRN: [id number] Date of Birth: 02-19-1951   Medicare Observation Status Notification Given:  Yes    Alleah Dearman A, RN 01/11/2017, 4:28 PM

## 2017-01-11 NOTE — Progress Notes (Signed)
Transported to private vehicle in transport chair. Discharge home to self care/in care of her husband.

## 2017-01-11 NOTE — Consult Note (Signed)
Referring Physician: Sudini    Chief Complaint: LLE numbness  HPI: Destiny Savage is an 66 y.o. female who reports that she has been having headaches for the past 3-4 days.  She doe shave a history of headaches but reports they are not every day.  The headaches seemed to be persistent and then yesterday she noted LLE tingling and numbness.  She reports the tingling form the foot to the knee.  Patient presented for evaluation at that time.  Initial NIHSS of 3.    Date last known well: Date: 01/10/2017 Time last known well: Time: 11:00 tPA Given: No: Outside time window  Past Medical History:  Diagnosis Date  . Arthritis   . Asthma   . Diabetes mellitus   . GERD (gastroesophageal reflux disease)   . Hyperlipidemia   . Hypertension   . Mini stroke ([place])   . Stroke [facility])     Past Surgical History:  Procedure Laterality Date  . ABDOMINAL HYSTERECTOMY    . CARDIAC CATHETERIZATION    . CHOLECYSTECTOMY      Family History  Problem Relation Age of Onset  . Heart Problems Father   . Hypertension    . CAD     Social History:  reports that she has never smoked. She has never used smokeless tobacco. She reports that she does not drink alcohol or use drugs.  Allergies:  Allergies  Allergen Reactions  . Latex Hives  . Lidocaine Hives    Medications:  I have reviewed the patient's current medications. Prior to Admission:  Prescriptions Prior to Admission  Medication Sig Dispense Refill Last Dose  . carvedilol (COREG) 6.25 MG tablet Take 6.25 mg by mouth 2 (two) times daily with a meal.   01/10/2017 at am  . clopidogrel (PLAVIX) 75 MG tablet Take 75 mg by mouth daily.   01/10/2017 at am  . furosemide (LASIX) 20 MG tablet TAKE ONE TABLET BY MOUTH ONCE DAILY 30 tablet 6 01/10/2017 at am  . omeprazole (PRILOSEC) 20 MG capsule Take 20 mg by mouth daily.   01/10/2017 at am  . potassium chloride (K-DUR) 10 MEQ tablet Take 1 tablet (10 mEq total) by mouth daily. 90 tablet 3 01/10/2017 at am   . simvastatin (ZOCOR) 40 MG tablet Take 40 mg by mouth every evening.   01/10/2017 at am  . docusate sodium (COLACE) 100 MG capsule Take 1 capsule (100 mg total) by mouth daily as needed. (Patient not taking: Reported on 01/10/2017) 30 capsule 2 Not Taking at Unknown time   Scheduled: .  stroke: mapping our early stages of recovery book   Does not apply Once  . aspirin EC  81 mg Oral Daily  . clopidogrel  75 mg Oral Daily  . enoxaparin (LOVENOX) injection  40 mg Subcutaneous Q24H  . insulin aspart  0-5 Units Subcutaneous QHS  . insulin aspart  0-9 Units Subcutaneous TID WC  . pantoprazole  40 mg Oral Daily  . simvastatin  40 mg Oral QPM  . sodium chloride flush  3 mL Intravenous Q12H    ROS: History obtained from the patient  General ROS: negative for - chills, fatigue, fever, night sweats, weight gain or weight loss Psychological ROS: negative for - behavioral disorder, hallucinations, memory difficulties, mood swings or suicidal ideation Ophthalmic ROS: negative for - blurry vision, double vision, eye pain or loss of vision ENT ROS: negative for - epistaxis, nasal discharge, oral lesions, sore throat, tinnitus or vertigo Allergy and Immunology ROS:  negative for - hives or itchy/watery eyes Hematological and Lymphatic ROS: negative for - bleeding problems, bruising or swollen lymph nodes Endocrine ROS: negative for - galactorrhea, hair pattern changes, polydipsia/polyuria or temperature intolerance Respiratory ROS: negative for - cough, hemoptysis, shortness of breath or wheezing Cardiovascular ROS: negative for - chest pain, dyspnea on exertion, edema or irregular heartbeat Gastrointestinal ROS: negative for - abdominal pain, diarrhea, hematemesis, nausea/vomiting or stool incontinence Genito-Urinary ROS: negative for - dysuria, hematuria, incontinence or urinary frequency/urgency Musculoskeletal ROS: cramping left leg, back pain Neurological ROS: as noted in HPI Dermatological ROS:  negative for rash and skin lesion changes  Physical Examination: Blood pressure (!) 153/75, pulse 78, temperature 98 F (36.7 C), temperature source Oral, resp. rate 20, height '5\' 7"'  (1.702 m), weight 97.1 kg (214 lb), SpO2 99 %.  Gen: NAD HEENT-  Normocephalic, no lesions, without obvious abnormality.  Normal external eye and conjunctiva.  Normal TM's bilaterally.  Normal auditory canals and external ears. Normal external nose, mucus membranes and septum.  Normal pharynx. Cardiovascular- S1, S2 normal, pulses palpable throughout   Lungs- chest clear, no wheezing, rales, normal symmetric air entry Abdomen- soft, non-tender; bowel sounds normal; no masses,  no organomegaly Extremities- no edema Lymph-no adenopathy palpable Musculoskeletal-no joint tenderness, deformity or swelling Skin-warm and dry, no hyperpigmentation, vitiligo, or suspicious lesions  Neurological Examination   Mental Status: Alert, oriented, thought content appropriate.  Speech fluent without evidence of aphasia.  Able to follow 3 step commands without difficulty.  Flat affect. Cranial Nerves: II: Discs flat bilaterally; Visual fields grossly normal, pupils equal, round, reactive to light and accommodation III,IV, VI: ptosis not present, extra-ocular motions intact bilaterally V,VII: decrease in right NLF, facial light touch sensation normal bilaterally VIII: hearing normal bilaterally IX,X: gag reflex present XI: bilateral shoulder shrug XII: midline tongue extension Motor: Right : Upper extremity   5/5    Left:     Upper extremity   5/5  Lower extremity   5/5     Lower extremity   5/5 Tone and bulk:normal tone throughout; no atrophy noted Sensory: Pinprick and light touch decreased in the LLE Deep Tendon Reflexes: 2+ in the upper extremities and absent in the lower extremities Plantars: Right: downgoing   Left: downgoing Cerebellar: Normal finger-to-nose and normal heel-to-shin testing bilaterally Gait: not  tested due to safety concerns    Laboratory Studies:  Basic Metabolic Panel:  Recent Labs Lab 01/10/17 1601  NA 138  K 3.7  CL 103  CO2 26  GLUCOSE 117*  BUN 12  CREATININE 0.77  CALCIUM 9.9    Liver Function Tests:  Recent Labs Lab 01/10/17 1601  AST 26  ALT 11*  ALKPHOS 69  BILITOT 0.3  PROT 7.6  ALBUMIN 4.4   No results for input(s): LIPASE, AMYLASE in the last 168 hours. No results for input(s): AMMONIA in the last 168 hours.  CBC:  Recent Labs Lab 01/10/17 1601  WBC 9.3  NEUTROABS 5.4  HGB 12.4  HCT 36.1  MCV 88.0  PLT 250    Cardiac Enzymes: No results for input(s): CKTOTAL, CKMB, CKMBINDEX, TROPONINI in the last 168 hours.  BNP: Invalid input(s): POCBNP  CBG:  Recent Labs Lab 01/10/17 2238 01/11/17 0749 01/11/17 1136  GLUCAP 100* 137* 152*    Microbiology: Results for orders placed or performed in visit on 03/08/12  Urine culture     Status: None   Collection Time: 03/08/12  4:50 AM  Result Value Ref Range Status  Micro Text Report   Final       SOURCE: CC    COMMENT                   NO GROWTH IN 18-24 HOURS   ANTIBIOTIC                                                        Coagulation Studies:  Recent Labs  01/10/17 1601  LABPROT 12.7  INR 0.95    Urinalysis:  Recent Labs Lab 01/10/17 1601  COLORURINE YELLOW*  LABSPEC 1.016  PHURINE 7.0  GLUCOSEU NEGATIVE  HGBUR NEGATIVE  BILIRUBINUR NEGATIVE  KETONESUR NEGATIVE  PROTEINUR NEGATIVE  NITRITE NEGATIVE  LEUKOCYTESUR LARGE*    Lipid Panel:    Component Value Date/Time   CHOL 129 01/11/2017 0525   CHOL 191 07/10/2014 0235   TRIG 70 01/11/2017 0525   TRIG 105 07/10/2014 0235   HDL 47 01/11/2017 0525   HDL 88 (H) 07/10/2014 0235   CHOLHDL 2.7 01/11/2017 0525   VLDL 14 01/11/2017 0525   VLDL 21 07/10/2014 0235   LDLCALC 68 01/11/2017 0525   LDLCALC 82 07/10/2014 0235    HgbA1C:  Lab Results  Component Value Date   HGBA1C 6.3 (H) 12/24/2015     Urine Drug Screen:     Component Value Date/Time   LABOPIA NEGATIVE 04/07/2012 1108   COCAINSCRNUR NEGATIVE 04/07/2012 1108   LABBENZ POSITIVE 04/07/2012 1108   AMPHETMU NEGATIVE 04/07/2012 1108   THCU NEGATIVE 04/07/2012 1108   LABBARB NEGATIVE 04/07/2012 1108    Alcohol Level: No results for input(s): ETH in the last 168 hours.  Other results: EKG: sinus rhythm at 68 bpm.  Imaging: Dg Chest 1 View  Result Date: 01/10/2017 CLINICAL DATA:  Acute onset of headache and high blood pressure. Left leg numbness. Initial encounter. EXAM: CHEST 1 VIEW COMPARISON:  Chest radiograph performed 04/23/2016 FINDINGS: The lungs are well-aerated and clear. There is no evidence of focal opacification, pleural effusion or pneumothorax. The cardiomediastinal silhouette is within normal limits. No acute osseous abnormalities are seen. IMPRESSION: No acute cardiopulmonary process seen. Electronically Signed   By: Garald Balding M.D.   On: 01/10/2017 17:03   Ct Head Wo Contrast  Result Date: 01/10/2017 CLINICAL DATA:  Worsening headaches for 1 week.  Weakness. EXAM: CT HEAD WITHOUT CONTRAST TECHNIQUE: Contiguous axial images were obtained from the base of the skull through the vertex without intravenous contrast. COMPARISON:  07/16/2014 FINDINGS: Brain: There is no evidence of acute cortical infarct, intracranial hemorrhage, mass, midline shift, or extra-axial fluid collection. The ventricles and sulci are normal for age. A partially empty sella is incidentally noted. Vascular: No hyperdense vessel or unexpected calcification. Skull: No fracture or focal osseous lesion. Sinuses/Orbits: Minimal bilateral ethmoid sinus mucosal thickening, incompletely imaged. Clear mastoid air cells. Visualized orbits are unremarkable. Other: None. IMPRESSION: No evidence of acute intracranial abnormality. Electronically Signed   By: Logan Bores M.D.   On: 01/10/2017 16:38   Mr Brain Wo Contrast  Result Date:  01/10/2017 CLINICAL DATA:  66 y/o F; 1 week of headache. Weakness and numbness in the left leg. EXAM: MRI HEAD WITHOUT CONTRAST TECHNIQUE: Multiplanar, multiecho pulse sequences of the brain and surrounding structures were obtained without intravenous contrast. COMPARISON:  01/10/2017 CT head.  05/11/2012 MRI  of the brain. FINDINGS: Brain: Tiny faint focus of diffusion hyperintensity within left posterior limb of internal capsule. The Otherwise no acute infarction, hemorrhage, hydrocephalus, extra-axial collection or mass lesion. Few punctate foci of T2 FLAIR hyperintensity in subcortical and periventricular white matter compatible with minimal chronic microvascular ischemic changes and stable. No evidence for intracranial hemorrhage, mass effect, extra-axial collection, or hydrocephalus. Vascular: Normal flow voids. Skull and upper cervical spine: C3-4 advanced discogenic degenerative changes. Sinuses/Orbits: Mild diffuse paranasal sinus mucosal thickening. Orbits are unremarkable. No abnormal signal of mastoid air cells. Other: 7 mm left upper neck dermal nodule, probably dermal cyst. IMPRESSION: 1. Faint tiny focus of diffusion hyperintensity in left posterior limb of internal capsule, possibly representing a tiny acute/early subacute infarction. 2. Otherwise no acute intracranial abnormality. 3. Mild paranasal sinus disease. These results were called by telephone at the time of interpretation on 01/10/2017 at 7:38 pm to Dr. Merlyn Lot , who verbally acknowledged these results. Electronically Signed   By: Kristine Garbe M.D.   On: 01/10/2017 19:38   US Carotid Bilateral (at [facility] And [facility] Only)  Result Date: 01/11/2017 CLINICAL DATA:  66 year old female with acute left basal ganglia infarct EXAM: BILATERAL CAROTID DUPLEX ULTRASOUND TECHNIQUE: Pearline Cables scale imaging, color Doppler and duplex ultrasound were performed of bilateral carotid and vertebral arteries in the neck. COMPARISON:  Brain MRI  01/10/2017 prior duplex carotid ultrasound 03/08/2012 FINDINGS: Criteria: Quantification of carotid stenosis is based on velocity parameters that correlate the residual internal carotid diameter with NASCET-based stenosis levels, using the diameter of the distal internal carotid lumen as the denominator for stenosis measurement. The following velocity measurements were obtained: RIGHT ICA:  92/28 cm/sec CCA:  04/54 cm/sec SYSTOLIC ICA/CCA RATIO:  1.6 DIASTOLIC ICA/CCA RATIO:  2.4 ECA:  57 cm/sec LEFT ICA:  117/22 cm/sec CCA:  09/81 cm/sec SYSTOLIC ICA/CCA RATIO:  1.9 DIASTOLIC ICA/CCA RATIO:  1.2 ECA:  103 cm/sec RIGHT CAROTID ARTERY: Scattered heterogeneous and partially calcified atherosclerotic plaque throughout the common carotid artery extending into the proximal internal and external carotid arteries. By peak systolic velocity criteria the estimated stenosis remains less than 50%. RIGHT VERTEBRAL ARTERY:  Patent with normal antegrade flow. LEFT CAROTID ARTERY: Heterogeneous and partially calcified atherosclerotic plaque in the distal common carotid artery extending into the carotid bulb. There is a region were the plaque creates shadowing obscuring the vascular lumen. However, the peak systolic velocity criteria suggested the estimated stenosis remains less than 50%. The arterial waveforms also remain unremarkable. LEFT VERTEBRAL ARTERY:  Patent with normal antegrade flow. IMPRESSION: 1. Mild (1-49%) stenosis proximal right internal carotid artery secondary to heterogeneous and partially calcified atherosclerotic plaque. 2. Mild (1-49%) stenosis proximal left internal carotid artery secondary to heterogenous atherosclerotic plaque. 3. Vertebral arteries are patent with normal antegrade flow. Signed, Criselda Peaches, MD [facility] Radiology Electronically Signed   By: Jacqulynn Cadet M.D.   On: 01/11/2017 10:56   Mr Jodene Nam Head/brain XB Cm  Result Date:  01/11/2017 CLINICAL DATA:  Acute punctate white matter infarct within the posterior limb of the left internal capsule. EXAM: MRA HEAD WITHOUT CONTRAST TECHNIQUE: Angiographic images of the Circle of Willis were obtained using MRA technique without intravenous contrast. COMPARISON:  MRI brain 01/10/2017 FINDINGS: The internal carotid arteries are within normal limits from the high cervical segments through the ICA termini. The A1 and M1 segments are normal. Fetal type posterior cerebral arteries are present bilaterally. MCA bifurcations are intact. There is some attenuation of distal  MCA branch vessels bilaterally. The vertebral arteries are codominant. The left AICA is dominant. The basilar artery is small and terminates at the superior cerebellar arteries. Fetal type posterior cerebral arteries are present bilaterally. There is some attenuation of distal PCA branch vessels, left greater than right. IMPRESSION: 1. No significant proximal stenosis, aneurysm, or branch vessel occlusion. 2. Mild medium and distal small vessel disease. 3. Fetal type posterior cerebral arteries bilaterally. Electronically Signed   By: San Morelle M.D.   On: 01/11/2017 09:29    Assessment: 66 y.o. female presenting with headache and LLE numbness.  MRI of the brain reviewed and shows a possible left posterior internal capsular infarct suspected to be of small vessel etiology.  This is not consistent though with the patient's complaints that continue.  Carotid dopplers show no evidence of hemodynamically significant stenosis.  Echocardiogram shows no cardiac source of emboli with an EF of 60-65%.  A1c pending, LDL 68 on a statin.  With stroke and TIA being less likely would investigate lumbar spine as a possible etiology.   Stroke Risk Factors - diabetes mellitus, hyperlipidemia and hypertension  Plan: 1. Continue Plavix 2.  MRI of the lumbar spine 3.  ESR      Alexis Goodell, MD Neurology [phone] 01/11/2017,  2:13 PM

## 2017-01-11 NOTE — Progress Notes (Signed)
SLP Cancellation Note  Patient Details Name: Destiny Savage MRN: [id number] DOB: 1951-01-31   Cancelled treatment:       Reason Eval/Treat Not Completed: SLP screened, no needs identified, will sign off (chart reviewed; consulted pt/family then NSG). Met w/ pt who was completing assessment w/ NSG; no apparent language deficits noted. Pt conversed at conversational level. Pt also began eating her breakfast meal w/ no noted dysphagia; pt denied any difficulty swallowing. Pt is on a regular diet consistency.  No further skilled ST services indicated at this time; NSG to reconsult if indicated. NSG agreed.  Orinda Kenner, MS, CCC-SLP Watson,Katherine 01/11/2017, 9:46 AM

## 2017-01-11 NOTE — Evaluation (Signed)
Physical Therapy Evaluation Patient Details Name: Destiny Savage MRN: [id number] DOB: 02/15/1951 Today's Date: 01/11/2017   History of Present Illness  Destiny Savage  is a 66 y.o. female who presents with headache and left lower extremity numbness. Patient states that she's been having headache intermittently for the past couple of days. Yesterday she developed left lower extremity numbness and decided to come in and be evaluated. Initially she called her primary care physician's office who referred her to cardiology within recommended she come in for evaluation. MRI in the ED here showed small acute to subacute stroke. Her symptoms have improved significantly here in the ED. Hospitalists were called for admission. At time of PT evaluation pt continues to report LLE sensory changes. She has difficulty describing. Denies any changes in strength.  Clinical Impression  Pt admitted with above diagnosis. Pt currently with functional limitations due to the deficits listed below (see PT Problem List).  Patient reports decreased sensation to left hand and left foot with light touch. No abnormal tone in bilateral UE/LE. No deficits in strength appreciated with manual muscle testing. Patient is independent with bed mobility and transfers. She ambulates with decreased step length and wide base of support. She presents with balance deficits in narrow stand and with single leg balance. She would benefit from outpatient physical therapy to work on her balance in order to decrease fall risk and improve function home. Pt will benefit from skilled PT services to address deficits in strength, balance, and mobility in order to return to full function at home.     Follow Up Recommendations Outpatient PT;Other (comment) (For balance)    Equipment Recommendations  None recommended by PT    Recommendations for Other Services       Precautions / Restrictions Precautions Precautions: Fall Restrictions Weight Bearing  Restrictions: No      Mobility  Bed Mobility Overal bed mobility: Independent             General bed mobility comments: Head of bed flat, no bed rails, good speed and sequencing noted  Transfers Overall transfer level: Independent Equipment used: None             General transfer comment: Pt demonstrates fair speed and sequencing with sit to stand transfers. No UE support and good stability in standing  Ambulation/Gait Ambulation/Gait assistance: Min guard Ambulation Distance (Feet): 120 Feet Assistive device: None Gait Pattern/deviations: Decreased step length - right;Decreased step length - left Gait velocity: Decreased Gait velocity interpretation: Below normal speed for age/gender General Gait Details: Pt ambulates partially around RN station and back to bed. Decreased step length and gait speed. She demonstrates difficulty changing gait speed. Good stability noted without upper extremity support. Vital signs stable throughout entire ambulation distance.  Stairs            Wheelchair Mobility    Modified Rankin (Stroke Patients Only)       Balance Overall balance assessment: Needs assistance Sitting-balance support: No upper extremity supported Sitting balance-Leahy Scale: Good     Standing balance support: No upper extremity supported Standing balance-Leahy Scale: Fair Standing balance comment: Patient able to maintain wide stance balance. Is able to bring feet together without upper extremity support however unable to maintain without loss of balance. Single-leg balance is approximately 1-2 seconds on each lower extremity                             Pertinent Vitals/Pain  Pain Assessment: 0-10 Pain Score: 7  Pain Location: Frontal headache Pain Descriptors / Indicators: Pressure Pain Intervention(s): Monitored during session;Premedicated before session    Home Living Family/patient expects to be discharged to:: Private  residence Living Arrangements: Spouse/significant other Available Help at Discharge: Family Type of Home: House Home Access: Level entry     Home Layout: One level Home Equipment: Environmental consultant - 2 wheels Additional Comments: No other equipment at home that pt has access to. No grab bars in shower and no seat    Prior Function Level of Independence: Independent         Comments: Pt reports independence with ADLs/IADLs. 1 fall in the last 12 months     Hand Dominance   Dominant Hand: Left    Extremity/Trunk Assessment   Upper Extremity Assessment Upper Extremity Assessment: LUE deficits/detail LUE Deficits / Details: Right upper extremity strength grossly within functional limits. Patient reports decrease light-touch sensation to left hand, however left forearm and left upper arm are symmetrical compared to right side. Left grip strength is normal, rapid alternating movements of upper extremities also normal. No clonus or rigidity noted.    Lower Extremity Assessment Lower Extremity Assessment: LLE deficits/detail LLE Deficits / Details: Right lower extremity strength grossly within functional limits. No clonus or rigidity noted in bilateral lower extremity. Patient reports decreased light touch sensation to left foot.       Communication   Communication: No difficulties  Cognition Arousal/Alertness: Awake/alert Behavior During Therapy: WFL for tasks assessed/performed Overall Cognitive Status: Within Functional Limits for tasks assessed                      General Comments      Exercises     Assessment/Plan    PT Assessment Patient needs continued PT services  PT Problem List Decreased balance;Decreased knowledge of use of DME;Decreased safety awareness;Obesity;Impaired sensation       PT Treatment Interventions Gait training;DME instruction;Stair training;Functional mobility training;Therapeutic activities;Therapeutic exercise;Neuromuscular  re-education;Balance training;Patient/family education    PT Goals (Current goals can be found in the Care Plan section)  Acute Rehab PT Goals Patient Stated Goal: Return to prior level of function PT Goal Formulation: With patient/family Time For Goal Achievement: 01/25/17 Potential to Achieve Goals: Good    Frequency 7X/week   Barriers to discharge        Co-evaluation               End of Session Equipment Utilized During Treatment: Gait belt Activity Tolerance: Patient tolerated treatment well Patient left: in bed;with call bell/phone within reach;Other (comment);with family/visitor present (mod fall risk)   PT Visit Diagnosis: Unsteadiness on feet (R26.81)    Functional Assessment Tool Used: AM-PAC 6 Clicks Basic Mobility;Clinical judgement Functional Limitation: Mobility: Walking and moving around Mobility: Walking and Moving Around Current Status (Z6109): At least 20 percent but less than 40 percent impaired, limited or restricted Mobility: Walking and Moving Around Goal Status [phone]): At least 1 percent but less than 20 percent impaired, limited or restricted    Time: 1007-1027 PT Time Calculation (min) (ACUTE ONLY): 20 min   Charges:   PT Evaluation $PT Eval Low Complexity: 1 Procedure     PT G Codes:   PT G-Codes **NOT FOR INPATIENT CLASS** Functional Assessment Tool Used: AM-PAC 6 Clicks Basic Mobility;Clinical judgement Functional Limitation: Mobility: Walking and moving around Mobility: Walking and Moving Around Current Status (U9811): At least 20 percent but less than 40 percent impaired, limited  or restricted Mobility: Walking and Moving Around Goal Status [phone](G8979): At least 1 percent but less than 20 percent impaired, limited or restricted    Lynnea MaizesJason D Yassine Brunsman PT, DPT   Peace Jost 01/11/2017, 10:45 AM

## 2017-01-11 NOTE — Progress Notes (Signed)
*  PRELIMINARY RESULTS* Echocardiogram 2D Echocardiogram has been performed.  Destiny Savage 01/11/2017, 11:32 AM

## 2017-01-12 LAB — HEMOGLOBIN A1C
HEMOGLOBIN A1C: 6.7 % — AB (ref 4.8–5.6)
MEAN PLASMA GLUCOSE: 146 mg/dL

## 2017-01-14 NOTE — Discharge Summary (Signed)
[employer] - [place] at [facility]   PATIENT NAME: Destiny FranklinHattie Ebel    MR#:  [id number]  DATE OF BIRTH:  1951-03-06  DATE OF ADMISSION:  01/10/2017 ADMITTING PHYSICIAN: Oralia Manisavid Willis, MD  DATE OF DISCHARGE: 01/11/2017  6:37 PM  PRIMARY CARE PHYSICIAN: Jaclyn ShaggyATE,DENNY C, MD   ADMISSION DIAGNOSIS:  Weakness of left lower extremity [R29.898] Acute nonintractable headache, unspecified headache type [R51] Transient cerebral ischemia, unspecified type [G45.9]  DISCHARGE DIAGNOSIS:  Principal Problem:   Stroke [facility](HCC) Active Problems:   Hypertension   Diabetes type 2, controlled (HCC)   GERD (gastroesophageal reflux disease)   Mixed hyperlipidemia   Mononeuritis multiplex, diabetic (HCC)   SECONDARY DIAGNOSIS:   Past Medical History:  Diagnosis Date  . Arthritis   . Asthma   . Diabetes mellitus   . GERD (gastroesophageal reflux disease)   . Hyperlipidemia   . Hypertension   . Mini stroke (HCC)   . Stroke [facility](HCC)      ADMITTING HISTORY  HISTORY OF PRESENT ILLNESS:  Destiny FranklinHattie Savage  is a 66 y.o. female who presents with Headache and left lower extremity numbness. Patient states that she's been having headache intermittently for the past couple of days. Today she developed left lower extremity numbness and decided to come in and be evaluated. Initially she called her primary care physician's office who referred her to cardiology within recommended she come in for evaluation. MRI in the ED here showed small acute to subacute stroke. Her symptoms have improved significantly here in the ED. Hospitalists were called for admission.   HOSPITAL COURSE:   * LLE numbness - CVA ruled out Patient admitted to hospitalist service due to concern regarding stroke. Neuro checks every 4 hours showed no change. MRI of the brain showed no acute stroke. This was followed by an MRI of the lumbar spine which showed degenerative disc disease but no significant findings. After discussing with  neurology Dr. Thad Rangereynolds findings were thought to be due to diabetes. Mononeuritis multiplex. Patient is being referred to neurology as outpatient for nerve conduction studies. Patient will continue her Plavix and statin.  PCP and neurology follow-up in one week. Discharged home in stable condition.   CONSULTS OBTAINED:  Treatment Team:  Thana FarrLeslie Reynolds, MD  DRUG ALLERGIES:   Allergies  Allergen Reactions  . Latex Hives  . Lidocaine Hives    DISCHARGE MEDICATIONS:   Discharge Medication List as of 01/11/2017  6:04 PM    START taking these medications   Details  aspirin 81 MG tablet Take 1 tablet (81 mg total) by mouth daily., Starting Sat 01/11/2017, No Print      CONTINUE these medications which have NOT CHANGED   Details  carvedilol (COREG) 6.25 MG tablet Take 6.25 mg by mouth 2 (two) times daily with a meal., Historical Med    clopidogrel (PLAVIX) 75 MG tablet Take 75 mg by mouth daily., Historical Med    furosemide (LASIX) 20 MG tablet TAKE ONE TABLET BY MOUTH ONCE DAILY, Normal    omeprazole (PRILOSEC) 20 MG capsule Take 20 mg by mouth daily., Historical Med    potassium chloride (K-DUR) 10 MEQ tablet Take 1 tablet (10 mEq total) by mouth daily., Starting Thu 11/21/2016, Historical Med    simvastatin (ZOCOR) 40 MG tablet Take 40 mg by mouth every evening., Historical Med    docusate sodium (COLACE) 100 MG capsule Take 1 capsule (100 mg total) by mouth daily as needed., Starting Tue 04/23/2016, Until Wed 04/23/2017, Print  Today   VITAL SIGNS:  Blood pressure (!) 153/75, pulse 78, temperature 98 F (36.7 C), temperature source Oral, resp. rate 20, height 5\' 7"  (1.702 m), weight 97.1 kg (214 lb), SpO2 99 %.  I/O:  No intake or output data in the 24 hours ending 01/14/17 1235  PHYSICAL EXAMINATION:  Physical Exam  GENERAL:  66 y.o.-year-old patient lying in the bed with no acute distress.  LUNGS: Normal breath sounds bilaterally, no wheezing, rales,rhonchi or  crepitation. No use of accessory muscles of respiration.  CARDIOVASCULAR: S1, S2 normal. No murmurs, rubs, or gallops.  ABDOMEN: Soft, non-tender, non-distended. Bowel sounds present. No organomegaly or mass.  NEUROLOGIC: Moves all 4 extremities. PSYCHIATRIC: The patient is alert and oriented x 3.  SKIN: No obvious rash, lesion, or ulcer.   DATA REVIEW:   CBC  Recent Labs Lab 01/10/17 1601  WBC 9.3  HGB 12.4  HCT 36.1  PLT 250    Chemistries   Recent Labs Lab 01/10/17 1601  NA 138  K 3.7  CL 103  CO2 26  GLUCOSE 117*  BUN 12  CREATININE 0.77  CALCIUM 9.9  AST 26  ALT 11*  ALKPHOS 69  BILITOT 0.3    Cardiac Enzymes No results for input(s): TROPONINI in the last 168 hours.  Microbiology Results  Results for orders placed or performed in visit on 03/08/12  Urine culture     Status: None   Collection Time: 03/08/12  4:50 AM  Result Value Ref Range Status   Micro Text Report   Final       SOURCE: CC    COMMENT                   NO GROWTH IN 18-24 HOURS   ANTIBIOTIC                                                        RADIOLOGY:  No results found.  Follow up with PCP in 1 week.  Management plans discussed with the patient, family and they are in agreement.  CODE STATUS:  Code Status History    Date Active Date Inactive Code Status Order ID Comments User Context   01/10/2017 10:24 PM 01/11/2017  9:50 PM Full Code [id number]  Oralia Manis, MD Inpatient   12/24/2015 12:55 AM 12/24/2015  4:43 PM Full Code [id number]  Oralia Manis, MD Inpatient      TOTAL TIME TAKING CARE OF THIS PATIENT ON DAY OF DISCHARGE: more than 30 minutes.   Milagros Loll R M.D on 01/14/2017 at 12:35 PM  Between 7am to 6pm - Pager - [phone]  After 6pm go to [email or web address] - password [email or web address] [facility]  [facility] [place] [facility]  Office  [phone]  CC: Primary care physician; Jaclyn Shaggy, MD  Note: This dictation was prepared with Dragon dictation along with smaller phrase  technology. Any transcriptional errors that result from this process are unintentional.

## 2017-01-20 ENCOUNTER — Ambulatory Visit (INDEPENDENT_AMBULATORY_CARE_PROVIDER_SITE_OTHER): Payer: Medicare Other | Admitting: Podiatry

## 2017-01-20 ENCOUNTER — Encounter: Payer: Self-pay | Admitting: Podiatry

## 2017-01-20 DIAGNOSIS — M79609 Pain in unspecified limb: Secondary | ICD-10-CM | POA: Diagnosis not present

## 2017-01-20 DIAGNOSIS — B351 Tinea unguium: Secondary | ICD-10-CM | POA: Diagnosis not present

## 2017-01-20 DIAGNOSIS — E0843 Diabetes mellitus due to underlying condition with diabetic autonomic (poly)neuropathy: Secondary | ICD-10-CM

## 2017-01-20 NOTE — Progress Notes (Signed)
Complaint:  Visit Type: Patient returns to my office for continued preventative foot care services. Complaint: Patient states" my nails have grown long and thick and become painful to walk and wear shoes" Patient has been diagnosed with DM with neuropathy. The patient presents for preventative foot care services. No changes to ROS  Podiatric Exam: Vascular: dorsalis pedis and posterior tibial pulses are palpable bilateral. Capillary return is immediate. Temperature gradient is WNL. Skin turgor WNL  Sensorium: Normal Semmes Weinstein monofilament test. Normal tactile sensation bilaterally. Nail Exam: Pt has thick disfigured discolored nails with subungual debris noted bilateral entire nail hallux through fifth toenails Ulcer Exam: There is no evidence of ulcer or pre-ulcerative changes or infection. Orthopedic Exam: Muscle tone and strength are WNL. No limitations in general ROM. No crepitus or effusions noted. HAV  B/L. Skin: No Porokeratosis. No infection or ulcers  Diagnosis:  Onychomycosis, , Pain in right toe, pain in left toes  Treatment & Plan Procedures and Treatment: Consent by patient was obtained for treatment procedures. The patient understood the discussion of treatment and procedures well. All questions were answered thoroughly reviewed. Debridement of mycotic and hypertrophic toenails, 1 through 5 bilateral and clearing of subungual debris. No ulceration, no infection noted.  Return Visit-Office Procedure: Patient instructed to return to the office for a follow up visit 3 months for continued evaluation and treatment.    Helane GuntherGregory Ryder Chesmore DPM

## 2017-01-30 NOTE — Progress Notes (Signed)
Cardiology Office Note  Date:  01/31/2017   ID:  Destiny Savage, DOB [birth year], MRN [id number]  PCP:  Jaclyn Shaggy, MD   Chief Complaint  Patient presents with  . other    Pt. recently had a stroke; was at [facility]. Pt. c/o left arm pain that radiates to her fingertips.  Meds reviewed by the pt. verbally.     HPI:  Destiny Savage is a 66 y.o. 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 She is discharged without clear diagnosis  Denies having significant chest pain or shortness of breath Overall feels well No further TIA or stroke symptoms No leg swelling Blood pressure well controlled, tolerating simvastatin  Lab work reviewed with her in detail Total chol 129, LDL 68 HBA1C 6.7  EKG on today's visit shows normal sinus rhythm with rate 70 bpm, no significant ST or T-wave changes  Other past medical history reviewed lost her mother November 2017  Admission to [facility] 12/24/2015 with chest pain Negative cardiac enzymes, EKG relatively unchanged Echo showed EF 60-65%, no RWMA, GR1DD, PASP 47 mm Hg, and mild to moderate posterior pericardial effusion without evidence of hemodynamic compromise.  She remained chest pain free throughout her admission.   stress test 12/2015  no ischemia normal ejection fraction She denies any further episodes of chest pain  History of severe GI issues, chronic diarrhea Previously seen by GI at [facility]  admitted to [facility] in 11/2001 for chest pain Dr. Juliann Pares performed a cardiac cath that did not show any significant  coroanry disease.  She was again admitted to [facility] in 2004 for chest pain and had a nuclear stress test that did not show any ischemia.  normal echo.  She has had off-and-on admissions over the years for abdominal pain and C diff infections  PMH:   has a past medical history of Arthritis; Asthma; Diabetes mellitus; GERD (gastroesophageal reflux disease); Hyperlipidemia; Hypertension; Mini stroke (HCC); and Stroke (HCC).  PSH:    Past Surgical History:  Procedure Laterality Date  . ABDOMINAL HYSTERECTOMY    . CARDIAC CATHETERIZATION    . CHOLECYSTECTOMY      Current Outpatient Prescriptions  Medication Sig Dispense Refill  . aspirin 81 MG tablet Take 1 tablet (81 mg total) by mouth daily.    . carvedilol (COREG) 6.25 MG tablet Take 6.25 mg by mouth 2 (two) times daily with a meal.    . clopidogrel (PLAVIX) 75 MG tablet Take 75 mg by mouth daily.    Marland Kitchen docusate sodium (COLACE) 100 MG capsule Take 1 capsule (100 mg total) by mouth daily as needed. 30 capsule 2  . furosemide (LASIX) 20 MG tablet TAKE ONE TABLET BY MOUTH ONCE DAILY 30 tablet 6  . omeprazole (PRILOSEC) 20 MG capsule Take 20 mg by mouth daily.    . potassium chloride (K-DUR) 10 MEQ tablet Take 1 tablet (10 mEq total) by mouth daily. 90 tablet 3  . simvastatin (ZOCOR) 40 MG tablet Take 40 mg by mouth every evening.     No current facility-administered medications for this visit.      Allergies:  Latex and Lidocaine   Social History:  The patient  reports that she has never smoked. She has never used smokeless tobacco. She reports that she does not drink alcohol or use drugs.   Family History:   family history includes Heart Problems in her father.    Review of Systems: Review of Systems  Constitutional: Negative.   Respiratory: Negative.   Cardiovascular: Negative.   Gastrointestinal: Negative.   Musculoskeletal: Negative.   Neurological: Negative.   Psychiatric/Behavioral: Negative.   All other systems reviewed  and are negative.    PHYSICAL EXAM: VS:  BP 110/70 (BP Location: Left Arm, Patient Position: Sitting, Cuff Size: Normal)   Ht 5' 7.5" (1.715 m)   Wt 211 lb 12 oz (96 kg)   BMI 32.68 kg/m  , BMI Body mass index is 32.68 kg/m. GEN: Well nourished, well developed, in no acute distress  HEENT: normal  Neck: no JVD, carotid bruits, or masses Cardiac: RRR; no murmurs, rubs, or gallops,no edema  Respiratory:  clear to auscultation bilaterally, normal work of breathing GI: soft, nontender, nondistended, + BS MS: no deformity or atrophy  Skin: warm and dry, no rash Neuro:  Strength and sensation are intact Psych: euthymic mood, full affect    Recent Labs: 01/10/2017: ALT 11; BUN 12; Creatinine, Ser 0.77; Hemoglobin 12.4; Platelets 250; Potassium 3.7; Sodium 138    Lipid Panel Lab Results  Component Value Date   CHOL 129 01/11/2017   HDL 47 01/11/2017   LDLCALC 68 01/11/2017   TRIG 70 01/11/2017      Wt Readings from Last 3 Encounters:  01/31/17 211 lb 12 oz (96 kg)  01/10/17 214 lb (97.1 kg)  11/21/16 214 lb 12 oz (97.4 kg)       ASSESSMENT AND PLAN:  Essential hypertension - Plan: EKG 12-Lead Blood pressure is well controlled on today's visit. No changes made to the medications.  Mixed hyperlipidemia - Plan: EKG 12-Lead Cholesterol is at goal on the current lipid regimen. No changes to the medications were made.  Cerebrovascular accident (CVA), unspecified mechanism (HCC) - Plan: EKG 12-Lead Encouraged to stay on her aspirin Plavix given history of stroke  Controlled type 2 diabetes mellitus without complication, without long-term current use of insulin (HCC) - Plan: EKG 12-Lead Slow trend up in her hemoglobin A1c Recommended she avoid soda, she drinks 1 or 2 per day Long discussion concerning her diet   Total encounter time more than 25 minutes  Greater than 50% was spent in counseling and coordination of care with the patient   Disposition:   F/U  12  months   Orders Placed This Encounter  Procedures  . EKG 12-Lead     Signed, Dossie Arbourim Tauri Ethington, M.D., Ph.D. 01/31/2017  [facility], [place] [phone]

## 2017-01-31 ENCOUNTER — Ambulatory Visit (INDEPENDENT_AMBULATORY_CARE_PROVIDER_SITE_OTHER): Payer: Medicare Other | Admitting: Cardiovascular Disease

## 2017-01-31 ENCOUNTER — Encounter: Payer: Self-pay | Admitting: Cardiovascular Disease

## 2017-01-31 VITALS — BP 110/70 | Ht 67.5 in | Wt 211.8 lb

## 2017-01-31 DIAGNOSIS — E782 Mixed hyperlipidemia: Secondary | ICD-10-CM | POA: Diagnosis not present

## 2017-01-31 DIAGNOSIS — E119 Type 2 diabetes mellitus without complications: Secondary | ICD-10-CM

## 2017-01-31 DIAGNOSIS — I10 Essential (primary) hypertension: Secondary | ICD-10-CM

## 2017-01-31 DIAGNOSIS — I639 Cerebral infarction, unspecified: Secondary | ICD-10-CM

## 2017-01-31 NOTE — Patient Instructions (Signed)

## 2017-11-19 ENCOUNTER — Other Ambulatory Visit: Payer: Self-pay | Admitting: Internal Medicine

## 2017-11-19 DIAGNOSIS — Z1231 Encounter for screening mammogram for malignant neoplasm of breast: Secondary | ICD-10-CM

## 2017-11-24 ENCOUNTER — Emergency Department
Admission: EM | Admit: 2017-11-24 | Discharge: 2017-11-24 | Disposition: A | Discharge status: Home / Self Care | Payer: Medicare Other | Attending: Emergency Medicine | Admitting: Emergency Medicine

## 2017-11-24 ENCOUNTER — Emergency Department: Payer: Medicare Other

## 2017-11-24 ENCOUNTER — Encounter: Payer: Self-pay | Admitting: Emergency Medicine

## 2017-11-24 DIAGNOSIS — I10 Essential (primary) hypertension: Secondary | ICD-10-CM | POA: Diagnosis not present

## 2017-11-24 DIAGNOSIS — J45909 Unspecified asthma, uncomplicated: Secondary | ICD-10-CM | POA: Insufficient documentation

## 2017-11-24 DIAGNOSIS — E119 Type 2 diabetes mellitus without complications: Secondary | ICD-10-CM | POA: Insufficient documentation

## 2017-11-24 DIAGNOSIS — H538 Other visual disturbances: Secondary | ICD-10-CM | POA: Diagnosis not present

## 2017-11-24 DIAGNOSIS — H53149 Visual discomfort, unspecified: Secondary | ICD-10-CM | POA: Diagnosis not present

## 2017-11-24 DIAGNOSIS — R51 Headache: Secondary | ICD-10-CM | POA: Insufficient documentation

## 2017-11-24 DIAGNOSIS — Z8673 Personal history of transient ischemic attack (TIA), and cerebral infarction without residual deficits: Secondary | ICD-10-CM | POA: Insufficient documentation

## 2017-11-24 DIAGNOSIS — Z91040 Latex allergy status: Secondary | ICD-10-CM | POA: Diagnosis not present

## 2017-11-24 DIAGNOSIS — R519 Headache, unspecified: Secondary | ICD-10-CM

## 2017-11-24 LAB — CBC
HCT: 38 % (ref 35.0–47.0)
Hemoglobin: 12.8 g/dL (ref 12.0–16.0)
MCH: 30.2 pg (ref 26.0–34.0)
MCHC: 33.6 g/dL (ref 32.0–36.0)
MCV: 89.9 fL (ref 80.0–100.0)
Platelets: 247 10*3/uL (ref 150–440)
RBC: 4.22 MIL/uL (ref 3.80–5.20)
RDW: 13.5 % (ref 11.5–14.5)
WBC: 8.4 10*3/uL (ref 3.6–11.0)

## 2017-11-24 LAB — BASIC METABOLIC PANEL
Anion gap: 10 (ref 5–15)
BUN: 16 mg/dL (ref 6–20)
CALCIUM: 9.5 mg/dL (ref 8.9–10.3)
CO2: 28 mmol/L (ref 22–32)
CREATININE: 0.76 mg/dL (ref 0.44–1.00)
Chloride: 99 mmol/L — ABNORMAL LOW (ref 101–111)
GFR calc Af Amer: >60 mL/min (ref >60)
GLUCOSE: 121 mg/dL — AB (ref 65–99)
Potassium: 3.7 mmol/L (ref 3.5–5.1)
Sodium: 137 mmol/L (ref 135–145)

## 2017-11-24 LAB — SEDIMENTATION RATE: Sed Rate: 23 mm/hr (ref 0–30)

## 2017-11-24 MED ORDER — KETOROLAC TROMETHAMINE 10 MG PO TABS: 10.0000 mg | ORAL_TABLET | Freq: Three times a day (TID) | ORAL | 0 refills | Status: DC | PRN

## 2017-11-24 MED ORDER — HYDRALAZINE HCL 10 MG PO TABS
10.0000 mg | ORAL_TABLET | Freq: Once | ORAL | Status: AC
  Administered 2017-11-24: 10 mg via ORAL
  Filled 2017-11-24: qty 1

## 2017-11-24 MED ORDER — KETOROLAC TROMETHAMINE 60 MG/2ML IM SOLN
60.0000 mg | Freq: Once | INTRAMUSCULAR | Status: AC
  Administered 2017-11-24: 60 mg via INTRAMUSCULAR
  Filled 2017-11-24: qty 2

## 2017-11-24 NOTE — ED Provider Notes (Signed)
[facility] Emergency Department Provider Note  ____________________________________________  Time seen: Approximately 8:48 PM  I have reviewed the triage vital signs and the nursing notes.   HISTORY  Chief Complaint Headache    HPI Destiny Savage is a 67 y.o. female with a history of HTN, HL, CVA, presenting with headache.  The patient reports that for the past month, she has had a "bandlike" headache which starts the posterior lower scalp and wraps around to her forehead.  She has this headache daily, but usually treats it with "pain medication," which she cannot remember the name of.  Approximately 4 weeks ago, she had cyst removal in the posterior left scalp, and she feels like her headache has been worse since then.  She denies any fever, chills, nausea or vomiting, trauma, neck stiffness or pain.  She occasionally has some mild intermittent blurred vision bilaterally which resolved spontaneously.  She also occasionally has some mild phonophobia no associated numbness tingling or weakness, difficulty walking, or changes in her speech.  Past Medical History:  Diagnosis Date  . Arthritis   . Asthma   . Diabetes mellitus   . GERD (gastroesophageal reflux disease)   . Hyperlipidemia   . Hypertension   . Mini stroke (HCC)   . Stroke [facility])     Patient Active Problem List   Diagnosis Date Noted  . 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 Stroke (HCC) 01/10/2017  . Mixed hyperlipidemia 11/21/2016  . Neck pain 11/21/2016  . Chest pain 12/23/2015  . GERD (gastroesophageal reflux disease) 12/23/2015  . Abdominal pain 04/30/2013  . Vertigo 05/11/2012  . Hypertension 05/11/2012  . Diabetes type 2, controlled (HCC) 05/11/2012  . Neck pain on right side 05/11/2012    Past Surgical History:  Procedure Laterality Date  . ABDOMINAL HYSTERECTOMY    . CARDIAC CATHETERIZATION    . CHOLECYSTECTOMY      Current Outpatient Rx  . Order #:  [id number] Class: No Print  . Order #: [id number] Class: Historical Med  . Order #: [id number] Class: Historical Med  . Order #: [id number] Class: Normal  . Order #: [id number] Class: Print  . Order #: [id number] Class: Historical Med  . Order #: [id number] Class: Historical Med  . Order #: [id number] Class: Historical Med    Allergies Latex and Lidocaine  Family History  Problem Relation Age of Onset  . Heart Problems Father   . Hypertension Unknown   . CAD Unknown     Social History Social History   Tobacco Use  . Smoking status: Never Smoker  . Smokeless tobacco: Never Used  Substance Use Topics  . Alcohol use: No  . Drug use: No    Review of Systems Constitutional: No fever/chills.  No trauma.  No lightheadedness or syncope. Eyes: Remittent blurred vision. ENT: No sore throat. No congestion or rhinorrhea. Cardiovascular: Denies chest pain. Denies palpitations. Respiratory: Denies shortness of breath.  No cough. Gastrointestinal: No abdominal pain.  No nausea, no vomiting.  No diarrhea.  No constipation. Genitourinary: Negative for dysuria. Musculoskeletal: Negative for back pain.  Negative for neck pain or stiffness  skin: Negative for rash. Neurological: Positive for headaches. No focal numbness, tingling or weakness.  Positive phonophobia.    ____________________________________________   PHYSICAL EXAM:  VITAL SIGNS: ED Triage Vitals [11/24/17 1905]  Enc Vitals Group     BP (!) 188/92     Pulse Rate 73     Resp 17     Temp 98.3 F (36.8 C)  Temp Source Oral     SpO2 99 %     Weight 211 lb (95.7 kg)     Height      Head Circumference      Peak Flow      Pain Score      Pain Loc      Pain Edu?      Excl. in GC?     Constitutional: Alert and oriented.  Chronically ill appearing but in no acute distress. Answers questions appropriately. Eyes: Conjunctivae are normal.  EOMI. PERRLA.  No vertical or horizontal nystagmus peer no scleral icterus. Head: Atraumatic.   Positive mild tenderness to palpation in the bilateral temples.  At the base of the left posterior scalp, the patient has a well-healed incision that is approximately 1 cm wide and without any surrounding erythema, swelling, or discharge. Nose: No congestion/rhinnorhea. Mouth/Throat: Mucous membranes are moist.  Neck: No stridor.  Supple.  No JVD.  No meningismus. Cardiovascular: Normal rate, regular rhythm. No murmurs, rubs or gallops.  Respiratory: Normal respiratory effort.  No accessory muscle use or retractions. Lungs CTAB.  No wheezes, rales or ronchi. Musculoskeletal: No LE edema. No ttp in the calves or palpable cords.  Negative Homan's sign. Neurologic: Alert and oriented 3. Speech is clear. Naming and repetition are intact. Face and smile symmetric. Tongue is midline. Normal cheek puff. No pronator drift. 5 out of 5 grip, biceps, triceps, hip flexors, plantar flexion and dorsiflexion. Normal sensation to light touch in the bilateral upper and lower extremities, and face. Normal heel-to-shin. Skin:  Skin is warm, dry. Psychiatric: It is depressed and affect is flat. Speech and behavior are normal.  Normal judgement.  ____________________________________________   LABS (all labs ordered are listed, but only abnormal results are displayed)  Labs Reviewed  BASIC METABOLIC PANEL - Abnormal; Notable for the following components:      Result Value   Chloride 99 (*)    Glucose, Bld 121 (*)    All other components within normal limits  CBC  SEDIMENTATION RATE  CBG MONITORING, ED   ____________________________________________  EKG  Not indicated ____________________________________________  RADIOLOGY  Ct Head Wo Contrast  Result Date: 11/24/2017 CLINICAL DATA:  Increasing headache blurred vision. EXAM: CT HEAD WITHOUT CONTRAST TECHNIQUE: Contiguous axial images were obtained from the base of the skull through the vertex without intravenous contrast. COMPARISON:  Brain MRI  01/10/2017 FINDINGS: Brain: No evidence of acute infarction, hemorrhage, hydrocephalus, extra-axial collection or mass lesion/mass effect. Vascular: No hyperdense vessel or unexpected calcification. Skull: Normal. Negative for fracture or focal lesion. Sinuses/Orbits: No acute finding. Other: None. IMPRESSION: No acute intracranial abnormality. Electronically Signed   By: Ted Mcalpineobrinka  Dimitrova M.D.   On: 11/24/2017 19:33    ____________________________________________   PROCEDURES  Procedure(s) performed: None  Procedures  Critical Care performed: No ____________________________________________   INITIAL IMPRESSION / ASSESSMENT AND PLAN / ED COURSE  Pertinent labs & imaging results that were available during my care of the patient were reviewed by me and considered in my medical decision making (see chart for details).  67 y.o. female resenting with daily headaches for greater than 1 month, with occasional blurred vision.  Overall, the patient does have hypertension today, which may contribute to her headache and I will plan to have her regularly check her blood pressures at home.  Her CT scan does not show any acute intracranial process, and an acute emergency pathology including intracranial hemorrhage, mass, or meningitis are very unlikely.  The patient  has no physical exam findings that would be consistent with acute CVA.  She does have some tenderness over the bilateral temples and with intermittent blurred vision, will get a sed rate and consider temporal arteritis.  The patient's laboratory studies are reassuring plan reevaluation for final disposition.  I have reviewed the patient's medical chart.  ----------------------------------------- 10:19 PM on 11/24/2017 -----------------------------------------  At this time, the patient states her headache has resolved after Toradol.  Her workup in the emergency department has been reassuring with a negative CT of the head, reassuring  blood work, including a normal sed rate.  I will plan to discharge her home with oral Toradol and have given her precautions about not taking any other NSAID medications.  She understands return precautions as well as follow-up instructions. ____________________________________________  FINAL CLINICAL IMPRESSION(S) / ED DIAGNOSES  Final diagnoses:  Acute nonintractable headache, unspecified headache type  Blurred vision, left eye         NEW MEDICATIONS STARTED DURING THIS VISIT:  This SmartLink is deprecated. Use AVSMEDLIST instead to display the medication list for a patient.    Rockne Menghini, MD 11/24/17 2221

## 2017-11-24 NOTE — ED Triage Notes (Signed)
Pt reports she had a cyst removed from the left posterior part of head x1 month ago and since has had increase in HA. Per pt, the HA occurred before the cyst was removed but have increased in pain since removal. Pt to ED today due to increase in pain and blurred vision.

## 2017-11-24 NOTE — Discharge Instructions (Signed)
Please drink plenty of fluid and eat small regular healthy meals throughout the day.  Please get plenty of rest.  You may take Tylenol or Toradol for headache.  Do not take any other NSAID medications including ibuprofen, Advil, Motrin, or Aleve if you are taking Toradol.  Return to the emergency department if you develop severe pain, lightheadedness or fainting, new numbness tingling or weakness, changes in vision or speech, changes in mental status, fever, or any other symptoms concerning to you.

## 2017-11-28 ENCOUNTER — Other Ambulatory Visit: Payer: Self-pay | Admitting: Physician Assistant

## 2017-12-04 ENCOUNTER — Ambulatory Visit
Admission: RE | Admit: 2017-12-04 | Discharge: 2017-12-04 | Disposition: A | Discharge status: Home / Self Care | Payer: Medicare Other | Source: Ambulatory Visit | Attending: Internal Medicine | Admitting: Internal Medicine

## 2017-12-04 DIAGNOSIS — Z1231 Encounter for screening mammogram for malignant neoplasm of breast: Secondary | ICD-10-CM

## 2017-12-15 ENCOUNTER — Other Ambulatory Visit: Payer: Self-pay

## 2017-12-15 ENCOUNTER — Encounter: Payer: Self-pay | Admitting: Emergency Medicine

## 2017-12-15 DIAGNOSIS — Z91040 Latex allergy status: Secondary | ICD-10-CM | POA: Diagnosis not present

## 2017-12-15 DIAGNOSIS — R51 Headache: Secondary | ICD-10-CM | POA: Insufficient documentation

## 2017-12-15 DIAGNOSIS — Z79899 Other long term (current) drug therapy: Secondary | ICD-10-CM | POA: Diagnosis not present

## 2017-12-15 DIAGNOSIS — Z7902 Long term (current) use of antithrombotics/antiplatelets: Secondary | ICD-10-CM | POA: Diagnosis not present

## 2017-12-15 DIAGNOSIS — Z7982 Long term (current) use of aspirin: Secondary | ICD-10-CM | POA: Diagnosis not present

## 2017-12-15 DIAGNOSIS — E119 Type 2 diabetes mellitus without complications: Secondary | ICD-10-CM | POA: Insufficient documentation

## 2017-12-15 DIAGNOSIS — J45909 Unspecified asthma, uncomplicated: Secondary | ICD-10-CM | POA: Insufficient documentation

## 2017-12-15 DIAGNOSIS — I10 Essential (primary) hypertension: Secondary | ICD-10-CM | POA: Insufficient documentation

## 2017-12-15 NOTE — ED Triage Notes (Signed)
Pt presents to ED with headache since around 1800 tonight. Pt states she was seen in this ED with the same symptoms earlier this month and CT was negative at that time. Pt was then discharged home with pain medication. Pt states she stopped taking the pain medication because they "didn't make her feel right at all". Pt states her headache feels like a tight band that goes from behind her eyes to her ears and around her head. Pt reports her ears are also painful. Pt currently has clear speech with no obvious neuro deficits. Denies vomiting and reports "a little" nausea.

## 2017-12-16 ENCOUNTER — Emergency Department: Payer: Medicare Other

## 2017-12-16 ENCOUNTER — Emergency Department
Admission: EM | Admit: 2017-12-16 | Discharge: 2017-12-16 | Disposition: A | Discharge status: Home / Self Care | Payer: Medicare Other | Attending: Emergency Medicine | Admitting: Emergency Medicine

## 2017-12-16 DIAGNOSIS — R519 Headache, unspecified: Secondary | ICD-10-CM

## 2017-12-16 DIAGNOSIS — R51 Headache: Secondary | ICD-10-CM

## 2017-12-16 MED ORDER — METOCLOPRAMIDE HCL 5 MG/ML IJ SOLN
10.0000 mg | Freq: Once | INTRAMUSCULAR | Status: AC
  Administered 2017-12-16: 10 mg via INTRAVENOUS
  Filled 2017-12-16: qty 2

## 2017-12-16 MED ORDER — BUTALBITAL-APAP-CAFFEINE 50-325-40 MG PO TABS: 1.0000 | ORAL_TABLET | Freq: Four times a day (QID) | ORAL | 0 refills | Status: DC | PRN

## 2017-12-16 MED ORDER — LORAZEPAM 2 MG/ML IJ SOLN
2.0000 mg | Freq: Once | INTRAMUSCULAR | Status: AC
  Administered 2017-12-16: 2 mg via INTRAVENOUS

## 2017-12-16 MED ORDER — DIPHENHYDRAMINE HCL 50 MG/ML IJ SOLN
25.0000 mg | Freq: Once | INTRAMUSCULAR | Status: AC
  Administered 2017-12-16: 25 mg via INTRAVENOUS
  Filled 2017-12-16: qty 1

## 2017-12-16 MED ORDER — SODIUM CHLORIDE 0.9 % IV BOLUS (SEPSIS)
500.0000 mL | Freq: Once | INTRAVENOUS | Status: AC
  Administered 2017-12-16: 500 mL via INTRAVENOUS

## 2017-12-16 MED ORDER — KETOROLAC TROMETHAMINE 30 MG/ML IJ SOLN
30.0000 mg | Freq: Once | INTRAMUSCULAR | Status: AC
  Administered 2017-12-16: 30 mg via INTRAVENOUS
  Filled 2017-12-16: qty 1

## 2017-12-16 MED ORDER — LORAZEPAM 2 MG/ML IJ SOLN
INTRAMUSCULAR | Status: AC
  Filled 2017-12-16: qty 1

## 2017-12-16 NOTE — ED Notes (Signed)
Patient is resting comfortably. 

## 2017-12-16 NOTE — ED Notes (Signed)
Pt complaint discussed with Dr. Zenda AlpersWebster. No protocols ordered at this time.

## 2017-12-16 NOTE — ED Provider Notes (Signed)
[facility] Emergency Department Provider Note   ____________________________________________   First MD Initiated Contact with Patient 12/16/17 [phone]     (approximate)  I have reviewed the triage vital signs and the nursing notes.   HISTORY  Chief Complaint Headache    HPI Destiny Savage is a 67 y.o. female who comes into the hospital today with a headache.  The patient reports that it is really bad.  It starts in her eyes and then goes around her head.  She was here a few weeks ago and evaluated for this headache.  She was discharged and followed up with her primary care physician but they have not referred her to neurology.  The patient reports that the headache is still coming and going.  She was given some medicine for her headache but did not like how it made her feel so she stopped taking it.  The patient states that it did go away slowly but it did also come back.  She just does not feel right in her head.  She feels as though there is a band around her head.  She has been taking Tylenol extra strength and it helps a little but not enough.  The patient rates her pain a 10 out of 10 in intensity currently.  She had some nausea and vomiting.  She states that she is sensitive to light and sound with some blurred vision and some dizziness.  The patient is here today for evaluation and treatment of her headache.  The patient is also asking for referral to neurology.  Past Medical History:  Diagnosis Date  . Arthritis   . Asthma   . Diabetes mellitus   . GERD (gastroesophageal reflux disease)   . Hyperlipidemia   . Hypertension   . Mini stroke (HCC)   . Stroke [facility](HCC)     Patient Active Problem List   Diagnosis Date Noted  . 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 Stroke (HCC) 01/10/2017  . Mixed hyperlipidemia 11/21/2016  . Neck pain 11/21/2016  . Chest pain 12/23/2015  . GERD (gastroesophageal reflux disease) 12/23/2015  . Abdominal  pain 04/30/2013  . Vertigo 05/11/2012  . Hypertension 05/11/2012  . Diabetes type 2, controlled (HCC) 05/11/2012  . Neck pain on right side 05/11/2012    Past Surgical History:  Procedure Laterality Date  . ABDOMINAL HYSTERECTOMY    . BREAST EXCISIONAL BIOPSY Left 1980's   benign  . CARDIAC CATHETERIZATION    . CHOLECYSTECTOMY      Prior to Admission medications   Medication Sig Start Date End Date Taking? Authorizing Provider  aspirin 81 MG tablet Take 1 tablet (81 mg total) by mouth daily. 01/11/17   Milagros LollSudini, Srikar, MD  butalbital-acetaminophen-caffeine (FIORICET, ESGIC) [phone] 583950-325-40 MG tablet Take 1-2 tablets by mouth every 6 (six) hours as needed for headache. 12/16/17 12/16/18  Rebecka ApleyWebster, Gee Habig P, MD  carvedilol (COREG) 6.25 MG tablet Take 6.25 mg by mouth 2 (two) times daily with a meal.    [provider]  clopidogrel (PLAVIX) 75 MG tablet Take 75 mg by mouth daily.    [provider]  furosemide (LASIX) 20 MG tablet TAKE ONE TABLET BY MOUTH ONCE DAILY 12/01/17   Antonieta IbaGollan, Timothy J, MD  ketorolac (TORADOL) 10 MG tablet Take 1 tablet (10 mg total) by mouth every 8 (eight) hours as needed for moderate pain (with food). 11/24/17   Rockne MenghiniNorman, Anne-Caroline, MD  omeprazole (PRILOSEC) 20 MG capsule Take 20 mg by mouth  daily.    [provider]  potassium chloride (K-DUR) 10 MEQ tablet Take 1 tablet (10 mEq total) by mouth daily. 11/21/16   Antonieta Iba, MD  simvastatin (ZOCOR) 40 MG tablet Take 40 mg by mouth every evening.    [provider]    Allergies Latex and Lidocaine  Family History  Problem Relation Age of Onset  . Heart Problems Father   . Hypertension Unknown   . CAD Unknown     Social History Social History   Tobacco Use  . Smoking status: Never Smoker  . Smokeless tobacco: Never Used  Substance Use Topics  . Alcohol use: No  . Drug use: No    Review of Systems  Constitutional: No fever/chills Eyes: Blurred vision ENT: No  sore throat. Cardiovascular: Denies chest pain. Respiratory: Denies shortness of breath. Gastrointestinal: No abdominal pain.  No nausea, no vomiting.  No diarrhea.  No constipation. Genitourinary: Negative for dysuria. Musculoskeletal: Negative for back pain. Skin: Negative for rash. Neurological: Headache, dizziness   ____________________________________________   PHYSICAL EXAM:  VITAL SIGNS: ED Triage Vitals  Enc Vitals Group     BP 12/15/17 2344 (!) 165/76     Pulse Rate 12/15/17 2344 67     Resp 12/15/17 2344 18     Temp 12/15/17 2344 98.8 F (37.1 C)     Temp Source 12/15/17 2344 Oral     SpO2 12/15/17 2344 98 %     Weight 12/15/17 2344 220 lb (99.8 kg)     Height 12/15/17 2344 5\' 7"  (1.702 m)     Head Circumference --      Peak Flow --      Pain Score 12/15/17 2355 10     Pain Loc --      Pain Edu? --      Excl. in GC? --     Constitutional: Alert and oriented. Well appearing and in moderate distress. Eyes: Conjunctivae are normal. PERRL. EOMI. Head: Atraumatic. Nose: No congestion/rhinnorhea. Mouth/Throat: Mucous membranes are moist.  Oropharynx non-erythematous. Cardiovascular: Normal rate, regular rhythm. Grossly normal heart sounds.  Good peripheral circulation. Respiratory: Normal respiratory effort.  No retractions. Lungs CTAB. Gastrointestinal: Soft and nontender. No distention.  Positive bowel sounds Musculoskeletal: No lower extremity tenderness nor edema.   Neurologic:  Normal speech and language.  Cranial nerves II through XII are grossly intact with no focal motor neuro deficit Skin:  Skin is warm, dry and intact.  Psychiatric: Mood and affect are normal. Speech and behavior are normal.  ____________________________________________   LABS (all labs ordered are listed, but only abnormal results are displayed)  Labs Reviewed - No data to  display ____________________________________________  EKG  none ____________________________________________  RADIOLOGY  MRI: Normal Noncon MRI of the head.  ____________________________________________   PROCEDURES  Procedure(s) performed: None  Procedures  Critical Care performed: No  ____________________________________________   INITIAL IMPRESSION / ASSESSMENT AND PLAN / ED COURSE  As part of my medical decision making, I reviewed the following data within the electronic MEDICAL RECORD NUMBER Notes from prior ED visits and [place] Controlled Substance Database   This is a 67 year old who comes into the hospital today with some headaches.  The patient has been having these headaches for approximately 2 months.  She has had some blurred vision with it in the past.  She also states she has had some vomiting with it as well.  When the patient was seen previously she had some blood work and a CT  scan which was unremarkable.  I did give the patient some Reglan, Benadryl, normal saline and Toradol.  I then sent the patient for an MRI since she did have some dizziness.  Patient's MRI is unremarkable.  After the medication the patient was sleeping comfortably but her headache improved.  I did encourage the patient to follow-up with neurology and her primary care physician.  The patient will be discharged home to follow-up.      ____________________________________________   FINAL CLINICAL IMPRESSION(S) / ED DIAGNOSES  Final diagnoses:  Acute nonintractable headache, unspecified headache type     ED Discharge Orders        Ordered    butalbital-acetaminophen-caffeine (FIORICET, ESGIC) 50-325-40 MG tablet  Every 6 hours PRN     12/16/17 0605       Note:  This document was prepared using Dragon voice recognition software and may include unintentional dictation errors.    Rebecka Apley, MD 12/16/17 [phone]

## 2017-12-16 NOTE — ED Notes (Signed)
Pt to the er for headache that began at 1800. Pt took 2 extra strength tylenol with no relief. Pain is not radiating into the neck or shoulders. Pt reports pain behind the yes. Denies sinus drainage or cough.  159/79

## 2017-12-16 NOTE — ED Notes (Signed)
Family at bedside. 

## 2017-12-16 NOTE — Discharge Instructions (Signed)
Please follow up with your primary care physician as well as neurology for further evaluation of your headache.

## 2018-01-31 NOTE — Progress Notes (Signed)
Cardiology Office Note  Date:  02/02/2018   ID:  PHOENIX RIESEN, DOB [birth year], MRN [id number]  PCP:  Jaclyn Shaggy, MD   Chief Complaint  Patient presents with  . other    12 month follow up. Meds reviewed by the pt. verbally. Pt. c/o headaches.     HPI:  Destiny Savage is a 67 y.o. female with history of  TIA/stroke,  DM2,  HTN, HLD,  long history of nausea/vomiting/diarrhea without etiology with previous symptoms of chest discomfort,  Last stress test February 2017 showing no ischemia OSA, Not using her CPAP who presents for routine follow-up of her chest pain symptoms  On her visit today she reports having frequent headaches In the ER 12/16/17 and 11/24/2017  for H/A Hospital records reviewed with the patient in detail MRI normal CT head ok Was given a prescription for Fioricet, she has not tried this yet  Stress at home Husband with cancer, CLL? 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  She is discharged without clear diagnosis  Denies having significant chest pain or shortness of breath Overall feels well No further TIA or stroke symptoms No leg swelling Blood pressure well controlled, tolerating simvastatin  Lab work reviewed with her in detail Total chol 129, LDL 68 HBA1C 6.7  EKG personally reviewed by myself on todays visit Shows normal sinus rhythm rate 64 bpm no significant ST or T wave changes  Other past medical history reviewed lost her mother November  2017  Admission to [facility] 12/24/2015 with chest pain Negative cardiac enzymes, EKG relatively unchanged Echo showed EF 60-65%, no RWMA, GR1DD, PASP 47 mm Hg, and mild to moderate posterior pericardial effusion without evidence of hemodynamic compromise.  She remained chest pain free throughout her admission.   stress test 12/2015  no ischemia normal ejection fraction She denies any further episodes of chest pain  History of severe GI issues, chronic diarrhea Previously seen by GI at [facility]  admitted to [facility] in 11/2001 for chest pain Dr. Juliann Pares performed a cardiac cath that did not show any significant coroanry disease.  She was again admitted to [facility] in 2004 for chest pain and had a nuclear stress test that did not show any ischemia.  normal echo.  She has had off-and-on admissions over the years for abdominal pain and C diff infections  PMH:   has a past medical history of Arthritis, Asthma, Diabetes mellitus, GERD (gastroesophageal reflux disease), Hyperlipidemia, Hypertension, Mini stroke (HCC), and Stroke (HCC).  PSH:    Past Surgical History:  Procedure Laterality Date  . ABDOMINAL HYSTERECTOMY    . BREAST EXCISIONAL BIOPSY Left 1980's   benign  . CARDIAC CATHETERIZATION    . CHOLECYSTECTOMY      Current Outpatient Medications  Medication Sig Dispense Refill  . aspirin 81 MG tablet Take 1 tablet (81 mg total) by mouth daily.    . carvedilol (COREG) 6.25 MG tablet Take 6.25 mg by mouth 2 (two) times daily with  a meal.    . clopidogrel (PLAVIX) 75 MG tablet Take 75 mg by mouth daily.    . furosemide (LASIX) 20 MG tablet TAKE ONE TABLET BY MOUTH ONCE DAILY 90 tablet 2  . ketorolac (TORADOL) 10 MG tablet Take 1 tablet (10 mg total) by mouth every 8 (eight) hours as needed for moderate pain (with food). 15 tablet 0  . omeprazole (PRILOSEC) 20 MG capsule Take 20 mg by mouth daily.    . potassium chloride (K-DUR) 10 MEQ tablet Take 1 tablet (10 mEq total) by mouth daily. 90  tablet 3  . simvastatin (ZOCOR) 40 MG tablet Take 40 mg by mouth every evening.     No current facility-administered medications for this visit.      Allergies:   Latex; Lidocaine; and Pb-hyoscy-atropine-scopol er   Social History:  The patient  reports that  has never smoked. she has never used smokeless tobacco. She reports that she does not drink alcohol or use drugs.   Family History:   family history includes CAD in her unknown relative; Heart Problems in her father; Hypertension in her unknown relative.    Review of Systems: Review of Systems  Constitutional: Positive for malaise/fatigue.  Respiratory: Negative.   Cardiovascular: Negative.   Gastrointestinal: Negative.   Musculoskeletal: Negative.   Neurological: Positive for headaches.  Psychiatric/Behavioral: Negative.   All other systems reviewed and are negative.    PHYSICAL EXAM: VS:  BP 140/80 (BP Location: Left Arm, Patient Position: Sitting, Cuff Size: Normal)   Pulse 64   Ht 5\' 6"  (1.676 m)   Wt 218 lb 8 oz (99.1 kg)   BMI 35.27 kg/m  , BMI Body mass index is 35.27 kg/m. Constitutional:  oriented to person, place, and time. No distress.  HENT:  Head: Normocephalic and atraumatic.  Eyes:  no discharge. No scleral icterus.  Neck: Normal range of motion. Neck supple. No JVD present.  Cardiovascular: Normal rate, regular rhythm, normal heart sounds and intact distal pulses. Exam reveals no gallop and no friction rub. No edema No murmur heard. Pulmonary/Chest: Effort normal and breath sounds normal. No stridor. No respiratory distress.  no wheezes.  no rales.  no tenderness.  Abdominal: Soft.  no distension.  no tenderness.  Musculoskeletal: Normal range of motion.  no  tenderness or deformity.  Neurological:  normal muscle tone. Coordination normal. No atrophy Skin: Skin is warm and dry. No rash noted. not diaphoretic.  Psychiatric:  normal mood and affect. behavior is normal. Thought content normal.     Recent Labs: 11/24/2017: BUN 16; Creatinine, Ser 0.76; Hemoglobin 12.8; Platelets 247; Potassium 3.7; Sodium 137    Lipid Panel Lab Results  Component Value Date   CHOL 129 01/11/2017   HDL 47 01/11/2017   LDLCALC 68 01/11/2017   TRIG 70 01/11/2017      Wt Readings from Last 3 Encounters:  02/02/18 218 lb 8 oz (99.1 kg)  12/15/17 220 lb (99.8 kg)  11/24/17 211 lb (95.7 kg)       ASSESSMENT AND PLAN:  Essential hypertension - Plan: EKG 12-Lead Blood pressure is well controlled on today's visit. No changes made to the medications.  Mixed hyperlipidemia - Plan: EKG 12-Lead Cholesterol is at goal on the current lipid regimen. No changes to the medications were made. Stable  Cerebrovascular accident (CVA), unspecified mechanism (HCC) - Plan: EKG 12-Lead Long history of headaches Recent MRI with no clear finding of stroke OSA, not on CPAP may be contributing to her  sleep disorder and fatigue with headaches  Controlled type 2 diabetes mellitus without complication, without long-term current use of insulin (HCC) - Plan: EKG 12-Lead We have encouraged continued exercise, careful diet management in an effort to lose weight.    Total encounter time more than 25 minutes  Greater than 50% was spent in counseling and coordination of care with the patient   Disposition:   F/U  12 months as needed   Orders Placed This Encounter  Procedures  . EKG 12-Lead     Signed, Dossie Arbourim Cissy Galbreath, M.D., Ph.D. 02/02/2018  [facility], [place] [phone]

## 2018-02-02 ENCOUNTER — Encounter: Payer: Self-pay | Admitting: Cardiovascular Disease

## 2018-02-02 ENCOUNTER — Ambulatory Visit (INDEPENDENT_AMBULATORY_CARE_PROVIDER_SITE_OTHER): Payer: Medicare Other | Admitting: Cardiovascular Disease

## 2018-02-02 VITALS — BP 140/80 | HR 64 | Ht 66.0 in | Wt 218.5 lb

## 2018-02-02 DIAGNOSIS — I10 Essential (primary) hypertension: Secondary | ICD-10-CM | POA: Diagnosis not present

## 2018-02-02 DIAGNOSIS — E782 Mixed hyperlipidemia: Secondary | ICD-10-CM | POA: Diagnosis not present

## 2018-02-02 DIAGNOSIS — I639 Cerebral infarction, unspecified: Secondary | ICD-10-CM | POA: Diagnosis not present

## 2018-02-02 DIAGNOSIS — E119 Type 2 diabetes mellitus without complications: Secondary | ICD-10-CM

## 2018-02-02 DIAGNOSIS — R079 Chest pain, unspecified: Secondary | ICD-10-CM | POA: Diagnosis not present

## 2018-02-02 NOTE — Patient Instructions (Signed)

## 2018-05-31 ENCOUNTER — Other Ambulatory Visit: Payer: Self-pay

## 2018-05-31 DIAGNOSIS — H538 Other visual disturbances: Secondary | ICD-10-CM | POA: Insufficient documentation

## 2018-05-31 DIAGNOSIS — H53149 Visual discomfort, unspecified: Secondary | ICD-10-CM | POA: Insufficient documentation

## 2018-05-31 DIAGNOSIS — Z91040 Latex allergy status: Secondary | ICD-10-CM | POA: Insufficient documentation

## 2018-05-31 DIAGNOSIS — E119 Type 2 diabetes mellitus without complications: Secondary | ICD-10-CM | POA: Diagnosis not present

## 2018-05-31 DIAGNOSIS — I10 Essential (primary) hypertension: Secondary | ICD-10-CM | POA: Diagnosis not present

## 2018-05-31 DIAGNOSIS — R110 Nausea: Secondary | ICD-10-CM | POA: Insufficient documentation

## 2018-05-31 DIAGNOSIS — Z79899 Other long term (current) drug therapy: Secondary | ICD-10-CM | POA: Insufficient documentation

## 2018-05-31 DIAGNOSIS — R51 Headache: Secondary | ICD-10-CM | POA: Diagnosis present

## 2018-05-31 DIAGNOSIS — Z8673 Personal history of transient ischemic attack (TIA), and cerebral infarction without residual deficits: Secondary | ICD-10-CM | POA: Diagnosis not present

## 2018-05-31 DIAGNOSIS — H833X9 Noise effects on inner ear, unspecified ear: Secondary | ICD-10-CM | POA: Insufficient documentation

## 2018-05-31 DIAGNOSIS — M542 Cervicalgia: Secondary | ICD-10-CM | POA: Diagnosis not present

## 2018-05-31 DIAGNOSIS — Z7982 Long term (current) use of aspirin: Secondary | ICD-10-CM | POA: Insufficient documentation

## 2018-05-31 DIAGNOSIS — Z7902 Long term (current) use of antithrombotics/antiplatelets: Secondary | ICD-10-CM | POA: Diagnosis not present

## 2018-05-31 DIAGNOSIS — G43809 Other migraine, not intractable, without status migrainosus: Secondary | ICD-10-CM | POA: Diagnosis not present

## 2018-05-31 DIAGNOSIS — J45909 Unspecified asthma, uncomplicated: Secondary | ICD-10-CM | POA: Insufficient documentation

## 2018-05-31 NOTE — ED Triage Notes (Signed)
Patient reports migraine for several weeks, states feels like usual migraines, just unable to get rid of it.

## 2018-06-01 ENCOUNTER — Emergency Department
Admission: EM | Admit: 2018-06-01 | Discharge: 2018-06-01 | Disposition: A | Discharge status: Home / Self Care | Payer: Medicare Other | Attending: Emergency Medicine | Admitting: Emergency Medicine

## 2018-06-01 DIAGNOSIS — G43809 Other migraine, not intractable, without status migrainosus: Secondary | ICD-10-CM

## 2018-06-01 MED ORDER — PREDNISONE 10 MG (21) PO TBPK: ORAL_TABLET | ORAL | 0 refills | Status: DC

## 2018-06-01 MED ORDER — METOCLOPRAMIDE HCL 5 MG/ML IJ SOLN
10.0000 mg | Freq: Once | INTRAMUSCULAR | Status: AC
  Administered 2018-06-01: 10 mg via INTRAVENOUS
  Filled 2018-06-01: qty 2

## 2018-06-01 MED ORDER — SODIUM CHLORIDE 0.9 % IV BOLUS
1000.0000 mL | Freq: Once | INTRAVENOUS | Status: AC
  Administered 2018-06-01: 1000 mL via INTRAVENOUS

## 2018-06-01 MED ORDER — KETOROLAC TROMETHAMINE 30 MG/ML IJ SOLN
30.0000 mg | Freq: Once | INTRAMUSCULAR | Status: AC
  Administered 2018-06-01: 30 mg via INTRAVENOUS
  Filled 2018-06-01: qty 1

## 2018-06-01 MED ORDER — BUTALBITAL-APAP-CAFFEINE 50-325-40 MG PO TABS: 1.0000 | ORAL_TABLET | Freq: Four times a day (QID) | ORAL | 0 refills | Status: AC | PRN

## 2018-06-01 MED ORDER — DIPHENHYDRAMINE HCL 50 MG/ML IJ SOLN
25.0000 mg | Freq: Once | INTRAMUSCULAR | Status: AC
  Administered 2018-06-01: 25 mg via INTRAVENOUS
  Filled 2018-06-01: qty 1

## 2018-06-01 NOTE — ED Notes (Signed)
Pt reports HA for the last 2 days, pt with husband tearfully relates losing both sister and brother with the last week (brother was in room next door and sister to aneurysm) then other sister (whose incapable of taking care of herself) ran off and won't communicate with pt, pt denies hx of stress induced anxiety, reports hx of TIA   Pt appears tearful, tremulous and voice shaky, denies sinus problems, reports extra strength tylenol not effective  Dr Zenda AlpersWebster notified of family stress per pt's husband

## 2018-06-01 NOTE — Discharge Instructions (Addendum)
Please follow-up with the acute care clinic as well as [facility].  Please take your medicines as prescribed.  Please return with any worsening symptoms or any other concerns.

## 2018-06-01 NOTE — ED Provider Notes (Signed)
[facility] Emergency Department Provider Note   ____________________________________________   First MD Initiated Contact with Patient 06/01/18 0240     (approximate)  I have reviewed the triage vital signs and the nursing notes.   HISTORY  Chief Complaint Migraine    HPI Destiny Savage is a 67 y.o. female who comes into the hospital today with a migraine.  The patient has a history of migraines.  She states that this 1 started yesterday but she is had it every day for the past week.  She has been taking Tylenol for her symptoms but has not been helping.  She has had her migraines checked out in the past but wanted to get it checked out again.  The patient states that the headache feels typical of her previous migraines but she rates her pain a 10 out of 10 in intensity.  She is sensitive to light and sound and she has been nauseous.  She has some mild left-sided neck pain and some blurred vision.  She is here today for treatment and evaluation of her headache.  Past Medical History:  Diagnosis Date  . Arthritis   . Asthma   . Diabetes mellitus   . GERD (gastroesophageal reflux disease)   . Hyperlipidemia   . Hypertension   . Mini stroke (HCC)   . Stroke [facility](HCC)     Patient Active Problem List   Diagnosis Date Noted  . 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 Stroke (HCC) 01/10/2017  . Mixed hyperlipidemia 11/21/2016  . Neck pain 11/21/2016  . Chest pain 12/23/2015  . GERD (gastroesophageal reflux disease) 12/23/2015  . Abdominal pain 04/30/2013  . Vertigo 05/11/2012  . Hypertension 05/11/2012  . Diabetes type 2, controlled (HCC) 05/11/2012  . Neck pain on right side 05/11/2012    Past Surgical History:  Procedure Laterality Date  . ABDOMINAL HYSTERECTOMY    . BREAST EXCISIONAL BIOPSY Left 1980's   benign  . CARDIAC CATHETERIZATION    . CHOLECYSTECTOMY      Prior to Admission medications   Medication Sig Start Date End  Date Taking? Authorizing Provider  aspirin 81 MG tablet Take 1 tablet (81 mg total) by mouth daily. 01/11/17   Milagros LollSudini, Srikar, MD  butalbital-acetaminophen-caffeine (FIORICET, ESGIC) [phone] MG tablet Take 1-2 tablets by mouth every 6 (six) hours as needed for headache. 06/01/18 06/01/19  Rebecka ApleyWebster, Glynn Freas P, MD  carvedilol (COREG) 6.25 MG tablet Take 6.25 mg by mouth 2 (two) times daily with a meal.    [provider]  clopidogrel (PLAVIX) 75 MG tablet Take 75 mg by mouth daily.    [provider]  furosemide (LASIX) 20 MG tablet TAKE ONE TABLET BY MOUTH ONCE DAILY 12/01/17   Antonieta IbaGollan, Timothy J, MD  ketorolac (TORADOL) 10 MG tablet Take 1 tablet (10 mg total) by mouth every 8 (eight) hours as needed for moderate pain (with food). 11/24/17   Rockne MenghiniNorman, Anne-Caroline, MD  omeprazole (PRILOSEC) 20 MG capsule Take 20 mg by mouth daily.    [provider]  potassium chloride (K-DUR) 10 MEQ tablet Take 1 tablet (10 mEq total) by mouth daily. 11/21/16   Antonieta IbaGollan, Timothy J, MD  predniSONE (STERAPRED UNI-PAK 21 TAB) 10 MG (21) TBPK tablet Take 6 tabs on day 1 Take 5 tabs on day 2 Take 4 tabs on day 3 Take 3 tabs on day 4 Take 2 tabs on day 5 Take 1 tab on day 6 06/01/18   Rebecka ApleyWebster, Eltha Tingley P,  MD  simvastatin (ZOCOR) 40 MG tablet Take 40 mg by mouth every evening.    [provider]    Allergies Latex; Lidocaine; and Pb-hyoscy-atropine-scopolamine  Family History  Problem Relation Age of Onset  . Heart Problems Father   . Hypertension Unknown   . CAD Unknown     Social History Social History   Tobacco Use  . Smoking status: Never Smoker  . Smokeless tobacco: Never Used  Substance Use Topics  . Alcohol use: No  . Drug use: No    Review of Systems  Constitutional: No fever/chills Eyes: No visual changes. ENT: No sore throat. Cardiovascular: Denies chest pain. Respiratory: Denies shortness of breath. Gastrointestinal: Nausea with no abdominal pain. no vomiting.   No diarrhea.  No constipation. Genitourinary: Negative for dysuria. Musculoskeletal: Negative for back pain. Skin: Negative for rash. Neurological: Headache   ____________________________________________   PHYSICAL EXAM:  VITAL SIGNS: ED Triage Vitals  Enc Vitals Group     BP 05/31/18 1937 (!) 173/83     Pulse Rate 05/31/18 1937 73     Resp 05/31/18 1937 18     Temp 05/31/18 1937 98.2 F (36.8 C)     Temp Source 05/31/18 1937 Oral     SpO2 05/31/18 1937 95 %     Weight 05/31/18 1935 215 lb (97.5 kg)     Height 05/31/18 1935 5\' 7"  (1.702 m)     Head Circumference --      Peak Flow --      Pain Score 05/31/18 2340 10     Pain Loc --      Pain Edu? --      Excl. in GC? --     Constitutional: Alert and oriented. Well appearing and in moderate distress. Eyes: Conjunctivae are normal. PERRL. EOMI. Head: Atraumatic. Nose: No congestion/rhinnorhea. Mouth/Throat: Mucous membranes are moist.  Oropharynx non-erythematous. Cardiovascular: Normal rate, regular rhythm. Grossly normal heart sounds.  Good peripheral circulation. Respiratory: Normal respiratory effort.  No retractions. Lungs CTAB. Gastrointestinal: Soft and nontender. No distention.  Positive bowel sounds Musculoskeletal: No lower extremity tenderness nor edema.   Neurologic:  Normal speech and language.  Cranial nerves II through XII are grossly intact with no focal motor neuro deficit Skin:  Skin is warm, dry and intact.  Psychiatric: Mood and affect are normal.   ____________________________________________   LABS (all labs ordered are listed, but only abnormal results are displayed)  Labs Reviewed - No data to display ____________________________________________  EKG  none ____________________________________________  RADIOLOGY  ED MD interpretation:  none  Official radiology report(s): No results found.  ____________________________________________   PROCEDURES  Procedure(s) performed:  None  Procedures  Critical Care performed: No  ____________________________________________   INITIAL IMPRESSION / ASSESSMENT AND PLAN / ED COURSE  As part of my medical decision making, I reviewed the following data within the electronic MEDICAL RECORD NUMBER Notes from prior ED visits and [place] Controlled Substance Database   This is a 67 year old female who comes into the hospital today with a headache.  The patient has been seen in the past and had a CT scan back in January which was unremarkable for her headache.  The patient states that this is typical for her previous migraines.  I will give the patient some Reglan, Benadryl and Toradol as well as a liter of normal saline.  The patient did receive her medications without any difficulties.  Her pain did improve and she was resting comfortably.  I will discharge the  patient and have her follow-up with her primary care physician as well as [facility].  The patient's spouse states that the patient has had loss due to death in her family and he thinks that the headaches are being caused by stress.  The patient will be discharged and should follow-up with her primary care doctor as well as [facility] for psychiatric evaluation.  The patient has no further complaints or concerns will be discharged home.      ____________________________________________   FINAL CLINICAL IMPRESSION(S) / ED DIAGNOSES  Final diagnoses:  Other migraine without status migrainosus, not intractable     ED Discharge Orders        Ordered    butalbital-acetaminophen-caffeine (FIORICET, ESGIC) 50-325-40 MG tablet  Every 6 hours PRN     06/01/18 0559    predniSONE (STERAPRED UNI-PAK 21 TAB) 10 MG (21) TBPK tablet     06/01/18 0559       Note:  This document was prepared using Dragon voice recognition software and may include unintentional dictation errors.    Rebecka Apley, MD 06/01/18 [phone]

## 2018-06-01 NOTE — ED Notes (Signed)
Peripheral IV discontinued. Catheter intact. No signs of infiltration or redness. Gauze applied to IV site.   Discharge instructions reviewed with patient. Questions fielded by this RN. Patient verbalizes understanding of instructions. Patient discharged home in stable condition per webster. No acute distress noted at time of discharge.

## 2018-06-01 NOTE — ED Notes (Signed)
Pt waiting patiently for treatment room; awake and alert; 

## 2018-06-03 ENCOUNTER — Encounter: Payer: Self-pay | Admitting: Emergency Medicine

## 2018-06-03 ENCOUNTER — Other Ambulatory Visit: Payer: Self-pay

## 2018-06-03 ENCOUNTER — Emergency Department
Admission: EM | Admit: 2018-06-03 | Discharge: 2018-06-03 | Disposition: A | Discharge status: Home / Self Care | Payer: Medicare Other | Attending: Emergency Medicine | Admitting: Emergency Medicine

## 2018-06-03 ENCOUNTER — Emergency Department: Payer: Medicare Other

## 2018-06-03 DIAGNOSIS — Z79899 Other long term (current) drug therapy: Secondary | ICD-10-CM | POA: Insufficient documentation

## 2018-06-03 DIAGNOSIS — J45909 Unspecified asthma, uncomplicated: Secondary | ICD-10-CM | POA: Diagnosis not present

## 2018-06-03 DIAGNOSIS — W19XXXA Unspecified fall, initial encounter: Secondary | ICD-10-CM

## 2018-06-03 DIAGNOSIS — Z7902 Long term (current) use of antithrombotics/antiplatelets: Secondary | ICD-10-CM | POA: Insufficient documentation

## 2018-06-03 DIAGNOSIS — I10 Essential (primary) hypertension: Secondary | ICD-10-CM | POA: Insufficient documentation

## 2018-06-03 DIAGNOSIS — M25532 Pain in left wrist: Secondary | ICD-10-CM | POA: Insufficient documentation

## 2018-06-03 DIAGNOSIS — Z7982 Long term (current) use of aspirin: Secondary | ICD-10-CM | POA: Diagnosis not present

## 2018-06-03 DIAGNOSIS — E119 Type 2 diabetes mellitus without complications: Secondary | ICD-10-CM | POA: Insufficient documentation

## 2018-06-03 DIAGNOSIS — M25562 Pain in left knee: Secondary | ICD-10-CM | POA: Insufficient documentation

## 2018-06-03 MED ORDER — IBUPROFEN 600 MG PO TABS
600.0000 mg | ORAL_TABLET | Freq: Once | ORAL | Status: AC
  Administered 2018-06-03: 600 mg via ORAL
  Filled 2018-06-03: qty 1

## 2018-06-03 MED ORDER — TRAMADOL HCL 50 MG PO TABS
50.0000 mg | ORAL_TABLET | Freq: Once | ORAL | Status: AC
  Administered 2018-06-03: 50 mg via ORAL
  Filled 2018-06-03: qty 1

## 2018-06-03 MED ORDER — TRAMADOL HCL 50 MG PO TABS: 50.0000 mg | ORAL_TABLET | Freq: Two times a day (BID) | ORAL | 0 refills | Status: DC | PRN

## 2018-06-03 NOTE — ED Notes (Signed)
Splint [facility].

## 2018-06-03 NOTE — ED Provider Notes (Signed)
[facility] Emergency Department Provider Note   ____________________________________________   First MD Initiated Contact with Patient 06/03/18 1321     (approximate)  I have reviewed the triage vital signs and the nursing notes.    HISTORY  Chief Complaint Fall; Knee Pain; and Wrist Pain    HPI Destiny Savage is a 67 y.o. female patient complaint of left wrist right knee pain secondary to a slip and fall while working outside prior to arrival.  Patient state increased pain with flexion and extending of the affected extremities.  Patient rates the pain as a 10/10.  No palliative measures prior to arrival.  Past Medical History:  Diagnosis Date  . Arthritis   . Asthma   . Diabetes mellitus   . GERD (gastroesophageal reflux disease)   . Hyperlipidemia   . Hypertension   . Mini stroke (HCC)   . Stroke [facility])     Patient Active Problem List   Diagnosis Date Noted  . 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 Stroke (HCC) 01/10/2017  . Mixed hyperlipidemia 11/21/2016  . Neck pain 11/21/2016  . Chest pain 12/23/2015  . GERD (gastroesophageal reflux disease) 12/23/2015  . Abdominal pain 04/30/2013  . Vertigo 05/11/2012  . Hypertension 05/11/2012  . Diabetes type 2, controlled (HCC) 05/11/2012  . Neck pain on right side 05/11/2012    Past Surgical History:  Procedure Laterality Date  . ABDOMINAL HYSTERECTOMY    . BREAST EXCISIONAL BIOPSY Left 1980's   benign  . CARDIAC CATHETERIZATION    . CHOLECYSTECTOMY      Prior to Admission medications   Medication Sig Start Date End Date Taking? Authorizing Provider  aspirin 81 MG tablet Take 1 tablet (81 mg total) by mouth daily. 01/11/17  Yes Milagros Loll, MD  butalbital-acetaminophen-caffeine (FIORICET, ESGIC) [phone] MG tablet Take 1-2 tablets by mouth every 6 (six) hours as needed for headache. 06/01/18 06/01/19 Yes Rebecka Apley, MD  carvedilol (COREG) 6.25 MG tablet Take 6.25  mg by mouth 2 (two) times daily with a meal.   Yes [provider]  clopidogrel (PLAVIX) 75 MG tablet Take 75 mg by mouth daily.   Yes [provider]  furosemide (LASIX) 20 MG tablet TAKE ONE TABLET BY MOUTH ONCE DAILY 12/01/17  Yes Gollan, Tollie Pizza, MD  omeprazole (PRILOSEC) 20 MG capsule Take 20 mg by mouth daily.   Yes [provider]  potassium chloride (K-DUR) 10 MEQ tablet Take 1 tablet (10 mEq total) by mouth daily. 11/21/16  Yes Gollan, Tollie Pizza, MD  predniSONE (STERAPRED UNI-PAK 21 TAB) 10 MG (21) TBPK tablet Take 6 tabs on day 1 Take 5 tabs on day 2 Take 4 tabs on day 3 Take 3 tabs on day 4 Take 2 tabs on day 5 Take 1 tab on day 6 06/01/18  Yes Rebecka Apley, MD  simvastatin (ZOCOR) 40 MG tablet Take 40 mg by mouth every evening.   Yes [provider]  ketorolac (TORADOL) 10 MG tablet Take 1 tablet (10 mg total) by mouth every 8 (eight) hours as needed for moderate pain (with food). 11/24/17   Rockne Menghini, MD  traMADol (ULTRAM) 50 MG tablet Take 1 tablet (50 mg total) by mouth every 12 (twelve) hours as needed. 06/03/18   Joni Reining, PA-C    Allergies Latex; Lidocaine; and Pb-hyoscy-atropine-scopolamine  Family History  Problem Relation Age of Onset  . Heart Problems Father   . Hypertension Unknown   .  CAD Unknown     Social History Social History   Tobacco Use  . Smoking status: Never Smoker  . Smokeless tobacco: Never Used  Substance Use Topics  . Alcohol use: No  . Drug use: No    Review of Systems Constitutional: No fever/chills Eyes: No visual changes. ENT: No sore throat. Cardiovascular: Denies chest pain. Respiratory: Denies shortness of breath. Gastrointestinal: No abdominal pain.  No nausea, no vomiting.  No diarrhea.  No constipation. Genitourinary: Negative for dysuria. Musculoskeletal: Left wrist and right knee pain Skin: Negative for rash. Neurological: Negative for headaches, focal weakness or  numbness. Endocrine:Diabetes, hyperlipidemia, and hypertension. Allergic/Immunilogical: See medication list. ____________________________________________   PHYSICAL EXAM:  VITAL SIGNS: ED Triage Vitals  Enc Vitals Group     BP 06/03/18 1312 (!) 166/91     Pulse Rate 06/03/18 1312 73     Resp 06/03/18 1312 19     Temp 06/03/18 1312 98.1 F (36.7 C)     Temp Source 06/03/18 1312 Oral     SpO2 06/03/18 1312 96 %     Weight 06/03/18 1313 215 lb (97.5 kg)     Height 06/03/18 1313 5\' 6"  (1.676 m)     Head Circumference --      Peak Flow --      Pain Score 06/03/18 1313 10     Pain Loc --      Pain Edu? --      Excl. in GC? --    Constitutional: Alert and oriented. Well appearing and in no acute distress. Cardiovascular: Normal rate, regular rhythm. Grossly normal heart sounds.  Good peripheral circulation.  Elevated blood pressure Respiratory: Normal respiratory effort.  No retractions. Lungs CTAB. Musculoskeletal: No obvious deformity to the left wrist the right knee.  Mild edema or lateral inferior right knee. Neurologic:  Normal speech and language. No gross focal neurologic deficits are appreciated. No gait instability. Skin:  Skin is warm, dry and intact. No rash noted. Psychiatric: Mood and affect are normal. Speech and behavior are normal.  ____________________________________________   LABS (all labs ordered are listed, but only abnormal results are displayed)  Labs Reviewed - No data to display ____________________________________________  EKG   ____________________________________________  RADIOLOGY  ED MD interpretation:    Official radiology report(s): Dg Wrist Complete Left  Result Date: 06/03/2018 CLINICAL DATA:  Fall, left wrist injury, pain, initial encounter. EXAM: LEFT WRIST - COMPLETE 3+ VIEW COMPARISON:  None. FINDINGS: No acute osseous abnormality. Mild degenerative changes of the first carpometacarpal joint. IMPRESSION: 1. No acute osseous  abnormality. 2. Mild first carpometacarpal joint osteoarthritis. Electronically Signed   By: Leanna Battles M.D.   On: 06/03/2018 14:49   Dg Knee Complete 4 Views Right  Result Date: 06/03/2018 CLINICAL DATA:  67 year old female with a history of fall and knee pain EXAM: RIGHT KNEE - COMPLETE 4+ VIEW COMPARISON:  None. FINDINGS: No acute displaced fracture. No joint effusion or soft tissue swelling. Vascular calcifications. Medial greater than lateral joint space narrowing with mild marginal osteophyte formation. IMPRESSION: Negative for acute bony abnormality. Early osteoarthritis. Electronically Signed   By: Gilmer Mor D.O.   On: 06/03/2018 14:50    ____________________________________________   PROCEDURES  Procedure(s) performed: None  Procedures  Critical Care performed: No  ____________________________________________   INITIAL IMPRESSION / ASSESSMENT AND PLAN / ED COURSE  As part of my medical decision making, I reviewed the following data within the electronic MEDICAL RECORD NUMBER    Left wrist and  right knee pain secondary to fall.  Discussed x-ray findings with patient was remarkable early arthritis.  Patient given discharge care instruction.  Patient placed in a wrist splint.  Patient advised take medication as directed and follow-up with PCP as needed.      ____________________________________________   FINAL CLINICAL IMPRESSION(S) / ED DIAGNOSES  Final diagnoses:  Left wrist pain  Acute pain of left knee  Fall, initial encounter     ED Discharge Orders        Ordered    traMADol (ULTRAM) 50 MG tablet  Every 12 hours PRN     06/03/18 1500       Note:  This document was prepared using Dragon voice recognition software and may include unintentional dictation errors.    Joni ReiningSmith, Ronald K, PA-C 06/03/18 1503    Sharyn CreamerQuale, Mark, MD 06/03/18 1517

## 2018-06-03 NOTE — ED Notes (Signed)
Esign not available at this time-pt verbalized understanding of d/c instructions.

## 2018-06-03 NOTE — ED Triage Notes (Signed)
Pt reports was working outside and she slipped and fell on the grass hurting her right knee and left wrist.

## 2018-06-03 NOTE — ED Notes (Signed)
Says was moving trash can outside and it slipped forward and she went down on knees and then tried to catch self with left arm.  Pain bilateral knees, right foot, left wrist, thumb.  Good circ beyond all injuries.

## 2018-06-03 NOTE — Discharge Instructions (Signed)
Wear wrist splint for 2 to 3 days as needed.  Take pain medication as needed.  Follow-up with PCP.

## 2018-09-01 ENCOUNTER — Other Ambulatory Visit: Payer: Self-pay | Admitting: Cardiovascular Disease

## 2018-09-04 ENCOUNTER — Other Ambulatory Visit: Payer: Self-pay | Admitting: Cardiovascular Disease

## 2018-09-30 ENCOUNTER — Other Ambulatory Visit: Payer: Self-pay | Admitting: Cardiovascular Disease

## 2018-10-29 ENCOUNTER — Other Ambulatory Visit: Payer: Self-pay | Admitting: Internal Medicine

## 2018-10-29 DIAGNOSIS — Z1231 Encounter for screening mammogram for malignant neoplasm of breast: Secondary | ICD-10-CM

## 2018-12-07 ENCOUNTER — Ambulatory Visit
Admission: RE | Admit: 2018-12-07 | Discharge: 2018-12-07 | Disposition: A | Discharge status: Home / Self Care | Payer: Medicare Other | Source: Ambulatory Visit | Attending: Internal Medicine | Admitting: Internal Medicine

## 2018-12-07 DIAGNOSIS — Z1231 Encounter for screening mammogram for malignant neoplasm of breast: Secondary | ICD-10-CM

## 2019-01-07 ENCOUNTER — Inpatient Hospital Stay: Payer: Medicare Other

## 2019-01-07 ENCOUNTER — Other Ambulatory Visit: Payer: Self-pay

## 2019-01-07 ENCOUNTER — Emergency Department: Payer: Medicare Other

## 2019-01-07 ENCOUNTER — Inpatient Hospital Stay
Admit: 2019-01-07 | Discharge: 2019-01-07 | Disposition: A | Discharge status: Home / Self Care | Payer: Medicare Other | Attending: Internal Medicine | Admitting: Internal Medicine

## 2019-01-07 ENCOUNTER — Observation Stay
Admission: EM | Admit: 2019-01-07 | Discharge: 2019-01-09 | DRG: 149 | Disposition: A | Discharge status: Home Health | Payer: Medicare Other | Attending: Internal Medicine | Admitting: Internal Medicine

## 2019-01-07 DIAGNOSIS — E785 Hyperlipidemia, unspecified: Secondary | ICD-10-CM | POA: Diagnosis present

## 2019-01-07 DIAGNOSIS — Z8249 Family history of ischemic heart disease and other diseases of the circulatory system: Secondary | ICD-10-CM | POA: Diagnosis not present

## 2019-01-07 DIAGNOSIS — Z7982 Long term (current) use of aspirin: Secondary | ICD-10-CM | POA: Diagnosis not present

## 2019-01-07 DIAGNOSIS — Z7902 Long term (current) use of antithrombotics/antiplatelets: Secondary | ICD-10-CM

## 2019-01-07 DIAGNOSIS — R42 Dizziness and giddiness: Principal | ICD-10-CM | POA: Diagnosis present

## 2019-01-07 DIAGNOSIS — Z90710 Acquired absence of both cervix and uterus: Secondary | ICD-10-CM

## 2019-01-07 DIAGNOSIS — R51 Headache: Secondary | ICD-10-CM | POA: Diagnosis not present

## 2019-01-07 DIAGNOSIS — Z79899 Other long term (current) drug therapy: Secondary | ICD-10-CM | POA: Diagnosis not present

## 2019-01-07 DIAGNOSIS — E119 Type 2 diabetes mellitus without complications: Secondary | ICD-10-CM | POA: Diagnosis not present

## 2019-01-07 DIAGNOSIS — I10 Essential (primary) hypertension: Secondary | ICD-10-CM | POA: Diagnosis present

## 2019-01-07 DIAGNOSIS — J45909 Unspecified asthma, uncomplicated: Secondary | ICD-10-CM | POA: Diagnosis not present

## 2019-01-07 DIAGNOSIS — Z8673 Personal history of transient ischemic attack (TIA), and cerebral infarction without residual deficits: Secondary | ICD-10-CM

## 2019-01-07 DIAGNOSIS — Z833 Family history of diabetes mellitus: Secondary | ICD-10-CM

## 2019-01-07 DIAGNOSIS — K219 Gastro-esophageal reflux disease without esophagitis: Secondary | ICD-10-CM | POA: Diagnosis present

## 2019-01-07 DIAGNOSIS — R29708 NIHSS score 8: Secondary | ICD-10-CM | POA: Diagnosis present

## 2019-01-07 DIAGNOSIS — I639 Cerebral infarction, unspecified: Secondary | ICD-10-CM | POA: Diagnosis present

## 2019-01-07 LAB — DIFFERENTIAL
Abs Immature Granulocytes: 0.03 10*3/uL (ref 0.00–0.07)
BASOS ABS: 0.1 10*3/uL (ref 0.0–0.1)
Basophils Relative: 1 %
Eosinophils Absolute: 0.5 10*3/uL (ref 0.0–0.5)
Eosinophils Relative: 6 %
Immature Granulocytes: 0 %
Lymphocytes Relative: 25 %
Lymphs Abs: 2 10*3/uL (ref 0.7–4.0)
Monocytes Absolute: 0.4 10*3/uL (ref 0.1–1.0)
Monocytes Relative: 5 %
Neutro Abs: 5.1 10*3/uL (ref 1.7–7.7)
Neutrophils Relative %: 63 %

## 2019-01-07 LAB — COMPREHENSIVE METABOLIC PANEL
ALT: 11 U/L (ref 0–44)
ANION GAP: 9 (ref 5–15)
AST: 17 U/L (ref 15–41)
Albumin: 3.9 g/dL (ref 3.5–5.0)
Alkaline Phosphatase: 77 U/L (ref 38–126)
BUN: 14 mg/dL (ref 8–23)
CALCIUM: 9.3 mg/dL (ref 8.9–10.3)
CO2: 26 mmol/L (ref 22–32)
Chloride: 102 mmol/L (ref 98–111)
Creatinine, Ser: 0.79 mg/dL (ref 0.44–1.00)
GFR calc Af Amer: >60 mL/min (ref >60)
GFR calc non Af Amer: >60 mL/min (ref >60)
Glucose, Bld: 216 mg/dL — ABNORMAL HIGH (ref 70–99)
Potassium: 3.9 mmol/L (ref 3.5–5.1)
Sodium: 137 mmol/L (ref 135–145)
Total Bilirubin: 0.6 mg/dL (ref 0.3–1.2)
Total Protein: 7.1 g/dL (ref 6.5–8.1)

## 2019-01-07 LAB — CBC
HCT: 39 % (ref 36.0–46.0)
Hemoglobin: 12.5 g/dL (ref 12.0–15.0)
MCH: 29.8 pg (ref 26.0–34.0)
MCHC: 32.1 g/dL (ref 30.0–36.0)
MCV: 92.9 fL (ref 80.0–100.0)
Platelets: 265 10*3/uL (ref 150–400)
RBC: 4.2 MIL/uL (ref 3.87–5.11)
RDW: 12.7 % (ref 11.5–15.5)
WBC: 8.1 10*3/uL (ref 4.0–10.5)
nRBC: 0 % (ref 0.0–0.2)

## 2019-01-07 LAB — GLUCOSE, CAPILLARY
Glucose-Capillary: 196 mg/dL — ABNORMAL HIGH (ref 70–99)
Glucose-Capillary: 102 mg/dL — ABNORMAL HIGH (ref 70–99)
Glucose-Capillary: 125 mg/dL — ABNORMAL HIGH (ref 70–99)

## 2019-01-07 LAB — PROTIME-INR
INR: 0.94
Prothrombin Time: 12.5 seconds (ref 11.4–15.2)

## 2019-01-07 LAB — APTT: aPTT: 37 seconds — ABNORMAL HIGH (ref 24–36)

## 2019-01-07 MED ORDER — KETOROLAC TROMETHAMINE 10 MG PO TABS
10.0000 mg | ORAL_TABLET | Freq: Three times a day (TID) | ORAL | Status: DC | PRN
  Filled 2019-01-07: qty 1

## 2019-01-07 MED ORDER — BUTALBITAL-APAP-CAFFEINE 50-325-40 MG PO TABS
1.0000 | ORAL_TABLET | Freq: Four times a day (QID) | ORAL | Status: DC | PRN
  Administered 2019-01-09: 2 via ORAL
  Filled 2019-01-07: qty 2

## 2019-01-07 MED ORDER — ENOXAPARIN SODIUM 40 MG/0.4ML ~~LOC~~ SOLN
40.0000 mg | SUBCUTANEOUS | Status: DC
  Administered 2019-01-07 – 2019-01-08 (×2): 40 mg via SUBCUTANEOUS
  Filled 2019-01-07 (×2): qty 0.4

## 2019-01-07 MED ORDER — SODIUM CHLORIDE 0.9% FLUSH: 3.0000 mL | Freq: Once | INTRAVENOUS | Status: DC

## 2019-01-07 MED ORDER — CLOPIDOGREL BISULFATE 75 MG PO TABS
75.0000 mg | ORAL_TABLET | Freq: Every day | ORAL | Status: DC
  Administered 2019-01-08 – 2019-01-09 (×2): 75 mg via ORAL
  Filled 2019-01-07 (×2): qty 1

## 2019-01-07 MED ORDER — ACETAMINOPHEN 650 MG RE SUPP: 650.0000 mg | RECTAL | Status: DC | PRN

## 2019-01-07 MED ORDER — ACETAMINOPHEN 160 MG/5ML PO SOLN
650.0000 mg | ORAL | Status: DC | PRN
  Filled 2019-01-07: qty 20.3

## 2019-01-07 MED ORDER — INSULIN ASPART 100 UNIT/ML ~~LOC~~ SOLN
0.0000 [IU] | Freq: Three times a day (TID) | SUBCUTANEOUS | Status: DC
  Administered 2019-01-08 (×2): 1 [IU] via SUBCUTANEOUS
  Filled 2019-01-07 (×2): qty 1

## 2019-01-07 MED ORDER — SODIUM CHLORIDE 0.9 % IV SOLN
INTRAVENOUS | Status: DC
  Administered 2019-01-07 – 2019-01-08 (×2): via INTRAVENOUS

## 2019-01-07 MED ORDER — ACETAMINOPHEN 325 MG PO TABS
650.0000 mg | ORAL_TABLET | ORAL | Status: DC | PRN
  Administered 2019-01-08: 650 mg via ORAL
  Filled 2019-01-07: qty 2

## 2019-01-07 MED ORDER — SENNOSIDES-DOCUSATE SODIUM 8.6-50 MG PO TABS: 1.0000 | ORAL_TABLET | Freq: Every evening | ORAL | Status: DC | PRN

## 2019-01-07 MED ORDER — SIMVASTATIN 40 MG PO TABS
40.0000 mg | ORAL_TABLET | Freq: Every evening | ORAL | Status: DC
  Administered 2019-01-07: 40 mg via ORAL
  Filled 2019-01-07 (×2): qty 1
  Filled 2019-01-07: qty 2

## 2019-01-07 MED ORDER — ONDANSETRON 4 MG PO TBDP
4.0000 mg | ORAL_TABLET | Freq: Three times a day (TID) | ORAL | Status: DC | PRN
  Filled 2019-01-07: qty 1

## 2019-01-07 MED ORDER — CLOPIDOGREL BISULFATE 75 MG PO TABS: 75.0000 mg | ORAL_TABLET | Freq: Every day | ORAL | Status: DC

## 2019-01-07 MED ORDER — LORAZEPAM 2 MG/ML IJ SOLN
1.0000 mg | Freq: Once | INTRAMUSCULAR | Status: AC
  Administered 2019-01-07: 1 mg via INTRAVENOUS
  Filled 2019-01-07: qty 1

## 2019-01-07 MED ORDER — POTASSIUM CHLORIDE ER 10 MEQ PO TBCR: 10.0000 meq | EXTENDED_RELEASE_TABLET | Freq: Every day | ORAL | Status: DC

## 2019-01-07 MED ORDER — PANTOPRAZOLE SODIUM 40 MG PO TBEC
40.0000 mg | DELAYED_RELEASE_TABLET | Freq: Every day | ORAL | Status: DC
  Administered 2019-01-08 – 2019-01-09 (×2): 40 mg via ORAL
  Filled 2019-01-07 (×2): qty 1

## 2019-01-07 MED ORDER — STROKE: EARLY STAGES OF RECOVERY BOOK
Freq: Once | Status: AC
  Administered 2019-01-07: 18:00:00

## 2019-01-07 MED ORDER — ASPIRIN 81 MG PO CHEW
324.0000 mg | CHEWABLE_TABLET | Freq: Once | ORAL | Status: AC
  Administered 2019-01-07: 324 mg via ORAL
  Filled 2019-01-07: qty 4

## 2019-01-07 MED ORDER — ASPIRIN EC 81 MG PO TBEC
81.0000 mg | DELAYED_RELEASE_TABLET | Freq: Every day | ORAL | Status: DC
  Administered 2019-01-08 – 2019-01-09 (×2): 81 mg via ORAL
  Filled 2019-01-07 (×2): qty 1

## 2019-01-07 NOTE — ED Triage Notes (Signed)
Pt states "I didn't feel right yesterday, feels like a rubber band is squeezing my head." States she is having a hard time getting her words out, is slow to speak however, speech is clear and appropriate, however, momentary confusion noted, unable to figure out how to close her eyes and keep them closed upon assessment. No arm drift. NAD. Pt cannot tell me a time all of this began yesterday, however, was able to say it began after lunch, before dinner.

## 2019-01-07 NOTE — Progress Notes (Signed)
Family Meeting Note  Advance Directive:no  Today a meeting took place with the Patient.and spouse The following clinical team members were present during this meeting:MD  The following were discussed:Patient's diagnosis: Acute CVA, Patient's progosis: > 12 months and Goals for treatment: Full Code  Additional follow-up to be provided: chaplain consult to create AD  Time spent during discussion:16 minutes  Khani Paino, Patricia Pesa, MD

## 2019-01-07 NOTE — Progress Notes (Signed)
PT Cancellation Note  Patient Details Name: Destiny Savage MRN: [id number] DOB: 1951/06/19   Cancelled Treatment:    Reason Eval/Treat Not Completed: Patient at procedure or test/unavailable(PT evaluation attempted; pt off floor for ultrasound and MRI. Will attempt again at later date time. )  4:11 PM, 01/07/19 Rosamaria Lints, PT, DPT Physical Therapist - [facility]  [phone] (ASCOM)     Mckinzy Fuller C 01/07/2019, 4:11 PM

## 2019-01-07 NOTE — Progress Notes (Signed)
Notify Dr. Juliene Pina about patient if she can have diet, she passed her swallow study in the ED. Also patient had her Ativan to early for her MRI, it was given at 1351 and MRI is not ready for her yet, order given for Ativan once prior to MRI. RN will continue to monitor.

## 2019-01-07 NOTE — ED Notes (Signed)
PT ambulatory to BR without difficulty.  Pt denies needs or c/o at this time.  Awaiting MRI, will continue to monitor.

## 2019-01-07 NOTE — ED Provider Notes (Addendum)
[facility] Emergency Department Provider Note   ____________________________________________   First MD Initiated Contact with Patient 01/07/19 1232     (approximate)  I have reviewed the triage vital signs and the nursing notes.   HISTORY  Chief Complaint Cerebrovascular Accident   HPI Destiny Savage is a 68 y.o. female reports last night she got dizzy husband confirms it.  Now she is planning she has word finding difficulty and the right side of her body will not always follow her intentions and wishes.  Additionally she says her head feels like a rubber band is around that there is some pressure.   Past Medical History:  Diagnosis Date  . Arthritis   . Asthma   . Diabetes mellitus   . GERD (gastroesophageal reflux disease)   . Hyperlipidemia   . Hypertension   . Mini stroke (HCC)   . Stroke [facility])     Patient Active Problem List   Diagnosis Date Noted  . 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 Stroke (HCC) 01/10/2017  . Mixed hyperlipidemia 11/21/2016  . Neck pain 11/21/2016  . Chest pain 12/23/2015  . GERD (gastroesophageal reflux disease) 12/23/2015  . Abdominal pain 04/30/2013  . Vertigo 05/11/2012  . Hypertension 05/11/2012  . Diabetes type 2, controlled (HCC) 05/11/2012  . Neck pain on right side 05/11/2012    Past Surgical History:  Procedure Laterality Date  . ABDOMINAL HYSTERECTOMY    . BREAST EXCISIONAL BIOPSY Left 1980's   benign  . CARDIAC CATHETERIZATION    . CHOLECYSTECTOMY      Prior to Admission medications   Medication Sig Start Date End Date Taking? Authorizing Provider  aspirin 81 MG tablet Take 1 tablet (81 mg total) by mouth daily. 01/11/17   Milagros Loll, MD  butalbital-acetaminophen-caffeine (FIORICET, ESGIC) [phone] MG tablet Take 1-2 tablets by mouth every 6 (six) hours as needed for headache. 06/01/18 06/01/19  Rebecka Apley, MD  carvedilol  (COREG) 6.25 MG tablet Take 6.25 mg by mouth 2 (two) times daily with a meal.    [provider]  clopidogrel (PLAVIX) 75 MG tablet Take 75 mg by mouth daily.    [provider]  furosemide (LASIX) 20 MG tablet TAKE 1 TABLET BY MOUTH ONCE DAILY 09/04/18   Antonieta Iba, MD  ketorolac (TORADOL) 10 MG tablet Take 1 tablet (10 mg total) by mouth every 8 (eight) hours as needed for moderate pain (with food). 11/24/17   Rockne Menghini, MD  omeprazole (PRILOSEC) 20 MG capsule Take 20 mg by mouth daily.    [provider]  ondansetron (ZOFRAN-ODT) 4 MG disintegrating tablet Take 4 mg by mouth every 8 (eight) hours as needed for nausea. 09/22/18   [provider]  potassium chloride (K-DUR) 10 MEQ tablet Take 1 tablet (10 mEq total) by mouth daily. 11/21/16   Antonieta Iba, MD  predniSONE (STERAPRED UNI-PAK 21 TAB) 10 MG (21) TBPK tablet Take 6 tabs on day 1 Take 5 tabs on day 2 Take 4 tabs on day 3 Take 3 tabs on day 4 Take 2 tabs on day 5 Take 1 tab on day 6 06/01/18   Rebecka Apley, MD  simvastatin (ZOCOR) 40 MG tablet Take 40 mg by mouth every evening.    [provider]  traMADol (ULTRAM) 50 MG tablet Take 1 tablet (50 mg total) by mouth every 12 (twelve) hours as needed. 06/03/18   Joni Reining,  PA-C    Allergies Latex; Lidocaine; and Pb-hyoscy-atropine-scopolamine  Family History  Problem Relation Age of Onset  . Heart Problems Father   . Hypertension Other   . CAD Other     Social History Social History   Tobacco Use  . Smoking status: Never Smoker  . Smokeless tobacco: Never Used  Substance Use Topics  . Alcohol use: No  . Drug use: No    Review of Systems  Constitutional: No fever/chills Eyes: No visual changes. ENT: No sore throat. Cardiovascular: Denies chest pain. Respiratory: Denies shortness of breath. Gastrointestinal: No abdominal pain.  No nausea, no vomiting.  No diarrhea.  No  constipation. Genitourinary: Negative for dysuria. Musculoskeletal: Negative for back pain. Skin: Negative for rash. Neurological: See HPI  ____________________________________________   PHYSICAL EXAM:  VITAL SIGNS: ED Triage Vitals  Enc Vitals Group     BP 01/07/19 1104 (!) 142/65     Pulse Rate 01/07/19 1104 77     Resp 01/07/19 1104 16     Temp 01/07/19 1104 98.1 F (36.7 C)     Temp Source 01/07/19 1104 Oral     SpO2 01/07/19 1104 99 %     Weight 01/07/19 1108 200 lb (90.7 kg)     Height 01/07/19 1108 5\' 9"  (1.753 m)     Head Circumference --      Peak Flow --      Pain Score 01/07/19 1108 10     Pain Loc --      Pain Edu? --      Excl. in GC? --     Constitutional: Alert and oriented. Well appearing but upset. Eyes: Conjunctivae are normal. PERRL.  Patient has difficulty with eye movements.. Head: Atraumatic. Nose: No congestion/rhinnorhea. Mouth/Throat: Mucous membranes are moist.  Oropharynx non-erythematous. Neck: No stridor.   Cardiovascular: Normal rate, regular rhythm. Grossly normal heart sounds.  Good peripheral circulation. Respiratory: Normal respiratory effort.  No retractions. Lungs CTAB. Gastrointestinal: Soft and nontender. No distention. No abdominal bruits. No CVA tenderness. Musculoskeletal: No lower extremity tenderness nor edema.   Neurologic: Patient speech is clear but she has trouble finding her words properly.  She has a little ataxia on the right side with finger-to-nose and rapid alternating movements she cannot reliably get her right leg to lift up off the bed.  Although the strength appears to be normal.  She has trouble tracking things with her eyes. Skin:  Skin is warm, dry and intact. No rash noted.  ____________________________________________   LABS (all labs ordered are listed, but only abnormal results are displayed)  Labs Reviewed  GLUCOSE, CAPILLARY - Abnormal; Notable for the following components:      Result Value    Glucose-Capillary 196 (*)    All other components within normal limits  APTT - Abnormal; Notable for the following components:   aPTT 37 (*)    All other components within normal limits  COMPREHENSIVE METABOLIC PANEL - Abnormal; Notable for the following components:   Glucose, Bld 216 (*)    All other components within normal limits  PROTIME-INR  CBC  DIFFERENTIAL  HIV ANTIBODY (ROUTINE TESTING W REFLEX)  CBC  CREATININE, SERUM  CBG MONITORING, ED   ____________________________________________  EKG EKG read and interpreted by me shows normal sinus rhythm rate of 73 normal axis essentially normal EKG _____________________________________  RADIOLOGY  ED MD interpretation: T read by radiology reviewed by me shows no acute disease  Official radiology report(s): Ct Head Wo Contrast  Result  Date: 01/07/2019 CLINICAL DATA:  68 year old female with a history of headache EXAM: CT HEAD WITHOUT CONTRAST TECHNIQUE: Contiguous axial images were obtained from the base of the skull through the vertex without intravenous contrast. COMPARISON:  11/24/2017, MR 12/16/2017 FINDINGS: Brain: No acute intracranial hemorrhage. No midline shift or mass effect. Gray-white differentiation maintained. Unremarkable appearance of the ventricular system. Vascular: Unremarkable. Skull: No acute fracture.  No aggressive bone lesion identified. Sinuses/Orbits: Unremarkable appearance of the orbits. Mastoid air cells clear. No middle ear effusion. No significant sinus disease. Other: None IMPRESSION: Negative head CT Electronically Signed   By: Gilmer Mor D.O.   On: 01/07/2019 11:34    ____________________________________________   PROCEDURES  Procedure(s) performed:   Procedures  Critical Care performed:   ____________________________________________   INITIAL IMPRESSION / ASSESSMENT AND PLAN / ED COURSE   Patient with strokelike symptoms starting last night.  She is not a TPA candidate however she  will need to come in the hospital.  Will make sure she had her aspirin today get an MRI.       ____________________________________________   FINAL CLINICAL IMPRESSION(S) / ED DIAGNOSES  Final diagnoses:  Cerebrovascular accident (CVA), unspecified mechanism [facility])     ED Discharge Orders    None       Note:  This document was prepared using Dragon voice recognition software and may include unintentional dictation errors.    Arnaldo Natal, MD 01/07/19 1314    Arnaldo Natal, MD 01/15/19 [phone]

## 2019-01-07 NOTE — ED Notes (Signed)
Dr. Mody at bedside.  

## 2019-01-07 NOTE — H&P (Signed)
[facility] [place] at [facility]   PATIENT NAME: Destiny Savage    MR#:  [id number]  DATE OF BIRTH:  [birth year]  DATE OF ADMISSION:  01/07/2019  PRIMARY CARE PHYSICIAN: Jaclyn Shaggy, MD   REQUESTING/REFERRING PHYSICIAN: dr Darnelle Catalan  CHIEF COMPLAINT:   dizziness HISTORY OF PRESENT ILLNESS:  Destiny Savage  is a 68 y.o. female with a known history of TIA, diabetes and essential hypertension who presents today to the emergency room with chief complaint of dizziness.  Patient reports that since yesterday she has had a headache associated with dizziness and difficulty finding words. Symptoms have persisted through the night so she comes to the ER for further evaluation.  Initial head CT shows no acute stroke however patient continues to have dizziness.  She denies blurred vision or double vision.  On physical exam her right side is weaker than the left.  PAST MEDICAL HISTORY:   Past Medical History:  Diagnosis Date  . Arthritis   . Asthma   . Diabetes mellitus   . GERD (gastroesophageal reflux disease)   . Hyperlipidemia   . Hypertension   . Mini stroke (HCC)   . Stroke [facility])     PAST SURGICAL HISTORY:   Past Surgical History:  Procedure Laterality Date  . ABDOMINAL HYSTERECTOMY    . BREAST EXCISIONAL BIOPSY Left 1980's   benign  . CARDIAC CATHETERIZATION    . CHOLECYSTECTOMY      SOCIAL HISTORY:   Social History   Tobacco Use  . Smoking status: Never Smoker  . Smokeless tobacco: Never Used  Substance Use Topics  . Alcohol use: No    FAMILY HISTORY:   Family History  Problem Relation Age of Onset  . Heart Problems Father   . Hypertension Other   . CAD Other     DRUG ALLERGIES:   Allergies  Allergen Reactions  . Latex Hives  . Lidocaine Hives  . Pb-Hyoscy-Atropine-Scopolamine Hives and Rash    REVIEW OF SYSTEMS:   Review of Systems  Constitutional: Negative.  Negative for chills, fever and malaise/fatigue.  HENT: Negative.   Negative for ear discharge, ear pain, hearing loss, nosebleeds and sore throat.   Eyes: Negative.  Negative for blurred vision and pain.  Respiratory: Negative.  Negative for cough, hemoptysis, shortness of breath and wheezing.   Cardiovascular: Negative.  Negative for chest pain, palpitations and leg swelling.  Gastrointestinal: Negative.  Negative for abdominal pain, blood in stool, diarrhea, nausea and vomiting.  Genitourinary: Negative.  Negative for dysuria.  Musculoskeletal: Negative.  Negative for back pain.  Skin: Negative.   Neurological: Positive for dizziness, sensory change, speech change, focal weakness and headaches. Negative for tremors and seizures.  Endo/Heme/Allergies: Negative.  Does not bruise/bleed easily.  Psychiatric/Behavioral: Negative.  Negative for depression, hallucinations and suicidal ideas.    MEDICATIONS AT HOME:   Prior to Admission medications   Medication Sig Start Date End Date Taking? Authorizing Provider  aspirin 81 MG tablet Take 1 tablet (81 mg total) by mouth daily. 01/11/17   Milagros Loll, MD  butalbital-acetaminophen-caffeine (FIORICET, ESGIC) [phone] MG tablet Take 1-2 tablets by mouth every 6 (six) hours as needed for headache. 06/01/18 06/01/19  Rebecka Apley, MD  carvedilol (COREG) 6.25 MG tablet Take 6.25 mg by mouth 2 (two) times daily with a meal.    [provider]  clopidogrel (PLAVIX) 75 MG tablet Take 75 mg by mouth daily.    [provider]  furosemide (LASIX) 20 MG  tablet TAKE 1 TABLET BY MOUTH ONCE DAILY 09/04/18   Antonieta Iba, MD  ketorolac (TORADOL) 10 MG tablet Take 1 tablet (10 mg total) by mouth every 8 (eight) hours as needed for moderate pain (with food). 11/24/17   Rockne Menghini, MD  omeprazole (PRILOSEC) 20 MG capsule Take 20 mg by mouth daily.    [provider]  ondansetron (ZOFRAN-ODT) 4 MG disintegrating tablet Take 4 mg by mouth every 8 (eight) hours as needed for nausea.  09/22/18   [provider]  potassium chloride (K-DUR) 10 MEQ tablet Take 1 tablet (10 mEq total) by mouth daily. 11/21/16   Antonieta Iba, MD  predniSONE (STERAPRED UNI-PAK 21 TAB) 10 MG (21) TBPK tablet Take 6 tabs on day 1 Take 5 tabs on day 2 Take 4 tabs on day 3 Take 3 tabs on day 4 Take 2 tabs on day 5 Take 1 tab on day 6 06/01/18   Rebecka Apley, MD  simvastatin (ZOCOR) 40 MG tablet Take 40 mg by mouth every evening.    [provider]  traMADol (ULTRAM) 50 MG tablet Take 1 tablet (50 mg total) by mouth every 12 (twelve) hours as needed. 06/03/18   Joni Reining, PA-C      VITAL SIGNS:  Blood pressure (!) 154/83, pulse 71, temperature 98.1 F (36.7 C), temperature source Oral, resp. rate 17, height 5\' 9"  (1.753 m), weight 90.7 kg, SpO2 99 %.  PHYSICAL EXAMINATION:   Physical Exam Constitutional:      General: She is not in acute distress. HENT:     Head: Normocephalic.  Eyes:     General: No scleral icterus. Neck:     Musculoskeletal: Normal range of motion and neck supple.     Vascular: No JVD.     Trachea: No tracheal deviation.  Cardiovascular:     Rate and Rhythm: Normal rate and regular rhythm.     Heart sounds: Normal heart sounds. No murmur. No friction rub. No gallop.   Pulmonary:     Effort: Pulmonary effort is normal. No respiratory distress.     Breath sounds: Normal breath sounds. No wheezing or rales.  Chest:     Chest wall: No tenderness.  Abdominal:     General: Bowel sounds are normal. There is no distension.     Palpations: Abdomen is soft. There is no mass.     Tenderness: There is no abdominal tenderness. There is no guarding or rebound.  Musculoskeletal: Normal range of motion.  Skin:    General: Skin is warm.     Findings: No erythema or rash.  Neurological:     Mental Status: She is alert and oriented to person, place, and time.     Sensory: Sensory deficit present.     Motor: Weakness present.     Coordination:  Coordination abnormal.     Comments: Right lower extremity 4-5 left lower extremity 5 out of 5 right upper extremity 4-5 left upper extremity 5 out of 5  Psychiatric:        Judgment: Judgment normal.       LABORATORY PANEL:   CBC Recent Labs  Lab 01/07/19 1115  WBC 8.1  HGB 12.5  HCT 39.0  PLT 265   ------------------------------------------------------------------------------------------------------------------  Chemistries  Recent Labs  Lab 01/07/19 1115  NA 137  K 3.9  CL 102  CO2 26  GLUCOSE 216*  BUN 14  CREATININE 0.79  CALCIUM 9.3  AST 17  ALT  11  ALKPHOS 77  BILITOT 0.6   ------------------------------------------------------------------------------------------------------------------  Cardiac Enzymes No results for input(s): TROPONINI in the last 168 hours. ------------------------------------------------------------------------------------------------------------------  RADIOLOGY:  Ct Head Wo Contrast  Result Date: 01/07/2019 CLINICAL DATA:  68 year old female with a history of headache EXAM: CT HEAD WITHOUT CONTRAST TECHNIQUE: Contiguous axial images were obtained from the base of the skull through the vertex without intravenous contrast. COMPARISON:  11/24/2017, MR 12/16/2017 FINDINGS: Brain: No acute intracranial hemorrhage. No midline shift or mass effect. Gray-white differentiation maintained. Unremarkable appearance of the ventricular system. Vascular: Unremarkable. Skull: No acute fracture.  No aggressive bone lesion identified. Sinuses/Orbits: Unremarkable appearance of the orbits. Mastoid air cells clear. No middle ear effusion. No significant sinus disease. Other: None IMPRESSION: Negative head CT Electronically Signed   By: Gilmer MorJaime  Wagner D.O.   On: 01/07/2019 11:34    EKG:  Normal sinus rhythm no ST elevation or depression  IMPRESSION AND PLAN:   68 year old female with a history of TIA and diabetes who presents with dizziness and  right-sided weakness.  1.  Acute CVA: Continue CVA work-up with MRI/MRA brain, carotid ultrasound, echocardiogram Check lipid panel and A1c Continue aspirin with Plavix Continue statin Neurology consultation along with PT, OT and speech consultation Allow permissive hypertension  2.  Essential hypertension: Allow permissive hypertension due to acute CVA PRN hydralazine ordered  3.  Hyperlipidemia: Continue statin and check lipid panel      All the records are reviewed and case discussed with ED provider. Management plans discussed with the patient and she is  in agreement  CODE STATUS: full  TOTAL TIME TAKING CARE OF THIS PATIENT: 44 minutes.    Chalsea Darko M.D on 01/07/2019 at 1:34 PM  Between 7am to 6pm - Pager - [phone]  After 6pm go to [email or web address] - Social research officer, [email or web address] [email or web address]  [facility]  Office  [phone]  CC: Primary care physician; Jaclyn Shaggyate, Denny C, MD

## 2019-01-07 NOTE — ED Triage Notes (Signed)
Glucose 196 in triage, husband states symptoms began around 1800 last pm.

## 2019-01-07 NOTE — Progress Notes (Signed)
   01/07/19 2035  Clinical Encounter Type  Visited With Patient and family together  Visit Type Initial  Referral From Nurse  Consult/Referral To Chaplain  Spiritual Encounters  Spiritual Needs Emotional  CH entered room and patient was resting on hospital bed. Pt was alert and awake. Was not talkative. Patient's spouse was present. CH provided pastoral care through building rapport. Followed up with patient to see if she were interested in AD info. She didn't know anything about it. CH provided patient with AD document upon request. They shared they would look over form and inform CH in the morning if they were still interested.

## 2019-01-07 NOTE — ED Notes (Signed)
ED TO INPATIENT HANDOFF REPORT  ED Nurse Name and Phone #: Victorino Dike [phone]  S Name/Age/Gender Destiny Savage 68 y.o. female Room/Bed: ED11A/ED11A  Code Status   Code Status: Full Code  Home/SNF/Other Home Patient oriented to: self, place, time and situation Is this baseline? Yes   Triage Complete: Triage complete  Chief Complaint poss stroke  Triage Note Pt states "I didn't feel right yesterday, feels like a rubber band is squeezing my head." States she is having a hard time getting her words out, is slow to speak however, speech is clear and appropriate, however, momentary confusion noted, unable to figure out how to close her eyes and keep them closed upon assessment. No arm drift. NAD. Pt cannot tell me a time all of this began yesterday, however, was able to say it began after lunch, before dinner.   Glucose 196 in triage, husband states symptoms began around 1800 last pm.    Allergies Allergies  Allergen Reactions  . Latex Hives  . Lidocaine Hives  . Pb-Hyoscy-Atropine-Scopolamine Hives and Rash    Level of Care/Admitting Diagnosis ED Disposition    ED Disposition Condition Comment   Admit  Hospital Area: [facility] [[id number]]  Level of Care: Med-Surg [16]  Diagnosis: Stroke (cerebrum) [facility]) [[id number]]  Admitting Physician: Ambrose Pancoast  Attending Physician: MODY, Patricia Pesa [[id number]]  Estimated length of stay: past midnight tomorrow  Certification:: I certify this patient will need inpatient services for at least 2 midnights  PT Class (Do Not Modify): Inpatient [101]  PT Acc Code (Do Not Modify): Private [1]       B Medical/Surgery History Past Medical History:  Diagnosis Date  . Arthritis   . Asthma   . Diabetes mellitus   . GERD (gastroesophageal reflux disease)   . Hyperlipidemia   . Hypertension   . Mini stroke (HCC)   . Stroke [facility])    Past Surgical History:  Procedure Laterality Date  . ABDOMINAL HYSTERECTOMY    .  BREAST EXCISIONAL BIOPSY Left 1980's   benign  . CARDIAC CATHETERIZATION    . CHOLECYSTECTOMY       A IV Location/Drains/Wounds Patient Lines/Drains/Airways Status   Active Line/Drains/Airways    Name:   Placement date:   Placement time:   Site:   Days:   Peripheral IV 01/07/19 Right Wrist   01/07/19    1303    Wrist   less than 1          Intake/Output Last 24 hours No intake or output data in the 24 hours ending 01/07/19 1452  Labs/Imaging Results for orders placed or performed during the hospital encounter of 01/07/19 (from the past 48 hour(s))  Glucose, capillary     Status: Abnormal   Collection Time: 01/07/19 11:13 AM  Result Value Ref Range   Glucose-Capillary 196 (H) 70 - 99 mg/dL  Protime-INR     Status: None   Collection Time: 01/07/19 11:15 AM  Result Value Ref Range   Prothrombin Time 12.5 11.4 - 15.2 seconds   INR 0.94     Comment: Performed at [facility], [address] [place], [place] [address]  APTT     Status: Abnormal   Collection Time: 01/07/19 11:15 AM  Result Value Ref Range   aPTT 37 (H) 24 - 36 seconds    Comment:        IF BASELINE aPTT IS ELEVATED, SUGGEST PATIENT RISK ASSESSMENT BE USED TO DETERMINE APPROPRIATE ANTICOAGULANT THERAPY. Performed at  [facility], [address] [place], [place] [address]   CBC     Status: None   Collection Time: 01/07/19 11:15 AM  Result Value Ref Range   WBC 8.1 4.0 - 10.5 K/uL   RBC 4.20 3.87 - 5.11 MIL/uL   Hemoglobin 12.5 12.0 - 15.0 g/dL   HCT 53.2 99.2 - 42.6 %   MCV 92.9 80.0 - 100.0 fL   MCH 29.8 26.0 - 34.0 pg   MCHC 32.1 30.0 - 36.0 g/dL   RDW 83.4 19.6 - 22.2 %   Platelets 265 150 - 400 K/uL   nRBC 0.0 0.0 - 0.2 %    Comment: Performed at [facility], [address] [place], [place] [address]  Differential     Status: None   Collection Time: 01/07/19 11:15 AM  Result Value Ref Range   Neutrophils Relative % 63 %   Neutro Abs 5.1 1.7 - 7.7 K/uL    Lymphocytes Relative 25 %   Lymphs Abs 2.0 0.7 - 4.0 K/uL   Monocytes Relative 5 %   Monocytes Absolute 0.4 0.1 - 1.0 K/uL   Eosinophils Relative 6 %   Eosinophils Absolute 0.5 0.0 - 0.5 K/uL   Basophils Relative 1 %   Basophils Absolute 0.1 0.0 - 0.1 K/uL   Immature Granulocytes 0 %   Abs Immature Granulocytes 0.03 0.00 - 0.07 K/uL    Comment: Performed at [facility], [address] [place], [place] [address]  Comprehensive metabolic panel     Status: Abnormal   Collection Time: 01/07/19 11:15 AM  Result Value Ref Range   Sodium 137 135 - 145 mmol/L   Potassium 3.9 3.5 - 5.1 mmol/L   Chloride 102 98 - 111 mmol/L   CO2 26 22 - 32 mmol/L   Glucose, Bld 216 (H) 70 - 99 mg/dL   BUN 14 8 - 23 mg/dL   Creatinine, Ser 1.74 0.44 - 1.00 mg/dL   Calcium 9.3 8.9 - 08.1 mg/dL   Total Protein 7.1 6.5 - 8.1 g/dL   Albumin 3.9 3.5 - 5.0 g/dL   AST 17 15 - 41 U/L   ALT 11 0 - 44 U/L   Alkaline Phosphatase 77 38 - 126 U/L   Total Bilirubin 0.6 0.3 - 1.2 mg/dL   GFR calc non Af Amer >60 >60 mL/min   GFR calc Af Amer >60 >60 mL/min   Anion gap 9 5 - 15    Comment: Performed at [facility], [address] [place], [place] [address]   Ct Head Wo Contrast  Result Date: 01/07/2019 CLINICAL DATA:  68 year old female with a history of headache EXAM: CT HEAD WITHOUT CONTRAST TECHNIQUE: Contiguous axial images were obtained from the base of the skull through the vertex without intravenous contrast. COMPARISON:  11/24/2017, MR 12/16/2017 FINDINGS: Brain: No acute intracranial hemorrhage. No midline shift or mass effect. Gray-white differentiation maintained. Unremarkable appearance of the ventricular system. Vascular: Unremarkable. Skull: No acute fracture.  No aggressive bone lesion identified. Sinuses/Orbits: Unremarkable appearance of the orbits. Mastoid air cells clear. No middle ear effusion. No significant sinus disease. Other: None IMPRESSION: Negative head CT Electronically  Signed   By: Gilmer Mor D.O.   On: 01/07/2019 11:34    Pending Labs Unresulted Labs (From admission, onward)    Start     Ordered   01/14/19 0500  Creatinine, serum  (enoxaparin (LOVENOX)    CrCl >/= 30 ml/min)  Weekly,   STAT  Comments:  while on enoxaparin therapy    01/07/19 1306   01/08/19 0500  Hemoglobin A1c  Tomorrow morning,   STAT     01/07/19 1306   01/08/19 0500  Lipid panel  Tomorrow morning,   STAT    Comments:  Fasting    01/07/19 1306   01/07/19 1304  HIV antibody (Routine Testing)  Once,   STAT     01/07/19 1306   01/07/19 1304  CBC  (enoxaparin (LOVENOX)    CrCl >/= 30 ml/min)  Once,   STAT    Comments:  Baseline for enoxaparin therapy IF NOT ALREADY DRAWN.  Notify MD if PLT < 100 K.    01/07/19 1306   01/07/19 1304  Creatinine, serum  (enoxaparin (LOVENOX)    CrCl >/= 30 ml/min)  Once,   STAT    Comments:  Baseline for enoxaparin therapy IF NOT ALREADY DRAWN.    01/07/19 1306          Vitals/Pain Today's Vitals   01/07/19 1300 01/07/19 1330 01/07/19 1400 01/07/19 1430  BP: (!) 154/83 122/73 (!) 164/87 (!) 148/88  Pulse: 71 69 70 67  Resp: 17 16 (!) 21 16  Temp:      TempSrc:      SpO2: 99% 99% 99% 99%  Weight:      Height:      PainSc:        Isolation Precautions No active isolations  Medications Medications  sodium chloride flush (NS) 0.9 % injection 3 mL (3 mLs Intravenous Not Given 01/07/19 1354)  simvastatin (ZOCOR) tablet 40 mg (has no administration in time range)   stroke: mapping our early stages of recovery book (has no administration in time range)  0.9 %  sodium chloride infusion (has no administration in time range)  acetaminophen (TYLENOL) tablet 650 mg (has no administration in time range)    Or  acetaminophen (TYLENOL) solution 650 mg (has no administration in time range)    Or  acetaminophen (TYLENOL) suppository 650 mg (has no administration in time range)  senna-docusate (Senokot-S) tablet 1 tablet (has no  administration in time range)  enoxaparin (LOVENOX) injection 40 mg (has no administration in time range)  insulin aspart (novoLOG) injection 0-9 Units (has no administration in time range)  clopidogrel (PLAVIX) tablet 75 mg (has no administration in time range)  pantoprazole (PROTONIX) EC tablet 40 mg (has no administration in time range)  aspirin EC tablet 81 mg (has no administration in time range)  ketorolac (TORADOL) tablet 10 mg (has no administration in time range)  butalbital-acetaminophen-caffeine (FIORICET, ESGIC) 50-325-40 MG per tablet 1-2 tablet (has no administration in time range)  ondansetron (ZOFRAN-ODT) disintegrating tablet 4 mg (has no administration in time range)  LORazepam (ATIVAN) injection 1 mg (1 mg Intravenous Given 01/07/19 1351)  aspirin chewable tablet 324 mg (324 mg Oral Given 01/07/19 1439)    Mobility walks with person assist Low fall risk   Focused Assessments Neuro Assessment Handoff:  Swallow screen pass? Yes  Cardiac Rhythm: Normal sinus rhythm NIH Stroke Scale ( + Modified Stroke Scale Criteria)  Interval: Initial Level of Consciousness (1a.)   : Alert, keenly responsive LOC Questions (1b. )   +: Answers one question correctly LOC Commands (1c. )   + : Performs one task correctly Best Gaze (2. )  +: Normal Visual (3. )  +: No visual loss Facial Palsy (4. )    : Normal symmetrical movements Motor Arm, Left (5a. )   +:  No drift Motor Arm, Right (5b. )   +: No drift Motor Leg, Left (6a. )   +: Drift Motor Leg, Right (6b. )   +: No effort against gravity Limb Ataxia (7. ): Absent Sensory (8. )   +: Normal, no sensory loss Best Language (9. )   +: Mild-to-moderate aphasia Dysarthria (10. ): Mild-to-moderate dysarthria, patient slurs at least some words and, at worst, can be understood with some difficulty Extinction/Inattention (11.)   +: No Abnormality Modified SS Total  +: 7 Complete NIHSS TOTAL: 8 Last date known well: 01/06/19 Last time  known well: (unknown) Neuro Assessment: Exceptions to WDL Neuro Checks:   Initial (01/07/19 1248)  Last Documented NIHSS Modified Score: 7 (01/07/19 1248) Has TPA been given? No If patient is a Neuro Trauma and patient is going to OR before floor call report to 4N Charge nurse: [phone] or [phone]     R Recommendations: See Admitting Provider Note  Report given to:   Additional Notes:

## 2019-01-08 ENCOUNTER — Inpatient Hospital Stay: Payer: Medicare Other

## 2019-01-08 ENCOUNTER — Encounter: Payer: Self-pay | Admitting: Radiology

## 2019-01-08 DIAGNOSIS — G459 Transient cerebral ischemic attack, unspecified: Secondary | ICD-10-CM

## 2019-01-08 LAB — GLUCOSE, CAPILLARY
Glucose-Capillary: 139 mg/dL — ABNORMAL HIGH (ref 70–99)
Glucose-Capillary: 125 mg/dL — ABNORMAL HIGH (ref 70–99)
Glucose-Capillary: 106 mg/dL — ABNORMAL HIGH (ref 70–99)
Glucose-Capillary: 146 mg/dL — ABNORMAL HIGH (ref 70–99)

## 2019-01-08 LAB — LIPID PANEL
Cholesterol: 153 mg/dL (ref 0–200)
HDL: 50 mg/dL (ref >40)
LDL Cholesterol: 85 mg/dL (ref 0–99)
Total CHOL/HDL Ratio: 3.1 RATIO
Triglycerides: 89 mg/dL (ref <150)
VLDL: 18 mg/dL (ref 0–40)

## 2019-01-08 LAB — HEMOGLOBIN A1C
Hgb A1c MFr Bld: 7.4 % — ABNORMAL HIGH (ref 4.8–5.6)
Mean Plasma Glucose: 165.68 mg/dL

## 2019-01-08 MED ORDER — ATORVASTATIN CALCIUM 20 MG PO TABS
40.0000 mg | ORAL_TABLET | Freq: Every day | ORAL | Status: DC
  Administered 2019-01-08: 40 mg via ORAL
  Filled 2019-01-08: qty 2

## 2019-01-08 MED ORDER — ENSURE MAX PROTEIN PO LIQD
11.0000 [oz_av] | Freq: Two times a day (BID) | ORAL | Status: DC
  Administered 2019-01-08 – 2019-01-09 (×2): 11 [oz_av] via ORAL

## 2019-01-08 MED ORDER — MAGNESIUM SULFATE 2 GM/50ML IV SOLN
2.0000 g | Freq: Once | INTRAVENOUS | Status: AC
  Administered 2019-01-08: 2 g via INTRAVENOUS
  Filled 2019-01-08: qty 50

## 2019-01-08 MED ORDER — MECLIZINE HCL 12.5 MG PO TABS
12.5000 mg | ORAL_TABLET | Freq: Three times a day (TID) | ORAL | Status: DC
  Administered 2019-01-08 – 2019-01-09 (×3): 12.5 mg via ORAL
  Filled 2019-01-08 (×5): qty 1

## 2019-01-08 MED ORDER — IOHEXOL 350 MG/ML SOLN
75.0000 mL | Freq: Once | INTRAVENOUS | Status: AC | PRN
  Administered 2019-01-08: 75 mL via INTRAVENOUS

## 2019-01-08 MED ORDER — ADULT MULTIVITAMIN W/MINERALS CH
1.0000 | ORAL_TABLET | Freq: Every day | ORAL | Status: DC
  Administered 2019-01-08: 1 via ORAL
  Filled 2019-01-08: qty 1

## 2019-01-08 NOTE — Consult Note (Addendum)
Referring Physician: Alford Highland, MD    Chief Complaint: Dizziness and word finding difficulties  HPI: Destiny Savage is an 68 y.o. female with past medical history of diabetes mellitus, hypertension, hyperlipidemia, CVA, asthma, and sleep apnea presenting to the ED on 01/07/2019 with complaints of dizziness, word finding difficulty and confusion.  Patient reports that she just did not " feel right yesterday feels like a rubber band is squeezing my head". Patient's husband also noted that her speech was slow and she was having difficulty expressing herself. She reports onset of symptoms since 01/06/2019 at 1800 and has progressively increased in frequency, intensity and severit so she decided to come in for further evaluation. She denies associated altered sensorium, cranial nerve deficit, seizure like activity, focal motor or sensory deficits, diplopia, nausea or vomiting, syncope or LOC, paresthesia (numbness, tingling, pins-and-needles sensation) or a heavy feeling in an extremity. On arrival to the ED, there were no focal neurological deficits; she was alert and oriented x4, however she did demonstrate some difficulty with word finding and ability to process information.  Initial NIH stroke scale 8.  A non-contrast head CT showed no acute intracranial abnormality.  Follow-up MRI of the head showed questionable abnormal signal is C4 vertebral body otherwise negative; MRA of the head was also unremarkable however with questionawble abnormal signal C4 vertebral body.  MRI cervical spine showed market degenerative endplate signal change at C4-5 and progression of spondylolysis at C3-4, C4-5, and C5-6.  Moderate to moderately severe foraminal narrowing at each of the levels was on the left.  Date last known well: Date: 01/06/2019 Time last known well: Time: 18:00 tPA Given: No: outside window period  Past Medical History:  Diagnosis Date  . Arthritis   . Asthma   . Diabetes mellitus   . GERD  (gastroesophageal reflux disease)   . Hyperlipidemia   . Hypertension   . Mini stroke (HCC)   . Stroke [facility])     Past Surgical History:  Procedure Laterality Date  . ABDOMINAL HYSTERECTOMY    . BREAST EXCISIONAL BIOPSY Left 1980's   benign  . CARDIAC CATHETERIZATION    . CHOLECYSTECTOMY      Family History  Problem Relation Age of Onset  . Heart Problems Father   . Hypertension Other   . CAD Other    Social History:  reports that she has never smoked. She has never used smokeless tobacco. She reports that she does not drink alcohol or use drugs.  Allergies:  Allergies  Allergen Reactions  . Latex Hives  . Lidocaine Hives  . Pb-Hyoscy-Atropine-Scopolamine Hives and Rash    Medications:  I have reviewed the patient's current medications. Prior to Admission:  Medications Prior to Admission  Medication Sig Dispense Refill Last Dose  . aspirin 81 MG tablet Take 1 tablet (81 mg total) by mouth daily.   01/07/2019 at 0900  . carvedilol (COREG) 6.25 MG tablet Take 6.25 mg by mouth 2 (two) times daily with a meal.   01/07/2019 at 0900  . clopidogrel (PLAVIX) 75 MG tablet Take 75 mg by mouth daily.   01/07/2019 at 0900  . furosemide (LASIX) 20 MG tablet TAKE 1 TABLET BY MOUTH ONCE DAILY 30 tablet 0 01/07/2019 at 0900  . metFORMIN (GLUCOPHAGE) 500 MG tablet Take 1,000 mg by mouth daily.   01/07/2019 at 0900  . omeprazole (PRILOSEC) 20 MG capsule Take 20 mg by mouth daily.   01/07/2019 at 0900  . potassium chloride (K-DUR,KLOR-CON) 10 MEQ tablet  Take 10 mEq by mouth 2 (two) times daily.   01/06/2019 at 0900  . simvastatin (ZOCOR) 40 MG tablet Take 40 mg by mouth every evening.   01/06/2019 at 1800  . albuterol (PROAIR HFA) 108 (90 Base) MCG/ACT inhaler Inhale 1 puff into the lungs every 6 (six) hours as needed for wheezing.   prn at prn  . butalbital-acetaminophen-caffeine (FIORICET, ESGIC) 50-325-40 MG tablet Take 1-2 tablets by mouth every 6 (six) hours as needed for headache. 20 tablet 0  prn at prn  . erythromycin ophthalmic ointment Place 1 application into both eyes daily.   prn at prn  . fluticasone (FLONASE) 50 MCG/ACT nasal spray Place 1 spray into both nostrils daily.   prn at prn  . ketorolac (TORADOL) 10 MG tablet Take 1 tablet (10 mg total) by mouth every 8 (eight) hours as needed for moderate pain (with food). 15 tablet 0 prn at prn  . ondansetron (ZOFRAN-ODT) 4 MG disintegrating tablet Take 4 mg by mouth every 8 (eight) hours as needed for nausea.   prn at prn  . predniSONE (STERAPRED UNI-PAK 21 TAB) 10 MG (21) TBPK tablet Take 6 tabs on day 1 Take 5 tabs on day 2 Take 4 tabs on day 3 Take 3 tabs on day 4 Take 2 tabs on day 5 Take 1 tab on day 6 (Patient not taking: Reported on 01/07/2019) 21 tablet 0 Completed Course at Unknown time  . traMADol (ULTRAM) 50 MG tablet Take 1 tablet (50 mg total) by mouth every 12 (twelve) hours as needed. 12 tablet 0 prn at prn  . triamcinolone cream (KENALOG) 0.5 % Apply 1 application topically 2 (two) times daily.   prn at prn   Scheduled: . aspirin EC  81 mg Oral Daily  . clopidogrel  75 mg Oral Daily  . enoxaparin (LOVENOX) injection  40 mg Subcutaneous Q24H  . insulin aspart  0-9 Units Subcutaneous TID WC  . pantoprazole  40 mg Oral Daily  . simvastatin  40 mg Oral QPM  . sodium chloride flush  3 mL Intravenous Once    ROS: History obtained from the patient   General ROS: negative for - chills, fatigue, fever, night sweats, weight gain or weight loss Psychological ROS: negative for - behavioral disorder, hallucinations, memory difficulties, mood swings or suicidal ideation Ophthalmic ROS: negative for - blurry vision, double vision, eye pain or loss of vision ENT ROS: negative for - epistaxis, nasal discharge, oral lesions, sore throat, tinnitus or vertigo Allergy and Immunology ROS: negative for - hives or itchy/watery eyes Hematological and Lymphatic ROS: negative for - bleeding problems, bruising or swollen lymph  nodes Endocrine ROS: negative for - galactorrhea, hair pattern changes, polydipsia/polyuria or temperature intolerance Respiratory ROS: negative for - cough, hemoptysis, shortness of breath or wheezing Cardiovascular ROS: negative for - chest pain, dyspnea on exertion, edema or irregular heartbeat Gastrointestinal ROS: negative for - abdominal pain, diarrhea, hematemesis, nausea/vomiting or stool incontinence Genito-Urinary ROS: negative for - dysuria, hematuria, incontinence or urinary frequency/urgency Musculoskeletal ROS: negative for - joint swelling or muscular weakness Neurological ROS: as noted in HPI Dermatological ROS: negative for rash and skin lesion changes  Physical Examination: Blood pressure (!) 152/84, pulse 73, temperature 97.8 F (36.6 C), temperature source Oral, resp. rate 16, height 5\' 9"  (1.753 m), weight 90.7 kg, SpO2 97 %.   HEENT-  Normocephalic, no lesions, without obvious abnormality.  Normal external eye and conjunctiva.  Normal TM's bilaterally.  Normal auditory canals and  external ears. Normal external nose, mucus membranes and septum.  Normal pharynx. Cardiovascular- S1, S2 normal, pulses palpable throughout   Lungs- chest clear, no wheezing, rales, normal symmetric air entry Abdomen- soft, non-tender; bowel sounds normal; no masses,  no organomegaly Extremities- no edema Lymph-no adenopathy palpable Musculoskeletal-no joint tenderness, deformity or swelling Skin-warm and dry, no hyperpigmentation, vitiligo, or suspicious lesions  Neurological Exam   Mental Status: Alert, oriented, thought content appropriate.  Speech fluent without evidence of aphasia.  Able to follow 3 step commands without difficulty. Attention span and concentration seemed appropriate  Cranial Nerves: II: Discs flat bilaterally; Visual fields grossly normal, pupils equal, round, reactive to light and accommodation III,IV, VI: ptosis not present, extra-ocular motions intact  bilaterally V,VII: smile symmetric, facial light touch sensation intact VIII: hearing normal bilaterally IX,X: gag reflex present XI: bilateral shoulder shrug XII: midline tongue extension Motor: Right :  Upper extremity   5/5 Without pronator drift      Left: Upper extremity   5/5 without pronator drift Right:   Lower extremity   5/5                                          Left: Lower extremity   5/5 Tone and bulk:normal tone throughout; no atrophy noted Sensory: Pinprick and light touch intact bilaterally Deep Tendon Reflexes: 2+ and symmetric with absent AJ's bilaterally Plantars: Right: mute                              Left: mute Cerebellar: Finger-to-nose testing intact bilaterally. Heel to shin testing normal bilaterally Gait: not tested due to safety concerns  Data Reviewed  Laboratory Studies:  Basic Metabolic Panel: Recent Labs  Lab 01/07/19 1115  NA 137  K 3.9  CL 102  CO2 26  GLUCOSE 216*  BUN 14  CREATININE 0.79  CALCIUM 9.3    Liver Function Tests: Recent Labs  Lab 01/07/19 1115  AST 17  ALT 11  ALKPHOS 77  BILITOT 0.6  PROT 7.1  ALBUMIN 3.9   No results for input(s): LIPASE, AMYLASE in the last 168 hours. No results for input(s): AMMONIA in the last 168 hours.  CBC: Recent Labs  Lab 01/07/19 1115  WBC 8.1  NEUTROABS 5.1  HGB 12.5  HCT 39.0  MCV 92.9  PLT 265    Cardiac Enzymes: No results for input(s): CKTOTAL, CKMB, CKMBINDEX, TROPONINI in the last 168 hours.  BNP: Invalid input(s): POCBNP  CBG: Recent Labs  Lab 01/07/19 1113 01/07/19 1656 01/07/19 2103 01/08/19 0748 01/08/19 1221  GLUCAP 196* 102* 125* 139* 125*    Microbiology: No results found for this or any previous visit.  Coagulation Studies: Recent Labs    01/07/19 1115  LABPROT 12.5  INR 0.94    Urinalysis: No results for input(s): COLORURINE, LABSPEC, PHURINE, GLUCOSEU, HGBUR, BILIRUBINUR, KETONESUR, PROTEINUR, UROBILINOGEN, NITRITE, LEUKOCYTESUR in  the last 168 hours.  Invalid input(s): APPERANCEUR  Lipid Panel:    Component Value Date/Time   CHOL 153 01/08/2019 0443   CHOL 191 07/10/2014 0235   TRIG 89 01/08/2019 0443   TRIG 105 07/10/2014 0235   HDL 50 01/08/2019 0443   HDL 88 (H) 07/10/2014 0235   CHOLHDL 3.1 01/08/2019 0443   VLDL 18 01/08/2019 0443   VLDL 21 07/10/2014 0235   LDLCALC 85 01/08/2019  0443   LDLCALC 82 07/10/2014 0235    HgbA1C:  Lab Results  Component Value Date   HGBA1C 7.4 (H) 01/08/2019    Urine Drug Screen:      Component Value Date/Time   LABOPIA NEGATIVE 04/07/2012 1108   COCAINSCRNUR NEGATIVE 04/07/2012 1108   LABBENZ POSITIVE 04/07/2012 1108   AMPHETMU NEGATIVE 04/07/2012 1108   THCU NEGATIVE 04/07/2012 1108   LABBARB NEGATIVE 04/07/2012 1108    Alcohol Level: No results for input(s): ETH in the last 168 hours.  Other results: EKG: normal EKG, normal sinus rhythm, unchanged from previous tracings.  Vent. rate 73 BPM PR interval 154 ms QRS duration 80 ms QT/QTc 390/429 ms P-R-T axes 46 36 27  Imaging: Ct Head Wo Contrast  Result Date: 01/07/2019 CLINICAL DATA:  68 year old female with a history of headache EXAM: CT HEAD WITHOUT CONTRAST TECHNIQUE: Contiguous axial images were obtained from the base of the skull through the vertex without intravenous contrast. COMPARISON:  11/24/2017, MR 12/16/2017 FINDINGS: Brain: No acute intracranial hemorrhage. No midline shift or mass effect. Gray-white differentiation maintained. Unremarkable appearance of the ventricular system. Vascular: Unremarkable. Skull: No acute fracture.  No aggressive bone lesion identified. Sinuses/Orbits: Unremarkable appearance of the orbits. Mastoid air cells clear. No middle ear effusion. No significant sinus disease. Other: None IMPRESSION: Negative head CT Electronically Signed   By: Gilmer Mor D.O.   On: 01/07/2019 11:34   Mr Brain Wo Contrast  Result Date: 01/07/2019 CLINICAL DATA:  Dizziness since  yesterday, headache. History of hypertension, hyperlipidemia, diabetes and stroke. EXAM: MRI HEAD WITHOUT CONTRAST MRA HEAD WITHOUT CONTRAST TECHNIQUE: Multiplanar, multiecho pulse sequences of the brain and surrounding structures were obtained without intravenous contrast. Angiographic images of the head were obtained using MRA technique without contrast. COMPARISON:  CT HEAD January 07, 2019 and MRI of the head December 16, 2017 and MRA head January 11, 2017. FINDINGS: MRI HEAD FINDINGS INTRACRANIAL CONTENTS: No reduced diffusion to suggest acute ischemia. No susceptibility artifact to suggest hemorrhage. No parenchymal brain volume loss for age. No hydrocephalus. Faint nonspecific supratentorial white matter FLAIR T2 hyperintensities compatible with early chronic small vessel ischemic changes, normal for age. No suspicious parenchymal signal, masses, mass effect. No abnormal extra-axial fluid collections. No extra-axial masses. VASCULAR: Normal major intracranial vascular flow voids present at skull base. SKULL AND UPPER CERVICAL SPINE: No abnormal sellar expansion. Low signal C 4 vertebral body seen only on sagittal T1 sequence, nonoccluded on prior MRI. Craniocervical junction maintained. SINUSES/ORBITS: Minimal ethmoid mucosal thickening. No paranasal sinus air-fluid levels. Mastoid air cells are well aerated.The included ocular globes and orbital contents are non-suspicious. OTHER: None. MRA HEAD FINDINGS ANTERIOR CIRCULATION: Normal flow related enhancement of the included cervical, petrous, cavernous and supraclinoid internal carotid arteries. Patent anterior communicating artery. Patent anterior and middle cerebral arteries. No large vessel occlusion, flow limiting stenosis, aneurysm. POSTERIOR CIRCULATION: Codominant vertebral arteries. Vertebrobasilar arteries are patent, with normal flow related enhancement of the main branch vessels. Patent posterior cerebral arteries. Predominately fetal origin  bilateral posterior cerebral arteries. No large vessel occlusion, flow limiting stenosis,  aneurysm. ANATOMIC VARIANTS: None. Source images and MIP images were reviewed. IMPRESSION: MRI HEAD: 1. No acute intracranial process; negative noncontrast MRI head for age. 2. Abnormal signal C4 vertebral body may be artifact; consider non emergent CT versus MRI cervical spine. MRA HEAD: 1. No emergent large vessel occlusion or flow-limiting stenosis. Electronically Signed   By: Awilda Metro M.D.   On: 01/07/2019 22:42   Mr Cervical  Spine Wo Contrast  Result Date: 01/08/2019 CLINICAL DATA:  Dizziness. History of prior stroke. Abnormal signal is seen in the C4 vertebral body on brain MRI yesterday. EXAM: MRI CERVICAL SPINE WITHOUT CONTRAST TECHNIQUE: Multiplanar, multisequence MR imaging of the cervical spine was performed. No intravenous contrast was administered. COMPARISON:  Cervical spine MRI 04/19/2012.  Brain MRI 01/07/2019. FINDINGS: Alignment: Straightening of lordosis is unchanged. Vertebrae: There is no fracture. Since the prior cervical MRI, the patient has developed marked degenerative endplate signal change at C4-5 with small Schmorl's nodes in the endplates. Additional scattered small Schmorl's nodes are noted. Cord: Normal signal throughout. Posterior Fossa, vertebral arteries, paraspinal tissues: Negative. Disc levels: C2-3: Minimal posterior bony ridging and mild facet degenerative disease. No stenosis. C3-4: There is some ligamentum flavum thickening, a shallow disc bulge and some uncovertebral spurring. Mild central canal narrowing is present. Moderate to moderately severe foraminal narrowing is worse on the right. Spondylosis shows some progression since the prior MRI. C4-5: There is a shallow disc bulge and uncovertebral disease. The ventral thecal sac is effaced. Moderate to moderately severe foraminal narrowing is worse on the left. Spondylosis has progressed since the prior exam. C5-6: Minimal  disc bulge and left worse than right uncovertebral disease. The central canal is open. Moderate to moderately severe foraminal narrowing is worse on the left. Spondylosis has progressed since the prior exam. C6-7: There is a shallow broad-based disc bulge and some uncovertebral disease. Ligamentum flavum thickening is noted. Mild deformity of the ventral cord is more notable on the left. Mild bilateral foraminal narrowing is present. The appearance is not notably changed. C7-T1: There is a shallow disc bulge and some uncovertebral disease. Ligamentum flavum thickening is seen. The central canal is open. Mild to moderate foraminal narrowing is worse on the left. The appearance is unchanged. IMPRESSION: Marked degenerative endplate signal change at C4-5 accounts for signal abnormality seen on brain MRI yesterday and is new since the patient's 2013 cervical spine MRI. Some progression of spondylosis at C3-4, C4-5 and C5-6. Moderate to moderately severe foraminal narrowing at each of these levels is worse on the left. Mild central canal narrowing is present at C3-4 and C4-5. Mild deformity of the ventral cord at C6-7 and mild bilateral foraminal narrowing are unchanged. Electronically Signed   By: Drusilla Kannerhomas  Dalessio M.D.   On: 01/08/2019 11:54   [place] Carotid Bilateral (at [facility] Only)  Result Date: 01/07/2019 CLINICAL DATA:  68 year old female with a history of cerebrovascular accident EXAM: BILATERAL CAROTID DUPLEX ULTRASOUND TECHNIQUE: Wallace CullensGray scale imaging, color Doppler and duplex ultrasound were performed of bilateral carotid and vertebral arteries in the neck. COMPARISON:  01/11/2017 FINDINGS: Criteria: Quantification of carotid stenosis is based on velocity parameters that correlate the residual internal carotid diameter with NASCET-based stenosis levels, using the diameter of the distal internal carotid lumen as the denominator for stenosis measurement. The following velocity measurements were obtained: RIGHT  ICA:  Systolic 80 cm/sec, Diastolic 29 cm/sec CCA:  47 cm/sec SYSTOLIC ICA/CCA RATIO:  1.7 ECA:  50 cm/sec LEFT ICA:  Systolic 99 cm/sec, Diastolic 37 cm/sec CCA:  58 cm/sec SYSTOLIC ICA/CCA RATIO:  1.2 ECA:  117 cm/sec Right Brachial SBP: Not acquired Left Brachial SBP: Not acquired RIGHT CAROTID ARTERY: No significant calcifications of the right common carotid artery. Intermediate waveform maintained. Moderate heterogeneous and partially calcified plaque at the right carotid bifurcation. No significant lumen shadowing. Low resistance waveform of the right ICA. No significant tortuosity. RIGHT VERTEBRAL ARTERY: Antegrade flow with  low resistance waveform. LEFT CAROTID ARTERY: No significant calcifications of the left common carotid artery. Intermediate waveform maintained. Moderate heterogeneous and partially calcified plaque at the left carotid bifurcation. No significant lumen shadowing. Low resistance waveform of the left ICA. No significant tortuosity. LEFT VERTEBRAL ARTERY:  Antegrade flow with low resistance waveform. IMPRESSION: Color duplex indicates moderate heterogeneous and calcified plaque, with no hemodynamically significant stenosis by duplex criteria in the extracranial cerebrovascular circulation. Signed, Yvone Neu. Reyne Dumas, RPVI [facility] Electronically Signed   By: Gilmer Mor D.O.   On: 01/07/2019 16:42   Mr Maxine Glenn Head/brain ZO Cm  Result Date: 01/07/2019 CLINICAL DATA:  Dizziness since yesterday, headache. History of hypertension, hyperlipidemia, diabetes and stroke. EXAM: MRI HEAD WITHOUT CONTRAST MRA HEAD WITHOUT CONTRAST TECHNIQUE: Multiplanar, multiecho pulse sequences of the brain and surrounding structures were obtained without intravenous contrast. Angiographic images of the head were obtained using MRA technique without contrast. COMPARISON:  CT HEAD January 07, 2019 and MRI of the head December 16, 2017 and MRA head  January 11, 2017. FINDINGS: MRI HEAD FINDINGS INTRACRANIAL CONTENTS: No reduced diffusion to suggest acute ischemia. No susceptibility artifact to suggest hemorrhage. No parenchymal brain volume loss for age. No hydrocephalus. Faint nonspecific supratentorial white matter FLAIR T2 hyperintensities compatible with early chronic small vessel ischemic changes, normal for age. No suspicious parenchymal signal, masses, mass effect. No abnormal extra-axial fluid collections. No extra-axial masses. VASCULAR: Normal major intracranial vascular flow voids present at skull base. SKULL AND UPPER CERVICAL SPINE: No abnormal sellar expansion. Low signal C 4 vertebral body seen only on sagittal T1 sequence, nonoccluded on prior MRI. Craniocervical junction maintained. SINUSES/ORBITS: Minimal ethmoid mucosal thickening. No paranasal sinus air-fluid levels. Mastoid air cells are well aerated.The included ocular globes and orbital contents are non-suspicious. OTHER: None. MRA HEAD FINDINGS ANTERIOR CIRCULATION: Normal flow related enhancement of the included cervical, petrous, cavernous and supraclinoid internal carotid arteries. Patent anterior communicating artery. Patent anterior and middle cerebral arteries. No large vessel occlusion, flow limiting stenosis, aneurysm. POSTERIOR CIRCULATION: Codominant vertebral arteries. Vertebrobasilar arteries are patent, with normal flow related enhancement of the main branch vessels. Patent posterior cerebral arteries. Predominately fetal origin bilateral posterior cerebral arteries. No large vessel occlusion, flow limiting stenosis,  aneurysm. ANATOMIC VARIANTS: None. Source images and MIP images were reviewed. IMPRESSION: MRI HEAD: 1. No acute intracranial process; negative noncontrast MRI head for age. 2. Abnormal signal C4 vertebral body may be artifact; consider non emergent CT versus MRI cervical spine. MRA HEAD: 1. No emergent large vessel occlusion or flow-limiting stenosis.  Electronically Signed   By: Awilda Metro M.D.   On: 01/07/2019 22:42   Patient seen and examined.  Clinical course and management discussed.  Necessary edits performed.  I agree with the above.  Assessment and plan of care developed and discussed below.    Assessment: 68 y.o. female with past medical history of diabetes mellitus, hypertension, hyperlipidemia, CVA, asthma, and sleep apnea presenting to the ED on 01/07/2019 with complaints of dizziness, word finding difficulty and confusion. Concern is for acute infarct/TIA with patient having multiple vascular risk factors.  Patient appears at baseline today.  Head CT reviewed and shows no acute changes.  MRI of the brain reviewed as well and unremarkable.  There is some concern for C4 pathology but this would not be the etiology for the patient's presenting symptoms.  Patient on ASA and Plavix prior to presentation.   Carotid dopplers show evidence of moderate plaque  bilaterally.  Echocardiogram results pending.  A1c 7.4, LDL 85.  Stroke Risk Factors - diabetes mellitus, family history, hyperlipidemia and hypertension  Plan: 1. CTA neck with and without contrast 2. PT consult, OT consult, Speech consult 3. Echocardiogram pending 4. Continue dual therapy Aspirin 81 mg/day and Plavix 75 mg /day  5. Aggressive lipid management with goal low density lipoprotein (LDL) <70 mg/dl,  6. Blood sugar management with target A1c<7.0 7. Telemetry monitoring 8. Frequent neuro checks 9. MRI of the cervical spine to follow up possible abnormality noted on MR of the head.     This patient was staffed with Dr. Verlon Au, Thad Ranger who personally evaluated patient, reviewed documentation and agreed with assessment and plan of care as above.  Webb Silversmith, DNP, FNP-BC Board certified Nurse Practitioner Neurology Department  01/08/2019, 1:12 PM  Thana Farr, MD Neurology [phone]  01/08/2019  2:15 PM

## 2019-01-08 NOTE — Evaluation (Signed)
Occupational Therapy Evaluation Patient Details Name: Destiny Savage MRN: [id number] DOB: 1951-10-12 Today's Date: 01/08/2019    History of Present Illness Destiny Savage is a 68yo female who comes to [facility] On 2/20 after wosening of 69M HA, dizziness, and difficulty with her words. PMH: DM, HTN , CVA (Feb 2018).    Clinical Impression   Pt seen for OT evaluation this date. Prior to hospital admission, pt was independent, living with her spouse in a 1 story home with 3-4 steps. Pt eager to return home. Pt denies dizziness or pain. Pt able to sit EOB and perform UB/LB sponge bathing and dressing with set up and supervision, requiring CGA with standing. Pt provided cues for safety intermittently t/o session. Currently pt demonstrates impairments in balance and safety awareness requiring sup-CGA assist for LB ADL and functional mobility for ADL. Pt would benefit from skilled OT to address noted impairments and functional limitations (see below for any additional details) in order to maximize safety and independence while minimizing falls risk and caregiver burden. Upon hospital discharge, recommend pt discharge with [facility] services.    Follow Up Recommendations  Home health OT;Supervision - Intermittent    Equipment Recommendations  None recommended by OT    Recommendations for Other Services       Precautions / Restrictions Precautions Precautions: Fall Restrictions Weight Bearing Restrictions: No      Mobility Bed Mobility Overal bed mobility: Modified Independent                Transfers Overall transfer level: Needs assistance   Transfers: Sit to/from Stand Sit to Stand: Supervision         General transfer comment: slow, labored, but steady, uses fixed objects nearby for support    Balance Overall balance assessment: Modified Independent;History of Falls(intermittent railing use)                                         ADL either performed or  assessed with clinical judgement   ADL Overall ADL's : Needs assistance/impaired                                       General ADL Comments: set up and supervision for seated bathing/dressing, CGA when in standing     Vision Patient Visual Report: No change from baseline       Perception     Praxis      Pertinent Vitals/Pain Pain Assessment: No/denies pain     Hand Dominance Left   Extremity/Trunk Assessment Upper Extremity Assessment Upper Extremity Assessment: Overall WFL for tasks assessed   Lower Extremity Assessment Lower Extremity Assessment: Overall WFL for tasks assessed   Cervical / Trunk Assessment Cervical / Trunk Assessment: Normal   Communication Communication Communication: No difficulties   Cognition Arousal/Alertness: Awake/alert Behavior During Therapy: WFL for tasks assessed/performed Overall Cognitive Status: Within Functional Limits for tasks assessed                                 General Comments: mild decreased safety awareness    General Comments       Exercises Other Exercises Other Exercises: pt instructed in falls prevention strategies Other Exercises: pt instructed in home/routines modifications to maximize safety for  ADL tasks   Shoulder Instructions      Home Living Family/patient expects to be discharged to:: Private residence Living Arrangements: Spouse/significant other Available Help at Discharge: Family Type of Home: House Home Access: Stairs to enter [employer] of Steps: 3-4" over threshold   Home Layout: One level     Bathroom Shower/Tub: Chief Strategy Officer: Standard     Home Equipment: Environmental consultant - 2 wheels   Additional Comments: in past three months, several nearfall episodes from over reaching       Prior Functioning/Environment Level of Independence: Independent with assistive device(s)        Comments: reports being a community dweller         OT Problem List: Impaired balance (sitting and/or standing);Decreased safety awareness      OT Treatment/Interventions: Self-care/ADL training;Balance training;Therapeutic exercise;Therapeutic activities;DME and/or AE instruction;Energy conservation;Patient/family education    OT Goals(Current goals can be found in the care plan section) Acute Rehab OT Goals Patient Stated Goal: go home OT Goal Formulation: With patient Time For Goal Achievement: 01/22/19 Potential to Achieve Goals: Good ADL Goals Pt Will Transfer to Toilet: with supervision;ambulating(elevated commode, LRAD for amb) Additional ADL Goal #1: Pt will perform seated/STS UB/LB bathing and dressing with modified independence Additional ADL Goal #2: Pt will verbalize plan to implement at least 1 learned falls prevention strategy.  OT Frequency: Min 1X/week   Barriers to D/C:            Co-evaluation              AM-PAC OT "6 Clicks" Daily Activity     Outcome Measure Help from another person eating meals?: None Help from another person taking care of personal grooming?: None Help from another person toileting, which includes using toliet, bedpan, or urinal?: A Little Help from another person bathing (including washing, rinsing, drying)?: A Little Help from another person to put on and taking off regular upper body clothing?: None Help from another person to put on and taking off regular lower body clothing?: A Little 6 Click Score: 21   End of Session    Activity Tolerance: Patient tolerated treatment well Patient left: in bed;with call bell/phone within reach;Other (comment)(with transport to take to imaging)  OT Visit Diagnosis: History of falling (Z91.81);Unsteadiness on feet (R26.81)                Time: 2094-7096 OT Time Calculation (min): 33 min Charges:  OT General Charges $OT Visit: 1 Visit OT Evaluation $OT Eval Low Complexity: 1 Low OT Treatments $Self Care/Home Management : 23-37  mins  Richrd Prime, MPH, MS, OTR/L ascom [phone] 01/08/19, 4:36 PM

## 2019-01-08 NOTE — Progress Notes (Signed)
SLP Cancellation Note  Patient Details Name: Destiny Savage MRN: [id number] DOB: Sep 04, 1951   Cancelled treatment:       Reason Eval/Treat Not Completed: Patient not medically ready(chart reviewed; consulted NSG then met w/ pt/Husband ). Pt is tolerating her current regular consistency diet w/ no swallowing problems reported. Pt c/o min tension in the front of her head and difficulty "focusing" on what she wants to say "sometimes". She feels she has to "focus so hard" on what is being said during extended conversation that she sometimes forgets bits/pieces. She was able to converse during general conversation w/ SLP and Husband as well as a family member on the phone (noted upon entering room). Slight hesitation x2-3 during initiation of conversation noted. Encouraged pt to use strategies of Slowing Down and taking her time when talking w/ others; even Pausing and starting again, and Overarticulation during speaking. Discussed reducing distractions in the room during conversation as well. ST services will f/u tomorrow w/ pt's status; need for f/u at discharge possibly.  MRI results indicated "No acute intracranial process; negative noncontrast MRI head for age". Will monitor results of CT of Neck and Cervical Spine. Pt may benefit from f/u w/ formal cognitive-linguistic eval at discharge if any issues persist. Pt and Husband agreed. MD/NSG updated.      Orinda Kenner, MS, CCC-SLP Kaydan Wilhoite 01/08/2019, 10:24 AM

## 2019-01-08 NOTE — Progress Notes (Signed)
*  PRELIMINARY RESULTS* Echocardiogram 2D Echocardiogram has been performed.  Destiny Savage 01/08/2019, 7:51 AM 

## 2019-01-08 NOTE — Care Management (Signed)
To patient room for assessment; patient is currently off floor for procedure.

## 2019-01-08 NOTE — Progress Notes (Addendum)
Patient ID: Destiny Savage, female   DOB: 10/11/1951, 68 y.o.   MRN: [id number]  [employer] PROGRESS NOTE  Destiny Savage WUJ:[id number] DOB: 06-29-1951 DOA: 01/07/2019 PCP: Jaclyn Shaggy, MD  HPI/Subjective: Patient complains of dizziness.  She almost fell over.  Slight neck pain.  Having problems on and off with her speech knowing what she wants to say but trouble getting her words out.  Feels weak.  Her right leg gives out on her every once in a while.  Objective: Vitals:   01/08/19 0550 01/08/19 0813  BP: (!) 142/83 (!) 152/84  Pulse: 72 73  Resp: 18 16  Temp: 97.8 F (36.6 C)   SpO2: 98% 97%    Intake/Output Summary (Last 24 hours) at 01/08/2019 1602 Last data filed at 01/08/2019 0333 Gross per 24 hour  Intake 352.85 ml  Output 700 ml  Net -347.15 ml   Filed Weights   01/07/19 1108  Weight: 90.7 kg    ROS: Review of Systems  Constitutional: Positive for malaise/fatigue. Negative for chills and fever.  Eyes: Negative for blurred vision.  Respiratory: Negative for cough and shortness of breath.   Cardiovascular: Negative for chest pain.  Gastrointestinal: Negative for abdominal pain, constipation, diarrhea, nausea and vomiting.  Genitourinary: Negative for dysuria.  Musculoskeletal: Negative for joint pain.  Neurological: Positive for speech change and headaches. Negative for dizziness.   Exam: Physical Exam  Constitutional: She is oriented to person, place, and time.  HENT:  Nose: No mucosal edema.  Mouth/Throat: No oropharyngeal exudate or posterior oropharyngeal edema.  Eyes: Pupils are equal, round, and reactive to light. Conjunctivae, EOM and lids are normal.  Neck: No JVD present. Carotid bruit is not present. No edema present. No thyroid mass and no thyromegaly present.  Cardiovascular: S1 normal and S2 normal. Exam reveals no gallop.  No murmur heard. Pulses:      Dorsalis pedis pulses are 2+ on the right side and 2+ on the left side.  Respiratory:  No respiratory distress. She has no wheezes. She has no rhonchi. She has no rales.  GI: Soft. Bowel sounds are normal. There is no abdominal tenderness.  Musculoskeletal:     Right ankle: She exhibits swelling.     Left ankle: She exhibits swelling.  Lymphadenopathy:    She has no cervical adenopathy.  Neurological: She is alert and oriented to person, place, and time.  Power 4 out of 5 upper and lower extremities.  Some intermittent slurred speech.  Place patient through Epley maneuver and brought on the dizziness and seemed to relieve it a little bit.  Skin: Skin is warm. No rash noted. Nails show no clubbing.  Psychiatric: She has a normal mood and affect.      Data Reviewed: Basic Metabolic Panel: Recent Labs  Lab 01/07/19 1115  NA 137  K 3.9  CL 102  CO2 26  GLUCOSE 216*  BUN 14  CREATININE 0.79  CALCIUM 9.3   Liver Function Tests: Recent Labs  Lab 01/07/19 1115  AST 17  ALT 11  ALKPHOS 77  BILITOT 0.6  PROT 7.1  ALBUMIN 3.9   CBC: Recent Labs  Lab 01/07/19 1115  WBC 8.1  NEUTROABS 5.1  HGB 12.5  HCT 39.0  MCV 92.9  PLT 265    CBG: Recent Labs  Lab 01/07/19 1113 01/07/19 1656 01/07/19 2103 01/08/19 0748 01/08/19 1221  GLUCAP 196* 102* 125* 139* 125*     Studies: Ct Head Wo Contrast  Result  Date: 01/07/2019 CLINICAL DATA:  68 year old female with a history of headache EXAM: CT HEAD WITHOUT CONTRAST TECHNIQUE: Contiguous axial images were obtained from the base of the skull through the vertex without intravenous contrast. COMPARISON:  11/24/2017, MR 12/16/2017 FINDINGS: Brain: No acute intracranial hemorrhage. No midline shift or mass effect. Gray-white differentiation maintained. Unremarkable appearance of the ventricular system. Vascular: Unremarkable. Skull: No acute fracture.  No aggressive bone lesion identified. Sinuses/Orbits: Unremarkable appearance of the orbits. Mastoid air cells clear. No middle ear effusion. No significant sinus  disease. Other: None IMPRESSION: Negative head CT Electronically Signed   By: Gilmer Mor D.O.   On: 01/07/2019 11:34   Mr Brain Wo Contrast  Result Date: 01/07/2019 CLINICAL DATA:  Dizziness since yesterday, headache. History of hypertension, hyperlipidemia, diabetes and stroke. EXAM: MRI HEAD WITHOUT CONTRAST MRA HEAD WITHOUT CONTRAST TECHNIQUE: Multiplanar, multiecho pulse sequences of the brain and surrounding structures were obtained without intravenous contrast. Angiographic images of the head were obtained using MRA technique without contrast. COMPARISON:  CT HEAD January 07, 2019 and MRI of the head December 16, 2017 and MRA head January 11, 2017. FINDINGS: MRI HEAD FINDINGS INTRACRANIAL CONTENTS: No reduced diffusion to suggest acute ischemia. No susceptibility artifact to suggest hemorrhage. No parenchymal brain volume loss for age. No hydrocephalus. Faint nonspecific supratentorial white matter FLAIR T2 hyperintensities compatible with early chronic small vessel ischemic changes, normal for age. No suspicious parenchymal signal, masses, mass effect. No abnormal extra-axial fluid collections. No extra-axial masses. VASCULAR: Normal major intracranial vascular flow voids present at skull base. SKULL AND UPPER CERVICAL SPINE: No abnormal sellar expansion. Low signal C 4 vertebral body seen only on sagittal T1 sequence, nonoccluded on prior MRI. Craniocervical junction maintained. SINUSES/ORBITS: Minimal ethmoid mucosal thickening. No paranasal sinus air-fluid levels. Mastoid air cells are well aerated.The included ocular globes and orbital contents are non-suspicious. OTHER: None. MRA HEAD FINDINGS ANTERIOR CIRCULATION: Normal flow related enhancement of the included cervical, petrous, cavernous and supraclinoid internal carotid arteries. Patent anterior communicating artery. Patent anterior and middle cerebral arteries. No large vessel occlusion, flow limiting stenosis, aneurysm. POSTERIOR  CIRCULATION: Codominant vertebral arteries. Vertebrobasilar arteries are patent, with normal flow related enhancement of the main branch vessels. Patent posterior cerebral arteries. Predominately fetal origin bilateral posterior cerebral arteries. No large vessel occlusion, flow limiting stenosis,  aneurysm. ANATOMIC VARIANTS: None. Source images and MIP images were reviewed. IMPRESSION: MRI HEAD: 1. No acute intracranial process; negative noncontrast MRI head for age. 2. Abnormal signal C4 vertebral body may be artifact; consider non emergent CT versus MRI cervical spine. MRA HEAD: 1. No emergent large vessel occlusion or flow-limiting stenosis. Electronically Signed   By: Awilda Metro M.D.   On: 01/07/2019 22:42   Mr Cervical Spine Wo Contrast  Result Date: 01/08/2019 CLINICAL DATA:  Dizziness. History of prior stroke. Abnormal signal is seen in the C4 vertebral body on brain MRI yesterday. EXAM: MRI CERVICAL SPINE WITHOUT CONTRAST TECHNIQUE: Multiplanar, multisequence MR imaging of the cervical spine was performed. No intravenous contrast was administered. COMPARISON:  Cervical spine MRI 04/19/2012.  Brain MRI 01/07/2019. FINDINGS: Alignment: Straightening of lordosis is unchanged. Vertebrae: There is no fracture. Since the prior cervical MRI, the patient has developed marked degenerative endplate signal change at C4-5 with small Schmorl's nodes in the endplates. Additional scattered small Schmorl's nodes are noted. Cord: Normal signal throughout. Posterior Fossa, vertebral arteries, paraspinal tissues: Negative. Disc levels: C2-3: Minimal posterior bony ridging and mild facet degenerative disease. No stenosis. C3-4: There is some  ligamentum flavum thickening, a shallow disc bulge and some uncovertebral spurring. Mild central canal narrowing is present. Moderate to moderately severe foraminal narrowing is worse on the right. Spondylosis shows some progression since the prior MRI. C4-5: There is a shallow  disc bulge and uncovertebral disease. The ventral thecal sac is effaced. Moderate to moderately severe foraminal narrowing is worse on the left. Spondylosis has progressed since the prior exam. C5-6: Minimal disc bulge and left worse than right uncovertebral disease. The central canal is open. Moderate to moderately severe foraminal narrowing is worse on the left. Spondylosis has progressed since the prior exam. C6-7: There is a shallow broad-based disc bulge and some uncovertebral disease. Ligamentum flavum thickening is noted. Mild deformity of the ventral cord is more notable on the left. Mild bilateral foraminal narrowing is present. The appearance is not notably changed. C7-T1: There is a shallow disc bulge and some uncovertebral disease. Ligamentum flavum thickening is seen. The central canal is open. Mild to moderate foraminal narrowing is worse on the left. The appearance is unchanged. IMPRESSION: Marked degenerative endplate signal change at C4-5 accounts for signal abnormality seen on brain MRI yesterday and is new since the patient's 2013 cervical spine MRI. Some progression of spondylosis at C3-4, C4-5 and C5-6. Moderate to moderately severe foraminal narrowing at each of these levels is worse on the left. Mild central canal narrowing is present at C3-4 and C4-5. Mild deformity of the ventral cord at C6-7 and mild bilateral foraminal narrowing are unchanged. Electronically Signed   By: Drusilla Kanner M.D.   On: 01/08/2019 11:54   US Carotid Bilateral (at [facility] And [facility] Only)  Result Date: 01/07/2019 CLINICAL DATA:  68 year old female with a history of cerebrovascular accident EXAM: BILATERAL CAROTID DUPLEX ULTRASOUND TECHNIQUE: Wallace Cullens scale imaging, color Doppler and duplex ultrasound were performed of bilateral carotid and vertebral arteries in the neck. COMPARISON:  01/11/2017 FINDINGS: Criteria: Quantification of carotid stenosis is based on velocity parameters that correlate the residual internal  carotid diameter with NASCET-based stenosis levels, using the diameter of the distal internal carotid lumen as the denominator for stenosis measurement. The following velocity measurements were obtained: RIGHT ICA:  Systolic 80 cm/sec, Diastolic 29 cm/sec CCA:  47 cm/sec SYSTOLIC ICA/CCA RATIO:  1.7 ECA:  50 cm/sec LEFT ICA:  Systolic 99 cm/sec, Diastolic 37 cm/sec CCA:  58 cm/sec SYSTOLIC ICA/CCA RATIO:  1.2 ECA:  117 cm/sec Right Brachial SBP: Not acquired Left Brachial SBP: Not acquired RIGHT CAROTID ARTERY: No significant calcifications of the right common carotid artery. Intermediate waveform maintained. Moderate heterogeneous and partially calcified plaque at the right carotid bifurcation. No significant lumen shadowing. Low resistance waveform of the right ICA. No significant tortuosity. RIGHT VERTEBRAL ARTERY: Antegrade flow with low resistance waveform. LEFT CAROTID ARTERY: No significant calcifications of the left common carotid artery. Intermediate waveform maintained. Moderate heterogeneous and partially calcified plaque at the left carotid bifurcation. No significant lumen shadowing. Low resistance waveform of the left ICA. No significant tortuosity. LEFT VERTEBRAL ARTERY:  Antegrade flow with low resistance waveform. IMPRESSION: Color duplex indicates moderate heterogeneous and calcified plaque, with no hemodynamically significant stenosis by duplex criteria in the extracranial cerebrovascular circulation. Signed, Yvone Neu. Reyne Dumas, RPVI [facility] Rad[facility] Electronically Signed   By: Gilmer Mor D.O.   On: 01/07/2019 16:42   Mr Maxine Glenn Head/brain FB Cm  Result Date: 01/07/2019 CLINICAL DATA:  Dizziness since yesterday, headache. History of hypertension, hyperlipidemia, diabetes and stroke. EXAM: MRI HEAD WITHOUT  CONTRAST MRA HEAD WITHOUT CONTRAST TECHNIQUE: Multiplanar, multiecho pulse sequences of the brain and surrounding structures were  obtained without intravenous contrast. Angiographic images of the head were obtained using MRA technique without contrast. COMPARISON:  CT HEAD January 07, 2019 and MRI of the head December 16, 2017 and MRA head January 11, 2017. FINDINGS: MRI HEAD FINDINGS INTRACRANIAL CONTENTS: No reduced diffusion to suggest acute ischemia. No susceptibility artifact to suggest hemorrhage. No parenchymal brain volume loss for age. No hydrocephalus. Faint nonspecific supratentorial white matter FLAIR T2 hyperintensities compatible with early chronic small vessel ischemic changes, normal for age. No suspicious parenchymal signal, masses, mass effect. No abnormal extra-axial fluid collections. No extra-axial masses. VASCULAR: Normal major intracranial vascular flow voids present at skull base. SKULL AND UPPER CERVICAL SPINE: No abnormal sellar expansion. Low signal C 4 vertebral body seen only on sagittal T1 sequence, nonoccluded on prior MRI. Craniocervical junction maintained. SINUSES/ORBITS: Minimal ethmoid mucosal thickening. No paranasal sinus air-fluid levels. Mastoid air cells are well aerated.The included ocular globes and orbital contents are non-suspicious. OTHER: None. MRA HEAD FINDINGS ANTERIOR CIRCULATION: Normal flow related enhancement of the included cervical, petrous, cavernous and supraclinoid internal carotid arteries. Patent anterior communicating artery. Patent anterior and middle cerebral arteries. No large vessel occlusion, flow limiting stenosis, aneurysm. POSTERIOR CIRCULATION: Codominant vertebral arteries. Vertebrobasilar arteries are patent, with normal flow related enhancement of the main branch vessels. Patent posterior cerebral arteries. Predominately fetal origin bilateral posterior cerebral arteries. No large vessel occlusion, flow limiting stenosis,  aneurysm. ANATOMIC VARIANTS: None. Source images and MIP images were reviewed. IMPRESSION: MRI HEAD: 1. No acute intracranial process; negative  noncontrast MRI head for age. 2. Abnormal signal C4 vertebral body may be artifact; consider non emergent CT versus MRI cervical spine. MRA HEAD: 1. No emergent large vessel occlusion or flow-limiting stenosis. Electronically Signed   By: Awilda Metroourtnay  Bloomer M.D.   On: 01/07/2019 22:42    Scheduled Meds: . aspirin EC  81 mg Oral Daily  . clopidogrel  75 mg Oral Daily  . enoxaparin (LOVENOX) injection  40 mg Subcutaneous Q24H  . insulin aspart  0-9 Units Subcutaneous TID WC  . multivitamin with minerals  1 tablet Oral Daily  . pantoprazole  40 mg Oral Daily  . ENSURE MAX PROTEIN  11 oz Oral BID  . simvastatin  40 mg Oral QPM  . sodium chloride flush  3 mL Intravenous Once   Continuous Infusions: . sodium chloride 50 mL/hr at 01/07/19 1700  . magnesium sulfate 1 - 4 g bolus IVPB      Assessment/Plan:  1. Headache, vertigo, weakness, intermittent speech abnormality.  Concern is for MRI negative stroke.  Neurology recommending aspirin and Plavix.  CT angios still pending.  We will try to give meclizine for vertigo.  We will give magnesium for headache.  Physical therapy recommending home with home health.  Speech recommending continued working with the patient for speech.  Did give referral to neck step therapy and balance center. 2. Hypertension.  Allow permissive hypertension.  Holding antihypertensives. 3. Hyperlipidemia unspecified.  LDL 85.  Switch Zocor over to Lipitor. 4. Neck pain.  MRI did show a lot of findings but I am not sure if this would explain neck pain or her symptoms of headache or dizziness.  Code Status:     Code Status Orders  (From admission, onward)         Start     Ordered   01/07/19 1305  Full code  Continuous  01/07/19 1306        Code Status History    Date Active Date Inactive Code Status Order ID Comments User Context   01/10/2017 2224 01/11/2017 2150 Full Code [id number]  Oralia ManisWillis, David, MD Inpatient   12/24/2015 0055 12/24/2015 1643 Full Code [id number]   Oralia ManisWillis, David, MD Inpatient     Family Communication: Family at bedside Disposition Plan: Potentially home tomorrow  Consultants:  Neurology  Time spent: 32 minutes  Jadine Brumley [employer]  [employer]

## 2019-01-08 NOTE — Progress Notes (Signed)
Initial Nutrition Assessment  DOCUMENTATION CODES:   Not applicable  INTERVENTION:   - Ensure Max po BID, each supplement provides 150 kcal and 30 grams of protein  - MVI with minerals daily  NUTRITION DIAGNOSIS:   Inadequate oral intake related to poor appetite as evidenced by per patient/family report.  GOAL:   Patient will meet greater than or equal to 90% of their needs  MONITOR:   PO intake, Supplement acceptance, Labs, Weight trends  REASON FOR ASSESSMENT:   Malnutrition Screening Tool    ASSESSMENT:   68 year old female who presented to the ED on 2/20 with right-sided ataxia, headache, dizziness, and aphasia. PMH significant for TIA, DM, HTN, HLD, GERD. Pt admitted with acute CVA.  Spoke with pt and husband Greggory Stallion at bedside. Pt and [employer] at time of visit. Pt had completed ~90% of meal (mashed potatoes, pot roast and gravy, dinner roll) by end of RD visit.  Pt states that her appetite is "fair" today. Pt reports that over the last year, her appetite has been decreased. Pt states that both her mom and her oldest brother passed away over the last year which she thinks may have contributed to her decreased appetite. Pt's husband also states that there are several foods that pt "can't" have (vegetables in particular) because these foods don't agree with her. Pt states that she has IBS and acid reflux.  Pt shares that she typically eats 2 meals daily. Breakfast at 10:30-11:00 am includes a bowl of Cheerios with 2% milk or eggs. Pt does not eat again until 5:00 pm when she has dinner which may include baked chicken, chicken pot pie, something from [facility], and occasionally pizza.  Pt endorses weight loss and reports her UBW as 255-256 lbs. Pt states that her current weight is 217 lbs. RD obtained bed weight at time of visit: 229.5 lbs. Weight was taken out of position due to pt eating lunch so unsure of accuracy. Per weight history in chart, pt with 8.4 kg weight  loss over the last 1 year. This is an 8.5% weight loss which is not significant for timeframe.  Pt amenable to trying Ensure Max oral nutrition supplements. Pt's husband with questions about the purpose of oral nutrition supplements and whether pt should drink these after d/c. Provided education regarding the importance of adequate kcal and protein intake in maintaining lean muscle mass.  Medications reviewed and include: SSI, Protonix  Labs reviewed: hemoglobin A1C 7.4 (H) CBG's: 125, 139, 125, 102 x 24 hours  UOP: 700 ml x 24 hours  NUTRITION - FOCUSED PHYSICAL EXAM:    Most Recent Value  Orbital Region  No depletion  Upper Arm Region  No depletion  Thoracic and Lumbar Region  No depletion  Buccal Region  No depletion  Temple Region  No depletion  Clavicle Bone Region  No depletion  Clavicle and Acromion Bone Region  No depletion  Scapular Bone Region  No depletion  Dorsal Hand  No depletion  Patellar Region  No depletion  Anterior Thigh Region  No depletion  Posterior Calf Region  No depletion  Edema (RD Assessment)  None  Hair  Reviewed  Eyes  Reviewed  Mouth  Reviewed  Skin  Reviewed  Nails  Reviewed       Diet Order:   Diet Order            Diet Carb Modified Fluid consistency: Thin; Room service appropriate? Yes  Diet effective now  EDUCATION NEEDS:   Education needs have been addressed  Skin:  Skin Assessment: Reviewed RN Assessment  Last BM:  2/20  Height:   Ht Readings from Last 1 Encounters:  01/07/19 5\' 9"  (1.753 m)    Weight:   Wt Readings from Last 1 Encounters:  01/07/19 90.7 kg    Ideal Body Weight:  65.9 kg  BMI:  Body mass index is 29.53 kg/m.  Estimated Nutritional Needs:   Kcal:  1800-2000  Protein:  95-110 grams  Fluid:  >/= 1.8 L    Earma Reading, MS, RD, LDN Inpatient Clinical Dietitian Pager: [phone] Weekend/After Hours: [phone]

## 2019-01-08 NOTE — Evaluation (Signed)
Physical Therapy Evaluation Patient Details Name: Destiny Savage MRN: [id number] DOB: November 09, 1951 Today's Date: 01/08/2019   History of Present Illness  Destiny Savage is a 68yo female who comes to [facility] On 2/20 after wosening of 52M HA, dizziness, and difficulty with her words. PMH: DM, HTN , CVA (Feb 2018).   Clinical Impression  Pt admitted with above diagnosis. Pt currently with functional limitations due to the deficits listed below (see "PT Problem List"). Upon entry, pt in bed, no family/caregiver present. The pt is awake and agreeable to participate.  The pt is alert and oriented x3, pleasant, conversational, and following commands consistently. Pt is a fair historian, some difficulty with detail at times, and often tangential responses. Pt remains dizzy throughout, irrespective of position or activity. Oculomotor exam WNL, unremarkable. BUE strength symmetrical. Digital opposition slow, steady and equal bilat. Functional mobility assessment demonstrates increased effort/time requirements, good tolerance, but no frank need for physical assistance, whereas the patient performed these at a slightly higher level of independence PTA. Pt AMB very slowly in examination and exercise, but reports this to be very much near her baseline gait speed. Pt will benefit from skilled PT intervention to increase independence and safety with basic mobility in preparation for discharge to the venue listed below.       Follow Up Recommendations Home health PT;Supervision/Assistance - 24 hour    Equipment Recommendations  None recommended by PT    Recommendations for Other Services       Precautions / Restrictions Precautions Precautions: Fall Restrictions Weight Bearing Restrictions: No      Mobility  Bed Mobility Overal bed mobility: Modified Independent                Transfers Overall transfer level: Needs assistance   Transfers: Sit to/from Stand Sit to Stand: Supervision          General transfer comment: slow, labored, but steady, uses fixed objects nearby for support  Ambulation/Gait   Gait Distance (Feet): 160 Feet Assistive device: None(intermittent railing use ) Gait Pattern/deviations: WFL(Within Functional Limits)(very slow, and mostly steady) Gait velocity: 0.86m/s.    General Gait Details: no significant asymetry noted, no gait instability, pt reports continued giddiness localized to the central forehead  Stairs            Wheelchair Mobility    Modified Rankin (Stroke Patients Only)       Balance Overall balance assessment: Modified Independent;History of Falls(intermittent railing use)                                           Pertinent Vitals/Pain      Home Living Family/patient expects to be discharged to:: Private residence Living Arrangements: Spouse/significant other   Type of Home: House Home Access: Stairs to enter   [employer] of Steps: 3-4" over threshold Home Layout: One level Home Equipment: Walker - 2 wheels Additional Comments: in past three months, several nearfall episode from over reaching     Prior Function Level of Independence: Independent with assistive device(s)         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 )    Lower  Extremity Assessment Lower Extremity Assessment: Overall WFL for tasks assessed    Cervical / Trunk Assessment Cervical / Trunk Assessment: Normal  Communication   Communication: No difficulties  Cognition Arousal/Alertness: Awake/alert Behavior During Therapy: WFL for tasks assessed/performed Overall Cognitive Status: Within Functional Limits for tasks assessed                                         General Comments      Exercises     Assessment/Plan    PT Assessment Patient needs continued PT services  PT Problem List Decreased activity tolerance;Decreased mobility;Decreased balance;Decreased knowledge of use of DME       PT Treatment Interventions Therapeutic exercise;DME instruction;Gait training;Stair training;Functional mobility training;Therapeutic activities;Patient/family education;Neuromuscular re-education;Balance training    PT Goals (Current goals can be found in the Care Plan section)  Acute Rehab PT Goals Patient Stated Goal: decrease dizziness and HA  PT Goal Formulation: With patient Time For Goal Achievement: 01/22/19 Potential to Achieve Goals: Fair    Frequency Min 2X/week   Barriers to discharge        Co-evaluation               AM-PAC PT "6 Clicks" Mobility  Outcome Measure Help needed turning from your back to your side while in a flat bed without using bedrails?: None Help needed moving from lying on your back to sitting on the side of a flat bed without using bedrails?: None Help needed moving to and from a bed to a chair (including a wheelchair)?: A Little Help needed standing up from a chair using your arms (e.g., wheelchair or bedside chair)?: A Little Help needed to walk in hospital room?: A Little Help needed climbing 3-5 steps with a railing? : A Little 6 Click Score: 20    End of Session Equipment Utilized During Treatment: Gait belt Activity Tolerance: Patient tolerated treatment well Patient left: in bed;with family/visitor present;with call bell/phone within reach Nurse Communication: Mobility status PT Visit Diagnosis: Other abnormalities of gait and mobility (R26.89);Difficulty in walking, not elsewhere classified (R26.2);Dizziness and giddiness (R42)    Time: 6283-1517 PT Time Calculation (min) (ACUTE ONLY): 24 min   Charges:   PT Evaluation $PT Eval Moderate Complexity: 1 Mod PT  Treatments $Therapeutic Exercise: 8-22 mins        2:38 PM, 01/08/19 Rosamaria Lints, PT, DPT Physical Therapist - [facility]  [phone] (ASCOM)     Buccola,Allan C 01/08/2019, 2:35 PM

## 2019-01-09 DIAGNOSIS — G459 Transient cerebral ischemic attack, unspecified: Secondary | ICD-10-CM | POA: Diagnosis not present

## 2019-01-09 LAB — HIV ANTIBODY (ROUTINE TESTING W REFLEX): HIV Screen 4th Generation wRfx: NONREACTIVE

## 2019-01-09 LAB — GLUCOSE, CAPILLARY
Glucose-Capillary: 145 mg/dL — ABNORMAL HIGH (ref 70–99)
Glucose-Capillary: 137 mg/dL — ABNORMAL HIGH (ref 70–99)

## 2019-01-09 MED ORDER — MECLIZINE HCL 12.5 MG PO TABS: 12.5000 mg | ORAL_TABLET | Freq: Three times a day (TID) | ORAL | 0 refills | Status: AC

## 2019-01-09 NOTE — Progress Notes (Signed)
RN removed her IV and helped patient into her car. She is leaving with her husband in a private vehicle.  Patient and husband asked questions and participated actively in the patient discharge education.  Christean Grief, RN

## 2019-01-09 NOTE — Progress Notes (Signed)
Subjective: Patient at baseline.  No new neurological complaints.    Objective: Current vital signs: BP (!) 158/90 (BP Location: Left Arm)   Pulse 73   Temp 98.2 F (36.8 C) (Oral)   Resp 16   Ht  (1.753 m)   Wt 90.7 kg   SpO2 100%   BMI 29.53 kg/m  Vital signs in last 24 hours: Temp:  [98.2 F (36.8 C)-98.3 F (36.8 C)] 98.2 F (36.8 C) (02/22 0809) Pulse Rate:  [65-73] 73 (02/22 0809) BP: (137-158)/(71-90) 158/90 (02/22 0809) SpO2:  [100 %] 100 % (02/22 0809)  Intake/Output from previous day: 02/21 0701 - 02/22 0700 In: 260 [P.O.:260] Out: 1100 [Urine:1100] Intake/Output this shift: No intake/output data recorded. Nutritional status:  Diet Order            Diet Carb Modified Fluid consistency: Thin; Room service appropriate? Yes  Diet effective now              Neurologic Exam: Mental Status: Alert, oriented, thought content appropriate. Speech fluent without evidence of aphasia. Able to follow 3 step commands without difficulty. Attention span and concentration seemed appropriate  Cranial Nerves: II: Discs flat bilaterally; Visual fields grossly normal, pupils equal, round, reactive to light and accommodation III,IV, VI: ptosis not present, extra-ocular motions intact bilaterally V,VII: smile symmetric, facial light touch sensationintact VIII: hearing normal bilaterally IX,X: gag reflex present XI: bilateral shoulder shrug XII: midline tongue extension Motor: 5/5 throughout Sensory: Pinprick and light touchintact bilaterally  Lab Results: Basic Metabolic Panel: Recent Labs  Lab 01/07/19 1115  NA 137  K 3.9  CL 102  CO2 26  GLUCOSE 216*  BUN 14  CREATININE 0.79  CALCIUM 9.3    Liver Function Tests: Recent Labs  Lab 01/07/19 1115  AST 17  ALT 11  ALKPHOS 77  BILITOT 0.6  PROT 7.1  ALBUMIN 3.9   No results for input(s): LIPASE, AMYLASE in the last 168 hours. No results for input(s): AMMONIA in the last 168  hours.  CBC: Recent Labs  Lab 01/07/19 1115  WBC 8.1  NEUTROABS 5.1  HGB 12.5  HCT 39.0  MCV 92.9  PLT 265    Cardiac Enzymes: No results for input(s): CKTOTAL, CKMB, CKMBINDEX, TROPONINI in the last 168 hours.  Lipid Panel: Recent Labs  Lab 01/08/19 0443  CHOL 153  TRIG 89  HDL 50  CHOLHDL 3.1  VLDL 18  LDLCALC 85    CBG: Recent Labs  Lab 01/08/19 0748 01/08/19 1221 01/08/19 1717 01/08/19 2102 01/09/19 0811  GLUCAP 139* 125* 106* 146* 145*    Microbiology: No results found for this or any previous visit.  Coagulation Studies: Recent Labs    01/07/19 1115  LABPROT 12.5  INR 0.94    Imaging: Ct Head Wo Contrast  Result Date: 01/07/2019 CLINICAL DATA:  68 year old female with a history of headache EXAM: CT HEAD WITHOUT CONTRAST TECHNIQUE: Contiguous axial images were obtained from the base of the skull through the vertex without intravenous contrast. COMPARISON:  11/24/2017, MR 12/16/2017 FINDINGS: Brain: No acute intracranial hemorrhage. No midline shift or mass effect. Gray-white differentiation maintained. Unremarkable appearance of the ventricular system. Vascular: Unremarkable. Skull: No acute fracture.  No aggressive bone lesion identified. Sinuses/Orbits: Unremarkable appearance of the orbits. Mastoid air cells clear. No middle ear effusion. No significant sinus disease. Other: None IMPRESSION: Negative head CT Electronically Signed   By: Gilmer Mor D.O.   On: 01/07/2019 11:34   Ct Angio Neck W Or  Wo Contrast  Result Date: 01/08/2019 CLINICAL DATA:  Dizziness and speech difficulties since yesterday, headache. History of hypertension, hyperlipidemia, diabetes and stroke. EXAM: CT ANGIOGRAPHY NECK TECHNIQUE: Multidetector CT imaging of the neck was performed using the standard protocol during bolus administration of intravenous contrast. Multiplanar CT image reconstructions and MIPs were obtained to evaluate the vascular anatomy. Carotid stenosis  measurements (when applicable) are obtained utilizing NASCET criteria, using the distal internal carotid diameter as the denominator. CONTRAST:  35mL OMNIPAQUE IOHEXOL 350 MG/ML SOLN COMPARISON:  MRA head January 07, 2019 FINDINGS: AORTIC ARCH: Normal appearance of the thoracic arch, 2 vessel arch is a normal variant. Mild calcific atherosclerosis aortic arch. The origins of the innominate, left Common carotid artery and subclavian artery are widely patent. RIGHT CAROTID SYSTEM: Common carotid artery is widely patent, mild calcific atherosclerosis. Moderate calcific atherosclerosis of the carotid bifurcation without hemodynamically significant stenosis by NASCET criteria. Tortuous patent ICA with fold. LEFT CAROTID SYSTEM: Common carotid artery is patent, mild calcific atherosclerosis. Calcific atherosclerosis distal common carotid artery to carotid bifurcation resulting in moderate stenosis distal LEFT CCA. No hemodynamically significant stenosis LEFT ICA by NASCET criteria. Without hemodynamically significant stenosis by NASCET criteria. Normal appearance of the included internal carotid artery. VERTEBRAL ARTERIES:Severe stenosis LEFT vertebral artery origin. Patent bilateral vertebral arteries. SKELETON: No acute osseous process though bone windows have not been submitted. Cervical spondylosis better demonstrated on MRI of the cervical spine today, reported separately. OTHER NECK: Soft tissues of the neck are nonacute though, not tailored for evaluation. UPPER CHEST: Included lung apices are clear. No superior mediastinal lymphadenopathy. IMPRESSION: 1. No hemodynamically significant stenosis ICA's. 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 2. Patent vertebral arteries. Moderate stenosis LEFT vertebral artery origin. Aortic Atherosclerosis (ICD10-I70.0). Electronically Signed   By: Awilda Metro M.D.   On: 01/08/2019 16:39   Mr Brain Wo Contrast  Result Date: 01/07/2019 CLINICAL DATA:   Dizziness since yesterday, headache. History of hypertension, hyperlipidemia, diabetes and stroke. EXAM: MRI HEAD WITHOUT CONTRAST MRA HEAD WITHOUT CONTRAST TECHNIQUE: Multiplanar, multiecho pulse sequences of the brain and surrounding structures were obtained without intravenous contrast. Angiographic images of the head were obtained using MRA technique without contrast. COMPARISON:  CT HEAD January 07, 2019 and MRI of the head December 16, 2017 and MRA head January 11, 2017. FINDINGS: MRI HEAD FINDINGS INTRACRANIAL CONTENTS: No reduced diffusion to suggest acute ischemia. No susceptibility artifact to suggest hemorrhage. No parenchymal brain volume loss for age. No hydrocephalus. Faint nonspecific supratentorial white matter FLAIR T2 hyperintensities compatible with early chronic small vessel ischemic changes, normal for age. No suspicious parenchymal signal, masses, mass effect. No abnormal extra-axial fluid collections. No extra-axial masses. VASCULAR: Normal major intracranial vascular flow voids present at skull base. SKULL AND UPPER CERVICAL SPINE: No abnormal sellar expansion. Low signal C 4 vertebral body seen only on sagittal T1 sequence, nonoccluded on prior MRI. Craniocervical junction maintained. SINUSES/ORBITS: Minimal ethmoid mucosal thickening. No paranasal sinus air-fluid levels. Mastoid air cells are well aerated.The included ocular globes and orbital contents are non-suspicious. OTHER: None. MRA HEAD FINDINGS ANTERIOR CIRCULATION: Normal flow related enhancement of the included cervical, petrous, cavernous and supraclinoid internal carotid arteries. Patent anterior communicating artery. Patent anterior and middle cerebral arteries. No large vessel occlusion, flow limiting stenosis, aneurysm. POSTERIOR CIRCULATION: Codominant vertebral arteries. Vertebrobasilar arteries are patent, with normal flow related enhancement of the main branch vessels. Patent posterior cerebral arteries. Predominately  fetal origin bilateral posterior cerebral arteries. No large vessel occlusion, flow limiting stenosis,  aneurysm. ANATOMIC VARIANTS: None. Source images and MIP images were reviewed. IMPRESSION: MRI HEAD: 1. No acute intracranial process; negative noncontrast MRI head for age. 2. Abnormal signal C4 vertebral body may be artifact; consider non emergent CT versus MRI cervical spine. MRA HEAD: 1. No emergent large vessel occlusion or flow-limiting stenosis. Electronically Signed   By: Awilda Metro M.D.   On: 01/07/2019 22:42   Mr Cervical Spine Wo Contrast  Result Date: 01/08/2019 CLINICAL DATA:  Dizziness. History of prior stroke. Abnormal signal is seen in the C4 vertebral body on brain MRI yesterday. EXAM: MRI CERVICAL SPINE WITHOUT CONTRAST TECHNIQUE: Multiplanar, multisequence MR imaging of the cervical spine was performed. No intravenous contrast was administered. COMPARISON:  Cervical spine MRI 04/19/2012.  Brain MRI 01/07/2019. FINDINGS: Alignment: Straightening of lordosis is unchanged. Vertebrae: There is no fracture. Since the prior cervical MRI, the patient has developed marked degenerative endplate signal change at C4-5 with small Schmorl's nodes in the endplates. Additional scattered small Schmorl's nodes are noted. Cord: Normal signal throughout. Posterior Fossa, vertebral arteries, paraspinal tissues: Negative. Disc levels: C2-3: Minimal posterior bony ridging and mild facet degenerative disease. No stenosis. C3-4: There is some ligamentum flavum thickening, a shallow disc bulge and some uncovertebral spurring. Mild central canal narrowing is present. Moderate to moderately severe foraminal narrowing is worse on the right. Spondylosis shows some progression since the prior MRI. C4-5: There is a shallow disc bulge and uncovertebral disease. The ventral thecal sac is effaced. Moderate to moderately severe foraminal narrowing is worse on the left. Spondylosis has progressed since the prior exam.  C5-6: Minimal disc bulge and left worse than right uncovertebral disease. The central canal is open. Moderate to moderately severe foraminal narrowing is worse on the left. Spondylosis has progressed since the prior exam. C6-7: There is a shallow broad-based disc bulge and some uncovertebral disease. Ligamentum flavum thickening is noted. Mild deformity of the ventral cord is more notable on the left. Mild bilateral foraminal narrowing is present. The appearance is not notably changed. C7-T1: There is a shallow disc bulge and some uncovertebral disease. Ligamentum flavum thickening is seen. The central canal is open. Mild to moderate foraminal narrowing is worse on the left. The appearance is unchanged. IMPRESSION: Marked degenerative endplate signal change at C4-5 accounts for signal abnormality seen on brain MRI yesterday and is new since the patient's 2013 cervical spine MRI. Some progression of spondylosis at C3-4, C4-5 and C5-6. Moderate to moderately severe foraminal narrowing at each of these levels is worse on the left. Mild central canal narrowing is present at C3-4 and C4-5. Mild deformity of the ventral cord at C6-7 and mild bilateral foraminal narrowing are unchanged. Electronically Signed   By: Drusilla Kanner M.D.   On: 01/08/2019 11:54   US Carotid Bilateral (at [facility] And [facility] Only)  Result Date: 01/07/2019 CLINICAL DATA:  68 year old female with a history of cerebrovascular accident EXAM: BILATERAL CAROTID DUPLEX ULTRASOUND TECHNIQUE: Wallace Cullens scale imaging, color Doppler and duplex ultrasound were performed of bilateral carotid and vertebral arteries in the neck. COMPARISON:  01/11/2017 FINDINGS: Criteria: Quantification of carotid stenosis is based on velocity parameters that correlate the residual internal carotid diameter with NASCET-based stenosis levels, using the diameter of the distal internal carotid lumen as the denominator for stenosis measurement. The following velocity measurements were  obtained: RIGHT ICA:  Systolic 80 cm/sec, Diastolic 29 cm/sec CCA:  47 cm/sec SYSTOLIC ICA/CCA RATIO:  1.7 ECA:  50 cm/sec LEFT ICA:  Systolic 99 cm/sec, Diastolic  37 cm/sec CCA:  58 cm/sec SYSTOLIC ICA/CCA RATIO:  1.2 ECA:  117 cm/sec Right Brachial SBP: Not acquired Left Brachial SBP: Not acquired RIGHT CAROTID ARTERY: No significant calcifications of the right common carotid artery. Intermediate waveform maintained. Moderate heterogeneous and partially calcified plaque at the right carotid bifurcation. No significant lumen shadowing. Low resistance waveform of the right ICA. No significant tortuosity. RIGHT VERTEBRAL ARTERY: Antegrade flow with low resistance waveform. LEFT CAROTID ARTERY: No significant calcifications of the left common carotid artery. Intermediate waveform maintained. Moderate heterogeneous and partially calcified plaque at the left carotid bifurcation. No significant lumen shadowing. Low resistance waveform of the left ICA. No significant tortuosity. LEFT VERTEBRAL ARTERY:  Antegrade flow with low resistance waveform. IMPRESSION: Color duplex indicates moderate heterogeneous and calcified plaque, with no hemodynamically significant stenosis by duplex criteria in the extracranial cerebrovascular circulation. Signed, Yvone Neu. Reyne Dumas, RPVI [facility] Electronically Signed   By: Gilmer Mor D.O.   On: 01/07/2019 16:42   Mr Maxine Glenn Head/brain ZO Cm  Result Date: 01/07/2019 CLINICAL DATA:  Dizziness since yesterday, headache. History of hypertension, hyperlipidemia, diabetes and stroke. EXAM: MRI HEAD WITHOUT CONTRAST MRA HEAD WITHOUT CONTRAST TECHNIQUE: Multiplanar, multiecho pulse sequences of the brain and surrounding structures were obtained without intravenous contrast. Angiographic images of the head were obtained using MRA technique without contrast. COMPARISON:  CT HEAD January 07, 2019 and MRI of the head December 16, 2017  and MRA head January 11, 2017. FINDINGS: MRI HEAD FINDINGS INTRACRANIAL CONTENTS: No reduced diffusion to suggest acute ischemia. No susceptibility artifact to suggest hemorrhage. No parenchymal brain volume loss for age. No hydrocephalus. Faint nonspecific supratentorial white matter FLAIR T2 hyperintensities compatible with early chronic small vessel ischemic changes, normal for age. No suspicious parenchymal signal, masses, mass effect. No abnormal extra-axial fluid collections. No extra-axial masses. VASCULAR: Normal major intracranial vascular flow voids present at skull base. SKULL AND UPPER CERVICAL SPINE: No abnormal sellar expansion. Low signal C 4 vertebral body seen only on sagittal T1 sequence, nonoccluded on prior MRI. Craniocervical junction maintained. SINUSES/ORBITS: Minimal ethmoid mucosal thickening. No paranasal sinus air-fluid levels. Mastoid air cells are well aerated.The included ocular globes and orbital contents are non-suspicious. OTHER: None. MRA HEAD FINDINGS ANTERIOR CIRCULATION: Normal flow related enhancement of the included cervical, petrous, cavernous and supraclinoid internal carotid arteries. Patent anterior communicating artery. Patent anterior and middle cerebral arteries. No large vessel occlusion, flow limiting stenosis, aneurysm. POSTERIOR CIRCULATION: Codominant vertebral arteries. Vertebrobasilar arteries are patent, with normal flow related enhancement of the main branch vessels. Patent posterior cerebral arteries. Predominately fetal origin bilateral posterior cerebral arteries. No large vessel occlusion, flow limiting stenosis,  aneurysm. ANATOMIC VARIANTS: None. Source images and MIP images were reviewed. IMPRESSION: MRI HEAD: 1. No acute intracranial process; negative noncontrast MRI head for age. 2. Abnormal signal C4 vertebral body may be artifact; consider non emergent CT versus MRI cervical spine. MRA HEAD: 1. No emergent large vessel occlusion or flow-limiting  stenosis. Electronically Signed   By: Awilda Metro M.D.   On: 01/07/2019 22:42    Medications:  I have reviewed the patient's current medications. Scheduled: . aspirin EC  81 mg Oral Daily  . atorvastatin  40 mg Oral q1800  . clopidogrel  75 mg Oral Daily  . enoxaparin (LOVENOX) injection  40 mg Subcutaneous Q24H  . insulin aspart  0-9 Units Subcutaneous TID WC  . meclizine  12.5 mg Oral TID  . multivitamin with minerals  1  tablet Oral Daily  . pantoprazole  40 mg Oral Daily  . ENSURE MAX PROTEIN  11 oz Oral BID  . sodium chloride flush  3 mL Intravenous Once    Assessment/Plan: Patient back to baseline. Suspect TIA as etiology of presenting symptoms.  Patient on ASA and Plavix.  Echocardiogram pending.  MRI of the cervical spine shows extensive degenerative changes.  CTA of the neck is significant for moderate stenosis of the left common carotid and vertebral origin.  Aggressive management of risk factors recommended at this time.    Recommendations: 1. Continue dual therapy Aspirin 81 mg/day and Plavix 75 mg /day  2. Aggressive lipid management with goal low density lipoprotein (LDL) <70 mg/dl,  3. Blood sugar management with target A1c<7.0 4. Follow up with neurology on an outpatient basis   LOS: 2 days   Thana Farr, MD Neurology [phone] 01/09/2019  9:59 AM

## 2019-01-09 NOTE — Discharge Summary (Signed)
[employer] [facility]   PATIENT NAME: Destiny Savage    MR#:  [id number]  DATE OF BIRTH:  Aug 06, 1951  DATE OF ADMISSION:  01/07/2019 ADMITTING PHYSICIAN: Adrian Saran, MD  DATE OF DISCHARGE: 01/09/2019  PRIMARY CARE PHYSICIAN: Jaclyn Shaggy, MD   ADMISSION DIAGNOSIS:  CVA (cerebral vascular accident) [facility]) [I63.9] Cerebrovascular accident (CVA), unspecified mechanism (HCC) [I63.9]  DISCHARGE DIAGNOSIS:  Acute vertigo Headache Hypertension Hyperlipidemia  SECONDARY DIAGNOSIS:   Past Medical History:  Diagnosis Date  . Arthritis   . Asthma   . Diabetes mellitus   . GERD (gastroesophageal reflux disease)   . Hyperlipidemia   . Hypertension   . Mini stroke (HCC)   . Stroke [facility]      ADMITTING HISTORY Destiny Savage  is a 68 y.o. female with a known history of TIA, diabetes and essential hypertension who presents today to the emergency room with chief complaint of dizziness.  Patient reports that since yesterday she has had a headache associated with dizziness and difficulty finding words.Symptoms have persisted through the night so she comes to the ER for further evaluation.Initial head CT shows no acute stroke however patient continues to have dizziness.  She denies blurred vision or double vision.  On physical exam her right side is weaker than the left.   HOSPITAL COURSE:  Was admitted to medical floor.  Neurology consultation was done.  Patient was worked up with MRI brain MRA head, CTA head and neck and carotid ultrasound.  MRI head and MRA brain was unremarkable.  Carotid ultrasound showed a moderate calcified plaque but no hemodynamically significant stenosis.  Patient currently on aspirin, statin medication and Plavix.  Discussed with neurology patient will be discharged home on above medications.  Started on meclizine and her vertigo improved.  Epley procedure was also done during the hospitalization and her symptoms improved.  CONSULTS  OBTAINED:  Treatment Team:  Thana Farr, MD  DRUG ALLERGIES:   Allergies  Allergen Reactions  . Latex Hives  . Lidocaine Hives  . Pb-Hyoscy-Atropine-Scopolamine Hives and Rash    DISCHARGE MEDICATIONS:   Allergies as of 01/09/2019      Reactions   Latex Hives   Lidocaine Hives   Pb-hyoscy-atropine-scopolamine Hives, Rash      Medication List    STOP taking these medications   ketorolac 10 MG tablet Commonly known as:  TORADOL   predniSONE 10 MG (21) Tbpk tablet Commonly known as:  STERAPRED UNI-PAK 21 TAB   traMADol 50 MG tablet Commonly known as:  ULTRAM     TAKE these medications   aspirin 81 MG tablet Take 1 tablet (81 mg total) by mouth daily.   butalbital-acetaminophen-caffeine 50-325-40 MG tablet Commonly known as:  FIORICET, ESGIC Take 1-2 tablets by mouth every 6 (six) hours as needed for headache.   carvedilol 6.25 MG tablet Commonly known as:  COREG Take 6.25 mg by mouth 2 (two) times daily with a meal.   clopidogrel 75 MG tablet Commonly known as:  PLAVIX Take 75 mg by mouth daily.   erythromycin ophthalmic ointment Place 1 application into both eyes daily.   fluticasone 50 MCG/ACT nasal spray Commonly known as:  FLONASE Place 1 spray into both nostrils daily.   furosemide 20 MG tablet Commonly known as:  LASIX TAKE 1 TABLET BY MOUTH ONCE DAILY   meclizine 12.5 MG tablet Commonly known as:  ANTIVERT Take 1 tablet (12.5 mg total) by mouth 3 (three) times daily for 14 days.  metFORMIN 500 MG tablet Commonly known as:  GLUCOPHAGE Take 1,000 mg by mouth daily.   omeprazole 20 MG capsule Commonly known as:  PRILOSEC Take 20 mg by mouth daily.   ondansetron 4 MG disintegrating tablet Commonly known as:  ZOFRAN-ODT Take 4 mg by mouth every 8 (eight) hours as needed for nausea.   potassium chloride 10 MEQ tablet Commonly known as:  K-DUR,KLOR-CON Take 10 mEq by mouth 2 (two) times daily.   PROAIR HFA 108 (90 Base) MCG/ACT  inhaler Generic drug:  albuterol Inhale 1 puff into the lungs every 6 (six) hours as needed for wheezing.   simvastatin 40 MG tablet Commonly known as:  ZOCOR Take 40 mg by mouth every evening.   triamcinolone cream 0.5 % Commonly known as:  KENALOG Apply 1 application topically 2 (two) times daily.       Today  Patient seen and evaluated today Decreased dizziness No complaints of any headache Tolerating diet well Hemodynamically stable Will be discharged home with home health services  VITAL SIGNS:  Blood pressure (!) 158/90, pulse 73, temperature 98.2 F (36.8 C), temperature source Oral, resp. rate 16, height  (1.753 m), weight 90.7 kg, SpO2 100 %.  I/O:    Intake/Output Summary (Last 24 hours) at 01/09/2019 1057 Last data filed at 01/09/2019 0404 Gross per 24 hour  Intake 260 ml  Output 1100 ml  Net -840 ml    PHYSICAL EXAMINATION:  Physical Exam  GENERAL:  68 y.o.-year-old patient lying in the bed with no acute distress.  LUNGS: Normal breath sounds bilaterally, no wheezing, rales,rhonchi or crepitation. No use of accessory muscles of respiration.  CARDIOVASCULAR: S1, S2 normal. No murmurs, rubs, or gallops.  ABDOMEN: Soft, non-tender, non-distended. Bowel sounds present. No organomegaly or mass.  NEUROLOGIC: Moves all 4 extremities. PSYCHIATRIC: The patient is alert and oriented x 3.  SKIN: No obvious rash, lesion, or ulcer.   DATA REVIEW:   CBC Recent Labs  Lab 01/07/19 1115  WBC 8.1  HGB 12.5  HCT 39.0  PLT 265    Chemistries  Recent Labs  Lab 01/07/19 1115  NA 137  K 3.9  CL 102  CO2 26  GLUCOSE 216*  BUN 14  CREATININE 0.79  CALCIUM 9.3  AST 17  ALT 11  ALKPHOS 77  BILITOT 0.6    Cardiac Enzymes No results for input(s): TROPONINI in the last 168 hours.  Microbiology Results  No results found for this or any previous visit.  RADIOLOGY:  Ct Head Wo Contrast  Result Date: 01/07/2019 CLINICAL DATA:  68 year old female  with a history of headache EXAM: CT HEAD WITHOUT CONTRAST TECHNIQUE: Contiguous axial images were obtained from the base of the skull through the vertex without intravenous contrast. COMPARISON:  11/24/2017, MR 12/16/2017 FINDINGS: Brain: No acute intracranial hemorrhage. No midline shift or mass effect. Gray-white differentiation maintained. Unremarkable appearance of the ventricular system. Vascular: Unremarkable. Skull: No acute fracture.  No aggressive bone lesion identified. Sinuses/Orbits: Unremarkable appearance of the orbits. Mastoid air cells clear. No middle ear effusion. No significant sinus disease. Other: None IMPRESSION: Negative head CT Electronically Signed   By: Gilmer Mor D.O.   On: 01/07/2019 11:34   Ct Angio Neck W Or Wo Contrast  Result Date: 01/08/2019 CLINICAL DATA:  Dizziness and speech difficulties since yesterday, headache. History of hypertension, hyperlipidemia, diabetes and stroke. EXAM: CT ANGIOGRAPHY NECK TECHNIQUE: Multidetector CT imaging of the neck was performed using the standard protocol during bolus administration of  intravenous contrast. Multiplanar CT image reconstructions and MIPs were obtained to evaluate the vascular anatomy. Carotid stenosis measurements (when applicable) are obtained utilizing NASCET criteria, using the distal internal carotid diameter as the denominator. CONTRAST:  41mLorin PickMaryla MorCorSurgery Center Of36 PoClinical research assocCox Medical Cente[address]187 HillcrL5838wAllegheny Valley Hospi736851m6Lorin PickMaryla MorCorMargaretville Mem34oriClinical research assocBloomington Endosco[address] LivingstonL8820wClifton Surgery Center 755863m6Lorin PickMaryla MorCorWinn Army Comm64uniClinical research assocJustice Med Surg C83[address] 90 South Valley FarL1638wCentral Endoscopy Cen779819m6Lorin PickMaryla MorCorPhoenix68vilClinical research assocMercy Medic417 [address] [place] D17 West SummL342wEndoscopy Center Of Centralia Digestive Health Partn768870m6Lorin PickMaryla MorCorSt. 12VinClinical research assocJohn & Mary Kirby13 [place] 473 ColonL929wCheyenne Surgical Center 749854m6Lorin PickMaryla MorCorUspi Memorial 49SurClinical research assocAdvanced Pain Surgical C543 Indian Summer D94 EdgewaL6937wBlanchfield Army Community Hospi758846m6Lorin PickMaryla MorCorMid Columbia Endosc41opyClinical research assocAmbulatory Center For Endo12 Fifth 180 Old YL3342wOrthopedic Associates Surgery Cen750824m6Lorin PickMaryla MorCorDaviess Comm72uniClinical research assocThe Endoscopy Center [address]39 AltoL2681wPauls Valley General Hospi769870m6Lorin PickMaryla MorCorBrunswick Hospit15al Clinical research assocWasatch Front Surgery C183 York10 Beaver RidL5987wUnited Surgery Cen728881m6Lorin PickMaryla MorCorWellmont Mountain View Regional 91MedClinical research assocWartburg Surge61 [address]. Young8435 EdgefieL4785wSt Josephs Hospi736843m6Lorin PickMaryla MorCorGreenbelt Urology86 InClinical research assocWashington Surgery C12 Arcadia7 ValleyL759wTriumph Hospital Central Hous754846m6Lorin PickMaryla MorCorSoutheastern Regional 45MedClinical research assocCasey County9283 Campfire Ci7528 SprL3166wPioneer Specialty Hospi739885m6Lorin PickMaryla MorCorBeverly Hills Multispecialty Surgi52calClinical research assocSandy Springs Center For Urologi95[address] F[place]40 Indian SumL5649wSaint Lukes South Surgery Center 764816m6Lorin PickMaryla MorCorHoly S29pirClinical research assocNicklaus Children'S9[address]209 HowL2870wUpmc Chautauqua At 785857m6Lorin PickMaryla MorCorThe Cente59r FClinical research assocEye Surgery Center Nort391 [place] 88 RosL3856wContinuecare Hospital At Hendrick Medical Cen721825m6Lorin PickMaryla MorCorEssentia Healt31h SClinical research assocHeal32 [place] D71 ConstitutiL3890wShore Medical Cen780820Healthsouth RehabilitatiL884269m6Lorin PickMaryla MorCorOak Tree Surg72eryClinical research assocChildren'S Hospital Of Sa8[address]70 GolfL7417wSouthern Surgery Cen75850m6Lorin PickMaryla MorCorWheatland Memor36ialClinical research assocSt Charles P39 [address] 57 GlenholmL1415wMartinsburg Va Medical Cen765848m6Lorin PickMaryla MorCorAlton Mem73oriClinical research assocUnm Ahf Primary Ca8953 Olive 655 Old RockcresL275wDreyer Medical Ambulatory Surgery Cen760821m6Lorin PickMaryla MorCorSummit Surgery Centere S35t MClinical research assocCherry County944 North Airport D588 Golden SL7726wPearl Surgicenter 717821m6Lorin PickMaryla MorCorEllis85 HeClinical research assocThe Carle Foundation153 Birchpond C9 GalvL609wSanta Monica - Ucla Medical Center & Orthopaedic Hospi722886m6Lorin PickMaryla MorCorAdventhealth Surgery Center31 WeClinical research assocEncompass Health Rehabilitation Hospital Of Northwe8679 Dogwood642 HarriL474wValir Rehabilitation Hospital Of 76484m6Lorin PickMaryla MorCorWes28terClinical research assocProvidence Milwaukie[address]883 MiL647wPark Bridge Rehabilitation And Wellness Cen739823m6Lorin PickMaryla MorCorChi St Lukes Healt91h -Clinical research assocMcleod Medical Center-D9437 Military7362 FoxrL2834wDignity Health Chandler Regional Medical Cen721873m6Lorin PickMaryla MorCorWichita Va 59MedClinical research assocRipo329 Jockey Hollow C7506 PrincetoL6665wBanner Union Hills Surgery Cen744869m6Lorin PickMaryla MorCorSharp [place] 67MedClinical research asso80 Goldfield C1 S. 1stL7826wBrockton Endoscopy Surgery Center760895m6Lorin PickMaryla MorCorVidant Bea78ufoClinical research assocCarroll Hospit385 Plumb Branch36 CroL4241wHuntington Ambulatory Surgery Cen778833m6Lorin PickMaryla MorCorThe Surgery Center 64At Clinical research assocCentracare Health M637 [address]. Willow7865 WestportL6231wPortland Va Medical Cen76584696295nnah BeateEXOL 350 MG/ML SOLN COMPARISON:  MRA head January 07, 2019 FINDINGS: AORTIC ARCH: Normal appearance of the thoracic arch, 2 vessel arch is a normal variant. Mild calcific atherosclerosis aortic arch. The origins of the innominate, left Common carotid artery and subclavian artery are widely patent. RIGHT CAROTID SYSTEM: Common carotid artery is widely patent, mild calcific atherosclerosis. Moderate calcific atherosclerosis of the carotid bifurcation without hemodynamically significant stenosis by NASCET criteria. Tortuous patent ICA with fold. LEFT CAROTID SYSTEM:  Common carotid artery is patent, mild calcific atherosclerosis. Calcific atherosclerosis distal common carotid artery to carotid bifurcation resulting in moderate stenosis distal LEFT CCA. No hemodynamically significant stenosis LEFT ICA by NASCET criteria. Without hemodynamically significant stenosis by NASCET criteria. Normal appearance of the included internal carotid artery. VERTEBRAL ARTERIES:Severe stenosis LEFT vertebral artery origin. Patent bilateral vertebral arteries. SKELETON: No acute osseous process though bone windows have not been submitted. Cervical spondylosis better demonstrated on MRI of the cervical spine today, reported separately. OTHER NECK: Soft tissues of the neck are nonacute though, not tailored for evaluation. UPPER CHEST: Included lung apices are clear. No superior mediastinal lymphadenopathy. IMPRESSION: 1. No hemodynamically significant stenosis ICA's. 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 2. Patent vertebral arteries. Moderate stenosis LEFT vertebral artery origin. Aortic Atherosclerosis (ICD10-I70.0). Electronically Signed   By: Courtnay  Bloomer M.D.   On: 01/08/2019 16:39   Mr Brain Wo Contrast  Result Date: 01/07/2019 CLINICAL DATA:  Dizziness since yesterday, headache. History of hypertension, hyperlipidemia, diabetes and stroke. EXAM: MRI HEAD WITHOUT CONTRAST MRA HEAD WITHOUT CONTRAST TECHNIQUE: Multiplanar, multiecho pulse sequences of the brain and surrounding structures were obtained without intravenous contrast. Angiographic images of the head were obtained using MRA technique without contrast. COMPARISON:  CT HEAD January 07, 2019 and MRI of the head December 16, 2017 and MRA head January 11, 2017. FINDINGS: MRI HEAD FINDINGS INTRACRANIAL CONTENTS: No reduced diffusion to suggest acute ischemia. No susceptibility artifact to suggest hemorrhage. No parenchymal brain volume loss for age. No hydrocephalus. Faint nonspecific supratentorial white matter  FLAIR T2 hyperintensities compatible with early chronic small vessel ischemic changes, normal for age. No suspicious parenchymal signal, masses, mass effect. No abnormal extra-axial fluid collections. No extra-axial masses. VASCULAR: Normal major intracranial vascular flow voids present at skull base. SKULL AND UPPER CERVICAL SPINE: No abnormal sellar expansion. Low signal C 4 vertebral body seen only on sagittal T1 sequence, nonoccluded on prior MRI. Craniocervical junction maintained. SINUSES/ORBITS: Minimal ethmoid mucosal thickening. No paranasal sinus air-fluid levels. Mastoid air cells are well aerated.The included ocular globes and orbital contents are non-suspicious. OTHER: None. MRA HEAD FINDINGS ANTERIOR CIRCULATION: Normal flow related enhancement of the included cervical, petrous, cavernous and supraclinoid internal carotid arteries. Patent anterior communicating artery. Patent anterior and middle cerebral arteries. No large vessel occlusion, flow limiting stenosis, aneurysm. POSTERIOR CIRCULATION: Codominant vertebral arteries. Vertebrobasilar arteries are patent, with normal flow related enhancement of the main branch vessels. Patent posterior cerebral arteries. Predominately fetal origin bilateral posterior cerebral arteries. No large vessel occlusion, flow limiting stenosis,  aneurysm. ANATOMIC VARIANTS: None. Source images and MIP images were reviewed. IMPRESSION: MRI HEAD: 1. No acute intracranial process; negative noncontrast MRI head for age. 2. Abnormal signal C4 vertebral body may be artifact; consider non emergent CT versus MRI cervical spine. MRA HEAD:  1. No emergent large vessel occlusion or flow-limiting stenosis. Electronically Signed   By: Awilda Metro M.D.   On: 01/07/2019 22:42   Mr Cervical Spine Wo Contrast  Result Date: 01/08/2019 CLINICAL DATA:  Dizziness. History of prior stroke. Abnormal signal is seen in the C4 vertebral body on brain MRI yesterday. EXAM: MRI CERVICAL  SPINE WITHOUT CONTRAST TECHNIQUE: Multiplanar, multisequence MR imaging of the cervical spine was performed. No intravenous contrast was administered. COMPARISON:  Cervical spine MRI 04/19/2012.  Brain MRI 01/07/2019. FINDINGS: Alignment: Straightening of lordosis is unchanged. Vertebrae: There is no fracture. Since the prior cervical MRI, the patient has developed marked degenerative endplate signal change at C4-5 with small Schmorl's nodes in the endplates. Additional scattered small Schmorl's nodes are noted. Cord: Normal signal throughout. Posterior Fossa, vertebral arteries, paraspinal tissues: Negative. Disc levels: C2-3: Minimal posterior bony ridging and mild facet degenerative disease. No stenosis. C3-4: There is some ligamentum flavum thickening, a shallow disc bulge and some uncovertebral spurring. Mild central canal narrowing is present. Moderate to moderately severe foraminal narrowing is worse on the right. Spondylosis shows some progression since the prior MRI. C4-5: There is a shallow disc bulge and uncovertebral disease. The ventral thecal sac is effaced. Moderate to moderately severe foraminal narrowing is worse on the left. Spondylosis has progressed since the prior exam. C5-6: Minimal disc bulge and left worse than right uncovertebral disease. The central canal is open. Moderate to moderately severe foraminal narrowing is worse on the left. Spondylosis has progressed since the prior exam. C6-7: There is a shallow broad-based disc bulge and some uncovertebral disease. Ligamentum flavum thickening is noted. Mild deformity of the ventral cord is more notable on the left. Mild bilateral foraminal narrowing is present. The appearance is not notably changed. C7-T1: There is a shallow disc bulge and some uncovertebral disease. Ligamentum flavum thickening is seen. The central canal is open. Mild to moderate foraminal narrowing is worse on the left. The appearance is unchanged. IMPRESSION: Marked  degenerative endplate signal change at C4-5 accounts for signal abnormality seen on brain MRI yesterday and is new since the patient's 2013 cervical spine MRI. Some progression of spondylosis at C3-4, C4-5 and C5-6. Moderate to moderately severe foraminal narrowing at each of these levels is worse on the left. Mild central canal narrowing is present at C3-4 and C4-5. Mild deformity of the ventral cord at C6-7 and mild bilateral foraminal narrowing are unchanged. Electronically Signed   By: Drusilla Kanner M.D.   On: 01/08/2019 11:54   US Carotid Bilateral (at [facility] Only)  Result Date: 01/07/2019 CLINICAL DATA:  68 year old female with a history of cerebrovascular accident EXAM: BILATERAL CAROTID DUPLEX ULTRASOUND TECHNIQUE: Wallace Cullens scale imaging, color Doppler and duplex ultrasound were performed of bilateral carotid and vertebral arteries in the neck. COMPARISON:  01/11/2017 FINDINGS: Criteria: Quantification of carotid stenosis is based on velocity parameters that correlate the residual internal carotid diameter with NASCET-based stenosis levels, using the diameter of the distal internal carotid lumen as the denominator for stenosis measurement. The following velocity measurements were obtained: RIGHT ICA:  Systolic 80 cm/sec, Diastolic 29 cm/sec CCA:  47 cm/sec SYSTOLIC ICA/CCA RATIO:  1.7 ECA:  50 cm/sec LEFT ICA:  Systolic 99 cm/sec, Diastolic 37 cm/sec CCA:  58 cm/sec SYSTOLIC ICA/CCA RATIO:  1.2 ECA:  117 cm/sec Right Brachial SBP: Not acquired Left Brachial SBP: Not acquired RIGHT CAROTID ARTERY: No significant calcifications of the right common carotid artery. Intermediate waveform maintained. Moderate heterogeneous and partially  calcified plaque at the right carotid bifurcation. No significant lumen shadowing. Low resistance waveform of the right ICA. No significant tortuosity. RIGHT VERTEBRAL ARTERY: Antegrade flow with low resistance waveform. LEFT CAROTID ARTERY: No significant calcifications of  the left common carotid artery. Intermediate waveform maintained. Moderate heterogeneous and partially calcified plaque at the left carotid bifurcation. No significant lumen shadowing. Low resistance waveform of the left ICA. No significant tortuosity. LEFT VERTEBRAL ARTERY:  Antegrade flow with low resistance waveform. IMPRESSION: Color duplex indicates moderate heterogeneous and calcified plaque, with no hemodynamically significant stenosis by duplex criteria in the extracranial cerebrovascular circulation. Signed, Yvone Neu. Reyne Dumas, RPVI [facility] Radiology Electronically Signed   By: Gilmer Mor D.O.   On: 01/07/2019 16:42   Mr Maxine Glenn Head/brain RU Cm  Result Date: 01/07/2019 CLINICAL DATA:  Dizziness since yesterday, headache. History of hypertension, hyperlipidemia, diabetes and stroke. EXAM: MRI HEAD WITHOUT CONTRAST MRA HEAD WITHOUT CONTRAST TECHNIQUE: Multiplanar, multiecho pulse sequences of the brain and surrounding structures were obtained without intravenous contrast. Angiographic images of the head were obtained using MRA technique without contrast. COMPARISON:  CT HEAD January 07, 2019 and MRI of the head December 16, 2017 and MRA head January 11, 2017. FINDINGS: MRI HEAD FINDINGS INTRACRANIAL CONTENTS: No reduced diffusion to suggest acute ischemia. No susceptibility artifact to suggest hemorrhage. No parenchymal brain volume loss for age. No hydrocephalus. Faint nonspecific supratentorial white matter FLAIR T2 hyperintensities compatible with early chronic small vessel ischemic changes, normal for age. No suspicious parenchymal signal, masses, mass effect. No abnormal extra-axial fluid collections. No extra-axial masses. VASCULAR: Normal major intracranial vascular flow voids present at skull base. SKULL AND UPPER CERVICAL SPINE: No abnormal sellar expansion. Low signal C 4 vertebral body seen only on sagittal T1 sequence, nonoccluded on prior  MRI. Craniocervical junction maintained. SINUSES/ORBITS: Minimal ethmoid mucosal thickening. No paranasal sinus air-fluid levels. Mastoid air cells are well aerated.The included ocular globes and orbital contents are non-suspicious. OTHER: None. MRA HEAD FINDINGS ANTERIOR CIRCULATION: Normal flow related enhancement of the included cervical, petrous, cavernous and supraclinoid internal carotid arteries. Patent anterior communicating artery. Patent anterior and middle cerebral arteries. No large vessel occlusion, flow limiting stenosis, aneurysm. POSTERIOR CIRCULATION: Codominant vertebral arteries. Vertebrobasilar arteries are patent, with normal flow related enhancement of the main branch vessels. Patent posterior cerebral arteries. Predominately fetal origin bilateral posterior cerebral arteries. No large vessel occlusion, flow limiting stenosis,  aneurysm. ANATOMIC VARIANTS: None. Source images and MIP images were reviewed. IMPRESSION: MRI HEAD: 1. No acute intracranial process; negative noncontrast MRI head for age. 2. Abnormal signal C4 vertebral body may be artifact; consider non emergent CT versus MRI cervical spine. MRA HEAD: 1. No emergent large vessel occlusion or flow-limiting stenosis. Electronically Signed   By: Awilda Metro M.D.   On: 01/07/2019 22:42    Follow up with PCP in 1 week.  Management plans discussed with the patient, family and they are in agreement.  CODE STATUS: Full code    Code Status Orders  (From admission, onward)         Start     Ordered   01/07/19 1305  Full code  Continuous     01/07/19 1306        Code Status History    Date Active Date Inactive Code Status Order ID Comments User Context   01/10/2017 2224 01/11/2017 2150 Full Code [id number]  Oralia Manis, MD Inpatient   12/24/2015 0055 12/24/2015 1643 Full Code [id number]  Anne Hahn,  Onalee Hua, MD Inpatient      TOTAL TIME TAKING CARE OF THIS PATIENT ON DAY OF DISCHARGE: more than 35 minutes.   Ihor Austin M.D on 01/09/2019 at 10:57 AM  Between 7am to 6pm - Pager - [phone]  After 6pm go to [email or web address] - password [email or web address] [facility]  [facility]  Office  [phone]  CC: Primary care physician; Jaclyn Shaggy, MD  Note: This dictation was prepared with Dragon dictation along with smaller phrase technology. Any transcriptional errors that result from this process are unintentional.

## 2019-01-09 NOTE — Care Management Note (Signed)
Case Management Note  Patient Details  Name: Destiny Savage MRN: [id number] Date of Birth: Oct 24, 1951  Subjective/Objective:  Patient to be discharged per MD order. Orders in place for home health services. Patient lives with her spouse and agreeable to home health. CMS Medicare.gov Compare Post Acute Care list reviewed with patient and they are without preference. Referral placed with Jermaine at [facility]. There are no DME needs. Spouse to transport.                 Action/Plan:   Expected Discharge Date:  01/09/19               Expected Discharge Plan:  Home w Home Health Services  In-House Referral:     Discharge planning Services  CM Consult  Post Acute Care Choice:    Choice offered to:  Patient  DME Arranged:    DME Agency:     HH Arranged:  RN, PT, OT HH Agency:  [facility]  Status of Service:  Completed, signed off  If discussed at Long Length of Stay Meetings, dates discussed:    Additional Comments:  Virgel Manifold, RN 01/09/2019, 11:10 AM

## 2019-01-12 ENCOUNTER — Telehealth: Payer: Self-pay

## 2019-01-12 LAB — ECHOCARDIOGRAM COMPLETE
Height: 69 in
WEIGHTICAEL: 3200 [oz_av]

## 2019-01-12 NOTE — Telephone Encounter (Signed)
Patient returned call.  Stated she did not rest well last night. Discharged on meclizine, however when she took her first dose she experienced a serious headache.  She made her home health nurse and daughter (LPN) aware, both informed her not to take anymore as headaches are a side effect.  Per AVS, she is to follow up with Dr. Dewaine Oats.  Patient attempted to call earlier today, though office closed for lunch.  Will attempt this afternoon.  Encouraged her to call Dr. Maree Krabbe office or her home health agency for any questions or concerns that come up. Encouraged her to make Dr. Arlana Pouch aware of the side effect experienced with meclizine.   No other questions or concerns.  I thanked her for her time and informed her she would receive one more automated call checking in over the next few days.

## 2019-01-12 NOTE — Telephone Encounter (Signed)
Flagged on EMMI report for not having a follow up scheduled and having other questions or problems.  First attempt to reach patient made, however unable to reach patient.  Left voicemail encouraging callback. Will attempt at later time.

## 2019-05-08 ENCOUNTER — Other Ambulatory Visit: Payer: Self-pay | Admitting: Cardiovascular Disease

## 2019-05-10 NOTE — Telephone Encounter (Signed)
Left message requesting for patient to call back to schedule overdue follow-up appointment with Dr. Rockey Situ.

## 2019-05-12 NOTE — Progress Notes (Signed)
Cardiology Office Note Date:  05/18/2019  Patient ID:  Destiny Savage, Destiny Savage 1950-12-31, MRN [id number] PCP:  Albina Billet, MD  Cardiologist:  Dr. Rockey Situ, MD    Chief Complaint: Follow-up  History of Present Illness: Destiny Savage is a 68 y.o. female with history of nonobstructive disease by remote cardiac cath in 11/2001 by outside cardiology group, TIA, diabetes, chronic nausea, vomiting, and diarrhea, carotid artery disease medically managed, hypertension, hyperlipidemia, OSA noncompliant with CPAP, and migraine disorder who presents for follow-up.  Most recent ischemic evaluation via Myoview in 12/2015 that was low risk with an EF of 55 to 65%.  Echo at that time showed an EF of 60 to 65%, normal wall motion, grade 1 diastolic dysfunction, mild mitral regurgitation, left atrium normal in size, RV systolic function normal, PASP normal.  She was admitted to the hospital in 12/2016 with headache, nausea, and paresthesias in the legs.  CT head without acute findings.  MRI with small vessel disease, MRI of the lumbar spine with mild degenerative disc disease, carotid artery ultrasound less than 49% bilateral stenosis.  Echo showed an EF of 60 to 65%, normal wall motion, normal diastolic function, no significant valvular abnormality.  She was most recently seen in the office in 01/2018 for follow-up of chest pain and noted increased stress at home.  No changes were made.  She was admitted to the hospital in 12/2018 with dizziness felt to be related to TIA versus vertigo.  CT head was negative.  CTA neck showed no hemodynamically significant stenosis of the internal carotid arteries with moderate stenosis involving the distal left common carotid artery, patent vertebral arteries with moderate stenosis of the left vertebral artery origin, and aortic atherosclerosis.  Carotid artery ultrasound demonstrated moderate calcified plaque with no hemodynamically significant stenosis.  MRI of the brain showed no  acute intracranial process.  MRA of the head was without emergent large vessel occlusion or flow-limiting stenosis.  MRI of the cervical spine showed significant degenerative changes.  Echo showed an EF of greater than 40%, diastolic dysfunction, no regional wall motion normalities, normal RV systolic function, no significant valvular abnormalities.  She was continued on dual antiplatelet therapy with aspirin and Plavix on the recommendation of inpatient neurology.  Outpatient neurology follow-up was recommended.  Labs: 12/2018 -LDL 85, A1c, 7.4, potassium 3.9, serum creatinine 0.79, albumin 3.9, AST/ALT normal, Hgb 12.5  She comes in doing well from a cardiac perspective.  She denies any chest pain, shortness of breath, palpitations, dizziness, presyncope, or syncope.  She denies any lower extremity swelling, abdominal distention, orthopnea, PND, early satiety.  She continues to be noncompliant with her CPAP and in the setting notes excessive daytime somnolence.  She has not followed up with neurology as directed during her hospitalization in 12/2018 though remains compliant with dual antiplatelet therapy as recommended by inpatient neurology.  Blood pressure remains well controlled.  No falls, BRBPR, or melena.  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  She states she [place] [address] and the next time she recalls is looking out her window and seeing beautiful flowers and tobacco.  In the setting she realized she had made a wrong turn and turned around to get back on [address].  She states it is as if "someone pulled a curtain over my eyes."  Apparently, she continued to drive during this uncertain episode  and did not hit anything or suffer any damage to her car.  She indicates remembering driving around some construction workers in the road and staying in her lane.  She continued to drive onto her panel lessons  and subsequently back home without issues.  She has continued to drive since.  She drove to her appointment today with her husband riding with her.  She denies any symptoms of chest pain or palpitations around the surrounding event.  She denies any weakness or change in sensation of her upper or lower extremities.  She denies any slurred speech.  She is currently asymptomatic and at her baseline.  Past Medical History:  Diagnosis Date   Arthritis    Asthma    Diabetes mellitus    GERD (gastroesophageal reflux disease)    Hyperlipidemia    Hypertension    Mini stroke (HCC)    Stroke [facility](HCC)     Past Surgical History:  Procedure Laterality Date   ABDOMINAL HYSTERECTOMY     BREAST EXCISIONAL BIOPSY Left 1980's   benign   CARDIAC CATHETERIZATION     CHOLECYSTECTOMY      Current Meds  Medication Sig   albuterol (PROAIR HFA) 108 (90 Base) MCG/ACT inhaler Inhale 1 puff into the lungs every 6 (six) hours as needed for wheezing.   aspirin 81 MG tablet Take 1 tablet (81 mg total) by mouth daily.   butalbital-acetaminophen-caffeine (FIORICET, ESGIC) 50-325-40 MG tablet Take 1-2 tablets by mouth every 6 (six) hours as needed for headache.   carvedilol (COREG) 6.25 MG tablet Take 6.25 mg by mouth 2 (two) times daily with a meal.   clopidogrel (PLAVIX) 75 MG tablet Take 75 mg by mouth daily.   furosemide (LASIX) 20 MG tablet Take 1 tablet by mouth once daily   metFORMIN (GLUCOPHAGE) 500 MG tablet Take 1,000 mg by mouth daily.   omeprazole (PRILOSEC) 20 MG capsule Take 20 mg by mouth daily.   ondansetron (ZOFRAN-ODT) 4 MG disintegrating tablet Take 4 mg by mouth every 8 (eight) hours as needed for nausea.   potassium chloride (K-DUR,KLOR-CON) 10 MEQ tablet Take 10 mEq by mouth 2 (two) times daily.   simvastatin (ZOCOR) 40 MG tablet Take 40 mg by mouth every evening.   triamcinolone cream (KENALOG) 0.5 % Apply 1 application topically 2 (two) times daily.    Allergies:    Latex, Lidocaine, and Pb-hyoscy-atropine-scopolamine   Social History:  The patient  reports that she has never smoked. She has never used smokeless tobacco. She reports that she does not drink alcohol or use drugs.   Family History:  The patient's family history includes CAD in an other family member; Heart Problems in her father; Hypertension in an other family member.  ROS:   Review of Systems  Constitutional: Positive for malaise/fatigue. Negative for chills, diaphoresis, fever and weight loss.  HENT: Negative for congestion.   Eyes: Negative for discharge and redness.       Patient uncertain if there was transient loss of vision approximately 1 week prior while driving  Respiratory: Negative for cough, hemoptysis, sputum production, shortness of breath and wheezing.   Cardiovascular: Negative for chest pain, palpitations, orthopnea, claudication, leg swelling and PND.  Gastrointestinal: Negative for abdominal pain, blood in stool, heartburn, melena, nausea and vomiting.  Genitourinary: Negative for hematuria.  Musculoskeletal: Negative for falls and myalgias.  Skin: Negative for rash.  Neurological: Negative for dizziness, tingling, tremors, sensory change, speech change, focal weakness, loss of consciousness and weakness.  Endo/Heme/Allergies:  Does not bruise/bleed easily.  Psychiatric/Behavioral: Negative for substance abuse. The patient is nervous/anxious and has insomnia.   All other systems reviewed and are negative.    PHYSICAL EXAM:  VS:  BP 120/70 (BP Location: Left Arm, Patient Position: Sitting, Cuff Size: Large)    Pulse 74    Ht 5\' 5"  (1.651 m)    Wt 221 lb 8 oz (100.5 kg)    BMI 36.86 kg/m  BMI: Body mass index is 36.86 kg/m.  Physical Exam  Constitutional: She is oriented to person, place, and time. She appears well-developed and well-nourished.  HENT:  Head: Normocephalic and atraumatic.  Eyes: Right eye exhibits no discharge. Left eye exhibits no discharge.   Neck: Normal range of motion. No JVD present.  Cardiovascular: Normal rate, regular rhythm, S1 normal, S2 normal and normal heart sounds. Exam reveals no distant heart sounds, no friction rub, no midsystolic click and no opening snap.  No murmur heard. Pulses:      Posterior tibial pulses are 2+ on the right side and 2+ on the left side.  Pulmonary/Chest: Effort normal and breath sounds normal. No respiratory distress. She has no decreased breath sounds. She has no wheezes. She has no rales. She exhibits no tenderness.  Abdominal: Soft. She exhibits no distension. There is no abdominal tenderness.  Musculoskeletal:        General: No edema.  Neurological: She is alert and oriented to person, place, and time.  Skin: Skin is warm and dry. No cyanosis. Nails show no clubbing.  Psychiatric: She has a normal mood and affect. Her speech is normal and behavior is normal. Judgment and thought content normal.     EKG:  Was ordered and interpreted by me today. Shows NSR, 74 bpm, normal axis, no acute ST-T changes  Recent Labs: 01/07/2019: ALT 11; BUN 14; Creatinine, Ser 0.79; Hemoglobin 12.5; Platelets 265; Potassium 3.9; Sodium 137  01/08/2019: Cholesterol 153; HDL 50; LDL Cholesterol 85; Total CHOL/HDL Ratio 3.1; Triglycerides 89; VLDL 18   CrCl cannot be calculated (Patient's most recent lab result is older than the maximum 21 days allowed.).   Wt Readings from Last 3 Encounters:  05/18/19 221 lb 8 oz (100.5 kg)  01/07/19 200 lb (90.7 kg)  06/03/18 215 lb (97.5 kg)     Other studies reviewed: Additional studies/records reviewed today include: summarized above  ASSESSMENT AND PLAN:  1. Question of vision disturbance versus daytime somnolence versus TIA/neurologic disturbance: Uncertain etiology.  Patient provides a history initially of potential syncope/LOC though states she recalls driving around [place] representative[place] workers and maintaining her lane without injury to others or property.  She  denies any symptoms of angina, dyspnea, or palpitations surrounding the event.  Episode does not sound cardiac in etiology though we will place a ZIO monitor for further evaluation.  Recent echo and carotid artery ultrasound unrevealing as outlined above.  She has been referred to neurology for further evaluation, as was previously directed during her admission in 12/2018.  She has been advised she cannot drive until she is cleared by neurology.  Her husband is here with her today and will drive home.  2. Possible TIA: As noted during her admission in 12/2018.  Prior MRI of the brain has not shown evidence of prior stroke.  Remains on aspirin and Plavix as directed by PCP/inpatient neurology.  She has been referred to outpatient neurology as outlined above.  3. Diastolic dysfunction: She appears euvolemic and well compensated.  Continue current dose of Lasix.  4. Sleep apnea: Noncompliant with CPAP.  Cannot exclude potential somnolence playing a role in #1.  I recommended she follow-up with pulmonology, she declines.  Perhaps this can be revisited when she follows up with neurology.  Compliance of CPAP was stressed.  Her on treated sleep apnea is likely playing a role in her daytime fatigue/somnolence.  5. Hyperlipidemia: Continue simvastatin.  Followed by PCP.  6. Hypertension: Blood pressures well controlled today.  Continue current medications as managed by PCP.  Disposition: F/u with Dr. Mariah MillingGollan or an APP in 2 months.  Current medicines are reviewed at length with the patient today.  The patient did not have any concerns regarding medicines.  Signed, Eula Listenyan Makya Phillis, PA-C 05/18/2019 3:46 PM     [facility] [address] [place], [place] [address] [phone]

## 2019-05-13 ENCOUNTER — Telehealth: Payer: Self-pay | Admitting: *Deleted

## 2019-05-13 NOTE — Telephone Encounter (Signed)

## 2019-05-18 ENCOUNTER — Ambulatory Visit (INDEPENDENT_AMBULATORY_CARE_PROVIDER_SITE_OTHER): Payer: Medicare Other | Admitting: Physician Assistant

## 2019-05-18 ENCOUNTER — Ambulatory Visit (INDEPENDENT_AMBULATORY_CARE_PROVIDER_SITE_OTHER): Payer: Medicare Other

## 2019-05-18 ENCOUNTER — Other Ambulatory Visit: Payer: Self-pay

## 2019-05-18 ENCOUNTER — Encounter: Payer: Self-pay | Admitting: Physician Assistant

## 2019-05-18 VITALS — BP 120/70 | HR 74 | Ht 65.0 in | Wt 221.5 lb

## 2019-05-18 DIAGNOSIS — H539 Unspecified visual disturbance: Secondary | ICD-10-CM

## 2019-05-18 DIAGNOSIS — R400 Somnolence: Secondary | ICD-10-CM

## 2019-05-18 DIAGNOSIS — G4730 Sleep apnea, unspecified: Secondary | ICD-10-CM

## 2019-05-18 DIAGNOSIS — G459 Transient cerebral ischemic attack, unspecified: Secondary | ICD-10-CM | POA: Diagnosis not present

## 2019-05-18 DIAGNOSIS — I10 Essential (primary) hypertension: Secondary | ICD-10-CM

## 2019-05-18 DIAGNOSIS — E782 Mixed hyperlipidemia: Secondary | ICD-10-CM

## 2019-05-18 DIAGNOSIS — I5189 Other ill-defined heart diseases: Secondary | ICD-10-CM

## 2019-05-18 NOTE — Patient Instructions (Signed)
Medication Instructions:  Your physician recommends that you continue on your current medications as directed. Please refer to the Current Medication list given to you today.  If you need a refill on your cardiac medications before your next appointment, please call your pharmacy.   Lab work: None ordered  If you have labs (blood work) drawn today and your tests are completely normal, you will receive your results only by: Marland Kitchen MyChart Message (if you have MyChart) OR . A paper copy in the mail If you have any lab test that is abnormal or we need to change your treatment, we will call you to review the results.  Testing/Procedures: 1- A zio monitor was placed today. It will remain on for 14 days. You will then return monitor and event diary in provided box. It takes 1-2 weeks for report to be downloaded and returned to [place]. We will call you with the results. If monitor falls of or has orange flashing light, please call Zio for further instructions.     Follow-Up: At [facility], you and your health needs are our priority.  As part of our continuing mission to provide you with exceptional heart care, we have created designated Provider Care Teams.  These Care Teams include your primary Cardiologist (physician) and Advanced Practice Providers (APPs -  Physician Assistants and Nurse Practitioners) who all work together to provide you with the care you need, when you need it. You will need a follow up appointment in 2 months.  You may see Dr. Rockey Situ or Christell Faith, PA-C.   Any Other Special Instructions Will Be Listed Below (If Applicable). 1- Ref to Neuro   No driving until you are cleared by neuro.

## 2019-05-25 DIAGNOSIS — H539 Unspecified visual disturbance: Secondary | ICD-10-CM

## 2019-05-26 ENCOUNTER — Telehealth: Payer: Self-pay | Admitting: Cardiovascular Disease

## 2019-05-26 NOTE — Telephone Encounter (Signed)
°  1. Is this related to a heart monitor you are wearing?   yes   2. What is your issue? Zio calling to say patient had skin irritation and itching .  Patient was removed and mailed to zio    Please route to covering RN/CMA/RMA for results. Route to monitor technicians or your monitor tech representative for your site for any technical concerns

## 2019-05-27 NOTE — Telephone Encounter (Signed)
Noted.  If heart monitor is unrevealing I recommend she follow-up with PCP as there may be some underlying dementia.

## 2019-05-27 NOTE — Telephone Encounter (Signed)
Call to patient in regards to zio. Pt had issues with rash and itching. She reports that she was able to wear it for 7 days.   Device sent back yesterday.   I let patient know that we will call her with results as soon as they are available.   Advised pt to call for any further questions or concerns.

## 2019-06-02 ENCOUNTER — Telehealth: Payer: Self-pay | Admitting: Cardiovascular Disease

## 2019-06-02 NOTE — Telephone Encounter (Signed)
Patient states she had had legs cramps for the last 3 days. States when she gets up and takes a few steps she has severe pain. Please call to discuss.

## 2019-06-03 NOTE — Telephone Encounter (Signed)
Lm to call back ./cy 

## 2019-06-04 NOTE — Telephone Encounter (Signed)
I called spoke with the patient. She has been having intermittent leg cramping for "a little while." She states she has had low magnesium in the past. She is currently taking magnesium 250 mg BID & potassium 10 meq BID.  She has called her PCP and is due for lab work Monday.   I have advised to have her lab work done and follow up with her PCP.  She is agreeable.

## 2019-06-23 ENCOUNTER — Telehealth: Payer: Self-pay

## 2019-06-23 NOTE — Telephone Encounter (Signed)
-----   Message from Rise Mu, PA-C sent at 06/22/2019 12:59 PM EDT ----- Monitor showed NSR with an average rate of 73 bpm, rare PACs and PVCs. Patient triggered events were not associated with significant arrhythmia. Reassuring study.

## 2019-06-23 NOTE — Telephone Encounter (Signed)
Patient made aware of monitor results with verbalized understanding.  °
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
## 2019-06-25 ENCOUNTER — Encounter: Payer: Self-pay | Admitting: Neurology

## 2019-06-25 ENCOUNTER — Other Ambulatory Visit: Payer: Self-pay

## 2019-06-25 ENCOUNTER — Ambulatory Visit (INDEPENDENT_AMBULATORY_CARE_PROVIDER_SITE_OTHER): Payer: Medicare Other | Admitting: Neurology

## 2019-06-25 VITALS — BP 121/78 | HR 73 | Ht 67.0 in | Wt 218.1 lb

## 2019-06-25 DIAGNOSIS — E782 Mixed hyperlipidemia: Secondary | ICD-10-CM | POA: Diagnosis not present

## 2019-06-25 DIAGNOSIS — I10 Essential (primary) hypertension: Secondary | ICD-10-CM

## 2019-06-25 DIAGNOSIS — E119 Type 2 diabetes mellitus without complications: Secondary | ICD-10-CM

## 2019-06-25 DIAGNOSIS — G459 Transient cerebral ischemic attack, unspecified: Secondary | ICD-10-CM | POA: Diagnosis not present

## 2019-06-25 DIAGNOSIS — R55 Syncope and collapse: Secondary | ICD-10-CM | POA: Diagnosis not present

## 2019-06-25 DIAGNOSIS — G43709 Chronic migraine without aura, not intractable, without status migrainosus: Secondary | ICD-10-CM | POA: Diagnosis not present

## 2019-06-25 MED ORDER — NORTRIPTYLINE HCL 10 MG PO CAPS: 10.0000 mg | ORAL_CAPSULE | Freq: Every day | ORAL | 3 refills | Status: DC

## 2019-06-25 NOTE — Patient Instructions (Addendum)
1.  For headache prevention, start nortriptyline 10mg  at bedtime.  If headaches not improved in 4 weeks, contact me and we can increase dose. 2.  Limit use of Tylenol and all pain relievers to no more than 2 days out of week to prevent risk of rebound or medication-overuse headache. 3.  We will order MRI of brain without contrast 4.  We will order EEG 5.  As per [facility], no driving for 6 months after episode of loss of consciousness.  Therefore, if you have no further spells, no driving until December 25. 6.  Follow up in 4 months.  We have sent a referral to [facility] for your MRI and they will call you directly to schedule your appointment. They are located at [place]. If you need to contact them directly, or you have not received a call to schedule an appointment with 5-7 days,  please call [phone].

## 2019-06-30 ENCOUNTER — Telehealth: Payer: Self-pay | Admitting: Neurology

## 2019-06-30 ENCOUNTER — Other Ambulatory Visit: Payer: Self-pay | Admitting: Neurology

## 2019-06-30 MED ORDER — DIAZEPAM 5 MG PO TABS: ORAL_TABLET | ORAL | 0 refills | Status: DC

## 2019-06-30 NOTE — Telephone Encounter (Signed)
Patient left msg with after hours about having several questions regarding testing and would like to speak with the nurse. Thanks!

## 2019-06-30 NOTE — Telephone Encounter (Signed)
Called and discussed upcoming EEG and MRI with Pt.  She is anxious about the MRI, she is claustrophobic and is requesting medication for the procedure. Pt states her husband will be driving her to the appointment. Confirmed we have the correct pharmacy.

## 2019-06-30 NOTE — Telephone Encounter (Signed)
Valium 5mg  sent in

## 2019-07-05 ENCOUNTER — Ambulatory Visit (INDEPENDENT_AMBULATORY_CARE_PROVIDER_SITE_OTHER): Payer: Medicare Other | Admitting: Neurology

## 2019-07-05 ENCOUNTER — Other Ambulatory Visit: Payer: Self-pay

## 2019-07-05 DIAGNOSIS — R55 Syncope and collapse: Secondary | ICD-10-CM | POA: Diagnosis not present

## 2019-07-06 NOTE — Procedures (Signed)
ELECTROENCEPHALOGRAM REPORT  Date of Study: 07/05/2019  Patient's Name: Destiny Savage MRN: [id number] Date of Birth: [birth year]  Clinical History: 68 year old female with transient loss of awareness.    Medications: PROAIR HFA 108 (90 Base) MCG/ACT inhaler aspirin 81 MG tablet COREG 6.25 MG tablet PLAVIX 75 MG tablet LASIX 20 MG tablet GLUCOPHAGE 500 MG tablet PRILOSEC 20 MG capsule ZOFRAN-ODT 4 MG K-DUR,KLOR-CON 10 MEQ tablet ZOCOR 40 MG tablet  Technical Summary: A multichannel digital EEG recording measured by the international 10-20 system with electrodes applied with paste and impedances below 5000 ohms performed in our laboratory with EKG monitoring in an awake and asleep patient.  Hyperventilation was not performed as patient is wearing mask due to COVID.  Photic stimulation was performed.  The digital EEG was referentially recorded, reformatted, and digitally filtered in a variety of bipolar and referential montages for optimal display.    Description: The patient is awake and asleep during the recording.  During maximal wakefulness, there is a symmetric, medium voltage 11 Hz posterior dominant rhythm that attenuates with eye opening.  The record is symmetric.  During drowsiness and sleep, there is an increase in theta slowing of the background.  Vertex waves and symmetric sleep spindles were seen.  Photic stimulation did not elicit any abnormalities.  There were no epileptiform discharges or electrographic seizures seen.    EKG lead was unremarkable.  Impression: This awake and asleep EEG is normal.    Clinical Correlation: A normal EEG does not exclude a clinical diagnosis of epilepsy.  If further clinical questions remain, prolonged EEG may be helpful.  Clinical correlation is advised.   Metta Clines, DO

## 2019-07-09 ENCOUNTER — Telehealth: Payer: Self-pay

## 2019-07-09 NOTE — Telephone Encounter (Signed)
-----   Message from Pieter Partridge, DO sent at 07/06/2019 11:10 AM EDT ----- EEG is normal

## 2019-07-09 NOTE — Telephone Encounter (Signed)
Patient advised of EEG  

## 2019-07-13 ENCOUNTER — Telehealth: Payer: Self-pay | Admitting: Cardiovascular Disease

## 2019-07-13 NOTE — Telephone Encounter (Signed)
   Primary Cardiologist: Ida Rogue, MD  Chart reviewed as part of pre-operative protocol coverage. Patient was contacted 07/13/2019 in reference to pre-operative risk assessment for pending surgery as outlined below.  FALLYN MUNNERLYN was last seen on 05/18/19 by Christell Faith PAC.  Since that day, SHELA ESSES has done well.  She can complete more than 4.0 METS without anginal pain. She does not have a history of CAD.  Her DAPT with ASA/plavix was recommended and managed by her neurologist, started during her Feb 2020 hospitalization. Pleaser reach out to her neurologist for guidance on holding plavix.   Therefore, based on ACC/AHA guidelines, the patient would be at acceptable risk for the planned procedure without further cardiovascular testing.   I will route this recommendation to the requesting party via Epic fax function and remove from pre-op pool.  Please call with questions.  Ledora Bottcher, PA 07/13/2019, 3:37 PM

## 2019-07-13 NOTE — Telephone Encounter (Signed)
   [facility] Pre-operative Risk Assessment    Request for surgical clearance:  1. What type of surgery is being performed? EXCISION MASS LEFT FINGER  2. When is this surgery scheduled? 08/06/19  What type of clearance is required (medical clearance vs. Pharmacy clearance to hold med vs. Both)? BOTH  3. Are there any medications that need to be held prior to surgery and how long? PLAVIX  Practice name and name of physician performing surgery? [facility] What is your office phone number [phone]   7.   What is your office fax number [phone]  8.   Anesthesia type (None, local, MAC, general) ? MAC  Lucienne Minks 07/13/2019, 12:50 PM  _________________________________________________________________   (provider comments below)

## 2019-07-21 ENCOUNTER — Telehealth: Payer: Self-pay | Admitting: *Deleted

## 2019-07-21 NOTE — Telephone Encounter (Signed)
Our office received a preop clearance form from [facility] for this patient asking Dr. Tomi Likens when patient should discontinue her plavix/aspirin. Patient has MRI scheduled 9/9 and MD wants to wait until after this MRI is done to fully complete form.  Call placed to number on [facility] pre op clearance form [phone] with message left to call me back and I will relay the above information.

## 2019-07-27 ENCOUNTER — Telehealth: Payer: Self-pay | Admitting: Neurology

## 2019-07-27 NOTE — Telephone Encounter (Signed)
Elizabeth from [employer] called requesting an update on the patient's surgical clearance progress with regards to anticoagulant.  Once complete, please fax notes/clearance to: [phone]

## 2019-07-28 ENCOUNTER — Other Ambulatory Visit: Payer: Self-pay

## 2019-07-28 ENCOUNTER — Ambulatory Visit
Admission: RE | Admit: 2019-07-28 | Discharge: 2019-07-28 | Disposition: A | Discharge status: Home / Self Care | Payer: Medicare Other | Source: Ambulatory Visit | Attending: Neurology | Admitting: Neurology

## 2019-07-28 DIAGNOSIS — R55 Syncope and collapse: Secondary | ICD-10-CM

## 2019-07-28 NOTE — Telephone Encounter (Signed)
~  Manuela Schwartz  I seen your telephone note from 07/21/19 regarding this Did Dr. Tomi Likens approved her for surgery did he sign pre-op form?

## 2019-07-28 NOTE — Telephone Encounter (Signed)
Spoke to Theodora Blow at [facility] and told her patient is having MRI tonight at 6pm. MD  Has made recommendations for stopping plavix/aspirin and after MD reviews MRI will fax pre op clearance form over to [facility] at [phone]. Benjamine Mola made aware of above.

## 2019-07-30 ENCOUNTER — Telehealth: Payer: Self-pay

## 2019-07-30 ENCOUNTER — Telehealth: Payer: Self-pay | Admitting: Neurology

## 2019-07-30 NOTE — Telephone Encounter (Signed)
Pt is wanting to get the MRI results please call

## 2019-07-30 NOTE — Telephone Encounter (Signed)
Spoke to [employer] at [facility] and sent over fax pre op clearance for patient (received fax confirmation). Also attached office notes and recent MRI. Elizabeth aware that their office ([facility]) is to call patient when she is to stop her plavix/aspirin per Dr. Georgie Chard recommendations. Verbalized understanding.

## 2019-07-30 NOTE — Telephone Encounter (Signed)
No answer at 350 

## 2019-07-30 NOTE — Telephone Encounter (Signed)
-----   Message from Pieter Partridge, DO sent at 07/30/2019  7:11 AM EDT ----- MRI of brain looks okay.  I do not see anything concerning.

## 2019-08-03 NOTE — Telephone Encounter (Signed)
Left message on voicemail normal

## 2019-08-06 ENCOUNTER — Other Ambulatory Visit: Payer: Self-pay | Admitting: Cardiovascular Disease

## 2019-09-06 ENCOUNTER — Other Ambulatory Visit: Payer: Self-pay

## 2019-09-06 MED ORDER — FUROSEMIDE 20 MG PO TABS: 20.0000 mg | ORAL_TABLET | Freq: Every day | ORAL | 0 refills | Status: DC

## 2019-09-06 NOTE — Telephone Encounter (Signed)
*  STAT* If patient is at the pharmacy, call can be transferred to refill team.   1. Which medications need to be refilled? (please list name of each medication and dose if known) Lasix  2. Which pharmacy/location (including street and city if local pharmacy) is medication to be sent to? [facility] [address]  3. Do they need a 30 day or 90 day supply? 30   

## 2019-09-08 ENCOUNTER — Other Ambulatory Visit: Payer: Self-pay | Admitting: Cardiovascular Disease

## 2019-09-21 ENCOUNTER — Other Ambulatory Visit: Payer: Self-pay | Admitting: Cardiovascular Disease

## 2019-09-21 MED ORDER — FUROSEMIDE 20 MG PO TABS: 20.0000 mg | ORAL_TABLET | Freq: Every day | ORAL | 0 refills | Status: DC

## 2019-09-21 NOTE — Telephone Encounter (Signed)
°*  STAT* If patient is at the pharmacy, call can be transferred to refill team.   1. Which medications need to be refilled? (please list name of each medication and dose if known) furosemide (LASIX) 20 MG 1 tablet daily   2. Which pharmacy/location (including street and city if local pharmacy) is medication to be sent to? [facility] on [address]   3. Do they need a 30 day or 90 day supply?   Patient calling, scheduled a follow up with R Dunn on 11/16 - would like to know if medication can be sent in before appt.  Patient is out of medication.  Please review and advise.
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
## 2019-10-04 ENCOUNTER — Ambulatory Visit (INDEPENDENT_AMBULATORY_CARE_PROVIDER_SITE_OTHER): Payer: Medicare Other | Admitting: Physician Assistant

## 2019-10-04 ENCOUNTER — Other Ambulatory Visit: Payer: Self-pay

## 2019-10-04 ENCOUNTER — Encounter: Payer: Self-pay | Admitting: Physician Assistant

## 2019-10-04 VITALS — BP 120/70 | HR 71 | Temp 97.1°F | Ht 66.0 in | Wt 219.5 lb

## 2019-10-04 DIAGNOSIS — R079 Chest pain, unspecified: Secondary | ICD-10-CM | POA: Diagnosis not present

## 2019-10-04 DIAGNOSIS — G4730 Sleep apnea, unspecified: Secondary | ICD-10-CM

## 2019-10-04 DIAGNOSIS — E785 Hyperlipidemia, unspecified: Secondary | ICD-10-CM | POA: Diagnosis not present

## 2019-10-04 DIAGNOSIS — I10 Essential (primary) hypertension: Secondary | ICD-10-CM

## 2019-10-04 DIAGNOSIS — G459 Transient cerebral ischemic attack, unspecified: Secondary | ICD-10-CM | POA: Diagnosis not present

## 2019-10-04 DIAGNOSIS — H539 Unspecified visual disturbance: Secondary | ICD-10-CM

## 2019-10-04 DIAGNOSIS — R400 Somnolence: Secondary | ICD-10-CM

## 2019-10-04 DIAGNOSIS — I5189 Other ill-defined heart diseases: Secondary | ICD-10-CM

## 2019-10-04 NOTE — Patient Instructions (Signed)
Medication Instructions:   1. Your physician recommends that you continue on your current medications as directed. Please refer to the Current Medication list given to you today.  *If you need a refill on your cardiac medications before your next appointment, please call your pharmacy*  Lab Work:  1. Your physician recommends that you have lab work today(Lipid, Liver, CBC)  If you have labs (blood work) drawn today and your tests are completely normal, you will receive your results only by: Marland Kitchen MyChart Message (if you have MyChart) OR . A paper copy in the mail If you have any lab test that is abnormal or we need to change your treatment, we will call you to review the results.  Testing/Procedures:  1. None Ordered.  Follow-Up: At [facility], you and your health needs are our priority.  As part of our continuing mission to provide you with exceptional heart care, we have created designated Provider Care Teams.  These Care Teams include your primary Cardiologist (physician) and Advanced Practice Providers (APPs -  Physician Assistants and Nurse Practitioners) who all work together to provide you with the care you need, when you need it.  Your next appointment:   6 months  The format for your next appointment:   In Person  Provider:   Ida Rogue, MD

## 2019-10-05 ENCOUNTER — Telehealth: Payer: Self-pay | Admitting: Physician Assistant

## 2019-10-05 ENCOUNTER — Other Ambulatory Visit: Payer: Self-pay | Admitting: Cardiovascular Disease

## 2019-10-05 ENCOUNTER — Telehealth: Payer: Self-pay

## 2019-10-05 DIAGNOSIS — E782 Mixed hyperlipidemia: Secondary | ICD-10-CM

## 2019-10-05 LAB — CBC
Hematocrit: 35.2 % (ref 34.0–46.6)
Hemoglobin: 11.8 g/dL (ref 11.1–15.9)
MCH: 29.4 pg (ref 26.6–33.0)
MCHC: 33.5 g/dL (ref 31.5–35.7)
MCV: 88 fL (ref 79–97)
Platelets: 274 10*3/uL (ref 150–450)
RBC: 4.02 x10E6/uL (ref 3.77–5.28)
RDW: 12.1 % (ref 11.7–15.4)
WBC: 8.6 10*3/uL (ref 3.4–10.8)

## 2019-10-05 LAB — HEPATIC FUNCTION PANEL
ALT: 9 IU/L (ref 0–32)
AST: 16 IU/L (ref 0–40)
Albumin: 4.3 g/dL (ref 3.8–4.8)
Alkaline Phosphatase: 104 IU/L (ref 39–117)
Bilirubin Total: <0.2 mg/dL (ref 0.0–1.2)
Bilirubin, Direct: 0.09 mg/dL (ref 0.00–0.40)
Total Protein: 6.8 g/dL (ref 6.0–8.5)

## 2019-10-05 LAB — LIPID PANEL
Chol/HDL Ratio: 2.5 ratio (ref 0.0–4.4)
Cholesterol, Total: 152 mg/dL (ref 100–199)
HDL: 60 mg/dL (ref >39)
LDL Chol Calc (NIH): 76 mg/dL (ref 0–99)
Triglycerides: 86 mg/dL (ref 0–149)
VLDL Cholesterol Cal: 16 mg/dL (ref 5–40)

## 2019-10-05 MED ORDER — FUROSEMIDE 20 MG PO TABS: 20.0000 mg | ORAL_TABLET | Freq: Every day | ORAL | 1 refills | Status: DC

## 2019-10-05 MED ORDER — ATORVASTATIN CALCIUM 40 MG PO TABS: 40.0000 mg | ORAL_TABLET | Freq: Every day | ORAL | 3 refills | Status: DC

## 2019-10-05 NOTE — Telephone Encounter (Signed)
Call to patient to make her aware of message from Christell Faith, [place].   No further questions at this time.   Advised pt to call for any further questions or concerns.

## 2019-10-05 NOTE — Telephone Encounter (Signed)
Patient returning call.  Call husband if no answer [phone] per patient

## 2019-10-05 NOTE — Telephone Encounter (Signed)
-----   Message from Rise Mu, PA-C sent at 10/05/2019  7:13 AM EST ----- LDL 76. Liver function normal. Blood count normal. Recommend discontinuing simvastatin and starting Lipitor 40 mg daily. Recheck fasting lipid panel and liver function in 3 months.

## 2019-10-05 NOTE — Telephone Encounter (Signed)
Requested Prescriptions   Signed Prescriptions Disp Refills  . furosemide (LASIX) 20 MG tablet 90 tablet 1    Sig: Take 1 tablet (20 mg total) by mouth daily.    Authorizing Provider: Rise Mu    Ordering User: Raelene Bott, BRANDY L

## 2019-10-05 NOTE — Telephone Encounter (Signed)
Call to patient to make her aware of results and POC from Christell Faith, [place].   Orders placed and pt agreeable to POC.   Advised pt to call for any further questions or concerns.

## 2019-10-05 NOTE — Telephone Encounter (Signed)
Attempted to call patient. LMTCB 10/05/2019
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
## 2019-10-05 NOTE — Telephone Encounter (Signed)
Patient refill for Lipitor is not ready please advise if ok to take simvastatin tonight.    Per Iva ok .  Patient aware .

## 2019-10-05 NOTE — Telephone Encounter (Signed)
°*  STAT* If patient is at the pharmacy, call can be transferred to refill team.   1. Which medications need to be refilled? (please list name of each medication and dose if known)   Furosemide  20 mg po q d   2. Which pharmacy/location (including street and city if local pharmacy) is medication to be sent to?  [facility] [address]   3. Do they need a 30 day or 90 day supply?  [place]
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
## 2019-10-27 ENCOUNTER — Encounter: Payer: Self-pay | Admitting: Neurology

## 2019-10-27 ENCOUNTER — Other Ambulatory Visit: Payer: Self-pay

## 2019-10-27 ENCOUNTER — Telehealth (INDEPENDENT_AMBULATORY_CARE_PROVIDER_SITE_OTHER): Payer: Medicare Other | Admitting: Neurology

## 2019-10-27 VITALS — Ht 65.0 in | Wt 216.0 lb

## 2019-10-27 DIAGNOSIS — I10 Essential (primary) hypertension: Secondary | ICD-10-CM | POA: Diagnosis not present

## 2019-10-27 DIAGNOSIS — G43009 Migraine without aura, not intractable, without status migrainosus: Secondary | ICD-10-CM | POA: Diagnosis not present

## 2019-10-27 DIAGNOSIS — H543 Unqualified visual loss, both eyes: Secondary | ICD-10-CM | POA: Diagnosis not present

## 2019-10-27 DIAGNOSIS — H532 Diplopia: Secondary | ICD-10-CM | POA: Diagnosis not present

## 2019-10-27 DIAGNOSIS — E119 Type 2 diabetes mellitus without complications: Secondary | ICD-10-CM

## 2019-10-27 NOTE — Addendum Note (Signed)
Addended by: Ranae Plumber on: 10/27/2019 11:21 AM   Modules accepted: Orders

## 2019-11-01 ENCOUNTER — Other Ambulatory Visit: Payer: Self-pay | Admitting: Internal Medicine

## 2019-11-01 DIAGNOSIS — Z1231 Encounter for screening mammogram for malignant neoplasm of breast: Secondary | ICD-10-CM

## 2019-11-04 ENCOUNTER — Other Ambulatory Visit: Payer: Self-pay

## 2019-11-04 ENCOUNTER — Ambulatory Visit
Admission: RE | Admit: 2019-11-04 | Discharge: 2019-11-04 | Disposition: A | Discharge status: Home / Self Care | Payer: Medicare Other | Source: Ambulatory Visit | Attending: Neurology | Admitting: Neurology

## 2019-11-04 DIAGNOSIS — H543 Unqualified visual loss, both eyes: Secondary | ICD-10-CM

## 2019-11-04 DIAGNOSIS — H532 Diplopia: Secondary | ICD-10-CM

## 2019-11-04 DIAGNOSIS — G43009 Migraine without aura, not intractable, without status migrainosus: Secondary | ICD-10-CM

## 2019-11-04 MED ORDER — IOPAMIDOL (ISOVUE-370) INJECTION 76%
75.0000 mL | Freq: Once | INTRAVENOUS | Status: AC | PRN
  Administered 2019-11-04: 75 mL via INTRAVENOUS

## 2019-12-09 ENCOUNTER — Ambulatory Visit
Admission: RE | Admit: 2019-12-09 | Discharge: 2019-12-09 | Disposition: A | Discharge status: Home / Self Care | Payer: Medicare Other | Source: Ambulatory Visit | Attending: Internal Medicine | Admitting: Internal Medicine

## 2019-12-09 DIAGNOSIS — Z1231 Encounter for screening mammogram for malignant neoplasm of breast: Secondary | ICD-10-CM | POA: Diagnosis present

## 2019-12-29 ENCOUNTER — Telehealth: Payer: Self-pay | Admitting: Neurology

## 2019-12-29 NOTE — Telephone Encounter (Signed)
Patient called in regarding her blacking out she had before and forgetting things like her Cell number. She's having a hard time remembering things and very concerned. She has had an MRI in 10/2019. She would like to speak with someone. Please Call. Thank you

## 2019-12-30 NOTE — Telephone Encounter (Signed)
Left message reaching out to patient regarding her concerns.

## 2019-12-30 NOTE — Telephone Encounter (Signed)
Patient returned call to CMA, [facility].
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
## 2020-01-03 ENCOUNTER — Ambulatory Visit (INDEPENDENT_AMBULATORY_CARE_PROVIDER_SITE_OTHER): Payer: Medicare Other | Admitting: Neurology

## 2020-01-03 ENCOUNTER — Encounter: Payer: Self-pay | Admitting: Neurology

## 2020-01-03 ENCOUNTER — Other Ambulatory Visit: Payer: Self-pay

## 2020-01-03 VITALS — BP 120/70 | Resp 18 | Ht 66.0 in | Wt 221.0 lb

## 2020-01-03 DIAGNOSIS — Z9989 Dependence on other enabling machines and devices: Secondary | ICD-10-CM

## 2020-01-03 DIAGNOSIS — E119 Type 2 diabetes mellitus without complications: Secondary | ICD-10-CM

## 2020-01-03 DIAGNOSIS — H543 Unqualified visual loss, both eyes: Secondary | ICD-10-CM

## 2020-01-03 DIAGNOSIS — G4733 Obstructive sleep apnea (adult) (pediatric): Secondary | ICD-10-CM

## 2020-01-03 DIAGNOSIS — I10 Essential (primary) hypertension: Secondary | ICD-10-CM

## 2020-01-03 DIAGNOSIS — G459 Transient cerebral ischemic attack, unspecified: Secondary | ICD-10-CM

## 2020-01-03 DIAGNOSIS — G3184 Mild cognitive impairment, so stated: Secondary | ICD-10-CM

## 2020-01-03 DIAGNOSIS — G43709 Chronic migraine without aura, not intractable, without status migrainosus: Secondary | ICD-10-CM

## 2020-01-03 DIAGNOSIS — E782 Mixed hyperlipidemia: Secondary | ICD-10-CM

## 2020-01-03 NOTE — Patient Instructions (Addendum)
1.  I will order neurocognitive testing to assess memory 2.  I will refer you to sleep medicine at [facility] 3.  Continue nortriptyline 10mg  at bedtime for headache prevention 4.  Follow up in 4 months

## 2020-01-24 ENCOUNTER — Ambulatory Visit: Payer: Medicare Other

## 2020-01-24 ENCOUNTER — Encounter: Payer: Self-pay | Admitting: Counselor

## 2020-01-24 ENCOUNTER — Other Ambulatory Visit: Payer: Self-pay

## 2020-01-24 ENCOUNTER — Ambulatory Visit (INDEPENDENT_AMBULATORY_CARE_PROVIDER_SITE_OTHER): Payer: Medicare Other | Admitting: Counselor

## 2020-01-24 DIAGNOSIS — G3184 Mild cognitive impairment, so stated: Secondary | ICD-10-CM

## 2020-01-24 DIAGNOSIS — G459 Transient cerebral ischemic attack, unspecified: Secondary | ICD-10-CM | POA: Diagnosis not present

## 2020-01-24 NOTE — Progress Notes (Signed)
   Psychometrist Note   Cognitive testing was administered to Destiny Savage by Lamar Benes, B.S. (Technician) (psychometrist) under the supervision of Alphonzo Severance, Psy.D., ABN. Destiny Savage was able to tolerate all test procedures. Dr. Nicole Kindred met with the patient as needed to manage any emotional reactions to the testing procedures (if applicable). Rest breaks were offered.    The battery of tests administered was selected by Dr. Nicole Kindred with consideration to the patient's current level of functioning, the nature of her symptoms, emotional and behavioral responses during the interview, level of literacy, observed level of motivation/effort, and the nature of the referral question. This battery was communicated to the psychometrist. Communication between Dr. Nicole Kindred and the psychometrist was ongoing throughout the evaluation and Dr. Nicole Kindred was immediately accessible at all times. Dr. Nicole Kindred provided supervision to the technician on the date of this service, to the extent necessary to assure the quality of all services provided.    Destiny Savage will return in approximately one week for an interactive feedback session with Dr. Nicole Kindred, at which time female test performance, clinical impressions, and treatment recommendations will be reviewed in detail. The patient understands she can contact our office should she require our assistance before this time.   A total of 120 minutes of billable time were spent with Destiny Savage by the technician, including test administration and scoring time. Billing for these services is reflected in Dr. Les Pou note.   This note reflects time spent with the psychometrician and does not include test scores, clinical history, or any interpretations made by Dr. Nicole Kindred. The full report will follow in a separate note.
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
## 2020-01-27 ENCOUNTER — Encounter: Payer: Self-pay | Admitting: Counselor

## 2020-01-27 NOTE — Progress Notes (Signed)
[facility]  Patient Name: Destiny Savage MRN: [id number] Date of Birth: 1951-01-26 Age: 69 y.o. Education: 14 years  Measurement properties of test scores: IQ, Index, and Standard Scores (SS): Mean = 100; Standard Deviation = 15 Scaled Scores (Ss): Mean = 10; Standard Deviation = 3 Z scores (Z): Mean = 0; Standard Deviation = 1 T scores (T); Mean = 50; Standard Deviation = 10  TEST SCORES:    Note: This summary of test scores accompanies the interpretive report and should not be considered in isolation without reference to the appropriate sections in the text. Test scores are relative to age, gender, and educational history as available and appropriate.   Performance Validity        A Random Letter Test Raw Descriptor      Total Errors 0 Within Expectation          The Dot Counting Test: Raw Descriptor      E-Score 12 Within Expectation      Embedded Measures: Raw Descriptor      RBANS Effort Index: 2 Within Expectation      WAIS-IV Reliable Digit Span: 12 Within Expectation      WAIS-IV Reliable Digit Span Revised 16 Within Expectation      Expected Functioning        Wide Range Achievement Test (Word Reading): Standard/Scaled Score Percentile       Word Reading 99 47      Reynolds Intellectual Screening Test Standard/T-score Percentile      Guess What 43 25      Odd Item Out 39 14  RIST Index 88 22      Cognitive Testing        RBANS, Form : Standard/Scaled Score Percentile  Total Score 82 12  Immediate Memory 78 7      List Learning 6 9      Story Memory 6 9  Visuospatial/Constructional 105 63      Figure Copy 10 50      Line Orientation --- 51-75  Language 87 19      Picture Naming --- 17-25      Semantic Fluency 5 5  Attention 106 66      Digit Span 12 75      Coding 10 50  Delayed Memory 56 <1      List Recall --- <2      List Recognition --- <2      Story Recall 3 1      Figure Recall 6 9      Wechsler  Adult Intelligence Scale - IV: Standard/Scaled Score Percentile  Working Memory Index 92 30      Digit Span 10 50          Digit Span Forward 13 84          Digit Span Backward 10 50          Digit Span Sequencing 7 16      Arithmetic 7 16  Processing Speed Index 89 23      Symbol Search 7 16      Coding 9 37      Verbal Fluency: T-score Percentile      Controlled Oral Word Association (F-A-S) 51 54      Semantic Fluency (Animals) 55 69      Trail Making Test: T-Score Percentile      Part A 48 42      Part B 62  88      Modified [employer] Test (MWCST): Standard/T-Score Percentile      Number of Categories Correct 27 2      Number of Perseverative Errors 29 2      Number of Total Errors 29 2      Percent Perseverative Errors 44 28  Executive Function Composite 64 1      [place] Diagnostic Aphasia Exam: Raw Score Scaled Score      Complex Ideational Material 10 7      Rating Scales        Patient Health Questionnaire - 9 10 Moderate Depression  GAD-7 7 Mild Anxiety    Peter V. Roseanne Reno PsyD, ABN Clinical Neuropsychologist

## 2020-01-31 NOTE — Progress Notes (Signed)
NEUROPSYCHOLOGICAL EVALUATION [facility]  Patient Name: Destiny Savage MRN: [id number] Date of Birth: [birth year] Age: 69 y.o. Education: 14 years  Clinical Impressions  Destiny Savage is a 69 y.o., left-hand dominant, married woman with a history of headaches, OSA, and two episodes of stroke like symptoms (01/07/19 and 05/13/2019) that were attributed to TIAs with no definitive cause determined on workup. On interview, she described mild day-to-day cognitive problems including misplacing things, being more dependent on a calendar, and forgetting what she is doing in the middle of a task. She is still functioning fairly well and denied being unable to do anything she did previously as a result of her cognitive issues. She does report significantly diminished motivation, poor sleep (4 hours a night) and symptoms suggestive of depressive illness. Her MRI of the brain showed reasonable brain volume with mild volume loss and some minimal white matter ischemic changes.   On neuropsychological testing, Destiny Savage demonstrated low average overall ability and commensurate neurocognitive test performance. Nevertheless, she did demonstrate a very consistent pattern of less than expected performance on memory measures and also had a hard time on one indicator of executive function. The profile shows weak encoding of information and significant difficulties with retention of information across time, which may be portentous. She reported moderate levels of depressive symptoms and mild levels of anxiety symptoms.   Destiny Savage is thus demonstrating a mild level of cognitive impairment with relatively isolated memory problems. These findings suggest that she may be at risk for further decline and she should be followed over time. Etiologically, consideration is given to depression and anxiety, distraction related to pain from headaches, and sleep problems as potential contributors but the possibility of an  underlying condition cannot be entire ruled out.    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 You did, however, demonstrate a consistent pattern of low performance on measures of memory. You are still functioning well day-to-day and therefore, the best diagnosis at this point in time is mild cognitive impairment.   The major difference between mild cognitive impairment (MCI) and dementia is in severity and potential prognosis. Once someone reaches a level of severity adequate to be diagnosed with a dementia, there is usually progression over time, though this may be years. On the other hand, mild cognitive impairment, while a significant risk for dementia in future, does not always progress to dementia, and in some instances stays the same or can even revert to normal. It is important to realize that if MCI is due to underlying Alzheimer's disease, it will most likely progress to dementia eventually. The rate of conversion to Alzheimer's dementia from amnestic MCI is about 15% per year versus the general population risk of conversion of 2% per year.   In your case, it is too early to tell what exactly is causing your cognitive impairment, although there are many reversible causes that would be a reasonable place to start in terms of treatment. I am specifically worried about the levels of depression and anxiety that you reported, which can distraction related to pain from your headaches, insufficient sleep, and possibly also medication side effects from Nortryptaline.    For depression, medications and psychotherapy are both effective treatments. You could discuss starting an antidepressant medication with your primary care provider. If you are interested in psychotherapy,  I can also offer  you a referral. There are specific types of psychotherapy (I.e., CBT-I or "Cognitive Behavioral Therapy for Insomnia") that could also help you get a better nights sleep.  For sleep, I recommend against using medications, which can have lingering sedating effects on the brain and rob your brain of restful REM sleep. Instead, consider trying some of the following sleep hygiene recommendations. They may not work at once and may take effort, but the effort you spend is likely to be rewarded with better sleep eventually:  . Stick to a sleep schedule of the same bedtime and wake time even on the weekends, which can help to regulate your body's internal clock so that you fall asleep and stay asleep.  . Practice a relaxing bedtime ritual (conducted away from bright lights) which will help separate your sleep from stimulating activities and prepare your body to fall asleep when you go to bed.  . Avoid naps, especially in the afternoon.  . Evaluate your room and create conditions that will promote sleep such as keeping it cool (between 60 - 67 degrees), quiet, and free from any lights. Consider using blackout curtains, a "white noise" generator, or fan that will help mask any noises that might prevent you from going to sleep or awaken you during the night.  . Sleep on a comfortable mattress and pillows.  . Avoid bright light in the evening and excessive use of portable electronic devices right before bed that may contain light frequencies that can contribute to sleep problems.  . Avoid alcohol, cigarettes, or heavy meals in the evening. If you must eat, consume a light snack 45 minutes before bed.  . Use your bed only for sleep to strengthen the association between your bed and sleep.  . If you can't go to sleep within 30 minutes, go into another room and do something relaxing until you feel tired. Then, come back and try to go to sleep again for 30 minutes and repeat until sleep is achieved.  . Some people find  over the counter melatonin to be helpful for sleep, which you could discuss with a pharmacist or prescribing provider.   For headaches, you are already seeing Dr. Tomi Likens and I would refer to his expertise in that regard. The following behavioral strategies may also be of benefit: Make sure you are getting sufficient amounts of sleep (around 8 hours per day). Drink plenty of fluids (64 ounces of clear fluids daily), especially water. Stay away from soda or fruit juices with high amounts of sugar. Get regular exercise, one hour every other day or one half hour every day. Cardiovascular exercise, in particular, may be good for brain health. Make sure to limit your daily intake of caffeine to no more than 200mg  per day (there is about 95mg  of caffeine in an 8oz cup of coffee). While over the counter medications can be helpful, all over the counter analgesics have the potential to cause medication overuse headaches. Avoid anything with caffeine, narcotics, or barbiturates because these can cause rebound headaches with very few doses. Taking over the counter analgesics more than 2 or 3 days per week can cause medication overuse headaches and interfere with preventative medications for an underlying primary headache disorder.   Return for reevaluation in one to two years so that we can follow your progress over time.   As a general recommendation, I would suggest that you engage in health lifestyle behaviors including getting recommended (usually 8 hours) amounts of sleep,  eating a healthy diet rich in unprocessed foods such as vegetables and whole grains, assertively monitor and manage underlying medical conditions that can contribute to cognitive impairment (e.g., hypertension, high cholesterol, etc.), getting regular exercise (preferably 20-30 minutes a day), and engaging in satisfying social interactions with others.    Test Findings  Test scores are summarized in additional documentation associated with this  encounter. Test scores are relative to age, gender, and educational history as available and appropriate. There were no concerns about performance validity as all findings fell within normal expectations.   General Intellectual Functioning/Achievement:  Ms. Southern performed at an average level on single word reading and at a low average level on the [employer] Screening Test, therefore average to low average present as reasonable standards of comparison for her cognitive test performance.   Attention and Processing Efficiency: Performance on indicators of attention and processing efficiency was within normal limits and generally fell in the average to low average ranges. Digit span forward was average on two different indicators. Digit span backward was average and digit resequencing in ascending order was low average. Mental solving of arithmetical word problems was low average. Low average to average scores were demonstrated on indicators of processing speed, with low average performance overall on the Processing Speed Index of the WAIS-IV.   Language: Language findings were within gross limits of normal albeit with some minor variability. Visual object confrontation naming was low average. Generation of words was average in response to the letters F-A-S. Category fluency was unusually low for fruits and vegetables but average in response to the category prompt "animals."   Visuospatial Function: Performance on visuospatial and constructional measures fell at a reasonable average level with good average range scores on copy of a line drawing and on judgment of angular line orientations.   Learning and Memory: Performance on learning and memory measures was below expectations for Ms. Dayton Scrape, with a pattern showing encoding difficulties and poor retention of information across time. Difficulties with acquisition and retention of information were demonstrated in both the verbal and visual  modalities.   In the verbal realm, immediate recall of material including a 10-item word list and brief short story was unusually low followed by no words recalled on delayed recall of the word list and only 3 details of the story recalled. Recognition cuing did not benefit Ms. Craton's performance and her score on yes/no recognition for the word list was extremely low.   In the visual realm, delayed recall of a modestly complex geometric figure was unusually low.   Executive Functions: Performance was variable on executive measures with fairly good average to high average range scores on generation of words in response to the letters F-A-S, alternating sequencing of numbers and letters, and when answering a series of yes/no questions requiring reasoning with verbally presented information. By contrast, she scored at an extremely low level on the Executive Function Composite of the Modified [employer] with extremely low scores for both perseverative errors and categories completed.   Rating Scale(s): Ms. Mcsorley reported moderate levels of depressive symptomatology and mild levels of anxiety symptomatology on self-rating scales.   Bettye Boeck Roseanne Reno PsyD, ABN Clinical Neuropsychologist

## 2020-02-01 ENCOUNTER — Other Ambulatory Visit: Payer: Self-pay

## 2020-02-01 ENCOUNTER — Ambulatory Visit (INDEPENDENT_AMBULATORY_CARE_PROVIDER_SITE_OTHER): Payer: Medicare Other | Admitting: Counselor

## 2020-02-01 DIAGNOSIS — G3184 Mild cognitive impairment, so stated: Secondary | ICD-10-CM | POA: Diagnosis not present

## 2020-02-01 DIAGNOSIS — F418 Other specified anxiety disorders: Secondary | ICD-10-CM

## 2020-02-01 NOTE — Patient Instructions (Signed)
Your performance and presentation on assessment were consistent with normal performance for you overall, which is positive. You did, however, demonstrate a consistent pattern of low performance on measures of memory. You are still functioning well day-to-day and therefore, the best diagnosis at this point in time is mild cognitive impairment.   The major difference between mild cognitive impairment (MCI) and dementia is in severity and potential prognosis. Once someone reaches a level of severity adequate to be diagnosed with a dementia, there is usually progression over time, though this may be years. On the other hand, mild cognitive impairment, while a significant risk for dementia in future, does not always progress to dementia, and in some instances stays the same or can even revert to normal.It is important to realize that if MCI is due to underlying Alzheimer's disease, it will most likely progress to dementia eventually. The rate of conversion to Alzheimer's dementiafrom amnestic MCI is about 15% per year versus the general population risk of conversion of 2% per year.  In your case, it is too early to tell what exactly is causing your cognitive impairment, although there are many reversible causes that would be a reasonable place to start in terms of treatment. I am specifically worried about the levels of depression and anxiety that you reported, which can distraction related to pain from your headaches, insufficient sleep, and possibly also medication side effects from Nortryptaline.    For depression, medications and psychotherapy are both effective treatments. You could discuss starting an antidepressant medication with your primary care provider. If you are interested in psychotherapy, I can also offer you a referral. There are specific types of psychotherapy (I.e., CBT-I or "Cognitive Behavioral Therapy for Insomnia") that could also help you get a better nights sleep.  For sleep, I  recommend against using medications, which can have lingering sedating effects on the brain and rob your brain of restful REM sleep. Instead, consider trying some of the following sleep hygiene recommendations. They may not work at once and may take effort, but the effort you spend is likely to be rewarded with better sleep eventually:   Stick to a sleep schedule of the same bedtime and wake time even on the weekends, which can help to regulate your body's internal clock so that you fall asleep and stay asleep.   Practice a relaxing bedtime ritual (conducted away from bright lights) which will help separate your sleep from stimulating activities and prepare your body to fall asleep when you go to bed.   Avoid naps, especially in the afternoon.   Evaluate your room and create conditions that will promote sleep such as keeping it cool (between 60 - 67 degrees), quiet, and free from any lights. Consider using blackout curtains, a white noise generator, or fan that will help mask any noises that might prevent you from going to sleep or awaken you during the night.   Sleep on a comfortable mattress and pillows.   Avoid bright light in the evening and excessive use of portable electronic devices right before bed that may contain light frequencies that can contribute to sleep problems.   Avoid alcohol, cigarettes, or heavy meals in the evening. If you must eat, consume a light snack 45 minutes before bed.   Use your bed only for sleep to strengthen the association between your bed and sleep.   If you can't go to sleep within 30 minutes, go into another room and do something relaxing until you feel tired. Then, come  back and try to go to sleep again for 30 minutes and repeat until sleep is achieved.   Some people find over the counter melatonin to be helpful for sleep, which you could discuss with a pharmacist or prescribing provider.   For headaches, you are already seeing Dr. Tomi Likens and I would  refer to his expertise in that regard. The following behavioral strategies may also be of benefit: Make sure you are getting sufficient amounts of sleep (around 8 hours per day). Drink plenty of fluids (64 ounces of clear fluids daily), especially water. Stay away from soda or fruit juices with high amounts of sugar. Get regular exercise, one hour every other day or one half hour every day. Cardiovascular exercise, in particular, may be good for brain health. Make sure to limit your daily intake of caffeine to no more than 200mg  per day (there is about 95mg  of caffeine in an 8oz cup of coffee). While over the counter medications can be helpful, all over the counter analgesics have the potential to cause medication overuse headaches. Avoid anything with caffeine, narcotics, or barbiturates because these can cause rebound headaches with very few doses. Taking over the counter analgesics more than 2 or 3 days per week can cause medication overuse headaches and interfere with preventative medications for an underlying primary headache disorder.   Return for reevaluation in one to two years so that we can follow your progress over time.   As a general recommendation, I would suggest that you engage in health lifestyle behaviors including getting recommended (usually 8 hours) amounts of sleep, eating a healthy diet rich in unprocessed foods such as vegetables and whole grains, assertively monitor and manage underlying medical conditions that can contribute to cognitive impairment (e.g., hypertension, high cholesterol, etc.), getting regular exercise (preferably 20-30 minutes a day), and engaging in satisfying social interactions with others.

## 2020-02-01 NOTE — Progress Notes (Signed)
[facility]  I met with Marily Memos to review the findings resulting from her neuropsychological evaluation. Since the last appointment, things have been about the same and there are no major changes.Time was spent reviewing the impressions and recommendations that are detailed in the evaluation report. Interventions provided during this encounter included psychoeducation, behavior modification. We specifically discussed, at length, the importance of managing her cerebrovascular risk factors through diet, exercise, and lifestyle modifications in addition to medical treatments. If she did have a TIA, then she is at risk for a stroke. We also reviewed her test findings in detail and recommendation to return for reevaluation such as in 1 to 2 years. I took time to explain the findings and answer all the patient's questions. I encouraged Ms. Speedy to contact me should they have any further questions or if further follow up is desired.   Current Medications and Medical History   Current Outpatient Medications  Medication Sig Dispense Refill  . albuterol (PROAIR HFA) 108 (90 Base) MCG/ACT inhaler Inhale 1 puff into the lungs every 6 (six) hours as needed for wheezing.    Marland Kitchen aspirin 81 MG tablet Take 1 tablet (81 mg total) by mouth daily.    Marland Kitchen atorvastatin (LIPITOR) 40 MG tablet Take 1 tablet (40 mg total) by mouth daily at 6 PM. 90 tablet 3  . carvedilol (COREG) 6.25 MG tablet Take 6.25 mg by mouth 2 (two) times daily with a meal.    . clopidogrel (PLAVIX) 75 MG tablet Take 75 mg by mouth daily.    . diazepam (VALIUM) 5 MG tablet Take 1 tablet 30 minutes prior to MRI 1 tablet 0  . furosemide (LASIX) 20 MG tablet Take 1 tablet (20 mg total) by mouth daily. 90 tablet 1  . metFORMIN (GLUCOPHAGE) 500 MG tablet Take 1,000 mg by mouth daily.    . metFORMIN (GLUCOPHAGE-XR) 500 MG 24 hr tablet Take 1,000 mg by mouth daily.    . nortriptyline (PAMELOR) 10 MG capsule  Take 1 capsule (10 mg total) by mouth at bedtime. 30 capsule 3  . omeprazole (PRILOSEC) 20 MG capsule Take 20 mg by mouth daily.    . ondansetron (ZOFRAN-ODT) 4 MG disintegrating tablet Take 4 mg by mouth every 8 (eight) hours as needed for nausea.    . potassium chloride (K-DUR,KLOR-CON) 10 MEQ tablet Take 10 mEq by mouth 2 (two) times daily.    Marland Kitchen triamcinolone cream (KENALOG) 0.5 % Apply 1 application topically 2 (two) times daily.     No current facility-administered medications for this visit.    Patient Active Problem List   Diagnosis Date Noted  . 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 Stroke ([place]) 01/10/2017  . Mixed hyperlipidemia 11/21/2016  . Neck pain 11/21/2016  . Chest pain 12/23/2015  . GERD (gastroesophageal reflux disease) 12/23/2015  . Abdominal pain 04/30/2013  . Vertigo 05/11/2012  . Hypertension 05/11/2012  . Diabetes type 2, controlled ([place]) 05/11/2012  . Neck pain on right side 05/11/2012    Mental Status and Behavioral Observations  Ms. Jupiter was available at the scheduled time for this telephonic appointment. She was attended by her husband, Iona Beard, who also participated in the encounter. She was alert and generally oriented. Her self-reported mood was "fine" and her affect was mainly neutral as assessed by vocal quality. Thought process was logical, linear, and goal-oriented and she had no difficulties following the topics that were discussed during  the encounter. Thought content was appropriate to the topics discussed.   Plan  Feedback provided regarding the patient's neuropsychological evaluation. She and her husband (who is also diabetic) presented as motivated to enact positive health changes and very appreciative of the recommendations we discussed. NEELA ZECCA was encouraged to contact me if any questions arise or if further follow up is desired. I suggested she return to clinic in 1 to 2 years or  sooner as needed given clinical status.   Viviano Simas Nicole Kindred, PsyD, ABN Clinical Neuropsychologist  Service(s) Provided at This Encounter: 50 minutes [phone]; Conjoint therapy with patient present)

## 2020-02-15 ENCOUNTER — Telehealth: Payer: Self-pay | Admitting: Cardiovascular Disease

## 2020-02-15 NOTE — Telephone Encounter (Signed)
   Primary Cardiologist: Julien Nordmann, MD  Chart reviewed as part of pre-operative protocol coverage. Given past medical history and time since last visit, based on ACC/AHA guidelines, Destiny Savage would be at acceptable risk for the planned procedure without further cardiovascular testing. Very low cardiac risk with eye surgery with low risk for bleeding. No need to hold medications.  I will route this recommendation to the requesting party via Epic fax function and remove from pre-op pool.  Please call with questions.  Bettey Mare. Liborio Nixon, ANP, AACC  02/15/2020, 4:22 PM

## 2020-02-15 NOTE — Telephone Encounter (Signed)
   [facility] Pre-operative Risk Assessment    Request for surgical clearance:  1. What type of surgery is being performed? 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 When is this surgery scheduled? OD 04/03/20, OS 03/13/20  3. What type of clearance is required (medical clearance vs. Pharmacy clearance to hold med vs. Both)? Medical   4. Are there any medications that need to be held prior to surgery and how long? Not listed   5. Practice name and name of physician performing surgery? [facility], Dr Talbert Forest  6. What is your office phone number [phone]   7.   What is your office fax number [phone] Glenetta Borg   8.   Anesthesia type (None, local, MAC, general) ? Topical anesthesia with IV medication    Ace Gins 02/15/2020, 4:00 PM  _________________________________________________________________   (provider comments below)
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
## 2020-02-29 ENCOUNTER — Encounter: Payer: Self-pay | Admitting: Neurology

## 2020-02-29 ENCOUNTER — Ambulatory Visit (INDEPENDENT_AMBULATORY_CARE_PROVIDER_SITE_OTHER): Payer: Medicare Other | Admitting: Neurology

## 2020-02-29 ENCOUNTER — Other Ambulatory Visit: Payer: Self-pay

## 2020-02-29 VITALS — BP 132/82 | HR 79 | Ht 65.0 in | Wt 218.0 lb

## 2020-02-29 DIAGNOSIS — G3184 Mild cognitive impairment, so stated: Secondary | ICD-10-CM

## 2020-02-29 DIAGNOSIS — I10 Essential (primary) hypertension: Secondary | ICD-10-CM

## 2020-02-29 DIAGNOSIS — G4733 Obstructive sleep apnea (adult) (pediatric): Secondary | ICD-10-CM

## 2020-02-29 DIAGNOSIS — G459 Transient cerebral ischemic attack, unspecified: Secondary | ICD-10-CM

## 2020-02-29 DIAGNOSIS — G43009 Migraine without aura, not intractable, without status migrainosus: Secondary | ICD-10-CM

## 2020-02-29 DIAGNOSIS — E782 Mixed hyperlipidemia: Secondary | ICD-10-CM

## 2020-02-29 DIAGNOSIS — F418 Other specified anxiety disorders: Secondary | ICD-10-CM

## 2020-02-29 DIAGNOSIS — E119 Type 2 diabetes mellitus without complications: Secondary | ICD-10-CM

## 2020-02-29 NOTE — Patient Instructions (Signed)
1.  Continue nortriptyline 10mg  at bedtime 2.  I want you to see sleep medicine 3.  Follow up in 6 months.

## 2020-03-20 ENCOUNTER — Other Ambulatory Visit: Payer: Self-pay

## 2020-03-20 ENCOUNTER — Ambulatory Visit (INDEPENDENT_AMBULATORY_CARE_PROVIDER_SITE_OTHER): Payer: Medicare Other | Admitting: Pulmonary Disease

## 2020-03-20 ENCOUNTER — Encounter: Payer: Self-pay | Admitting: Pulmonary Disease

## 2020-03-20 VITALS — BP 128/66 | HR 72 | Temp 97.3°F | Ht 66.0 in | Wt 217.8 lb

## 2020-03-20 DIAGNOSIS — G4733 Obstructive sleep apnea (adult) (pediatric): Secondary | ICD-10-CM | POA: Diagnosis not present

## 2020-03-20 NOTE — Progress Notes (Signed)
Destiny Savage    [id number]    October 31, 1951  Primary Care Physician:Tate, Leona Carry, MD  Referring Physician: Albina Billet, MD [address]   [place],  [place] [address]  Chief complaint:   Patient is being seen for concern for sleep disordered breathing Diagnosed with sleep apnea in the past  HPI:  Patient has a diagnosis of obstructive sleep apnea for which he uses CPAP intermittently Mask is not well-tolerated She states she still uses it here and there but noncommittal with respect to how many days she uses it She does not remember when the initial study was done but it was 4 to 5 years ago  Was recently being evaluated for headaches and TIAs, some neurocognitive decline  Has a history of hypertension, asthma, diabetes,  Admits to dryness of her mouth in the mornings Admits to headaches Never smoker Usually goes to bed between 10 and 12 Takes a 1 to 2 hours to fall asleep Final wake up time between 9 AM and 10 AM  She feels well rested  She does take naps about 20 to 30 minutes if she is tired, this does not happen on a daily basis  Outpatient Encounter Medications as of 03/20/2020  Medication Sig  . albuterol (PROAIR HFA) 108 (90 Base) MCG/ACT inhaler Inhale 1 puff into the lungs every 6 (six) hours as needed for wheezing.  Marland Kitchen aspirin 81 MG tablet Take 1 tablet (81 mg total) by mouth daily.  . carvedilol (COREG) 6.25 MG tablet Take 6.25 mg by mouth 2 (two) times daily with a meal.  . clopidogrel (PLAVIX) 75 MG tablet Take 75 mg by mouth daily.  . diazepam (VALIUM) 5 MG tablet Take 1 tablet 30 minutes prior to MRI  . furosemide (LASIX) 20 MG tablet Take 1 tablet (20 mg total) by mouth daily.  . metFORMIN (GLUCOPHAGE) 500 MG tablet Take 1,000 mg by mouth daily.  . metFORMIN (GLUCOPHAGE-XR) 500 MG 24 hr tablet Take 1,000 mg by mouth daily.  . nortriptyline (PAMELOR) 10 MG capsule Take 1 capsule (10 mg total) by mouth at bedtime.  Marland Kitchen omeprazole (PRILOSEC) 20  MG capsule Take 20 mg by mouth daily.  . ondansetron (ZOFRAN-ODT) 4 MG disintegrating tablet Take 4 mg by mouth every 8 (eight) hours as needed for nausea.  . potassium chloride (K-DUR,KLOR-CON) 10 MEQ tablet Take 10 mEq by mouth 2 (two) times daily.  Marland Kitchen triamcinolone cream (KENALOG) 0.5 % Apply 1 application topically 2 (two) times daily.  Marland Kitchen atorvastatin (LIPITOR) 40 MG tablet Take 1 tablet (40 mg total) by mouth daily at 6 PM.   No facility-administered encounter medications on file as of 03/20/2020.    Allergies as of 03/20/2020 - Review Complete 03/20/2020  Allergen Reaction Noted  . Latex Hives 05/10/2012  . Lidocaine Hives 05/10/2012  . Phenobarbital-belladonna alk Hives and Rash     Past Medical History:  Diagnosis Date  . Arthritis   . Asthma   . Diabetes mellitus   . GERD (gastroesophageal reflux disease)   . Hyperlipidemia   . Hypertension   . Mini stroke ([place])   . Stroke [facility])     Past Surgical History:  Procedure Laterality Date  . ABDOMINAL HYSTERECTOMY    . BREAST EXCISIONAL BIOPSY Left 1980's   benign  . CARDIAC CATHETERIZATION    . CHOLECYSTECTOMY      Family History  Problem Relation Age of Onset  . Heart Problems  Father   . Hypertension Other   . CAD Other   . Breast cancer Neg Hx     Social History   Socioeconomic History  . Marital status: Married    Spouse name: Not on file  . Number of children: 3  . Years of education: Not on file  . Highest education level: Some college, no degree  Occupational History  . Not on file  Tobacco Use  . Smoking status: Never Smoker  . Smokeless tobacco: Never Used  Substance and Sexual Activity  . Alcohol use: No  . Drug use: No  . Sexual activity: Yes    Partners: Male    Birth control/protection: Surgical  Other Topics Concern  . Not on file  Social History Narrative   Married lives with spouse   3 children   Left handed   1 year of college   Drinks coffee, tea and soda sometimes   Social  Determinants of Health   Financial Resource Strain:   . Difficulty of Paying Living Expenses:   Food Insecurity:   . Worried About Charity fundraiser in the Last Year:   . Arboriculturist in the Last Year:   Transportation Needs:   . Film/video editor (Medical):   Marland Kitchen Lack of Transportation (Non-Medical):   Physical Activity:   . Days of Exercise per Week:   . Minutes of Exercise per Session:   Stress:   . Feeling of Stress :   Social Connections:   . Frequency of Communication with Friends and Family:   . Frequency of Social Gatherings with Friends and Family:   . Attends Religious Services:   . Active Member of Clubs or Organizations:   . Attends Archivist Meetings:   Marland Kitchen Marital Status:   Intimate Partner Violence:   . Fear of Current or Ex-Partner:   . Emotionally Abused:   Marland Kitchen Physically Abused:   . Sexually Abused:     Review of Systems  Respiratory: Positive for apnea.   Psychiatric/Behavioral: Positive for sleep disturbance.    Vitals:   03/20/20 1111  BP: 128/66  Pulse: 72  Temp: (!) 97.3 F (36.3 C)  SpO2: 99%     Physical Exam  Constitutional: She appears well-developed.  Obese  HENT:  Head: Normocephalic and atraumatic.  Mallampati 3, crowded oropharynx  Eyes: Pupils are equal, round, and reactive to light. Conjunctivae are normal. Right eye exhibits no discharge.  Neck: No tracheal deviation present. No thyromegaly present.  Cardiovascular: Normal rate and regular rhythm.  Pulmonary/Chest: Effort normal and breath sounds normal. No respiratory distress. She has no wheezes. She has no rales. She exhibits no tenderness.  Musculoskeletal:        General: Normal range of motion.     Cervical back: Normal range of motion and neck supple.  Neurological: She is alert.  Skin: Skin is warm and dry. No erythema.  Psychiatric: She has a normal mood and affect.   Results of the Epworth flowsheet 03/20/2020  Sitting and reading 1  Watching TV 1    Sitting, inactive in a public place (e.g. a theatre or a meeting) 0  As a passenger in a car for an hour without a break 1  Lying down to rest in the afternoon when circumstances permit 3  Sitting and talking to someone 0  Sitting quietly after a lunch without alcohol 2  In a car, while stopped for a few minutes in traffic 0  Total score 8    Data Reviewed: Previous study not available Neurology notes reviewed from EMR  Assessment:  Obstructive sleep apnea -Suboptimal treatment  Excessive daytime sleepiness  Pathophysiology of sleep disordered breathing discussed with the patient Options of treatment discussed with the patient  Plan/Recommendations: We will schedule patient for home sleep study to ascertain severity of obstructive sleep apnea  Continue treatment with CPAP-she may require new device with a different interface  Risks with not treating sleep disordered breathing discussed with the patient  Will follow-up in 2 to 3 months   Sherrilyn Rist MD [facility] 03/20/2020, 11:16 AM  CC: Albina Billet, MD

## 2020-03-20 NOTE — Patient Instructions (Signed)
History of obstructive sleep apnea  Intermittent use of CPAP secondary to mask issues  We will obtain a home sleep study to ascertain severity of obstructive sleep apnea Treatment options will include CPAP, different interface from what she is using now  Follow-up in 3 months  Call with significant concerns   Sleep Apnea Sleep apnea is a condition in which breathing pauses or becomes shallow during sleep. Episodes of sleep apnea usually last 10 seconds or longer, and they may occur as many as 20 times an hour. Sleep apnea disrupts your sleep and keeps your body from getting the rest that it needs. This condition can increase your risk of certain health problems, including:  Heart attack.  Stroke.  Obesity.  Diabetes.  Heart failure.  Irregular heartbeat. What are the causes? There are three kinds of sleep apnea:  Obstructive sleep apnea. This kind is caused by a blocked or collapsed airway.  Central sleep apnea. This kind happens when the part of the brain that controls breathing does not send the correct signals to the muscles that control breathing.  Mixed sleep apnea. This is a combination of obstructive and central sleep apnea. The most common cause of this condition is a collapsed or blocked airway. An airway can collapse or become blocked if:  Your throat muscles are abnormally relaxed.  Your tongue and tonsils are larger than normal.  You are overweight.  Your airway is smaller than normal. What increases the risk? You are more likely to develop this condition if you:  Are overweight.  Smoke.  Have a smaller than normal airway.  Are elderly.  Are female.  Drink alcohol.  Take sedatives or tranquilizers.  Have a family history of sleep apnea. What are the signs or symptoms? Symptoms of this condition include:  Trouble staying asleep.  Daytime sleepiness and tiredness.  Irritability.  Loud snoring.  Morning headaches.  Trouble  concentrating.  Forgetfulness.  Decreased interest in sex.  Unexplained sleepiness.  Mood swings.  Personality changes.  Feelings of depression.  Waking up often during the night to urinate.  Dry mouth.  Sore throat. How is this diagnosed? This condition may be diagnosed with:  A medical history.  A physical exam.  A series of tests that are done while you are sleeping (sleep study). These tests are usually done in a sleep lab, but they may also be done at home. How is this treated? Treatment for this condition aims to restore normal breathing and to ease symptoms during sleep. It may involve managing health issues that can affect breathing, such as high blood pressure or obesity. Treatment may include:  Sleeping on your side.  Using a decongestant if you have nasal congestion.  Avoiding the use of depressants, including alcohol, sedatives, and narcotics.  Losing weight if you are overweight.  Making changes to your diet.  Quitting smoking.  Using a device to open your airway while you sleep, such as: ? An oral appliance. This is a custom-made mouthpiece that shifts your lower jaw forward. ? A continuous positive airway pressure (CPAP) device. This device blows air through a mask when you breathe out (exhale). ? A nasal expiratory positive airway pressure (EPAP) device. This device has valves that you put into each nostril. ? A bi-level positive airway pressure (BPAP) device. This device blows air through a mask when you breathe in (inhale) and breathe out (exhale).  Having surgery if other treatments do not work. During surgery, excess tissue is removed to create  a wider airway. It is important to get treatment for sleep apnea. Without treatment, this condition can lead to:  High blood pressure.  Coronary artery disease.  In men, an inability to achieve or maintain an erection (impotence).  Reduced thinking abilities. Follow these instructions at  home: Lifestyle  Make any lifestyle changes that your health care provider recommends.  Eat a healthy, well-balanced diet.  Take steps to lose weight if you are overweight.  Avoid using depressants, including alcohol, sedatives, and narcotics.  Do not use any products that contain nicotine or tobacco, such as cigarettes, e-cigarettes, and chewing tobacco. If you need help quitting, ask your health care provider. General instructions  Take over-the-counter and prescription medicines only as told by your health care provider.  If you were given a device to open your airway while you sleep, use it only as told by your health care provider.  If you are having surgery, make sure to tell your health care provider you have sleep apnea. You may need to bring your device with you.  Keep all follow-up visits as told by your health care provider. This is important. Contact a health care provider if:  The device that you received to open your airway during sleep is uncomfortable or does not seem to be working.  Your symptoms do not improve.  Your symptoms get worse. Get help right away if:  You develop: ? Chest pain. ? Shortness of breath. ? Discomfort in your back, arms, or stomach.  You have: ? Trouble speaking. ? Weakness on one side of your body. ? Drooping in your face. These symptoms may represent a serious problem that is an emergency. Do not wait to see if the symptoms will go away. Get medical help right away. Call your local emergency services (911 in the [place] Do not drive yourself to the hospital. Summary  Sleep apnea is a condition in which breathing pauses or becomes shallow during sleep.  The most common cause is a collapsed or blocked airway.  The goal of treatment is to restore normal breathing and to ease symptoms during sleep. This information is not intended to replace advice given to you by your health care provider. Make sure you discuss any questions you  have with your health care provider. Document Revised: 04/21/2019 Document Reviewed: 06/30/2018 Elsevier Patient Education  [place].

## 2020-03-20 NOTE — Progress Notes (Signed)
Cardiology Office Note  Date:  03/21/2020   ID:  Destiny Savage, DOB Mar 27, 1951, MRN [id number]  PCP:  Albina Billet, MD   Chief Complaint  Patient presents with  . Other    6 month follow up. "doing well."     HPI:  Destiny Savage is a 69 y.o. female with history of  TIA/stroke,  DM2,  HTN, HLD,  long history of nausea/vomiting/diarrhea without etiology with previous symptoms of chest discomfort,  Last stress test February 2017 showing no ischemia OSA, Not using her CPAP who presents for routine follow-up of her chest pain symptoms  Lab work reviewed  HAB1C 7.4 Total CHOL 150  Weight stable Non-smoker Feels well POOR SLEEP, restless in the house  No regular exercise program Had eye surgery, on drops  H/a stable, comes and goes  History of severe GI issues, chronic diarrhea Previously seen by GI at [facility]  EKG personally reviewed by myself on todays visit Shows normal sinus rhythm rate 77 bpm no significant ST or T wave changes  Other past medical history reviewed Prior office visit, frequent headaches In the ER 12/16/17 and 11/24/2017  for H/A MRI normal CT head ok Was given a prescription for Fioricet  Stress at home Husband with cancer, CLL? On chemo, XRT  hospital 01/15/17,  H/A with nausea Tingling in legs CT head with no acute findings MRI head with small vessel disease MRI lumbar spine, mild DJD Carotid <49% disease  She is discharged without clear diagnosis  lost her mother November 2017  Admission to [facility] 12/24/2015 with chest pain Negative cardiac enzymes, EKG relatively unchanged Echo showed EF 60-65%, no RWMA, GR1DD, PASP 47 mm Hg, and mild to moderate posterior pericardial effusion without evidence of hemodynamic compromise.  She remained chest pain free throughout her admission.   stress test 12/2015  no ischemia normal ejection fraction She denies any further episodes of chest pain  admitted to [facility] in 11/2001 for chest pain Dr.  Clayborn Bigness performed a cardiac cath that did not show any significant coroanry disease.  She was again admitted to [facility] in 2004 for chest pain and had a nuclear stress test that did not show any ischemia.  normal echo.   PMH:   has a past medical history of Arthritis, Asthma, Diabetes mellitus, GERD (gastroesophageal reflux disease), Hyperlipidemia, Hypertension, Mini stroke ([place]), and Stroke ([place]).  PSH:    Past Surgical History:  Procedure Laterality Date  . ABDOMINAL HYSTERECTOMY    . BREAST EXCISIONAL BIOPSY Left 1980's   benign  . CARDIAC CATHETERIZATION    . CHOLECYSTECTOMY      Current Outpatient Medications  Medication Sig Dispense Refill  . albuterol (PROAIR HFA) 108 (90 Base) MCG/ACT inhaler Inhale 1 puff into the lungs every 6 (six) hours as needed for wheezing.    Marland Kitchen aspirin 81 MG tablet Take 1 tablet (81 mg total) by mouth daily.    Marland Kitchen atorvastatin (LIPITOR) 40 MG tablet Take 1 tablet (40 mg total) by mouth daily at 6 PM. 90 tablet 3  . carvedilol (COREG) 6.25 MG tablet Take 6.25 mg by mouth 2 (two) times daily with a meal.    . clopidogrel (PLAVIX) 75 MG tablet Take 75 mg by mouth daily.    . diazepam (VALIUM) 5 MG tablet Take 1 tablet 30 minutes prior to MRI 1 tablet 0  . furosemide (LASIX) 20 MG tablet Take 1 tablet (20 mg total) by mouth daily. 90 tablet 1  . metFORMIN (  GLUCOPHAGE-XR) 500 MG 24 hr tablet Take 1,000 mg by mouth daily.    . nortriptyline (PAMELOR) 10 MG capsule Take 1 capsule (10 mg total) by mouth at bedtime. 30 capsule 3  . omeprazole (PRILOSEC) 20 MG capsule Take 20 mg by mouth daily.    . ondansetron (ZOFRAN-ODT) 4 MG disintegrating tablet Take 4 mg by mouth every 8 (eight) hours as needed for nausea.    . potassium chloride (K-DUR,KLOR-CON) 10 MEQ tablet Take 10 mEq by mouth 2 (two) times daily.    Marland Kitchen triamcinolone cream (KENALOG) 0.5 % Apply 1 application topically 2 (two) times daily.     No current facility-administered medications for this visit.      Allergies:   Latex, Lidocaine, and Phenobarbital-belladonna alk   Social History:  The patient  reports that she has never smoked. She has never used smokeless tobacco. She reports that she does not drink alcohol or use drugs.   Family History:   family history includes CAD in an other family member; Heart Problems in her father; Hypertension in an other family member.    Review of Systems: Review of Systems  Constitutional: Positive for malaise/fatigue.  HENT: Negative.   Respiratory: Negative.   Cardiovascular: Negative.   Gastrointestinal: Negative.   Musculoskeletal: Negative.   Neurological: Negative.   Psychiatric/Behavioral: Negative.   All other systems reviewed and are negative.   PHYSICAL EXAM: VS:  BP 140/80 (BP Location: Left Arm, Patient Position: Sitting, Cuff Size: Normal)   Pulse 77   Ht '5\' 6"'$  (1.676 m)   Wt 218 lb 8 oz (99.1 kg)   BMI 35.27 kg/m  , BMI Body mass index is 35.27 kg/m. Constitutional:  oriented to person, place, and time. No distress.  HENT:  Head: Grossly normal Eyes:  no discharge. No scleral icterus.  Neck: No JVD, no carotid bruits  Cardiovascular: Regular rate and rhythm, no murmurs appreciated Pulmonary/Chest: Clear to auscultation bilaterally, no wheezes or rails Abdominal: Soft.  no distension.  no tenderness.  Musculoskeletal: Normal range of motion Neurological:  normal muscle tone. Coordination normal. No atrophy Skin: Skin warm and dry Psychiatric: normal affect, pleasant  Recent Labs: 10/04/2019: ALT 9; Hemoglobin 11.8; Platelets 274    Lipid Panel Lab Results  Component Value Date   CHOL 152 10/04/2019   HDL 60 10/04/2019   LDLCALC 76 10/04/2019   TRIG 86 10/04/2019    Wt Readings from Last 3 Encounters:  03/21/20 218 lb 8 oz (99.1 kg)  03/20/20 217 lb 12.8 oz (98.8 kg)  02/29/20 218 lb (98.9 kg)     ASSESSMENT AND PLAN:  Essential hypertension -  Blood pressure is well controlled on today's visit. No  changes made to the medications.  Mixed hyperlipidemia - Cholesterol is at goal on the current lipid regimen. No changes to the medications were made.  Cerebrovascular accident (CVA), unspecified mechanism ([place])  Long history of headaches, stable previous MRI with no clear finding of stroke  Controlled type 2 diabetes mellitus without complication, without long-term current use of insulin ([place]) - We have encouraged continued exercise, careful diet management in an effort to lose weight. HBA1C 7.4   Total encounter time more than 25 minutes  Greater than 50% was spent in counseling and coordination of care with the patient  Disposition:   F/U  12 months   Orders Placed This Encounter  Procedures  . EKG 12-Lead     Signed, Esmond Plants, M.D., Ph.D. 03/21/2020  [facility]  [place], [place]

## 2020-03-21 ENCOUNTER — Encounter: Payer: Self-pay | Admitting: Cardiovascular Disease

## 2020-03-21 ENCOUNTER — Other Ambulatory Visit: Payer: Self-pay

## 2020-03-21 ENCOUNTER — Ambulatory Visit (INDEPENDENT_AMBULATORY_CARE_PROVIDER_SITE_OTHER): Payer: Medicare Other | Admitting: Cardiovascular Disease

## 2020-03-21 VITALS — BP 140/80 | HR 77 | Ht 66.0 in | Wt 218.5 lb

## 2020-03-21 DIAGNOSIS — I5189 Other ill-defined heart diseases: Secondary | ICD-10-CM

## 2020-03-21 DIAGNOSIS — I10 Essential (primary) hypertension: Secondary | ICD-10-CM | POA: Diagnosis not present

## 2020-03-21 DIAGNOSIS — G459 Transient cerebral ischemic attack, unspecified: Secondary | ICD-10-CM | POA: Diagnosis not present

## 2020-03-21 DIAGNOSIS — E782 Mixed hyperlipidemia: Secondary | ICD-10-CM

## 2020-03-21 DIAGNOSIS — G4730 Sleep apnea, unspecified: Secondary | ICD-10-CM

## 2020-03-21 MED ORDER — ATORVASTATIN CALCIUM 40 MG PO TABS: 40.0000 mg | ORAL_TABLET | Freq: Every day | ORAL | 3 refills | Status: DC

## 2020-03-21 MED ORDER — FUROSEMIDE 20 MG PO TABS: 20.0000 mg | ORAL_TABLET | Freq: Every day | ORAL | 3 refills | Status: DC

## 2020-03-21 MED ORDER — POTASSIUM CHLORIDE CRYS ER 10 MEQ PO TBCR: 10.0000 meq | EXTENDED_RELEASE_TABLET | Freq: Two times a day (BID) | ORAL | 3 refills | Status: DC

## 2020-03-21 MED ORDER — CARVEDILOL 6.25 MG PO TABS: 6.2500 mg | ORAL_TABLET | Freq: Two times a day (BID) | ORAL | 3 refills | Status: AC

## 2020-03-21 MED ORDER — CLOPIDOGREL BISULFATE 75 MG PO TABS: 75.0000 mg | ORAL_TABLET | Freq: Every day | ORAL | 3 refills | Status: AC

## 2020-03-21 NOTE — Patient Instructions (Signed)

## 2020-03-23 ENCOUNTER — Telehealth: Payer: Self-pay | Admitting: Cardiovascular Disease

## 2020-03-23 NOTE — Telephone Encounter (Signed)
Please call to discuss why patient needs to be taking potassium.

## 2020-03-23 NOTE — Telephone Encounter (Signed)
Spoke with patient and explained why she needed the medication. She then verbalized understanding with no further questions at this time.

## 2020-03-29 ENCOUNTER — Telehealth: Payer: Self-pay | Admitting: Cardiovascular Disease

## 2020-03-29 NOTE — Telephone Encounter (Signed)
Left voicemail message to call back on both home and mobile numbers.  

## 2020-03-29 NOTE — Telephone Encounter (Signed)
Pt c/o medication issue:  1. Name of Medication: Potassium   2. How are you currently taking this medication (dosage and times per day)? 10 mEq BID   3. Are you having a reaction (difficulty breathing--STAT)? Not sure   4. What is your medication issue?  Redness on elbow and bruising on thigh

## 2020-03-30 NOTE — Telephone Encounter (Signed)
Spoke with patient and reviewed that these are typical effects from potassium. Instructed her to monitor those areas and continue medication. Requested that she please call us back if it should not get better or gets worse. She verbalized understanding with no further questions at this time.

## 2020-04-01 ENCOUNTER — Other Ambulatory Visit: Payer: Self-pay | Admitting: Physician Assistant

## 2020-04-03 ENCOUNTER — Ambulatory Visit: Payer: Medicare Other | Admitting: Cardiovascular Disease

## 2020-05-01 ENCOUNTER — Telehealth: Payer: Self-pay | Admitting: Cardiovascular Disease

## 2020-05-01 NOTE — Telephone Encounter (Signed)
Call to patient.   Left detailed message to take k cl at the same time, once a day, when she takes fluid pill.   No further concerns at this time.

## 2020-05-01 NOTE — Telephone Encounter (Signed)
Patient calling Patient takes potassium chloride 10 MEQ 2 times daily Patient has been taking both pills at the same time in the morning Wants to know if this is ok or if she should space out the time with which she takes Please call to discuss

## 2020-05-26 ENCOUNTER — Other Ambulatory Visit: Payer: Self-pay

## 2020-05-26 DIAGNOSIS — G4733 Obstructive sleep apnea (adult) (pediatric): Secondary | ICD-10-CM

## 2020-06-05 ENCOUNTER — Telehealth (HOSPITAL_COMMUNITY): Payer: Self-pay | Admitting: Pulmonary Disease

## 2020-06-05 DIAGNOSIS — G4733 Obstructive sleep apnea (adult) (pediatric): Secondary | ICD-10-CM

## 2020-06-05 NOTE — Telephone Encounter (Signed)
Call patient  Sleep study result  Date of study: 05/26/2020  Impression: Mild obstructive sleep apnea  Recommendation: CPAP can be considered as an option of treatment, auto titrating CPAP at 5-15 will be of benefit An oral device may also be considered as an option of treatment of mild sleep disordered breathing Encourage weight loss measures Sleep position optimization by encouraging sleeping on lateral position and elevation of the head of the bed by about 30 degrees may help  Follow-up as previously scheduled, 4 to 6 weeks following initiation of treatment

## 2020-06-06 NOTE — Telephone Encounter (Signed)
Patient reporting skin reaction to full face mask, can we order a dreamwear mask? Please advise  Patient called with results of home sleep study, she wants to start CPAP therapy. Order placed to DME for new machine, hers is greater than 68 years old. Follow up recall placed.

## 2020-06-09 NOTE — Telephone Encounter (Signed)
Yes, we can order dreamwear mask

## 2020-06-09 NOTE — Telephone Encounter (Signed)
Patient contacted that order for DreamWear mask sent. Patient does not have a DME and needs to establish a local DME for supplies.

## 2020-08-03 ENCOUNTER — Ambulatory Visit: Payer: Medicare Other | Admitting: Neurology
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
## 2020-08-28 ENCOUNTER — Other Ambulatory Visit: Payer: Self-pay

## 2020-08-28 ENCOUNTER — Telehealth: Payer: Self-pay | Admitting: Pulmonary Disease

## 2020-08-28 ENCOUNTER — Ambulatory Visit (INDEPENDENT_AMBULATORY_CARE_PROVIDER_SITE_OTHER): Payer: Medicare Other | Admitting: Neurology

## 2020-08-28 ENCOUNTER — Other Ambulatory Visit: Payer: Self-pay | Admitting: Pulmonary Disease

## 2020-08-28 ENCOUNTER — Encounter: Payer: Self-pay | Admitting: Neurology

## 2020-08-28 VITALS — BP 133/80 | HR 81 | Ht 66.0 in | Wt 222.6 lb

## 2020-08-28 DIAGNOSIS — G3184 Mild cognitive impairment, so stated: Secondary | ICD-10-CM | POA: Diagnosis not present

## 2020-08-28 DIAGNOSIS — G43009 Migraine without aura, not intractable, without status migrainosus: Secondary | ICD-10-CM | POA: Diagnosis not present

## 2020-08-28 DIAGNOSIS — G4733 Obstructive sleep apnea (adult) (pediatric): Secondary | ICD-10-CM

## 2020-08-28 NOTE — Patient Instructions (Addendum)
1.  Continue nortriptyline 10mg  at bedtime for migraine prevention 2.  Use extra-strength Tylenol for headaches but limit use of pain relievers to no more than 2 days out of week to prevent risk of rebound or medication-overuse headache. 3.  Contact Dr. Olalere's office regarding CPAP machine.  His office number is [phone]. 4.  Read about the Mediterranean diet below 5.  Follow up in 9 months.   Mediterranean Diet A Mediterranean diet refers to food and lifestyle choices that are based on the traditions of countries located on the [phone]. This way of eating has been shown to help prevent certain conditions and improve outcomes for people who have chronic diseases, like kidney disease and heart disease. What are tips for following this plan? Lifestyle  Cook and eat meals together with your family, when possible.  Drink enough fluid to keep your urine clear or pale yellow.  Be physically active every day. This includes: ? Aerobic exercise like running or swimming. ? Leisure activities like gardening, walking, or housework.  Get 7-8 hours of sleep each night.  If recommended by your health care provider, drink red wine in moderation. This means 1 glass a day for nonpregnant women and 2 glasses a day for men. A glass of wine equals 5 oz (150 mL). Reading food labels   Check the serving size of packaged foods. For foods such as rice and pasta, the serving size refers to the amount of cooked product, not dry.  Check the total fat in packaged foods. Avoid foods that have saturated fat or trans fats.  Check the ingredients list for added sugars, such as corn syrup. Shopping  At the grocery store, buy most of your food from the areas near the walls of the store. This includes: ? Fresh fruits and vegetables (produce). ? Grains, beans, nuts, and seeds. Some of these may be available in unpackaged forms or large amounts (in bulk). ? Fresh seafood. ? Poultry and  eggs. ? Low-fat dairy products.  Buy whole ingredients instead of prepackaged foods.  Buy fresh fruits and vegetables in-season from local farmers markets.  Buy frozen fruits and vegetables in resealable bags.  If you do not have access to quality fresh seafood, buy precooked frozen shrimp or canned fish, such as tuna, salmon, or sardines.  Buy small amounts of raw or cooked vegetables, salads, or olives from the deli or salad bar at your store.  Stock your pantry so you always have certain foods on hand, such as olive oil, canned tuna, canned tomatoes, rice, pasta, and beans. Cooking  Cook foods with extra-virgin olive oil instead of using butter or other vegetable oils.  Have meat as a side dish, and have vegetables or grains as your main dish. This means having meat in small portions or adding small amounts of meat to foods like pasta or stew.  Use beans or vegetables instead of meat in common dishes like chili or lasagna.  Experiment with different cooking methods. Try roasting or broiling vegetables instead of steaming or sauteing them.  Add frozen vegetables to soups, stews, pasta, or rice.  Add nuts or seeds for added healthy fat at each meal. You can add these to yogurt, salads, or vegetable dishes.  Marinate fish or vegetables using olive oil, lemon juice, garlic, and fresh herbs. Meal planning   Plan to eat 1 vegetarian meal one day each week. Try to work up to 2 vegetarian meals, if possible.  Eat seafood 2 or more  times a week.  Have healthy snacks readily available, such as: ? Vegetable sticks with hummus. ? [place] yogurt. ? Fruit and nut trail mix.  Eat balanced meals throughout the week. This includes: ? Fruit: 2-3 servings a day ? Vegetables: 4-5 servings a day ? Low-fat dairy: 2 servings a day ? Fish, poultry, or lean meat: 1 serving a day ? Beans and legumes: 2 or more servings a week ? Nuts and seeds: 1-2 servings a day ? Whole grains: 6-8 servings a  day ? Extra-virgin olive oil: 3-4 servings a day  Limit red meat and sweets to only a few servings a month What are my food choices?  Mediterranean diet ? Recommended  Grains: Whole-grain pasta. Brown rice. Bulgar wheat. Polenta. Couscous. Whole-wheat bread. Orpah Cobb.  Vegetables: Artichokes. Beets. Broccoli. Cabbage. Carrots. Eggplant. Green beans. Chard. Kale. Spinach. Onions. Leeks. Peas. Squash. Tomatoes. Peppers. Radishes.  Fruits: Apples. Apricots. Avocado. Berries. Bananas. Cherries. Dates. Figs. Grapes. Lemons. Melon. Oranges. Peaches. Plums. Pomegranate.  Meats and other protein foods: Beans. Almonds. Sunflower seeds. Pine nuts. Peanuts. Cod. Salmon. Scallops. Shrimp. Tuna. Tilapia. Clams. Oysters. Eggs.  Dairy: Low-fat milk. Cheese. Greek yogurt.  Beverages: Water. Red wine. Herbal tea.  Fats and oils: Extra virgin olive oil. Avocado oil. Grape seed oil.  Sweets and desserts: [place] yogurt with honey. Baked apples. Poached pears. Trail mix.  Seasoning and other foods: Basil. Cilantro. Coriander. Cumin. Mint. Parsley. Sage. Rosemary. Tarragon. Garlic. Oregano. Thyme. Pepper. Balsalmic vinegar. Tahini. Hummus. Tomato sauce. Olives. Mushrooms. ? Limit these  Grains: Prepackaged pasta or rice dishes. Prepackaged cereal with added sugar.  Vegetables: Deep fried potatoes (french fries).  Fruits: Fruit canned in syrup.  Meats and other protein foods: Beef. Pork. Lamb. Poultry with skin. Hot dogs. Tomasa Blase.  Dairy: Ice cream. Sour cream. Whole milk.  Beverages: Juice. Sugar-sweetened soft drinks. Beer. Liquor and spirits.  Fats and oils: Butter. Canola oil. Vegetable oil. Beef fat (tallow). Lard.  Sweets and desserts: Cookies. Cakes. Pies. Candy.  Seasoning and other foods: Mayonnaise. Premade sauces and marinades. The items listed may not be a complete list. Talk with your dietitian about what dietary choices are right for you. Summary  The Mediterranean diet  includes both food and lifestyle choices.  Eat a variety of fresh fruits and vegetables, beans, nuts, seeds, and whole grains.  Limit the amount of red meat and sweets that you eat.  Talk with your health care provider about whether it is safe for you to drink red wine in moderation. This means 1 glass a day for nonpregnant women and 2 glasses a day for men. A glass of wine equals 5 oz (150 mL). This information is not intended to replace advice given to you by your health care provider. Make sure you discuss any questions you have with your health care provider. Document Revised: 07/04/2016 Document Reviewed: 06/27/2016 Elsevier Patient Education  2020 [employer].

## 2020-08-28 NOTE — Telephone Encounter (Addendum)
Order for cpap was placed wrong so was resubmitted to dme company [employer] with correct documentation.pt was called notified new order sent in

## 2020-09-06 ENCOUNTER — Telehealth: Payer: Self-pay | Admitting: Neurology

## 2020-09-06 NOTE — Telephone Encounter (Signed)
Patient called in and stated that the Cpap mask that was ordered for her does not go with her older machine. She will need an order for a newer Cpap machine that will go with that mask.

## 2020-09-06 NOTE — Telephone Encounter (Signed)
Telephone call to pt,Advised pt that [facility] ordered her CPAP. There is a note from 08/28/20 stating they will resubmit an order.  Pt states her machine is a F20 model.   Will send [facility] a message to see if they can call pt.

## 2020-09-11 ENCOUNTER — Telehealth: Payer: Self-pay | Admitting: Pulmonary Disease

## 2020-09-11 NOTE — Telephone Encounter (Signed)
Forwarding to [facility] per protocol 

## 2020-09-12 NOTE — Telephone Encounter (Signed)
Message sent to [employer] 

## 2020-09-15 NOTE — Telephone Encounter (Signed)
Per Brad @ [employer] they have spoke to the patient and she has received her mask

## 2020-11-02 ENCOUNTER — Other Ambulatory Visit: Payer: Self-pay | Admitting: Internal Medicine

## 2020-11-02 DIAGNOSIS — Z1231 Encounter for screening mammogram for malignant neoplasm of breast: Secondary | ICD-10-CM

## 2020-12-11 ENCOUNTER — Other Ambulatory Visit: Payer: Self-pay

## 2020-12-11 ENCOUNTER — Ambulatory Visit
Admission: RE | Admit: 2020-12-11 | Discharge: 2020-12-11 | Disposition: A | Discharge status: Home / Self Care | Payer: Medicare Other | Source: Ambulatory Visit | Attending: Internal Medicine | Admitting: Internal Medicine

## 2020-12-11 DIAGNOSIS — Z1231 Encounter for screening mammogram for malignant neoplasm of breast: Secondary | ICD-10-CM | POA: Diagnosis present

## 2020-12-25 ENCOUNTER — Other Ambulatory Visit: Payer: Self-pay | Admitting: Cardiovascular Disease

## 2021-02-28 ENCOUNTER — Other Ambulatory Visit: Payer: Self-pay | Admitting: Internal Medicine

## 2021-02-28 DIAGNOSIS — R109 Unspecified abdominal pain: Secondary | ICD-10-CM

## 2021-03-07 ENCOUNTER — Emergency Department: Payer: Medicare Other

## 2021-03-07 ENCOUNTER — Other Ambulatory Visit: Payer: Self-pay

## 2021-03-07 ENCOUNTER — Emergency Department
Admission: EM | Admit: 2021-03-07 | Discharge: 2021-03-07 | Disposition: A | Discharge status: Home / Self Care | Payer: Medicare Other | Attending: Emergency Medicine | Admitting: Emergency Medicine

## 2021-03-07 DIAGNOSIS — R519 Headache, unspecified: Secondary | ICD-10-CM | POA: Diagnosis not present

## 2021-03-07 DIAGNOSIS — I10 Essential (primary) hypertension: Secondary | ICD-10-CM | POA: Diagnosis not present

## 2021-03-07 DIAGNOSIS — Z7902 Long term (current) use of antithrombotics/antiplatelets: Secondary | ICD-10-CM | POA: Diagnosis not present

## 2021-03-07 DIAGNOSIS — R109 Unspecified abdominal pain: Secondary | ICD-10-CM | POA: Diagnosis present

## 2021-03-07 DIAGNOSIS — J45909 Unspecified asthma, uncomplicated: Secondary | ICD-10-CM | POA: Insufficient documentation

## 2021-03-07 DIAGNOSIS — Z7984 Long term (current) use of oral hypoglycemic drugs: Secondary | ICD-10-CM | POA: Insufficient documentation

## 2021-03-07 DIAGNOSIS — Z79899 Other long term (current) drug therapy: Secondary | ICD-10-CM | POA: Insufficient documentation

## 2021-03-07 DIAGNOSIS — R1084 Generalized abdominal pain: Secondary | ICD-10-CM | POA: Diagnosis not present

## 2021-03-07 DIAGNOSIS — Z91040 Latex allergy status: Secondary | ICD-10-CM | POA: Diagnosis not present

## 2021-03-07 DIAGNOSIS — Z7982 Long term (current) use of aspirin: Secondary | ICD-10-CM | POA: Diagnosis not present

## 2021-03-07 DIAGNOSIS — Z9049 Acquired absence of other specified parts of digestive tract: Secondary | ICD-10-CM | POA: Diagnosis not present

## 2021-03-07 DIAGNOSIS — E119 Type 2 diabetes mellitus without complications: Secondary | ICD-10-CM | POA: Insufficient documentation

## 2021-03-07 DIAGNOSIS — K219 Gastro-esophageal reflux disease without esophagitis: Secondary | ICD-10-CM | POA: Diagnosis not present

## 2021-03-07 LAB — CBC WITH DIFFERENTIAL/PLATELET
Abs Immature Granulocytes: 0.03 10*3/uL (ref 0.00–0.07)
Basophils Absolute: 0 10*3/uL (ref 0.0–0.1)
Basophils Relative: 1 %
Eosinophils Absolute: 0.4 10*3/uL (ref 0.0–0.5)
Eosinophils Relative: 4 %
HCT: 36.2 % (ref 36.0–46.0)
Hemoglobin: 12 g/dL (ref 12.0–15.0)
Immature Granulocytes: 0 %
Lymphocytes Relative: 19 %
Lymphs Abs: 1.7 10*3/uL (ref 0.7–4.0)
MCH: 29.7 pg (ref 26.0–34.0)
MCHC: 33.1 g/dL (ref 30.0–36.0)
MCV: 89.6 fL (ref 80.0–100.0)
Monocytes Absolute: 0.5 10*3/uL (ref 0.1–1.0)
Monocytes Relative: 5 %
Neutro Abs: 6.2 10*3/uL (ref 1.7–7.7)
Neutrophils Relative %: 71 %
Platelets: 255 10*3/uL (ref 150–400)
RBC: 4.04 MIL/uL (ref 3.87–5.11)
RDW: 12.7 % (ref 11.5–15.5)
WBC: 8.8 10*3/uL (ref 4.0–10.5)
nRBC: 0 % (ref 0.0–0.2)

## 2021-03-07 LAB — URINALYSIS, COMPLETE (UACMP) WITH MICROSCOPIC
Bilirubin Urine: NEGATIVE
Glucose, UA: NEGATIVE mg/dL
Hgb urine dipstick: NEGATIVE
Ketones, ur: NEGATIVE mg/dL
Leukocytes,Ua: NEGATIVE
Nitrite: NEGATIVE
Protein, ur: NEGATIVE mg/dL
Specific Gravity, Urine: 1.009 (ref 1.005–1.030)
pH: 7 (ref 5.0–8.0)

## 2021-03-07 LAB — COMPREHENSIVE METABOLIC PANEL
ALT: 11 U/L (ref 0–44)
AST: 16 U/L (ref 15–41)
Albumin: 4.2 g/dL (ref 3.5–5.0)
Alkaline Phosphatase: 50 U/L (ref 38–126)
Anion gap: 10 (ref 5–15)
BUN: 11 mg/dL (ref 8–23)
CO2: 27 mmol/L (ref 22–32)
Calcium: 9.5 mg/dL (ref 8.9–10.3)
Chloride: 100 mmol/L (ref 98–111)
Creatinine, Ser: 0.68 mg/dL (ref 0.44–1.00)
GFR, Estimated: >60 mL/min (ref >60)
Glucose, Bld: 131 mg/dL — ABNORMAL HIGH (ref 70–99)
Potassium: 3.9 mmol/L (ref 3.5–5.1)
Sodium: 137 mmol/L (ref 135–145)
Total Bilirubin: 0.8 mg/dL (ref 0.3–1.2)
Total Protein: 7.1 g/dL (ref 6.5–8.1)

## 2021-03-07 MED ORDER — CLONIDINE HCL 0.1 MG PO TABS
0.2000 mg | ORAL_TABLET | Freq: Once | ORAL | Status: AC
  Administered 2021-03-07: 0.2 mg via ORAL
  Filled 2021-03-07: qty 2

## 2021-03-07 MED ORDER — MORPHINE SULFATE (PF) 4 MG/ML IV SOLN
4.0000 mg | Freq: Once | INTRAVENOUS | Status: AC
Start: 2021-03-07 — End: 2021-03-07
  Administered 2021-03-07: 4 mg via INTRAVENOUS
  Filled 2021-03-07: qty 1

## 2021-03-07 MED ORDER — IOHEXOL 300 MG/ML  SOLN
100.0000 mL | Freq: Once | INTRAMUSCULAR | Status: AC | PRN
  Administered 2021-03-07: 100 mL via INTRAVENOUS

## 2021-03-07 MED ORDER — DICYCLOMINE HCL 10 MG PO CAPS: 10.0000 mg | ORAL_CAPSULE | Freq: Four times a day (QID) | ORAL | 0 refills | Status: DC

## 2021-03-07 MED ORDER — SODIUM CHLORIDE 0.9 % IV BOLUS
1000.0000 mL | Freq: Once | INTRAVENOUS | Status: AC
  Administered 2021-03-07: 1000 mL via INTRAVENOUS

## 2021-03-07 MED ORDER — ONDANSETRON 4 MG PO TBDP: 4.0000 mg | ORAL_TABLET | Freq: Three times a day (TID) | ORAL | 0 refills | Status: DC | PRN

## 2021-03-07 NOTE — ED Triage Notes (Signed)
PT comes into the ED via POV c/o intermittent abdominal pain, left flank pain and headache.  Pt has h/o IBS.  Pt has had some nausea associated with it as well.  Pt denies any urinary problems.  Pt ambulatory to triage and in NAD with even and unlabored respirations.

## 2021-03-07 NOTE — ED Provider Notes (Signed)
[facility] Emergency Department Provider Note  ____________________________________________   Event Date/Time   First MD Initiated Contact with Patient 03/07/21 1212     (approximate)  I have reviewed the triage vital signs and the nursing notes.   HISTORY  Chief Complaint Abdominal Pain and Headache    HPI [name] is a 70 y.o. female presents emergency department complaining of abdominal pain.  Patient's had intermittent abdominal pain for about 2 months but is worsened over the last few days.  Patient states she is having incontinence with her stool.  States when she has to urinate she will wipe and have loose stool toilet paper.  Also complaining of headaches.  Patient has taken her blood pressure medication as indicated by her physician.  She denies any fever or chills.  No cough or congestion.  No history of kidney stones.  Patient did have a cholecystectomy.  States that the stools are green most of the time and do float    Past Medical History:  Diagnosis Date  . Arthritis   . Asthma   . Diabetes mellitus   . GERD (gastroesophageal reflux disease)   . Hyperlipidemia   . Hypertension   . Mini stroke ([place])   . Stroke [facility])     Patient Active Problem List   Diagnosis Date Noted  . 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 Stroke ([place]) 01/10/2017  . Mixed hyperlipidemia 11/21/2016  . Neck pain 11/21/2016  . Chest pain 12/23/2015  . GERD (gastroesophageal reflux disease) 12/23/2015  . Abdominal pain 04/30/2013  . Vertigo 05/11/2012  . Hypertension 05/11/2012  . Diabetes type 2, controlled ([place]) 05/11/2012  . Neck pain on right side 05/11/2012    Past Surgical History:  Procedure Laterality Date  . ABDOMINAL HYSTERECTOMY    . BREAST EXCISIONAL BIOPSY Left 1980's   benign  . CARDIAC CATHETERIZATION    . CHOLECYSTECTOMY      Prior to Admission medications   Medication Sig  Start Date End Date Taking? Authorizing Provider  dicyclomine (BENTYL) 10 MG capsule Take 1 capsule (10 mg total) by mouth 4 (four) times daily for 14 days. 03/07/21 03/21/21 Yes Donavan Kerlin, Linden Dolin, PA-C  ondansetron (ZOFRAN-ODT) 4 MG disintegrating tablet Take 1 tablet (4 mg total) by mouth every 8 (eight) hours as needed. 03/07/21  Yes Scottie Stanish, Linden Dolin, PA-C  albuterol (PROAIR HFA) 108 (90 Base) MCG/ACT inhaler Inhale 1 puff into the lungs every 6 (six) hours as needed for wheezing.    [provider]  aspirin 81 MG tablet Take 1 tablet (81 mg total) by mouth daily. 01/11/17   Hillary Bow, MD  atorvastatin (LIPITOR) 40 MG tablet Take 1 tablet (40 mg total) by mouth daily at 6 PM. 03/21/20 06/19/20  Gollan, Kathlene November, MD  carvedilol (COREG) 6.25 MG tablet Take 1 tablet (6.25 mg total) by mouth 2 (two) times daily with a meal. 03/21/20   Gollan, Kathlene November, MD  clopidogrel (PLAVIX) 75 MG tablet Take 1 tablet (75 mg total) by mouth daily. 03/21/20   Minna Merritts, MD  diazepam (VALIUM) 5 MG tablet Take 1 tablet 30 minutes prior to MRI Patient not taking: Reported on 08/28/2020 06/30/19   Pieter Partridge, DO  furosemide (LASIX) 20 MG tablet Take 1 tablet by mouth once daily 12/25/20   Minna Merritts, MD  metFORMIN (GLUCOPHAGE-XR) 500 MG 24 hr tablet Take 1,000 mg by mouth daily. 11/23/19  [provider]  nortriptyline (PAMELOR) 10 MG capsule Take 1 capsule (10 mg total) by mouth at bedtime. 06/25/19   Pieter Partridge, DO  omeprazole (PRILOSEC) 20 MG capsule Take 20 mg by mouth daily.    [provider]  potassium chloride (KLOR-CON) 10 MEQ tablet Take 1 tablet (10 mEq total) by mouth 2 (two) times daily. 03/21/20   Minna Merritts, MD  triamcinolone cream (KENALOG) 0.5 % Apply 1 application topically 2 (two) times daily. 07/13/18   [provider]    Allergies Latex, Lidocaine, and Phenobarbital-belladonna alk  Family History  Problem Relation Age of Onset  . Heart Problems  Father   . Hypertension Other   . CAD Other   . Breast cancer Neg Hx     Social History Social History   Tobacco Use  . Smoking status: Never Smoker  . Smokeless tobacco: Never Used  Vaping Use  . Vaping Use: Never used  Substance Use Topics  . Alcohol use: No  . Drug use: No    Review of Systems  Constitutional: No fever/chills Eyes: No visual changes. ENT: No sore throat. Respiratory: Denies cough Cardiovascular: Denies chest pain Gastrointestinal: Positive abdominal pain Genitourinary: Negative for dysuria. Musculoskeletal: Negative for back pain. Skin: Negative for rash. Psychiatric: no mood changes,     ____________________________________________   PHYSICAL EXAM:  VITAL SIGNS: ED Triage Vitals  Enc Vitals Group     BP 03/07/21 1213 (!) 186/100     Pulse Rate 03/07/21 1213 79     Resp 03/07/21 1213 18     Temp 03/07/21 1213 98.9 F (37.2 C)     Temp Source 03/07/21 1213 Oral     SpO2 03/07/21 1213 99 %     Weight 03/07/21 1215 213 lb (96.6 kg)     Height 03/07/21 1215 '5\' 5"'  (1.651 m)     Head Circumference --      Peak Flow --      Pain Score 03/07/21 1214 5     Pain Loc --      Pain Edu? --      Excl. in [place]? --     Constitutional: Alert and oriented. Well appearing and in no acute distress. Eyes: Conjunctivae are normal.  Head: Atraumatic. Nose: No congestion/rhinnorhea. Mouth/Throat: Mucous membranes are moist.   Neck:  supple no lymphadenopathy noted Cardiovascular: Normal rate, regular rhythm. Heart sounds are normal Respiratory: Normal respiratory effort.  No retractions, lungs c t a  Abd: soft tender in all quadrants, more tender in the left lower quadrant, bs normal all 4 quad GU: deferred Musculoskeletal: FROM all extremities, warm and well perfused Neurologic:  Normal speech and language.  Skin:  Skin is warm, dry and intact. No rash noted. Psychiatric: Mood and affect are normal. Speech and behavior are  normal.  ____________________________________________   LABS (all labs ordered are listed, but only abnormal results are displayed)  Labs Reviewed  COMPREHENSIVE METABOLIC PANEL - Abnormal; Notable for the following components:      Result Value   Glucose, Bld 131 (*)    All other components within normal limits  URINALYSIS, COMPLETE (UACMP) WITH MICROSCOPIC - Abnormal; Notable for the following components:   Color, Urine COLORLESS (*)    APPearance CLEAR (*)    Bacteria, UA RARE (*)    All other components within normal limits  CBC WITH DIFFERENTIAL/PLATELET   ____________________________________________   ____________________________________________  RADIOLOGY  CT abdomen/pelvis IV contrast  ____________________________________________  PROCEDURES  Procedure(s) performed: No  Procedures    ____________________________________________   INITIAL IMPRESSION / ASSESSMENT AND PLAN / ED COURSE  Pertinent labs & imaging results that were available during my care of the patient were reviewed by me and considered in my medical decision making (see chart for details).   Patient 70 year old female presents with abdominal pain and headache.  Patient's last colonoscopy was greater than 10 years ago per patient.  See HPI.  Physical exam shows patient per stable.   DDx: Diverticulitis, IBS, colitis, mass, UTI  At this time labs and imaging are ordered.  Patient be given pain medication and Catapres to lower her blood pressure.  We will give her fluids.  Will reassess after labs and imaging.  Patient was given morphine and Zofran.  Patient states she is feeling much better after medication.  Labs are reassuring, CBC metabolic panel and urinalysis are all normal  Patient's blood pressure is returned to normal at this time.  CT abdomen/pelvis IV contrast reviewed by me and confirmed by radiology to have no acute abnormality.  She does have a heavy stool burden noted in the  colon.  Did discuss this with the patient and her husband.  Encouraged her to take MiraLAX daily.  Follow-up with her regular doctor concerning her blood pressure.  Follow-up with Dr. Haig Prophet for colonoscopy.  Return emergency department if worsening.  Prescription prescription for Zofran and Bentyl.  She is discharged stable condition.  Destiny Savage was evaluated in Emergency Department on 03/07/2021 for the symptoms described in the history of present illness. She was evaluated in the context of the global COVID-19 pandemic, which necessitated consideration that the patient might be at risk for infection with the SARS-CoV-2 virus that causes COVID-19. Institutional protocols and algorithms that pertain to the evaluation of patients at risk for COVID-19 are in a state of rapid change based on information released by regulatory bodies including the CDC and federal and state organizations. These policies and algorithms were followed during the patient's care in the ED.    As part of my medical decision making, I reviewed the following data within the [place] notes reviewed and incorporated, Labs reviewed , Old chart reviewed, Radiograph reviewed , Notes from prior ED visits and  Controlled Substance Database  ____________________________________________   FINAL CLINICAL IMPRESSION(S) / ED DIAGNOSES  Final diagnoses:  Generalized abdominal pain  Bad headache      NEW MEDICATIONS STARTED DURING THIS VISIT:  New Prescriptions   DICYCLOMINE (BENTYL) 10 MG CAPSULE    Take 1 capsule (10 mg total) by mouth 4 (four) times daily for 14 days.   ONDANSETRON (ZOFRAN-ODT) 4 MG DISINTEGRATING TABLET    Take 1 tablet (4 mg total) by mouth every 8 (eight) hours as needed.     Note:  This document was prepared using Dragon voice recognition software and may include unintentional dictation errors.    Versie Starks, PA-C 03/07/21 1439    Duffy Bruce,  MD 03/11/21 [phone]

## 2021-03-07 NOTE — ED Notes (Signed)
Pt did not sign MSE e-sig due to provider meeting patient directly in the room.

## 2021-03-07 NOTE — Discharge Instructions (Addendum)
Follow-up with your regular doctor as needed Follow-up with Dr. Mia Creek for a colonoscopy Use MiraLAX daily Tylenol for pain as needed Drink plenty of water Return emergency department worsening Check your blood pressure daily.  If it continues to elevate please call your regular doctor to have them adjust your medication.

## 2021-03-14 ENCOUNTER — Other Ambulatory Visit (HOSPITAL_COMMUNITY): Payer: Self-pay | Admitting: Physician Assistant

## 2021-03-14 DIAGNOSIS — R112 Nausea with vomiting, unspecified: Secondary | ICD-10-CM

## 2021-03-19 NOTE — Progress Notes (Signed)
Cardiology Office Note  Date:  03/21/2021   ID:  Destiny Savage, DOB September 20, 1951, MRN [id number]  PCP:  Albina Billet, MD   Chief Complaint  Patient presents with  . 12 month follow up    Patient c/o falling three weeks ago and hit her shin, has soreness and pain. Medications reviewed by the patient verbally.     HPI:  Destiny Savage is a 70 y.o. female with history of  TIA/stroke,  DM2,  HTN, HLD,  long history of nausea/vomiting/diarrhea without etiology, previously seen by GI at [facility] previous symptoms of chest discomfort,  Last stress test February 2017 showing no ischemia OSA, Not using her CPAP who presents for routine follow-up of her chest pain symptoms  In follow-up today reports that she is doing very well Active at baseline, no regular exercise program Weight is trending downwards through dietary changes  Some issues with constipation,  Needs colonoscopy EGD  Denies any significant chest discomfort concerning for angina Tolerating medications  Lab work reviewed  HAB1C 7.4 Total CHOL 150  EKG personally reviewed by myself on todays visit Shows normal sinus rhythm rate 78 bpm no significant ST or T wave changes  Other past medical history reviewed Prior office visit, frequent headaches In the ER 12/16/17 and 11/24/2017  for H/A MRI normal CT head ok Was given a prescription for Fioricet  Stress at home Husband with cancer, CLL? On chemo, XRT  hospital 01/15/17,  H/A with nausea Tingling in legs CT head with no acute findings MRI head with small vessel disease MRI lumbar spine, mild DJD Carotid <49% disease  She is discharged without clear diagnosis  lost her mother November 2017  Admission to [facility] 12/24/2015 with chest pain Negative cardiac enzymes, EKG relatively unchanged Echo showed EF 60-65%, no RWMA, GR1DD, PASP 47 mm Hg, and mild to moderate posterior pericardial effusion without evidence of hemodynamic compromise.  She remained chest pain  free throughout her admission.   stress test 12/2015  no ischemia normal ejection fraction She denies any further episodes of chest pain  admitted to [facility] in 11/2001 for chest pain Dr. Clayborn Bigness performed a cardiac cath that did not show any significant coroanry disease.  She was again admitted to [facility] in 2004 for chest pain and had a nuclear stress test that did not show any ischemia.  normal echo.   PMH:   has a past medical history of Arthritis, Asthma, Diabetes mellitus, GERD (gastroesophageal reflux disease), Hyperlipidemia, Hypertension, Mini stroke ([place]), and Stroke ([place]).  PSH:    Past Surgical History:  Procedure Laterality Date  . ABDOMINAL HYSTERECTOMY    . BREAST EXCISIONAL BIOPSY Left 1980's   benign  . CARDIAC CATHETERIZATION    . CHOLECYSTECTOMY      Current Outpatient Medications  Medication Sig Dispense Refill  . albuterol (VENTOLIN HFA) 108 (90 Base) MCG/ACT inhaler Inhale 1 puff into the lungs every 6 (six) hours as needed for wheezing.    Marland Kitchen aspirin 81 MG tablet Take 1 tablet (81 mg total) by mouth daily.    Marland Kitchen atorvastatin (LIPITOR) 40 MG tablet Take 1 tablet (40 mg total) by mouth daily at 6 PM. 90 tablet 3  . Besifloxacin HCl (BESIVANCE) 0.6 % SUSP Besivance 0.6 % eye drops,suspension  INSTILL 1 DROP INTO RIGHT EYE THREE TIMES DAILY AS DIRECTED    . carvedilol (COREG) 6.25 MG tablet Take 1 tablet (6.25 mg total) by mouth 2 (two) times daily with a meal. 180 tablet  3  . cholecalciferol (VITAMIN D3) 25 MCG (1000 UNIT) tablet Take 1,000 Units by mouth daily.    . clopidogrel (PLAVIX) 75 MG tablet Take 1 tablet (75 mg total) by mouth daily. 90 tablet 3  . dicyclomine (BENTYL) 10 MG capsule Take 1 capsule (10 mg total) by mouth 4 (four) times daily for 14 days. 56 capsule 0  . furosemide (LASIX) 20 MG tablet Take 1 tablet by mouth once daily 90 tablet 0  . metFORMIN (GLUCOPHAGE-XR) 500 MG 24 hr tablet Take 1,000 mg by mouth daily.    . nortriptyline (PAMELOR) 10 MG  capsule Take 1 capsule (10 mg total) by mouth at bedtime. 30 capsule 3  . omeprazole (PRILOSEC) 20 MG capsule Take 20 mg by mouth daily.    . ondansetron (ZOFRAN-ODT) 4 MG disintegrating tablet Take 1 tablet (4 mg total) by mouth every 8 (eight) hours as needed. 20 tablet 0  . potassium chloride (KLOR-CON) 10 MEQ tablet Take 1 tablet (10 mEq total) by mouth 2 (two) times daily. 180 tablet 3  . triamcinolone cream (KENALOG) 0.5 % Apply 1 application topically 2 (two) times daily.    . diazepam (VALIUM) 5 MG tablet Take 1 tablet 30 minutes prior to MRI (Patient not taking: Reported on 03/21/2021) 1 tablet 0   No current facility-administered medications for this visit.     Allergies:   Latex, Lidocaine, and Phenobarbital-belladonna alk   Social History:  The patient  reports that she has never smoked. She has never used smokeless tobacco. She reports that she does not drink alcohol and does not use drugs.   Family History:   family history includes CAD in an other family member; Heart Problems in her father; Hypertension in an other family member.    Review of Systems: Review of Systems  Constitutional: Positive for malaise/fatigue.  HENT: Negative.   Respiratory: Negative.   Cardiovascular: Negative.   Gastrointestinal: Negative.   Musculoskeletal: Negative.   Neurological: Negative.   Psychiatric/Behavioral: Negative.   All other systems reviewed and are negative.   PHYSICAL EXAM: VS:  BP 120/70 (BP Location: Left Arm, Patient Position: Sitting, Cuff Size: Normal)   Pulse 78   Ht '5\' 6"'  (1.676 m)   Wt 212 lb 2 oz (96.2 kg)   SpO2 98%   BMI 34.24 kg/m  , BMI Body mass index is 34.24 kg/m. Constitutional:  oriented to person, place, and time. No distress.  HENT:  Head: Grossly normal Eyes:  no discharge. No scleral icterus.  Neck: No JVD, no carotid bruits  Cardiovascular: Regular rate and rhythm, no murmurs appreciated Pulmonary/Chest: Clear to auscultation bilaterally, no  wheezes or rails Abdominal: Soft.  no distension.  no tenderness.  Musculoskeletal: Normal range of motion Neurological:  normal muscle tone. Coordination normal. No atrophy Skin: Skin warm and dry Psychiatric: normal affect, pleasant  Recent Labs: 03/07/2021: ALT 11; BUN 11; Creatinine, Ser 0.68; Hemoglobin 12.0; Platelets 255; Potassium 3.9; Sodium 137    Lipid Panel Lab Results  Component Value Date   CHOL 152 10/04/2019   HDL 60 10/04/2019   LDLCALC 76 10/04/2019   TRIG 86 10/04/2019    Wt Readings from Last 3 Encounters:  03/21/21 212 lb 2 oz (96.2 kg)  03/07/21 213 lb (96.6 kg)  08/28/20 222 lb 9.6 oz (101 kg)     ASSESSMENT AND PLAN:  Essential hypertension -  Blood pressure stable, no changes to medications  Mixed hyperlipidemia - Cholesterol at goal, no changes to medications  Cerebrovascular accident (CVA), unspecified mechanism ([place])  Long history of headaches, stable previous MRI with no clear finding of stroke Headaches improved  Controlled type 2 diabetes mellitus without complication, without long-term current use of insulin (HCC) - Weight slowly trending downward, discussed dietary changes   Total encounter time more than 25 minutes  Greater than 50% was spent in counseling and coordination of care with the patient    No orders of the defined types were placed in this encounter.    Signed, Esmond Plants, M.D., Ph.D. 03/21/2021  [facility] [place], [place] [phone]

## 2021-03-20 ENCOUNTER — Ambulatory Visit: Payer: Medicare Other

## 2021-03-21 ENCOUNTER — Ambulatory Visit (INDEPENDENT_AMBULATORY_CARE_PROVIDER_SITE_OTHER): Payer: Medicare Other | Admitting: Cardiovascular Disease

## 2021-03-21 ENCOUNTER — Encounter: Payer: Self-pay | Admitting: Cardiovascular Disease

## 2021-03-21 ENCOUNTER — Other Ambulatory Visit: Payer: Self-pay

## 2021-03-21 VITALS — BP 120/70 | HR 78 | Ht 66.0 in | Wt 212.1 lb

## 2021-03-21 DIAGNOSIS — I10 Essential (primary) hypertension: Secondary | ICD-10-CM

## 2021-03-21 DIAGNOSIS — E118 Type 2 diabetes mellitus with unspecified complications: Secondary | ICD-10-CM | POA: Diagnosis not present

## 2021-03-21 DIAGNOSIS — R079 Chest pain, unspecified: Secondary | ICD-10-CM

## 2021-03-21 DIAGNOSIS — E782 Mixed hyperlipidemia: Secondary | ICD-10-CM

## 2021-03-21 DIAGNOSIS — Z8673 Personal history of transient ischemic attack (TIA), and cerebral infarction without residual deficits: Secondary | ICD-10-CM

## 2021-03-21 DIAGNOSIS — G4730 Sleep apnea, unspecified: Secondary | ICD-10-CM

## 2021-03-21 NOTE — Patient Instructions (Addendum)
Labs for Dr. Arlana Pouch  Medication Instructions:  No changes  If you need a refill on your cardiac medications before your next appointment, please call your pharmacy.    Lab work: No new labs needed   If you have labs (blood work) drawn today and your tests are completely normal, you will receive your results only by: Marland Kitchen MyChart Message (if you have MyChart) OR . A paper copy in the mail If you have any lab test that is abnormal or we need to change your treatment, we will call you to review the results.   Testing/Procedures: No new testing needed   Follow-Up: At [facility], you and your health needs are our priority.  As part of our continuing mission to provide you with exceptional heart care, we have created designated Provider Care Teams.  These Care Teams include your primary Cardiologist (physician) and Advanced Practice Providers (APPs -  Physician Assistants and Nurse Practitioners) who all work together to provide you with the care you need, when you need it.  . You will need a follow up appointment in 12 months  . Providers on your designated Care Team:   . Nicolasa Ducking, NP . Eula Listen, PA-C . Marisue Ivan, PA-C  Any Other Special Instructions Will Be Listed Below (If Applicable).  COVID-19 Vaccine Information can be found at: [email or web address] For questions related to vaccine distribution or appointments, please email [email or web address] or call [phone].

## 2021-03-29 ENCOUNTER — Other Ambulatory Visit: Payer: Self-pay

## 2021-03-29 ENCOUNTER — Ambulatory Visit (HOSPITAL_COMMUNITY)
Admission: RE | Admit: 2021-03-29 | Discharge: 2021-03-29 | Disposition: A | Discharge status: Home / Self Care | Payer: Medicare Other | Source: Ambulatory Visit | Attending: Physician Assistant | Admitting: Physician Assistant

## 2021-03-29 DIAGNOSIS — R112 Nausea with vomiting, unspecified: Secondary | ICD-10-CM | POA: Insufficient documentation

## 2021-03-29 MED ORDER — TECHNETIUM TC 99M SULFUR COLLOID
2.0000 | Freq: Once | INTRAVENOUS | Status: AC | PRN
  Administered 2021-03-29: 2 via INTRAVENOUS

## 2021-04-01 ENCOUNTER — Other Ambulatory Visit: Payer: Self-pay | Admitting: Cardiovascular Disease

## 2021-04-02 NOTE — Telephone Encounter (Signed)
Rx request sent to pharmacy.  

## 2021-04-06 ENCOUNTER — Emergency Department: Payer: Medicare Other

## 2021-04-06 ENCOUNTER — Emergency Department
Admission: EM | Admit: 2021-04-06 | Discharge: 2021-04-06 | Disposition: A | Discharge status: Home / Self Care | Payer: Medicare Other | Attending: Emergency Medicine | Admitting: Emergency Medicine

## 2021-04-06 ENCOUNTER — Other Ambulatory Visit: Payer: Self-pay

## 2021-04-06 ENCOUNTER — Encounter: Payer: Self-pay | Admitting: Emergency Medicine

## 2021-04-06 DIAGNOSIS — Z7982 Long term (current) use of aspirin: Secondary | ICD-10-CM | POA: Diagnosis not present

## 2021-04-06 DIAGNOSIS — K5900 Constipation, unspecified: Secondary | ICD-10-CM | POA: Insufficient documentation

## 2021-04-06 DIAGNOSIS — E1169 Type 2 diabetes mellitus with other specified complication: Secondary | ICD-10-CM | POA: Diagnosis not present

## 2021-04-06 DIAGNOSIS — Z91040 Latex allergy status: Secondary | ICD-10-CM | POA: Insufficient documentation

## 2021-04-06 DIAGNOSIS — Z7984 Long term (current) use of oral hypoglycemic drugs: Secondary | ICD-10-CM | POA: Diagnosis not present

## 2021-04-06 DIAGNOSIS — E782 Mixed hyperlipidemia: Secondary | ICD-10-CM | POA: Insufficient documentation

## 2021-04-06 DIAGNOSIS — I10 Essential (primary) hypertension: Secondary | ICD-10-CM | POA: Insufficient documentation

## 2021-04-06 DIAGNOSIS — R109 Unspecified abdominal pain: Secondary | ICD-10-CM | POA: Diagnosis present

## 2021-04-06 DIAGNOSIS — Z79899 Other long term (current) drug therapy: Secondary | ICD-10-CM | POA: Insufficient documentation

## 2021-04-06 DIAGNOSIS — J45909 Unspecified asthma, uncomplicated: Secondary | ICD-10-CM | POA: Insufficient documentation

## 2021-04-06 DIAGNOSIS — Z7902 Long term (current) use of antithrombotics/antiplatelets: Secondary | ICD-10-CM | POA: Insufficient documentation

## 2021-04-06 LAB — COMPREHENSIVE METABOLIC PANEL
ALT: 9 U/L (ref 0–44)
AST: 19 U/L (ref 15–41)
Albumin: 4.2 g/dL (ref 3.5–5.0)
Alkaline Phosphatase: 65 U/L (ref 38–126)
Anion gap: 10 (ref 5–15)
BUN: 8 mg/dL (ref 8–23)
CO2: 25 mmol/L (ref 22–32)
Calcium: 9.4 mg/dL (ref 8.9–10.3)
Chloride: 102 mmol/L (ref 98–111)
Creatinine, Ser: 0.71 mg/dL (ref 0.44–1.00)
GFR, Estimated: >60 mL/min (ref >60)
Glucose, Bld: 132 mg/dL — ABNORMAL HIGH (ref 70–99)
Potassium: 3.8 mmol/L (ref 3.5–5.1)
Sodium: 137 mmol/L (ref 135–145)
Total Bilirubin: 0.6 mg/dL (ref 0.3–1.2)
Total Protein: 6.9 g/dL (ref 6.5–8.1)

## 2021-04-06 LAB — CBC
HCT: 34.9 % — ABNORMAL LOW (ref 36.0–46.0)
Hemoglobin: 11.6 g/dL — ABNORMAL LOW (ref 12.0–15.0)
MCH: 30.3 pg (ref 26.0–34.0)
MCHC: 33.2 g/dL (ref 30.0–36.0)
MCV: 91.1 fL (ref 80.0–100.0)
Platelets: 259 10*3/uL (ref 150–400)
RBC: 3.83 MIL/uL — ABNORMAL LOW (ref 3.87–5.11)
RDW: 12.8 % (ref 11.5–15.5)
WBC: 7.7 10*3/uL (ref 4.0–10.5)
nRBC: 0 % (ref 0.0–0.2)

## 2021-04-06 LAB — URINALYSIS, COMPLETE (UACMP) WITH MICROSCOPIC
Bacteria, UA: NONE SEEN
Bilirubin Urine: NEGATIVE
Glucose, UA: NEGATIVE mg/dL
Ketones, ur: NEGATIVE mg/dL
Leukocytes,Ua: NEGATIVE
Nitrite: NEGATIVE
Protein, ur: NEGATIVE mg/dL
Specific Gravity, Urine: 1.002 — ABNORMAL LOW (ref 1.005–1.030)
Squamous Epithelial / HPF: NONE SEEN (ref 0–5)
pH: 5 (ref 5.0–8.0)

## 2021-04-06 LAB — LIPASE, BLOOD: Lipase: 73 U/L — ABNORMAL HIGH (ref 11–51)

## 2021-04-06 MED ORDER — POLYETHYLENE GLYCOL 3350 17 G PO PACK: 17.0000 g | PACK | Freq: Every day | ORAL | 0 refills | Status: DC

## 2021-04-06 MED ORDER — ONDANSETRON HCL 4 MG/2ML IJ SOLN
INTRAMUSCULAR | Status: AC
  Administered 2021-04-06: 4 mg via INTRAVENOUS
  Filled 2021-04-06: qty 2

## 2021-04-06 MED ORDER — ONDANSETRON HCL 4 MG/2ML IJ SOLN
4.0000 mg | Freq: Once | INTRAMUSCULAR | Status: AC
  Administered 2021-04-06: 4 mg via INTRAVENOUS
  Filled 2021-04-06: qty 2

## 2021-04-06 MED ORDER — SORBITOL 70 % SOLN
960.0000 mL | TOPICAL_OIL | Freq: Once | ORAL | Status: AC
  Administered 2021-04-06: 960 mL via RECTAL
  Filled 2021-04-06: qty 240

## 2021-04-06 MED ORDER — SODIUM CHLORIDE 0.9 % IV BOLUS
1000.0000 mL | Freq: Once | INTRAVENOUS | Status: AC
  Administered 2021-04-06: 1000 mL via INTRAVENOUS

## 2021-04-06 MED ORDER — MORPHINE SULFATE (PF) 4 MG/ML IV SOLN
4.0000 mg | Freq: Once | INTRAVENOUS | Status: AC
  Administered 2021-04-06: 4 mg via INTRAVENOUS
  Filled 2021-04-06: qty 1

## 2021-04-06 NOTE — ED Provider Notes (Signed)
Patient with fairly large bowel movement after enema. Did continue to complain of some abdominal pain. Discussed CT scan findings with the patient. Patient states she has had similar pain on and off for years. Patient also has not taken her evening doses of high blood pressure medications, likely leading to some hypertension (this could also be driven by pain.). Will give pain medication here. Will plan on discharging afterwards.    Phineas Semen, MD 04/06/21 2256

## 2021-04-06 NOTE — ED Provider Notes (Signed)
[facility] Emergency Department Provider Note ____________________________________________   Event Date/Time   First MD Initiated Contact with Patient 04/06/21 1800     (approximate)  I have reviewed the triage vital signs and the nursing notes.   HISTORY  Chief Complaint Constipation and Abdominal Pain  HPI Destiny Savage is a 70 y.o. female with history of diabetes, hypertension, CVA, and IBS presents to the emergency department for treatment and evaluation of constipation.  She states that her last normal bowel movement was approximately 2 weeks ago.  She states that about 5 days ago she had a very small, hard stool.  She currently does not feel that her urge.  She is nauseated but has not vomited.  She feels bloated and has tightness in her stomach.  No previous similar symptoms.  No alleviating measures attempted prior to arrival..         Past Medical History:  Diagnosis Date  . Arthritis   . Asthma   . Diabetes mellitus   . GERD (gastroesophageal reflux disease)   . Hyperlipidemia   . Hypertension   . Mini stroke ([place])   . Stroke [facility])     Patient Active Problem List   Diagnosis Date Noted  . Stroke (cerebrum) ([place]) 01/07/2019  . 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 Stroke ([place]) 01/10/2017  . Mixed hyperlipidemia 11/21/2016  . Neck pain 11/21/2016  . Chest pain 12/23/2015  . GERD (gastroesophageal reflux disease) 12/23/2015  . Abdominal pain 04/30/2013  . Vertigo 05/11/2012  . Hypertension 05/11/2012  . Diabetes type 2, controlled ([place]) 05/11/2012  . Neck pain on right side 05/11/2012    Past Surgical History:  Procedure Laterality Date  . ABDOMINAL HYSTERECTOMY    . BREAST EXCISIONAL BIOPSY Left 1980's   benign  . CARDIAC CATHETERIZATION    . CHOLECYSTECTOMY      Prior to Admission medications   Medication Sig Start Date End Date Taking? Authorizing Provider  albuterol (VENTOLIN HFA) 108 (90 Base) MCG/ACT  inhaler Inhale 1 puff into the lungs every 6 (six) hours as needed for wheezing.    [provider]  aspirin 81 MG tablet Take 1 tablet (81 mg total) by mouth daily. 01/11/17   Hillary Bow, MD  atorvastatin (LIPITOR) 40 MG tablet TAKE 1 TABLET BY MOUTH ONCE DAILY AT  6PM 04/02/21   Minna Merritts, MD  Besifloxacin HCl (BESIVANCE) 0.6 % SUSP Besivance 0.6 % eye drops,suspension  INSTILL 1 DROP INTO RIGHT EYE THREE TIMES DAILY AS DIRECTED    [provider]  carvedilol (COREG) 6.25 MG tablet Take 1 tablet (6.25 mg total) by mouth 2 (two) times daily with a meal. 03/21/20   Gollan, Kathlene November, MD  cholecalciferol (VITAMIN D3) 25 MCG (1000 UNIT) tablet Take 1,000 Units by mouth daily.    [provider]  clopidogrel (PLAVIX) 75 MG tablet Take 1 tablet (75 mg total) by mouth daily. 03/21/20   Minna Merritts, MD  diazepam (VALIUM) 5 MG tablet Take 1 tablet 30 minutes prior to MRI Patient not taking: Reported on 03/21/2021 06/30/19   Pieter Partridge, DO  dicyclomine (BENTYL) 10 MG capsule Take 1 capsule (10 mg total) by mouth 4 (four) times daily for 14 days. 03/07/21 03/21/21  Fisher, Linden Dolin, PA-C  furosemide (LASIX) 20 MG tablet Take 1 tablet by mouth once daily 12/25/20   Minna Merritts, MD  metFORMIN (GLUCOPHAGE-XR) 500 MG 24 hr tablet Take 1,000 mg by mouth  daily. 11/23/19   [provider]  nortriptyline (PAMELOR) 10 MG capsule Take 1 capsule (10 mg total) by mouth at bedtime. 06/25/19   Pieter Partridge, DO  omeprazole (PRILOSEC) 20 MG capsule Take 20 mg by mouth daily.    [provider]  ondansetron (ZOFRAN-ODT) 4 MG disintegrating tablet Take 1 tablet (4 mg total) by mouth every 8 (eight) hours as needed. 03/07/21   Fisher, Linden Dolin, PA-C  potassium chloride (KLOR-CON) 10 MEQ tablet Take 1 tablet by mouth twice daily 04/02/21   Minna Merritts, MD  triamcinolone cream (KENALOG) 0.5 % Apply 1 application topically 2 (two) times daily. 07/13/18   [provider]    Allergies Latex, Lidocaine, and Phenobarbital-belladonna alk  Family History  Problem Relation Age of Onset  . Heart Problems Father   . Hypertension Other   . CAD Other   . Breast cancer Neg Hx     Social History Social History   Tobacco Use  . Smoking status: Never Smoker  . Smokeless tobacco: Never Used  Vaping Use  . Vaping Use: Never used  Substance Use Topics  . Alcohol use: No  . Drug use: No    Review of Systems  Constitutional: No fever/chills Eyes: No visual changes. ENT: No sore throat. Cardiovascular: Denies chest pain. Respiratory: Denies shortness of breath. Gastrointestinal: Positive for abdominal distention, nausea, constipation.  Negative for vomiting Genitourinary: Negative for dysuria. Musculoskeletal: Negative for back pain. Skin: Negative for rash. Neurological: Negative for headaches, focal weakness or numbness. ____________________________________________   PHYSICAL EXAM:  VITAL SIGNS: ED Triage Vitals  Enc Vitals Group     BP 04/06/21 1441 (!) 159/88     Pulse Rate 04/06/21 1441 77     Resp 04/06/21 1441 18     Temp 04/06/21 1441 98.4 F (36.9 C)     Temp Source 04/06/21 1441 Oral     SpO2 04/06/21 1441 98 %     Weight 04/06/21 1442 212 lb 2 oz (96.2 kg)     Height 04/06/21 1442 $RemoveBefor'5\' 6"'[id number]$  (1.676 m)     Head Circumference --      Peak Flow --      Pain Score 04/06/21 1441 10     Pain Loc --      Pain Edu? --      Excl. in [place]? --     Constitutional: Alert and oriented. Well appearing and in no acute distress. Eyes: Conjunctivae are normal.  Head: Atraumatic. Nose: No congestion/rhinnorhea. Mouth/Throat: Mucous membranes are moist.  Oropharynx non-erythematous. Neck: No stridor.   Hematological/Lymphatic/Immunilogical: No cervical lymphadenopathy. Cardiovascular: Normal rate, regular rhythm. Grossly normal heart sounds.  Good peripheral circulation. Respiratory: Normal respiratory effort.  No retractions. Lungs  CTAB. Gastrointestinal: Soft and nontender.  Distended. No abdominal bruits.  Genitourinary:  Musculoskeletal: No lower extremity tenderness nor edema.  No joint effusions. Neurologic:  Normal speech and language. No gross focal neurologic deficits are appreciated. No gait instability. Skin:  Skin is warm, dry and intact. No rash noted. Psychiatric: Mood and affect are normal. Speech and behavior are normal.  ____________________________________________   LABS (all labs ordered are listed, but only abnormal results are displayed)  Labs Reviewed  LIPASE, BLOOD - Abnormal; Notable for the following components:      Result Value   Lipase 73 (*)    All other components within normal limits  COMPREHENSIVE METABOLIC PANEL - Abnormal; Notable for the following components:   Glucose, Bld 132 (*)  All other components within normal limits  CBC - Abnormal; Notable for the following components:   RBC 3.83 (*)    Hemoglobin 11.6 (*)    HCT 34.9 (*)    All other components within normal limits  URINALYSIS, COMPLETE (UACMP) WITH MICROSCOPIC - Abnormal; Notable for the following components:   Color, Urine STRAW (*)    APPearance CLEAR (*)    Specific Gravity, Urine 1.002 (*)    Hgb urine dipstick SMALL (*)    All other components within normal limits   ____________________________________________  EKG  Not indicated ____________________________________________  RADIOLOGY  ED MD interpretation:    CT of the abdomen and pelvis without contrast shows constipation throughout the colon without evidence of ileus or small bowel obstruction.  I, Sherrie George, personally viewed and evaluated these images (plain radiographs) as part of my medical decision making, as well as reviewing the written report by the radiologist.  Official radiology report(s): CT ABDOMEN PELVIS WO CONTRAST  Result Date: 04/06/2021 CLINICAL DATA:  Acute generalized abdominal pain.  Constipation. EXAM: CT ABDOMEN  AND PELVIS WITHOUT CONTRAST TECHNIQUE: Multidetector CT imaging of the abdomen and pelvis was performed following the standard protocol without IV contrast. COMPARISON:  March 07, 2021. FINDINGS: Lower chest: No acute abnormality. Hepatobiliary: No focal liver abnormality is seen. Status post cholecystectomy. No biliary dilatation. Pancreas: Unremarkable. No pancreatic ductal dilatation or surrounding inflammatory changes. Spleen: Normal in size without focal abnormality. Adrenals/Urinary Tract: Adrenal glands are unremarkable. Kidneys are normal, without renal calculi, focal lesion, or hydronephrosis. Bladder is unremarkable. Stomach/Bowel: Stomach is within normal limits. Appendix appears normal. No evidence of bowel wall thickening, distention, or inflammatory changes. Stool is noted throughout the colon suggesting constipation. Vascular/Lymphatic: No significant vascular findings are present. No enlarged abdominal or pelvic lymph nodes. Reproductive: Status post hysterectomy. No adnexal masses. Other: No abdominal wall hernia or abnormality. No abdominopelvic ascites. Musculoskeletal: No acute or significant osseous findings. IMPRESSION: Stool is noted throughout the colon suggesting constipation. No other abnormality seen in the abdomen or pelvis. Electronically Signed   By: Marijo Conception M.D.   On: 04/06/2021 19:12    ____________________________________________   PROCEDURES  Procedure(s) performed (including Critical Care):  Procedures  ____________________________________________   INITIAL IMPRESSION / ASSESSMENT AND PLAN     70 year old female presenting to the emergency department for treatment and evaluation of constipation.  See HPI for further details.  Will review labs drawn per protocol while awaiting ER room assignment.  We will also order CT of the abdomen pelvis without contrast to evaluate for ileus or small bowel obstruction. (Radiologist was consulted regarding CT with or  without contrast as we are in a national contrast shortage. CT without was recommended.)  DIFFERENTIAL DIAGNOSIS  Constipation, ileus, small bowel obstruction, IBS  ED COURSE  CT shows no indication of ileus or small bowel obstruction.  There is a large degree of constipation.  Results were discussed with the patient.  Smog enema has been ordered.   ----------------------------------------- 8:02 PM on 04/06/2021 ----------------------------------------- Patient care relinquished to Dr. Archie Balboa.     ___________________________________________   FINAL CLINICAL IMPRESSION(S) / ED DIAGNOSES  Final diagnoses:  Constipation, unspecified constipation type     ED Discharge Orders    None       Destiny Savage was evaluated in Emergency Department on 04/06/2021 for the symptoms described in the history of present illness. She was evaluated in the context of the global COVID-19 pandemic, which necessitated consideration that the  patient might be at risk for infection with the SARS-CoV-2 virus that causes COVID-19. Institutional protocols and algorithms that pertain to the evaluation of patients at risk for COVID-19 are in a state of rapid change based on information released by regulatory bodies including the CDC and federal and state organizations. These policies and algorithms were followed during the patient's care in the ED.   Note:  This document was prepared using Dragon voice recognition software and may include unintentional dictation errors.   Victorino Dike, FNP 04/06/21 2002    Nance Pear, MD 04/06/21 2154

## 2021-04-06 NOTE — ED Triage Notes (Addendum)
Pt to ED via POV with c/o constipation x 5 days, states has been trying x 4 days with OTC stool softeners to have a BM without relief.  Pt states was referred by PCP to ED.   Pt also c/o lower abdominal pain from not being able to have a BM, hx of IBS.

## 2021-04-06 NOTE — ED Notes (Signed)
Pt to CT

## 2021-04-06 NOTE — Discharge Instructions (Signed)
Take MiraLax 2 times per day until you are having regular bowel movements.  Follow up with either GI or your primary care provider if symptoms return.  Come back to the ER if your abdominal pain and nausea get worse.

## 2021-04-13 ENCOUNTER — Emergency Department (HOSPITAL_COMMUNITY)
Admission: EM | Admit: 2021-04-13 | Discharge: 2021-04-14 | Disposition: A | Discharge status: Home / Self Care | Payer: Medicare Other | Attending: Emergency Medicine | Admitting: Emergency Medicine

## 2021-04-13 ENCOUNTER — Encounter (HOSPITAL_COMMUNITY): Payer: Self-pay | Admitting: *Deleted

## 2021-04-13 ENCOUNTER — Emergency Department (HOSPITAL_COMMUNITY): Payer: Medicare Other

## 2021-04-13 ENCOUNTER — Other Ambulatory Visit: Payer: Self-pay

## 2021-04-13 DIAGNOSIS — K5900 Constipation, unspecified: Secondary | ICD-10-CM | POA: Diagnosis not present

## 2021-04-13 DIAGNOSIS — Z91040 Latex allergy status: Secondary | ICD-10-CM | POA: Diagnosis not present

## 2021-04-13 DIAGNOSIS — R1084 Generalized abdominal pain: Secondary | ICD-10-CM | POA: Diagnosis present

## 2021-04-13 DIAGNOSIS — K219 Gastro-esophageal reflux disease without esophagitis: Secondary | ICD-10-CM | POA: Diagnosis not present

## 2021-04-13 DIAGNOSIS — E119 Type 2 diabetes mellitus without complications: Secondary | ICD-10-CM | POA: Diagnosis not present

## 2021-04-13 DIAGNOSIS — J45909 Unspecified asthma, uncomplicated: Secondary | ICD-10-CM | POA: Insufficient documentation

## 2021-04-13 DIAGNOSIS — I10 Essential (primary) hypertension: Secondary | ICD-10-CM | POA: Diagnosis not present

## 2021-04-13 DIAGNOSIS — Z79899 Other long term (current) drug therapy: Secondary | ICD-10-CM | POA: Insufficient documentation

## 2021-04-13 DIAGNOSIS — Z7982 Long term (current) use of aspirin: Secondary | ICD-10-CM | POA: Diagnosis not present

## 2021-04-13 DIAGNOSIS — Z7984 Long term (current) use of oral hypoglycemic drugs: Secondary | ICD-10-CM | POA: Diagnosis not present

## 2021-04-13 LAB — CBC WITH DIFFERENTIAL/PLATELET
Abs Immature Granulocytes: 0.02 K/uL (ref 0.00–0.07)
Basophils Absolute: 0.1 K/uL (ref 0.0–0.1)
Basophils Relative: 1 %
Eosinophils Absolute: 0.4 K/uL (ref 0.0–0.5)
Eosinophils Relative: 4 %
HCT: 37.2 % (ref 36.0–46.0)
Hemoglobin: 12 g/dL (ref 12.0–15.0)
Immature Granulocytes: 0 %
Lymphocytes Relative: 27 %
Lymphs Abs: 2.4 K/uL (ref 0.7–4.0)
MCH: 30 pg (ref 26.0–34.0)
MCHC: 32.3 g/dL (ref 30.0–36.0)
MCV: 93 fL (ref 80.0–100.0)
Monocytes Absolute: 0.5 K/uL (ref 0.1–1.0)
Monocytes Relative: 6 %
Neutro Abs: 5.4 K/uL (ref 1.7–7.7)
Neutrophils Relative %: 62 %
Platelets: 282 K/uL (ref 150–400)
RBC: 4 MIL/uL (ref 3.87–5.11)
RDW: 12.8 % (ref 11.5–15.5)
WBC: 8.8 K/uL (ref 4.0–10.5)
nRBC: 0 % (ref 0.0–0.2)

## 2021-04-13 LAB — COMPREHENSIVE METABOLIC PANEL
ALT: 10 U/L (ref 0–44)
AST: 18 U/L (ref 15–41)
Albumin: 3.9 g/dL (ref 3.5–5.0)
Alkaline Phosphatase: 59 U/L (ref 38–126)
Anion gap: 11 (ref 5–15)
BUN: 5 mg/dL — ABNORMAL LOW (ref 8–23)
CO2: 24 mmol/L (ref 22–32)
Calcium: 9.7 mg/dL (ref 8.9–10.3)
Chloride: 103 mmol/L (ref 98–111)
Creatinine, Ser: 0.79 mg/dL (ref 0.44–1.00)
GFR, Estimated: >60 mL/min (ref >60)
Glucose, Bld: 143 mg/dL — ABNORMAL HIGH (ref 70–99)
Potassium: 3.6 mmol/L (ref 3.5–5.1)
Sodium: 138 mmol/L (ref 135–145)
Total Bilirubin: 0.7 mg/dL (ref 0.3–1.2)
Total Protein: 6.7 g/dL (ref 6.5–8.1)

## 2021-04-13 LAB — URINALYSIS, ROUTINE W REFLEX MICROSCOPIC
Bacteria, UA: NONE SEEN
Bilirubin Urine: NEGATIVE
Glucose, UA: NEGATIVE mg/dL
Ketones, ur: NEGATIVE mg/dL
Leukocytes,Ua: NEGATIVE
Nitrite: NEGATIVE
Protein, ur: NEGATIVE mg/dL
Specific Gravity, Urine: 1.004 — ABNORMAL LOW (ref 1.005–1.030)
pH: 5 (ref 5.0–8.0)

## 2021-04-13 NOTE — ED Provider Notes (Signed)
Emergency Medicine Provider Triage Evaluation Note  Destiny Savage , a 70 y.o. female  was evaluated in triage.  Pt complains of left lower quadrant abdominal pain.  Initially seen 1 week ago at [facility].  She had CT scan at that time which showed constipation.  Patient states at that time she had generalized abdominal pain however this is now located to her left lower quadrant.  Her last colonoscopy was greater than 10 years ago.  She states yesterday she had a very loose stool.  She is unsure if she had blood in this.  She denies any prior history of diverticulitis.  He feels nauseous or has not vomited.  She is not passing flatulence today.  States pain today does not feel similar to the pain she had 1 week ago.  Review of Systems  Positive: Left lower quadrant abdominal pain Negative: Emesis  Physical Exam  There were no vitals taken for this visit. Gen:   Awake, no distress  Resp:  Normal effort  MSK:   Moves extremities without difficulty  ABD:  Soft, diffuse tenderness to left lower quadrant. Other:    Medical Decision Making  Medically screening exam initiated at 5:38 PM.  Appropriate orders placed.  Destiny Savage was informed that the remainder of the evaluation will be completed by another provider, this initial triage assessment does not replace that evaluation, and the importance of remaining in the ED until their evaluation is complete.  Left lower quadrant abdominal pain     Jordell Outten A, PA-C 04/13/21 1743    Sabino Donovan, MD 04/13/21 [phone]

## 2021-04-13 NOTE — ED Triage Notes (Signed)
Pt c.o abd pain for months and irregular bms.  She had a small amount of diarrhea today

## 2021-04-14 MED ORDER — PEG 3350-KCL-NABCB-NACL-NASULF 236 G PO SOLR: ORAL | 0 refills | Status: DC

## 2021-04-14 NOTE — ED Provider Notes (Signed)
[facility] EMERGENCY DEPARTMENT Provider Note   CSN: [id number] Arrival date & time: 04/13/21  1723     History Chief Complaint  Patient presents with  . Abdominal Pain    Destiny Savage is a 70 y.o. female.  HPI     This is a 70 year old female with a history of diabetes, hypertension, hyperlipidemia, CVA who presents with abdominal discomfort and constipation.  Patient reports generalized abdominal discomfort worsened over the last week.  It is mostly in the left lower quadrant.  She states she has had inconsistent stools but did have a loose watery stool yesterday.  She has been taking MiraLAX daily.  She has been seen twice at [facility] for similar symptoms.  She did receive an enema 1 time which did help some.  She has not attempted an enema at home.  She is due for colonoscopy in August.  Patient rates her pain at 5 out of 10.  She states at times she feels nauseous when she eats.  No chest pain or shortness of breath.  No fevers.  No urinary symptoms.  Past Medical History:  Diagnosis Date  . Arthritis   . Asthma   . Diabetes mellitus   . GERD (gastroesophageal reflux disease)   . Hyperlipidemia   . Hypertension   . Mini stroke ([place])   . Stroke [facility])     Patient Active Problem List   Diagnosis Date Noted  . 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 Stroke ([place]) 01/10/2017  . Mixed hyperlipidemia 11/21/2016  . Neck pain 11/21/2016  . Chest pain 12/23/2015  . GERD (gastroesophageal reflux disease) 12/23/2015  . Abdominal pain 04/30/2013  . Vertigo 05/11/2012  . Hypertension 05/11/2012  . Diabetes type 2, controlled ([place]) 05/11/2012  . Neck pain on right side 05/11/2012    Past Surgical History:  Procedure Laterality Date  . ABDOMINAL HYSTERECTOMY    . BREAST EXCISIONAL BIOPSY Left 1980's   benign  . CARDIAC CATHETERIZATION    . CHOLECYSTECTOMY       OB History   No obstetric  history on file.     Family History  Problem Relation Age of Onset  . Heart Problems Father   . Hypertension Other   . CAD Other   . Breast cancer Neg Hx     Social History   Tobacco Use  . Smoking status: Never Smoker  . Smokeless tobacco: Never Used  Vaping Use  . Vaping Use: Never used  Substance Use Topics  . Alcohol use: No  . Drug use: No    Home Medications Prior to Admission medications   Medication Sig Start Date End Date Taking? Authorizing Provider  polyethylene glycol (GOLYTELY) 236 g solution Drink one 8 oz glass every 10 minutes until entire bottle is consumed. 04/14/21  Yes Katena Petitjean, Barbette Hair, MD  albuterol (VENTOLIN HFA) 108 (90 Base) MCG/ACT inhaler Inhale 1 puff into the lungs every 6 (six) hours as needed for wheezing.    [provider]  aspirin 81 MG tablet Take 1 tablet (81 mg total) by mouth daily. 01/11/17   Hillary Bow, MD  atorvastatin (LIPITOR) 40 MG tablet TAKE 1 TABLET BY MOUTH ONCE DAILY AT  6PM 04/02/21   Minna Merritts, MD  Besifloxacin HCl (BESIVANCE) 0.6 % SUSP Besivance 0.6 % eye drops,suspension  INSTILL 1 DROP INTO RIGHT EYE THREE TIMES DAILY AS DIRECTED    [provider]  carvedilol (COREG)  6.25 MG tablet Take 1 tablet (6.25 mg total) by mouth 2 (two) times daily with a meal. 03/21/20   Gollan, Kathlene November, MD  cholecalciferol (VITAMIN D3) 25 MCG (1000 UNIT) tablet Take 1,000 Units by mouth daily.    [provider]  clopidogrel (PLAVIX) 75 MG tablet Take 1 tablet (75 mg total) by mouth daily. 03/21/20   Minna Merritts, MD  diazepam (VALIUM) 5 MG tablet Take 1 tablet 30 minutes prior to MRI Patient not taking: Reported on 03/21/2021 06/30/19   Pieter Partridge, DO  dicyclomine (BENTYL) 10 MG capsule Take 1 capsule (10 mg total) by mouth 4 (four) times daily for 14 days. 03/07/21 03/21/21  Fisher, Linden Dolin, PA-C  furosemide (LASIX) 20 MG tablet Take 1 tablet by mouth once daily 12/25/20   Minna Merritts, MD  metFORMIN  (GLUCOPHAGE-XR) 500 MG 24 hr tablet Take 1,000 mg by mouth daily. 11/23/19   [provider]  nortriptyline (PAMELOR) 10 MG capsule Take 1 capsule (10 mg total) by mouth at bedtime. 06/25/19   Pieter Partridge, DO  omeprazole (PRILOSEC) 20 MG capsule Take 20 mg by mouth daily.    [provider]  ondansetron (ZOFRAN-ODT) 4 MG disintegrating tablet Take 1 tablet (4 mg total) by mouth every 8 (eight) hours as needed. 03/07/21   Fisher, Linden Dolin, PA-C  polyethylene glycol (MIRALAX) 17 g packet Take 17 g by mouth daily. 04/06/21   Nance Pear, MD  potassium chloride (KLOR-CON) 10 MEQ tablet Take 1 tablet by mouth twice daily 04/02/21   Minna Merritts, MD  triamcinolone cream (KENALOG) 0.5 % Apply 1 application topically 2 (two) times daily. 07/13/18   [provider]    Allergies    Latex, Lidocaine, and Phenobarbital-belladonna alk  Review of Systems   Review of Systems  Constitutional: Negative for fever.  Respiratory: Negative for shortness of breath.   Cardiovascular: Negative for chest pain.  Gastrointestinal: Positive for abdominal pain, constipation and nausea. Negative for anal bleeding and vomiting.  Genitourinary: Negative for dysuria.  All other systems reviewed and are negative.   Physical Exam Updated Vital Signs BP (!) 145/91 (BP Location: Right Arm)   Pulse 71   Temp 98.2 F (36.8 C) (Oral)   Resp 18   Ht 1.676 m ('5\' 6"' )   Wt 96.2 kg   SpO2 98%   BMI 34.23 kg/m   Physical Exam Vitals and nursing note reviewed.  Constitutional:      Appearance: She is well-developed. She is obese. She is not ill-appearing.  HENT:     Head: Normocephalic and atraumatic.  Eyes:     Pupils: Pupils are equal, round, and reactive to light.  Cardiovascular:     Rate and Rhythm: Normal rate and regular rhythm.     Heart sounds: Normal heart sounds.  Pulmonary:     Effort: Pulmonary effort is normal. No respiratory distress.     Breath sounds: No wheezing.   Abdominal:     General: Bowel sounds are normal.     Palpations: Abdomen is soft.     Tenderness: There is no abdominal tenderness. There is no guarding or rebound.  Musculoskeletal:     Cervical back: Neck supple.  Skin:    General: Skin is warm and dry.  Neurological:     Mental Status: She is alert and oriented to person, place, and time.  Psychiatric:        Mood and Affect: Mood normal.  ED Results / Procedures / Treatments   Labs (all labs ordered are listed, but only abnormal results are displayed) Labs Reviewed  COMPREHENSIVE METABOLIC PANEL - Abnormal; Notable for the following components:      Result Value   Glucose, Bld 143 (*)    BUN 5 (*)    All other components within normal limits  URINALYSIS, ROUTINE W REFLEX MICROSCOPIC - Abnormal; Notable for the following components:   Color, Urine STRAW (*)    Specific Gravity, Urine 1.004 (*)    Hgb urine dipstick SMALL (*)    All other components within normal limits  CBC WITH DIFFERENTIAL/PLATELET    EKG None  Radiology CT ABDOMEN PELVIS WO CONTRAST  Result Date: 04/13/2021 CLINICAL DATA:  Left lower quadrant pain, diarrhea for 1 month EXAM: CT ABDOMEN AND PELVIS WITHOUT CONTRAST TECHNIQUE: Multidetector CT imaging of the abdomen and pelvis was performed following the standard protocol without IV contrast. COMPARISON:  CT 04/06/2021 FINDINGS: Lower chest: Bandlike areas of atelectasis or scarring in the lung bases. Lung bases otherwise clear. Trace pericardial effusion. Coronary artery calcifications. Hepatobiliary: No visible focal liver lesion. Smooth liver surface contour. Normal hepatic attenuation. Prior cholecystectomy with clips in the gallbladder fossa. No significant biliary ductal dilatation or visible intraductal gallstones. Pancreas: No pancreatic ductal dilatation or surrounding inflammatory changes. Spleen: Normal in size. No concerning splenic lesions. Adrenals/Urinary Tract: Normal adrenal glands.  Kidneys are symmetric in size and normally located. No visible or contour deforming renal lesion. No urolithiasis or hydronephrosis. Urinary bladder is largely decompressed at the time of exam and therefore poorly evaluated by CT imaging. No gross bladder abnormality accounting for underdistention. Stomach/Bowel: Distal esophagus, stomach and duodenal sweep are unremarkable. No small bowel wall thickening or dilatation. No evidence of obstruction. A normal appendix is visualized. Moderate colonic stool burden albeit without significant colonic dilatation or mural thickening. No evidence of high-grade bowel obstruction. Vascular/Lymphatic: Minimal atherosclerotic plaque in the aorta and branch vessels. No aneurysm or ectasia. No worrisome enlarged abdominopelvic nodes are visible on this unenhanced CT. Reproductive: Uterus is surgically absent. No concerning adnexal lesions. Other: No abdominopelvic free fluid or free gas. No bowel containing hernias. Prior low vertical midline incision. Musculoskeletal: Degenerative changes are present in the spine, hips and pelvis including sub cortical cystic changes about the bilateral acetabula and femoral heads. No acute osseous abnormality or suspicious osseous lesion. Musculature is and symmetric. IMPRESSION: 1. Moderate colonic stool burden without other acute bowel abnormality. Correlate for features of slow transit/constipation. 2. No other acute CT abnormality to provide a cause for patient's left lower quadrant pain. 3. Small pericardial effusion, small effusion present on comparison imaging as well. 4. Prior cholecystectomy. 5. Aortic Atherosclerosis (ICD10-I70.0). Coronary artery atherosclerosis. Electronically Signed   By: Lovena Le M.D.   On: 04/13/2021 18:50    Procedures Procedures   Medications Ordered in ED Medications - No data to display  ED Course  I have reviewed the triage vital signs and the nursing notes.  Pertinent labs & imaging results  that were available during my care of the patient were reviewed by me and considered in my medical decision making (see chart for details).    MDM Rules/Calculators/A&P                          Patient presents with ongoing abdominal discomfort and symptoms of constipation.  She is nontoxic and vital signs are reassuring.  Work-up reviewed from triage.  No significant metabolic derangements.  No leukocytosis.  Urinalysis without evidence of UTI.  CT scan of the abdomen obtained to evaluate for diverticulitis or obstruction.  CT is largely unchanged from prior.  She continues to have significant colonic stool burden.  She has a stable small pericardial effusion.  I discussed with the patient that at this time, she likely would benefit from a full bowel prep and cleanout.  She was instructed regarding prep with GoLytely.  Encouraged her to follow-up with gastroenterology.  After history, exam, and medical workup I feel the patient has been appropriately medically screened and is safe for discharge home. Pertinent diagnoses were discussed with the patient. Patient was given return precautions.  Final Clinical Impression(s) / ED Diagnoses Final diagnoses:  Constipation, unspecified constipation type  Generalized abdominal pain    Rx / DC Orders ED Discharge Orders         Ordered    polyethylene glycol (GOLYTELY) 236 g solution        04/14/21 0434           Kayin Kettering, Barbette Hair, MD 04/14/21 [phone]

## 2021-04-14 NOTE — Discharge Instructions (Addendum)
Today for abdominal pain.  Your CT continues to be consistent with constipation.  Do the GI cleanout.  Make sure to increase fiber and fluids in your diet.  Follow-up with gastroenterology for your colonoscopy.
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
## 2021-05-25 ENCOUNTER — Telehealth: Payer: Self-pay | Admitting: Neurology

## 2021-05-25 NOTE — Telephone Encounter (Signed)
Husband is concerned with his wife's memory. Pt is in denial about it and did not want to speak of it during her next appointment. Wants Dr. Everlena Cooper to be aware he is concerned.

## 2021-05-28 ENCOUNTER — Other Ambulatory Visit: Payer: Self-pay

## 2021-05-28 ENCOUNTER — Ambulatory Visit: Payer: Medicare Other | Admitting: Neurology

## 2021-05-28 ENCOUNTER — Encounter: Payer: Self-pay | Admitting: Neurology

## 2021-05-28 VITALS — BP 140/81 | HR 77 | Ht 66.0 in | Wt 209.5 lb

## 2021-05-28 DIAGNOSIS — G43009 Migraine without aura, not intractable, without status migrainosus: Secondary | ICD-10-CM | POA: Diagnosis not present

## 2021-05-28 MED ORDER — NORTRIPTYLINE HCL 10 MG PO CAPS: 10.0000 mg | ORAL_CAPSULE | Freq: Every day | ORAL | 5 refills | Status: DC

## 2021-05-28 NOTE — Patient Instructions (Addendum)
Continue nortriptyline 10mg  at bedtime May use acetaminophen as needed but limit use to no more than 2 days out of week to prevent risk of rebound or medication-overuse headache. Use CPAP Follow up 9 months

## 2021-07-05 ENCOUNTER — Other Ambulatory Visit: Payer: Self-pay | Admitting: Cardiovascular Disease

## 2021-08-08 IMAGING — CT CT ABD-PELV W/O CM
2 of 4 series · 17 of 46 positions shown, 19 images · non-contrast
Comparison: March 07, 2021.

CLINICAL DATA: Acute generalized abdominal pain.  Constipation.

EXAM:
CT ABDOMEN AND PELVIS WITHOUT CONTRAST
TECHNIQUE: Multidetector CT imaging of the abdomen and pelvis was performed
following the standard protocol without IV contrast.

[Series 2: routine abd/pel wo · axial · 0.90mm/px · z∈[-358,+42]mm · 14 of 88 slices shown, 16 images]
[im 4/88  soft-tissue]
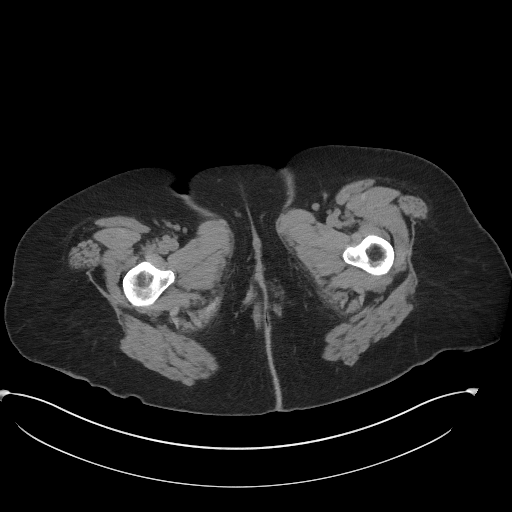
[im 4/88  bone]
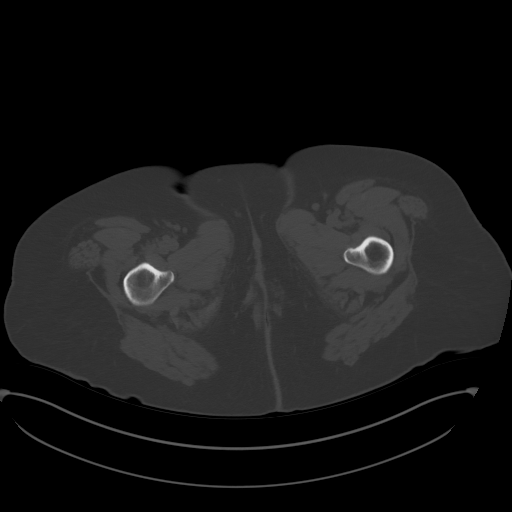
[im 11/88  soft-tissue]
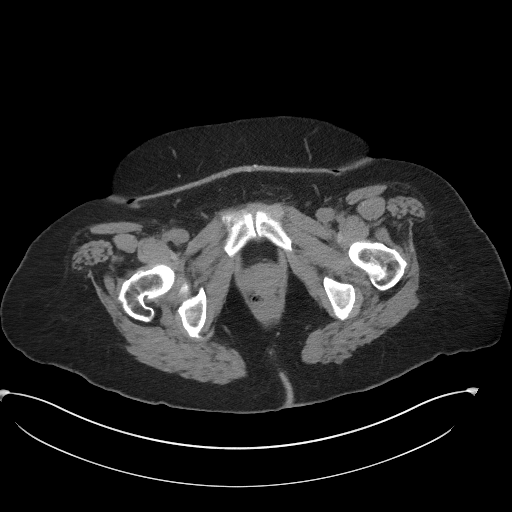
[im 17/88  soft-tissue]
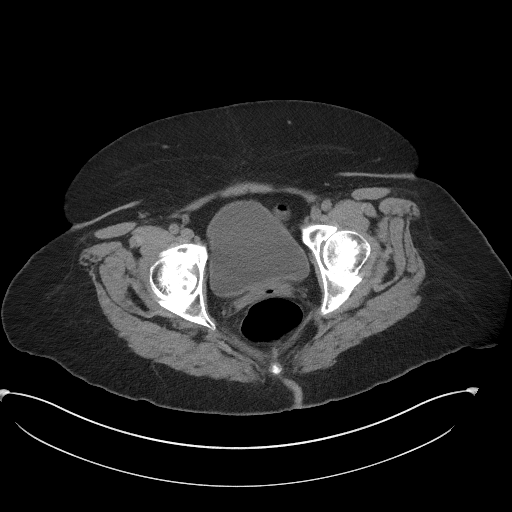
[im 24/88  soft-tissue]
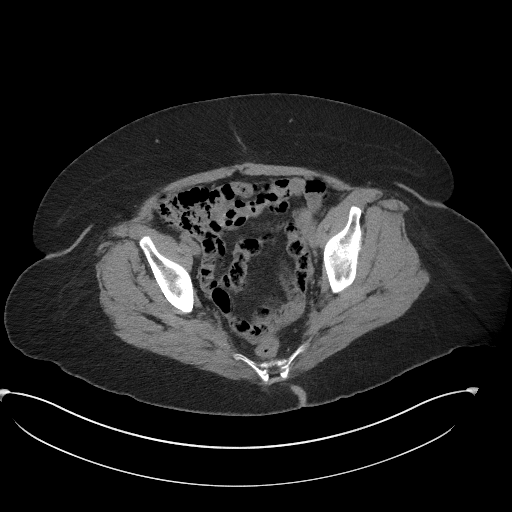
[im 31/88  soft-tissue]
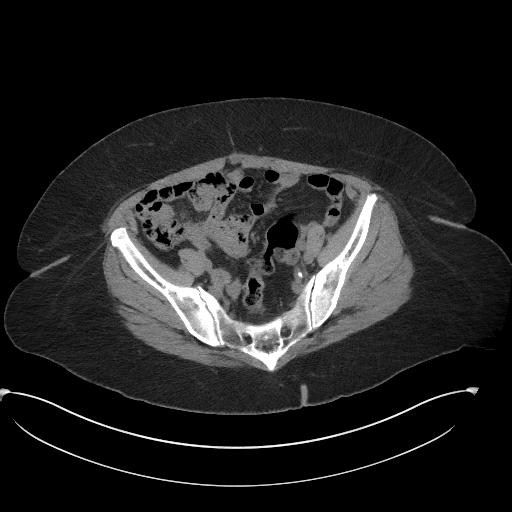
[im 34/88  soft-tissue]
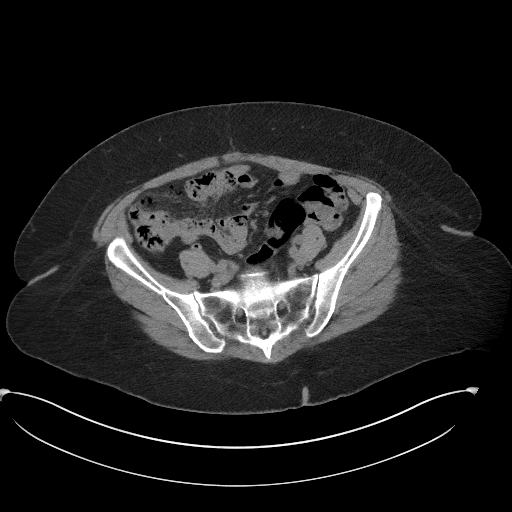
[im 41/88  soft-tissue]
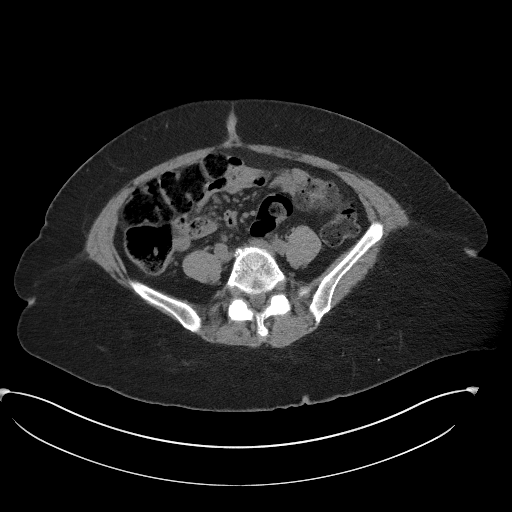
[im 47/88  soft-tissue]
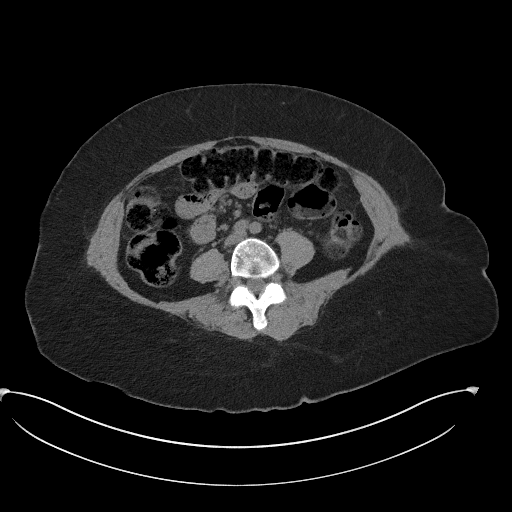
[im 54/88  soft-tissue]
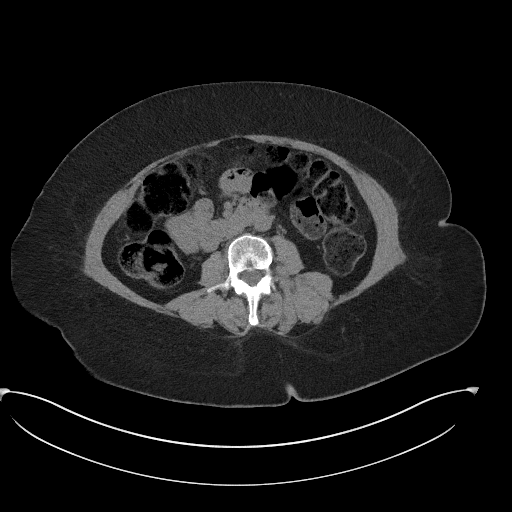
[im 54/88  bone]
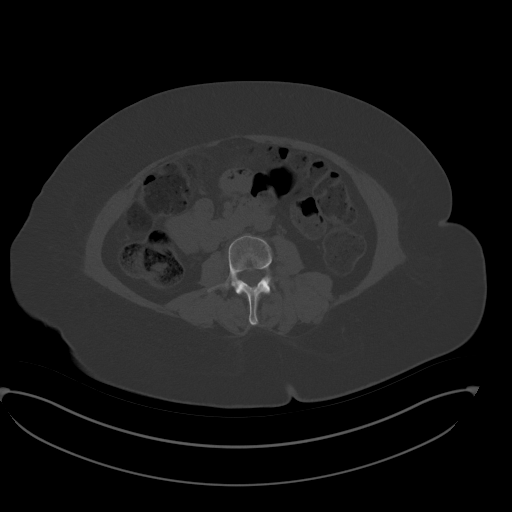
[im 57/88  soft-tissue]
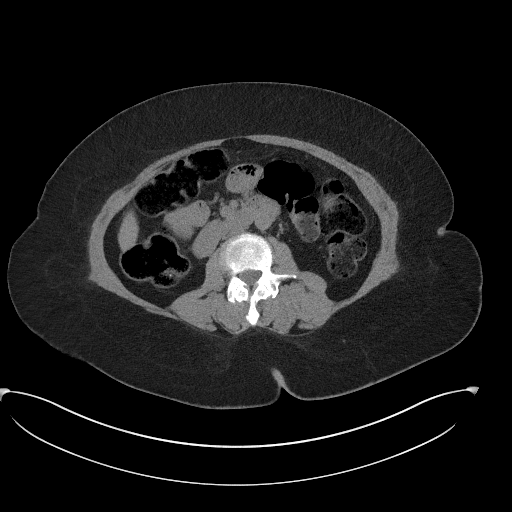
[im 64/88  soft-tissue]
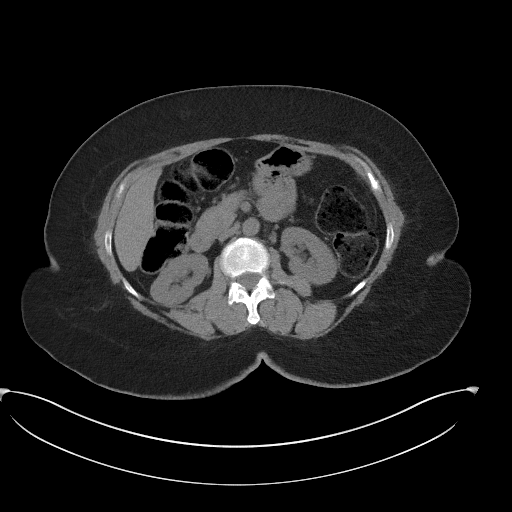
[im 71/88  soft-tissue]
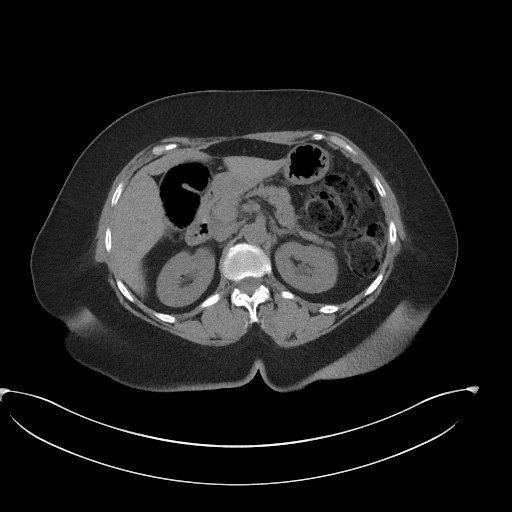
[im 77/88  soft-tissue]
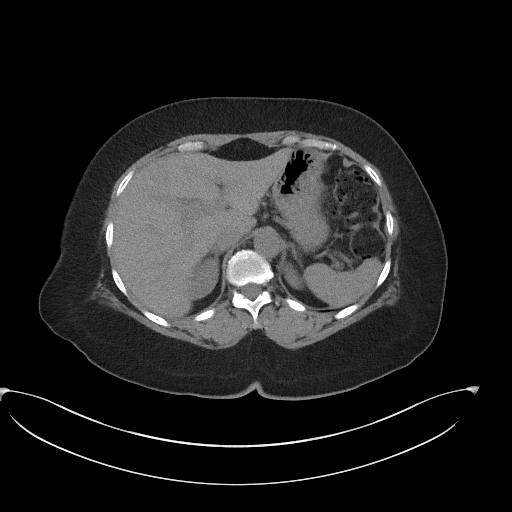
[im 84/88  soft-tissue]
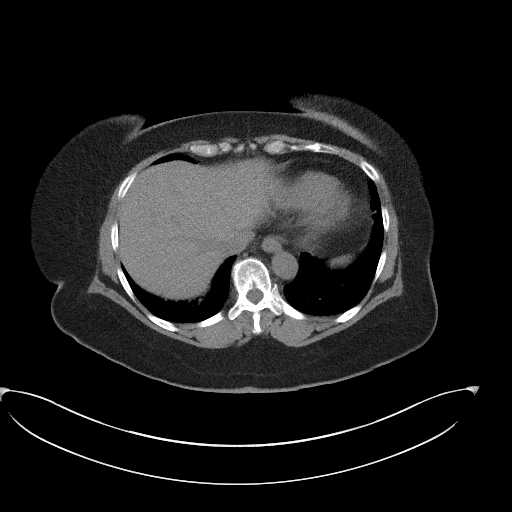

[Series 5: coronal st · coronal · 0.77mm/px · 3 of 104 slices shown]
[im 35/104  soft-tissue]
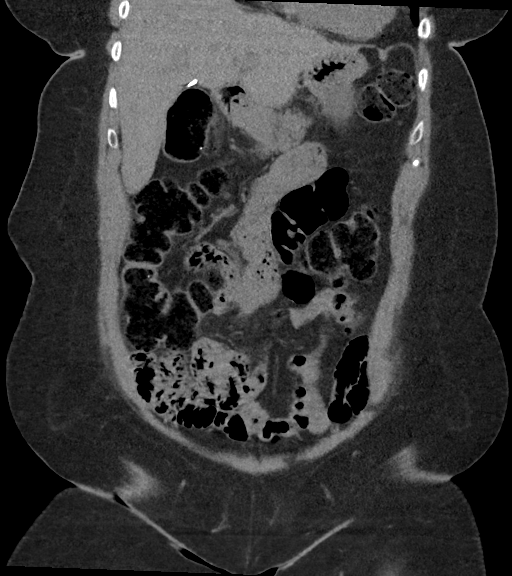
[im 46/104  soft-tissue]
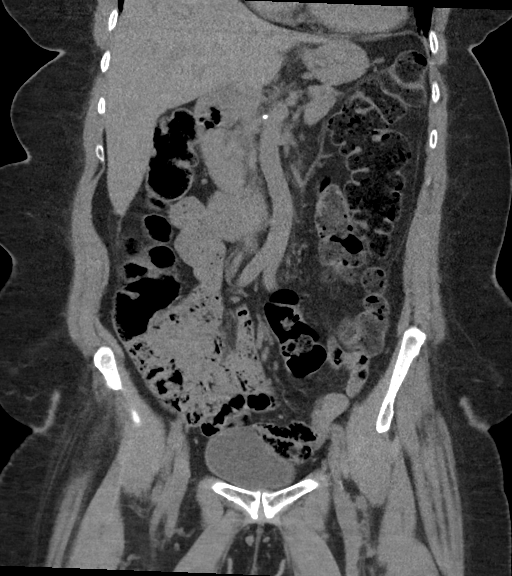
[im 58/104  soft-tissue]
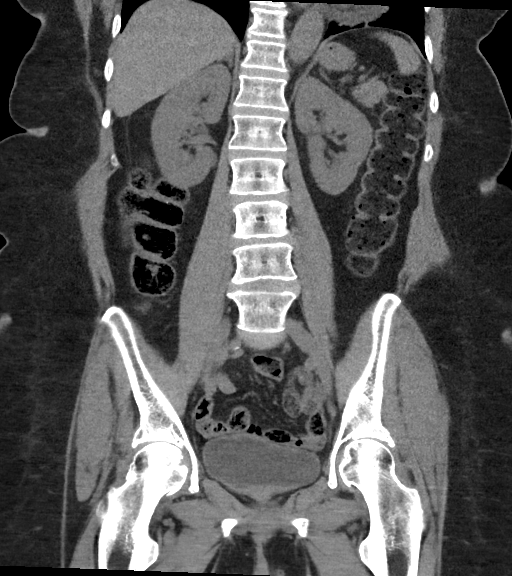

[17 of 46 positions shown; findings below may reference images not displayed]

FINDINGS: Lower chest: No acute abnormality.

Hepatobiliary: No focal liver abnormality is seen. Status post
cholecystectomy. No biliary dilatation.

Pancreas: Unremarkable. No pancreatic ductal dilatation or
surrounding inflammatory changes.

Spleen: Normal in size without focal abnormality.

Adrenals/Urinary Tract: Adrenal glands are unremarkable. Kidneys are
normal, without renal calculi, focal lesion, or hydronephrosis.
Bladder is unremarkable.

Stomach/Bowel: Stomach is within normal limits. Appendix appears
normal. No evidence of bowel wall thickening, distention, or
inflammatory changes. Stool is noted throughout the colon suggesting
constipation.

Vascular/Lymphatic: No significant vascular findings are present. No
enlarged abdominal or pelvic lymph nodes.

Reproductive: Status post hysterectomy. No adnexal masses.

Other: No abdominal wall hernia or abnormality. No abdominopelvic
ascites.

Musculoskeletal: No acute or significant osseous findings.
IMPRESSION: Stool is noted throughout the colon suggesting constipation.

No other abnormality seen in the abdomen or pelvis.

## 2021-09-08 ENCOUNTER — Other Ambulatory Visit: Payer: Self-pay

## 2021-09-08 ENCOUNTER — Emergency Department: Payer: Medicare Other

## 2021-09-08 ENCOUNTER — Emergency Department
Admission: EM | Admit: 2021-09-08 | Discharge: 2021-09-08 | Disposition: A | Discharge status: Home / Self Care | Payer: Medicare Other | Attending: Emergency Medicine | Admitting: Emergency Medicine

## 2021-09-08 DIAGNOSIS — Z91040 Latex allergy status: Secondary | ICD-10-CM | POA: Diagnosis not present

## 2021-09-08 DIAGNOSIS — Z7984 Long term (current) use of oral hypoglycemic drugs: Secondary | ICD-10-CM | POA: Insufficient documentation

## 2021-09-08 DIAGNOSIS — J45909 Unspecified asthma, uncomplicated: Secondary | ICD-10-CM | POA: Diagnosis not present

## 2021-09-08 DIAGNOSIS — I10 Essential (primary) hypertension: Secondary | ICD-10-CM | POA: Diagnosis not present

## 2021-09-08 DIAGNOSIS — M549 Dorsalgia, unspecified: Secondary | ICD-10-CM | POA: Diagnosis present

## 2021-09-08 DIAGNOSIS — Z7982 Long term (current) use of aspirin: Secondary | ICD-10-CM | POA: Diagnosis not present

## 2021-09-08 DIAGNOSIS — M5431 Sciatica, right side: Secondary | ICD-10-CM | POA: Insufficient documentation

## 2021-09-08 DIAGNOSIS — M5430 Sciatica, unspecified side: Secondary | ICD-10-CM

## 2021-09-08 DIAGNOSIS — E119 Type 2 diabetes mellitus without complications: Secondary | ICD-10-CM | POA: Insufficient documentation

## 2021-09-08 MED ORDER — HYDROCODONE-ACETAMINOPHEN 5-325 MG PO TABS: 1.0000 | ORAL_TABLET | Freq: Four times a day (QID) | ORAL | 0 refills | Status: DC | PRN

## 2021-09-08 MED ORDER — METHYLPREDNISOLONE 4 MG PO TBPK: ORAL_TABLET | ORAL | 0 refills | Status: DC

## 2021-09-08 MED ORDER — CYCLOBENZAPRINE HCL 10 MG PO TABS
10.0000 mg | ORAL_TABLET | Freq: Once | ORAL | Status: AC
  Administered 2021-09-08: 10 mg via ORAL
  Filled 2021-09-08: qty 1

## 2021-09-08 MED ORDER — FENTANYL CITRATE PF 50 MCG/ML IJ SOSY
50.0000 ug | PREFILLED_SYRINGE | Freq: Once | INTRAMUSCULAR | Status: AC
  Administered 2021-09-08: 50 ug via INTRAMUSCULAR
  Filled 2021-09-08: qty 1

## 2021-09-08 MED ORDER — BACLOFEN 10 MG PO TABS: 10.0000 mg | ORAL_TABLET | Freq: Three times a day (TID) | ORAL | 0 refills | Status: AC

## 2021-09-08 NOTE — ED Triage Notes (Addendum)
Pt states that she has right sided lower to middle back pain. Fast med was concerned for urinary incontinence but pt states that she sometimes doesn't make it because the pain makes her get up too slow. NOT urinary incontinence-but rather the pain sometimes keeps her from getting to the bathroom fast enough. Pain radiates into her right leg. No known injury.

## 2021-09-08 NOTE — ED Notes (Signed)
Pt dc ppw provided. Rx reviewed. Follow up with NS reviewed/ Pt assisted off unit via wheelchair with family

## 2021-09-08 NOTE — ED Provider Notes (Signed)
[facility] Emergency Department Provider Note  ____________________________________________   Event Date/Time   First MD Initiated Contact with Patient 09/08/21 1335     (approximate)  I have reviewed the triage vital signs and the nursing notes.   HISTORY  Chief Complaint Back Pain    HPI STEPHENY CANAL is a 70 y.o. female  C/o low back pain for 4 day, known known injury, pain is worse with movement, increased with bending over, pain radiates to her leg into her foot on the right side.  History of the same.  States this is much worse.  Denies numbness, tingling, or changes in bowel/urinary habits,  Using otc meds without relief.  Patient was seen at urgent care and sent here due to questionable incontinence.  Patient states "I know I have to pee but I cannot get to the bathroom in time". 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 Authorizing Provider  baclofen (LIORESAL) 10 MG tablet Take 1 tablet (10 mg total) by mouth 3 (three) times daily for 7 days. 09/08/21  09/15/21 Yes Vallory Oetken, Linden Dolin, PA-C  HYDROcodone-acetaminophen (NORCO/VICODIN) 5-325 MG tablet Take 1 tablet by mouth every 6 (six) hours as needed for moderate pain. 09/08/21  Yes Auriel Kist, Linden Dolin, PA-C  methylPREDNISolone (MEDROL DOSEPAK) 4 MG TBPK tablet Take 6 pills on day one then decrease by 1 pill each day 09/08/21  Yes Melodee Lupe, Linden Dolin, PA-C  albuterol (VENTOLIN HFA) 108 (90 Base) MCG/ACT inhaler Inhale 1 puff into the lungs every 6 (six) hours as needed for wheezing.    [provider]  aspirin 81 MG tablet Take 1 tablet (81 mg total) by mouth daily. 01/11/17   Hillary Bow, MD  atorvastatin (LIPITOR) 40 MG tablet TAKE 1 TABLET BY MOUTH ONCE DAILY AT  6PM 04/02/21   Minna Merritts, MD  Besifloxacin HCl (BESIVANCE) 0.6 % SUSP Besivance 0.6 % eye drops,suspension  INSTILL 1 DROP INTO RIGHT EYE THREE TIMES DAILY AS DIRECTED    [provider]  carvedilol (COREG) 6.25 MG tablet Take 1 tablet (6.25 mg total) by mouth 2 (two) times daily with a meal. 03/21/20   Gollan, Kathlene November, MD  cholecalciferol (VITAMIN D3) 25 MCG (1000 UNIT) tablet Take 1,000 Units by mouth daily.    [provider]  clopidogrel (PLAVIX) 75 MG tablet Take 1 tablet (75 mg total) by mouth daily. 03/21/20   Minna Merritts, MD  diazepam (VALIUM) 5 MG tablet  Take 1 tablet 30 minutes prior to MRI Patient not taking: No sig reported 06/30/19   Pieter Partridge, DO  dicyclomine (BENTYL) 10 MG capsule Take 1 capsule (10 mg total) by mouth 4 (four) times daily for 14 days. Patient not taking: Reported on 05/28/2021 03/07/21 05/28/21  Versie Starks, PA-C  furosemide (LASIX) 20 MG tablet Take 1 tablet by mouth once daily 07/05/21   Minna Merritts, MD  metFORMIN (GLUCOPHAGE-XR) 500 MG 24 hr tablet Take 1,000 mg by mouth daily. 11/23/19   [provider]  nortriptyline (PAMELOR) 10 MG capsule Take 1 capsule (10 mg total) by mouth at bedtime. 05/28/21   Pieter Partridge, DO  omeprazole (PRILOSEC) 20 MG capsule  Take 20 mg by mouth daily.    [provider]  ondansetron (ZOFRAN-ODT) 4 MG disintegrating tablet Take 1 tablet (4 mg total) by mouth every 8 (eight) hours as needed. 03/07/21   Uriel Dowding, Linden Dolin, PA-C  polyethylene glycol (GOLYTELY) 236 g solution Drink one 8 oz glass every 10 minutes until entire bottle is consumed. 04/14/21   Horton, Barbette Hair, MD  polyethylene glycol (MIRALAX) 17 g packet Take 17 g by mouth daily. 04/06/21   Nance Pear, MD  potassium chloride (KLOR-CON) 10 MEQ tablet Take 1 tablet by mouth twice daily 04/02/21   Minna Merritts, MD  triamcinolone cream (KENALOG) 0.5 % Apply 1 application topically 2 (two) times daily. Patient not taking: Reported on 05/28/2021 07/13/18   [provider]    Allergies Latex, Lidocaine, and Phenobarbital-belladonna alk  Family History  Problem Relation Age of Onset   Heart Problems Father    Hypertension Other    CAD Other    Breast cancer Neg Hx     Social History Social History   Tobacco Use   Smoking status: Never   Smokeless tobacco: Never  Vaping Use   Vaping Use: Never used  Substance Use Topics   Alcohol use: No   Drug use: No    Review of Systems  Constitutional: No fever/chills Eyes: No visual changes. ENT: No sore throat. Respiratory: Denies cough Genitourinary: Negative for dysuria. Musculoskeletal: Positive for back pain. Skin: Negative for rash.    ____________________________________________   PHYSICAL EXAM:  VITAL SIGNS: ED Triage Vitals  Enc Vitals Group     BP 09/08/21 1309 (!) 144/83     Pulse Rate 09/08/21 1309 74     Resp 09/08/21 1309 20     Temp 09/08/21 1309 98.3 F (36.8 C)     Temp Source 09/08/21 1309 Oral     SpO2 09/08/21 1309 97 %     Weight 09/08/21 1309 214 lb (97.1 kg)     Height 09/08/21 1309 '5\' 5"'  (1.651 m)     Head Circumference --      Peak Flow --      Pain Score 09/08/21 1313 10     Pain Loc --      Pain Edu? --      Excl. in [place]? --      Constitutional: Alert and oriented. Well appearing and in no acute distress. Eyes: Conjunctivae are normal.  Head: Atraumatic. Nose: No congestion/rhinnorhea. Mouth/Throat: Mucous membranes are moist.   Neck:  supple no lymphadenopathy noted Cardiovascular: Normal rate, regular rhythm. Heart sounds are normal Respiratory: Normal respiratory effort.  No retractions, lungs c t a  Abd: soft nontender bs normal all 4 quad GU: deferred Musculoskeletal: FROM all extremities, warm and well perfused.  Decreased rom of back due to discomfort, lumbar spine tender, positive slr, 5/5 strength in great toes b/l, 5/5 strength in lower legs, n/v intact Neurologic:  Normal speech and language.  Skin:  Skin is warm, dry and intact. No rash noted. Psychiatric: Mood and affect are normal. Speech and behavior are normal.  ____________________________________________   LABS (all labs ordered are listed, but only abnormal results are displayed)  Labs Reviewed - No data to display  ____________________________________________   ____________________________________________  RADIOLOGY  X-ray lumbar spine  ____________________________________________   PROCEDURES  Procedure(s) performed: No  Procedures    ____________________________________________   INITIAL IMPRESSION / ASSESSMENT AND PLAN / ED COURSE  Pertinent labs & imaging results that were available during my care of the patient were reviewed by me and considered in my medical decision making (see chart for details).   Patient is a 70 year old female presents with worsening back pain.  See HPI.  Physical exam shows patient be stable.  X-ray of the lumbar spine reviewed by me confirmed by radiology to be negative for any acute abnormality.  Radiologist comments that there is worsening degenerative changes from her previous x-ray.  I did explain the findings to the patient.  She was given fentanyl 50 mg IM and Flexeril 10 mg  p.o.  States she had relief with medication.  Nursing staff instructed to make sure that she is able to stand and walk prior to discharge.  She will be discharged with a Medrol Dosepak, baclofen, and Vicodin for severe pain.  She is to follow-up with [facility] clinic neurosurgery for evaluation.  Injections may help.  She was discharged in stable condition.     As part of my medical decision making, I reviewed the following data within the [place] History obtained from family, Nursing notes reviewed and incorporated, Old chart reviewed, Radiograph reviewed , Notes from prior ED visits, and [place] Controlled Substance Database  ____________________________________________   FINAL CLINICAL IMPRESSION(S) / ED DIAGNOSES  Final diagnoses:  Acute sciatica      NEW MEDICATIONS STARTED DURING THIS VISIT:  New Prescriptions   BACLOFEN (LIORESAL) 10 MG TABLET    Take 1 tablet (10 mg total) by mouth 3 (three) times daily for 7 days.   HYDROCODONE-ACETAMINOPHEN (NORCO/VICODIN) 5-325 MG TABLET    Take 1 tablet by mouth every 6 (six) hours as needed for moderate pain.   METHYLPREDNISOLONE (MEDROL DOSEPAK) 4 MG TBPK TABLET    Take 6 pills on day one then decrease by 1 pill each day     Note:  This document was prepared using Dragon voice recognition software and may include unintentional dictation errors.     Versie Starks, PA-C 09/08/21 1528    Nena Polio, MD 09/08/21 [phone]

## 2021-09-08 NOTE — ED Notes (Signed)
Pt endorsing R sided back pain that radiates down R outer leg when she bends down she is unable to get back up due to the discomfort

## 2021-09-08 NOTE — ED Provider Notes (Signed)
Emergency Medicine Provider Triage Evaluation Note  [facility] , a 70 y.o. female  was evaluated in triage.  Pt complains of back pain since Wednesday. [facility] sent to ER for further evaluation. Patient states that she is not incontinent of urine, but sometimes can't move fast enough to make it to the bathroom in time. No specific injury.  Review of Systems  Positive: Back pain Negative: Fever  Physical Exam  BP (!) 144/83 (BP Location: Left Arm)   Pulse 74   Temp 98.3 F (36.8 C) (Oral)   Resp 20   Ht 5\' 5"  (1.651 m)   Wt 97.1 kg   SpO2 97%   BMI 35.61 kg/m  Gen:   Awake, no distress   Resp:  Normal effort  MSK:   Moves extremities without difficulty  Other:    Medical Decision Making  Medically screening exam initiated at 1:12 PM.  Appropriate orders placed.  [facility] was informed that the remainder of the evaluation will be completed by another provider, this initial triage assessment does not replace that evaluation, and the importance of remaining in the ED until their evaluation is complete.    Rhea Belton, FNP 09/08/21 1315    09/10/21, MD 09/08/21 [phone]

## 2021-09-08 NOTE — Discharge Instructions (Signed)
Follow-up with the back specialist as listed above Take the Medrol Dosepak and baclofen as prescribed Vicodin/hydrocodone is for severe pain not controlled by the 2 previous medications.  This medication may make you constipated and drowsy.  Take a stool softener if constipated.  Do not drive heavy machinery if drowsy

## 2021-09-08 NOTE — ED Triage Notes (Signed)
First Nurse Note:  C/O right lower back pain since Wednesday.  Patient had presented to [facility] who referred patient to ED for work up due to reported new onset of urinary incontinence.

## 2021-11-11 ENCOUNTER — Other Ambulatory Visit: Payer: Self-pay | Admitting: Cardiovascular Disease

## 2021-11-19 ENCOUNTER — Other Ambulatory Visit: Payer: Self-pay | Admitting: Cardiovascular Disease

## 2021-11-19 ENCOUNTER — Ambulatory Visit
Admission: EM | Admit: 2021-11-19 | Discharge: 2021-11-19 | Disposition: A | Discharge status: Home / Self Care | Payer: Medicare Other | Attending: Emergency Medicine | Admitting: Emergency Medicine

## 2021-11-19 ENCOUNTER — Encounter: Payer: Self-pay | Admitting: Emergency Medicine

## 2021-11-19 ENCOUNTER — Other Ambulatory Visit: Payer: Self-pay

## 2021-11-19 DIAGNOSIS — H6121 Impacted cerumen, right ear: Secondary | ICD-10-CM

## 2021-11-19 MED ORDER — CARBAMIDE PEROXIDE 6.5 % OT SOLN: 5.0000 [drp] | Freq: Two times a day (BID) | OTIC | 1 refills | Status: DC

## 2021-11-19 NOTE — ED Provider Notes (Signed)
[facility]    CSN: [id number] Arrival date & time: 11/19/21  1313      History   Chief Complaint Chief Complaint  Patient presents with   Ear Fullness    Right side    HPI ELLYANNA Savage is a 71 y.o. female.   Patient has noticed decreased hearing and fullness to her right ear for the past 2 days.  Patient denies any pain no pressure and no fevers.  Patient has not tried anything prior to arrival.   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      Home Medications    Prior to Admission medications   Medication Sig Start Date End Date Taking? Authorizing Provider  carbamide peroxide (DEBROX) 6.5 % OTIC solution Place 5 drops into the right ear 2 (two) times daily. 11/19/21  Yes Marney Setting, NP  albuterol (VENTOLIN HFA) 108 (90 Base) MCG/ACT inhaler Inhale 1 puff into the lungs every 6 (six) hours as needed for wheezing.    [provider]  aspirin 81 MG tablet Take 1 tablet (81 mg total) by mouth daily. 01/11/17   Hillary Bow, MD  atorvastatin (LIPITOR) 40 MG tablet TAKE 1 TABLET BY MOUTH ONCE DAILY AT   6PM 11/13/21   Minna Merritts, MD  Besifloxacin HCl (BESIVANCE) 0.6 % SUSP Besivance 0.6 % eye drops,suspension  INSTILL 1 DROP INTO RIGHT EYE THREE TIMES DAILY AS DIRECTED    [provider]  carvedilol (COREG) 6.25 MG tablet Take 1 tablet (6.25 mg total) by mouth 2 (two) times daily with a meal. 03/21/20   Gollan, Kathlene November, MD  cholecalciferol (VITAMIN D3) 25 MCG (1000 UNIT) tablet Take 1,000 Units by mouth daily.    [provider]  clopidogrel (PLAVIX) 75 MG tablet Take 1 tablet (75 mg total) by mouth daily. 03/21/20   Minna Merritts, MD  diazepam (VALIUM) 5 MG tablet Take 1 tablet 30 minutes prior to MRI Patient not taking: No sig reported 06/30/19   Pieter Partridge, DO  dicyclomine (BENTYL) 10 MG capsule Take 1 capsule (10 mg total) by mouth 4 (four) times daily for 14 days. Patient not taking: Reported on 05/28/2021 03/07/21 05/28/21  Versie Starks, PA-C  furosemide (LASIX) 20 MG tablet Take 1 tablet by mouth once daily 07/05/21   Minna Merritts, MD  HYDROcodone-acetaminophen (NORCO/VICODIN) 5-325 MG tablet Take 1 tablet by mouth every 6 (six) hours as needed for moderate pain. 09/08/21   Versie Starks, PA-C  metFORMIN (  GLUCOPHAGE-XR) 500 MG 24 hr tablet Take 1,000 mg by mouth daily. 11/23/19   [provider]  methylPREDNISolone (MEDROL DOSEPAK) 4 MG TBPK tablet Take 6 pills on day one then decrease by 1 pill each day 09/08/21   Versie Starks, PA-C  nortriptyline (PAMELOR) 10 MG capsule Take 1 capsule (10 mg total) by mouth at bedtime. 05/28/21   Pieter Partridge, DO  omeprazole (PRILOSEC) 20 MG capsule Take 20 mg by mouth daily.    [provider]  ondansetron (ZOFRAN-ODT) 4 MG disintegrating tablet Take 1 tablet (4 mg total) by mouth every 8 (eight) hours as needed. 03/07/21   Fisher, Linden Dolin, PA-C  polyethylene glycol (GOLYTELY) 236 g solution Drink one 8 oz glass every 10 minutes until entire bottle is consumed. 04/14/21   Horton, Barbette Hair, MD   polyethylene glycol (MIRALAX) 17 g packet Take 17 g by mouth daily. 04/06/21   Nance Pear, MD  potassium chloride (KLOR-CON) 10 MEQ tablet Take 1 tablet by mouth twice daily 04/02/21   Minna Merritts, MD  triamcinolone cream (KENALOG) 0.5 % Apply 1 application topically 2 (two) times daily. Patient not taking: Reported on 05/28/2021 07/13/18   [provider]    Family History Family History  Problem Relation Age of Onset   Heart Problems Father    Hypertension Other    CAD Other    Breast cancer Neg Hx     Social History Social History   Tobacco Use   Smoking status: Never   Smokeless tobacco: Never  Vaping Use   Vaping Use: Never used  Substance Use Topics   Alcohol use: No   Drug use: No     Allergies   Latex, Lidocaine, and Phenobarbital-belladonna alk   Review of Systems Review of Systems  Constitutional:  Negative for fatigue and fever.  HENT:         Right ear fullness  Eyes: Negative.   Respiratory: Negative.    Cardiovascular: Negative.   Gastrointestinal: Negative.   Neurological: Negative.     Physical Exam Triage Vital Signs ED Triage Vitals  Enc Vitals Group     BP 11/19/21 1447 123/67     Pulse Rate 11/19/21 1447 74     Resp 11/19/21 1447 16     Temp 11/19/21 1447 98.7 F (37.1 C)     Temp Source 11/19/21 1447 Oral     SpO2 11/19/21 1447 99 %     Weight --      Height --      Head Circumference --      Peak Flow --      Pain Score 11/19/21 1445 0     Pain Loc --      Pain Edu? --      Excl. in [place]? --    No data found.  Updated Vital Signs BP 123/67    Pulse 74    Temp 98.7 F (37.1 C) (Oral)    Resp 16    SpO2 99%   Visual Acuity Right Eye Distance:   Left Eye Distance:   Bilateral Distance:    Right Eye Near:   Left Eye Near:    Bilateral Near:     Physical Exam Constitutional:      Appearance: Normal appearance.  HENT:     Right Ear: There is impacted cerumen.     Left Ear: There is no impacted  cerumen.     Nose: Nose normal.  Mouth/Throat:     Mouth: Mucous membranes are moist.  Eyes:     Pupils: Pupils are equal, round, and reactive to light.  Cardiovascular:     Rate and Rhythm: Normal rate.  Musculoskeletal:     Cervical back: Normal range of motion.  Skin:    Capillary Refill: Capillary refill takes less than 2 seconds.  Neurological:     General: No focal deficit present.     Mental Status: She is alert.     UC Treatments / Results  Labs (all labs ordered are listed, but only abnormal results are displayed) Labs Reviewed - No data to display  EKG   Radiology No results found.  Procedures Procedures (including critical care time)  Medications Ordered in UC Medications - No data to display  Initial Impression / Assessment and Plan / UC Course  I have reviewed the triage vital signs and the nursing notes.  Pertinent labs & imaging results that were available during my care of the patient were reviewed by me and considered in my medical decision making (see chart for details).     Apply eardrops to the right ear allow it to sit for several minutes and then use warm water to irrigate. You may need to do this several times to decrease the amount of earwax. Final Clinical Impressions(s) / UC Diagnoses   Final diagnoses:  Impacted cerumen of right ear   Discharge Instructions   None    ED Prescriptions     Medication Sig Dispense Auth. Provider   carbamide peroxide (DEBROX) 6.5 % OTIC solution Place 5 drops into the right ear 2 (two) times daily. 15 mL Marney Setting, NP      PDMP not reviewed this encounter.   Marney Setting, NP 11/19/21 1511

## 2021-11-19 NOTE — ED Triage Notes (Signed)
PT reports right ear fullness and decreased hearing

## 2021-12-24 ENCOUNTER — Other Ambulatory Visit: Payer: Self-pay

## 2021-12-24 ENCOUNTER — Encounter: Payer: Self-pay | Admitting: Neurology

## 2021-12-24 ENCOUNTER — Ambulatory Visit: Payer: Medicare Other | Admitting: Neurology

## 2021-12-24 VITALS — BP 156/85 | HR 81 | Ht 66.0 in | Wt 208.4 lb

## 2021-12-24 DIAGNOSIS — G3184 Mild cognitive impairment, so stated: Secondary | ICD-10-CM

## 2021-12-24 DIAGNOSIS — G4733 Obstructive sleep apnea (adult) (pediatric): Secondary | ICD-10-CM

## 2021-12-24 DIAGNOSIS — G43709 Chronic migraine without aura, not intractable, without status migrainosus: Secondary | ICD-10-CM
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
## 2021-12-24 NOTE — Patient Instructions (Signed)
Take nortriptyline 10mg  at bedtime.   In one month, contact me and we can start donepezil (a medication for memory) Limit use of pain relievers (such as Tylenol) to no more than 2 days out of week to prevent risk of rebound or medication-overuse headache. Keep headache diary Use CPAP every night. Increase water intake (64 oz daily) Do not skip meals Routine exercise Follow up 6 months.

## 2022-01-07 ENCOUNTER — Other Ambulatory Visit: Payer: Self-pay | Admitting: Cardiovascular Disease

## 2022-01-21 ENCOUNTER — Other Ambulatory Visit: Payer: Self-pay | Admitting: Physician Assistant

## 2022-01-21 DIAGNOSIS — R1030 Lower abdominal pain, unspecified: Secondary | ICD-10-CM

## 2022-01-24 ENCOUNTER — Other Ambulatory Visit: Payer: Self-pay | Admitting: Internal Medicine

## 2022-01-24 DIAGNOSIS — Z1231 Encounter for screening mammogram for malignant neoplasm of breast: Secondary | ICD-10-CM

## 2022-02-11 ENCOUNTER — Ambulatory Visit
Admission: RE | Admit: 2022-02-11 | Discharge: 2022-02-11 | Disposition: A | Discharge status: Home / Self Care | Payer: Medicare Other | Source: Ambulatory Visit | Attending: Physician Assistant | Admitting: Physician Assistant

## 2022-02-11 DIAGNOSIS — R1030 Lower abdominal pain, unspecified: Secondary | ICD-10-CM

## 2022-02-11 MED ORDER — IOPAMIDOL (ISOVUE-300) INJECTION 61%
100.0000 mL | Freq: Once | INTRAVENOUS | Status: AC | PRN
  Administered 2022-02-11: 100 mL via INTRAVENOUS

## 2022-02-22 ENCOUNTER — Other Ambulatory Visit: Payer: Self-pay | Admitting: Physician Assistant

## 2022-02-22 DIAGNOSIS — R110 Nausea: Secondary | ICD-10-CM

## 2022-02-22 DIAGNOSIS — R634 Abnormal weight loss: Secondary | ICD-10-CM

## 2022-02-22 DIAGNOSIS — R748 Abnormal levels of other serum enzymes: Secondary | ICD-10-CM

## 2022-02-26 ENCOUNTER — Ambulatory Visit: Payer: Medicare Other | Admitting: Neurology

## 2022-03-05 ENCOUNTER — Ambulatory Visit
Admission: RE | Admit: 2022-03-05 | Discharge: 2022-03-05 | Disposition: A | Discharge status: Home / Self Care | Payer: Medicare Other | Source: Ambulatory Visit | Attending: Internal Medicine | Admitting: Internal Medicine

## 2022-03-05 DIAGNOSIS — Z1231 Encounter for screening mammogram for malignant neoplasm of breast: Secondary | ICD-10-CM | POA: Insufficient documentation

## 2022-03-12 ENCOUNTER — Ambulatory Visit
Admission: RE | Admit: 2022-03-12 | Discharge: 2022-03-12 | Disposition: A | Discharge status: Home / Self Care | Payer: Medicare Other | Source: Ambulatory Visit | Attending: Physician Assistant | Admitting: Physician Assistant

## 2022-03-12 DIAGNOSIS — R634 Abnormal weight loss: Secondary | ICD-10-CM

## 2022-03-12 DIAGNOSIS — R110 Nausea: Secondary | ICD-10-CM

## 2022-03-12 DIAGNOSIS — R748 Abnormal levels of other serum enzymes: Secondary | ICD-10-CM

## 2022-03-12 MED ORDER — GADOBENATE DIMEGLUMINE 529 MG/ML IV SOLN
19.0000 mL | Freq: Once | INTRAVENOUS | Status: AC | PRN
  Administered 2022-03-12: 19 mL via INTRAVENOUS

## 2022-03-31 NOTE — Progress Notes (Deleted)
Gastroenterology Consultation  Referring Provider:     Albina Billet, MD Primary Care Physician:  Albina Billet, MD Primary Gastroenterologist:  Dr. Allen Norris     Reason for Consultation:     ***        HPI:   Destiny Savage is a 71 y.o. y/o female referred for consultation & management of *** by Dr. Albina Billet, MD.  This patient comes in today after being seen in [facility] by [facility] for some time with multiple procedures by them.  It appears that from the primary care provider's note the patient has had an extensive GI work-up with a normal gastric emptying study in May of last year and a normal EGD in July of last year and a normal colonoscopy in July of last year.  The patient was also noted to have a CT scan that showed a moderate amount of constipation.  It was also reported that the patient had a colonoscopy in 2016 in [place] with 2 adenomatous polyps removed at that time.  The patient was referred back to [facility] for her present symptoms but the patient's husband stated that he he wanted the wife to be seen in [place].   Past Medical History:  Diagnosis Date   Arthritis    Asthma    Diabetes mellitus    GERD (gastroesophageal reflux disease)    Hyperlipidemia    Hypertension    Mini stroke    Stroke [facility])     Past Surgical History:  Procedure Laterality Date   ABDOMINAL HYSTERECTOMY     BREAST EXCISIONAL BIOPSY Left 1980's   benign   CARDIAC CATHETERIZATION     CHOLECYSTECTOMY      Prior to Admission medications   Medication Sig Start Date End Date Taking? Authorizing Provider  albuterol (VENTOLIN HFA) 108 (90 Base) MCG/ACT inhaler Inhale 1 puff into the lungs every 6 (six) hours as needed for wheezing.    [provider]  aspirin 81 MG tablet Take 1 tablet (81 mg total) by mouth daily. 01/11/17   Hillary Bow, MD  atorvastatin (LIPITOR) 40 MG tablet TAKE 1 TABLET BY MOUTH ONCE DAILY AT  6PM 11/13/21   Minna Merritts, MD  Besifloxacin HCl  (BESIVANCE) 0.6 % SUSP Besivance 0.6 % eye drops,suspension  INSTILL 1 DROP INTO RIGHT EYE THREE TIMES DAILY AS DIRECTED    [provider]  carbamide peroxide (DEBROX) 6.5 % OTIC solution Place 5 drops into the right ear 2 (two) times daily. 11/19/21   Marney Setting, NP  carvedilol (COREG) 6.25 MG tablet Take 1 tablet (6.25 mg total) by mouth 2 (two) times daily with a meal. 03/21/20   Gollan, Kathlene November, MD  cholecalciferol (VITAMIN D3) 25 MCG (1000 UNIT) tablet Take 1,000 Units by mouth daily.    [provider]  clopidogrel (PLAVIX) 75 MG tablet Take 1 tablet (75 mg total) by mouth daily. 03/21/20   Minna Merritts, MD  furosemide (LASIX) 20 MG tablet Take 1 tablet by mouth once daily 01/07/22   Minna Merritts, MD  HYDROcodone-acetaminophen (NORCO/VICODIN) 5-325 MG tablet Take 1 tablet by mouth every 6 (six) hours as needed for moderate pain. 09/08/21   Fisher, Linden Dolin, PA-C  metFORMIN (GLUCOPHAGE-XR) 500 MG 24 hr tablet Take 1,000 mg by mouth daily. 11/23/19   [provider]  methylPREDNISolone (MEDROL DOSEPAK) 4 MG TBPK tablet Take 6 pills on day one then decrease by 1 pill each day  09/08/21   Fisher, Linden Dolin, PA-C  nortriptyline (PAMELOR) 10 MG capsule Take 1 capsule (10 mg total) by mouth at bedtime. 05/28/21   Pieter Partridge, DO  omeprazole (PRILOSEC) 20 MG capsule Take 20 mg by mouth daily.    [provider]  ondansetron (ZOFRAN-ODT) 4 MG disintegrating tablet Take 1 tablet (4 mg total) by mouth every 8 (eight) hours as needed. 03/07/21   Fisher, Linden Dolin, PA-C  polyethylene glycol (MIRALAX) 17 g packet Take 17 g by mouth daily. 04/06/21   Nance Pear, MD  potassium chloride (KLOR-CON) 10 MEQ tablet Take 1 tablet by mouth twice daily 11/20/21   Minna Merritts, MD  triamcinolone cream (KENALOG) 0.5 % Apply 1 application topically 2 (two) times daily. 07/13/18   [provider]    Family History  Problem Relation Age of Onset   Heart  Problems Father    Hypertension Other    CAD Other    Breast cancer Neg Hx      Social History   Tobacco Use   Smoking status: Never   Smokeless tobacco: Never  Vaping Use   Vaping Use: Never used  Substance Use Topics   Alcohol use: No   Drug use: No    Allergies as of 04/01/2022 - Review Complete 12/24/2021  Allergen Reaction Noted   Latex Hives 05/10/2012   Lidocaine Hives 05/10/2012   Phenobarbital-belladonna alk Hives and Rash     Review of Systems:    All systems reviewed and negative except where noted in HPI.   Physical Exam:  There were no vitals taken for this visit. No LMP recorded. Patient has had a hysterectomy. General:   Alert,  Well-developed, well-nourished, pleasant and cooperative in NAD Head:  Normocephalic and atraumatic. Eyes:  Sclera clear, no icterus.   Conjunctiva pink. Ears:  Normal auditory acuity. Neck:  Supple; no masses or thyromegaly. Lungs:  Respirations even and unlabored.  Clear throughout to auscultation.   No wheezes, crackles, or rhonchi. No acute distress. Heart:  Regular rate and rhythm; no murmurs, clicks, rubs, or gallops. Abdomen:  Normal bowel sounds.  No bruits.  Soft, non-tender and non-distended without masses, hepatosplenomegaly or hernias noted.  No guarding or rebound tenderness.  Negative Carnett sign.   Rectal:  Deferred.  Pulses:  Normal pulses noted. Extremities:  No clubbing or edema.  No cyanosis. Neurologic:  Alert and oriented x3;  grossly normal neurologically. Skin:  Intact without significant lesions or rashes.  No jaundice. Lymph Nodes:  No significant cervical adenopathy. Psych:  Alert and cooperative. Normal mood and affect.  Imaging Studies: MR ABDOMEN MRCP W WO CONTAST  Result Date: 03/12/2022 CLINICAL DATA:  Nausea and weight loss for 2-3 months. LEFT-sided abdominal pain. EXAM: MRI ABDOMEN WITHOUT AND WITH CONTRAST (INCLUDING MRCP) TECHNIQUE: Multiplanar multisequence MR imaging of the abdomen was  performed both before and after the administration of intravenous contrast. Heavily T2-weighted images of the biliary and pancreatic ducts were obtained, and three-dimensional MRCP images were rendered by post processing. CONTRAST:  53m MULTIHANCE GADOBENATE DIMEGLUMINE 529 MG/ML IV SOLN COMPARISON:  April of 2022. FINDINGS: Lower chest: Unremarkable to the extent evaluated on this abdominal MRI. Hepatobiliary: No focal, suspicious hepatic lesion. Portal vein is patent. Hepatic veins are patent. Biliary tree is mildly dilated unchanged since April of 2022 following cholecystectomy. Pancreas: Normal intrinsic T1 signal. No ductal dilation or sign of inflammation. No focal lesion. Spleen:  Normal. Adrenals/Urinary Tract:  Adrenal glands are normal. Symmetric renal  enhancement without suspicious renal lesion, hydronephrosis or perinephric stranding. Stomach/Bowel: No acute gastrointestinal process to the extent evaluated on this abdominal MRI. Large volume of stool throughout the colon. Vascular/Lymphatic: No pathologically enlarged lymph nodes identified. No abdominal aortic aneurysm demonstrated. Other:  None Musculoskeletal: No suspicious bone lesions identified. IMPRESSION: 1. No acute findings in the abdomen. 2. Biliary tree is mildly dilated unchanged since April of 2022 following cholecystectomy and without signs of filling defect. 3. Large volume of stool throughout the colon.  Query constipation. Electronically Signed   By: Zetta Bills M.D.   On: 03/12/2022 17:51   MM 3D SCREEN BREAST BILATERAL  Result Date: 03/05/2022 CLINICAL DATA:  Screening. EXAM: DIGITAL SCREENING BILATERAL MAMMOGRAM WITH TOMOSYNTHESIS AND CAD TECHNIQUE: Bilateral screening digital craniocaudal and mediolateral oblique mammograms were obtained. Bilateral screening digital breast tomosynthesis was performed. The images were evaluated with computer-aided detection. COMPARISON:  Previous exam(s). ACR Breast Density Category b: There  are scattered areas of fibroglandular density. FINDINGS: There are no findings suspicious for malignancy. IMPRESSION: No mammographic evidence of malignancy. A result letter of this screening mammogram will be mailed directly to the patient. RECOMMENDATION: Screening mammogram in one year. (Code:SM-B-01Y) BI-RADS CATEGORY  1: Negative. Electronically Signed   By: Everlean Alstrom M.D.   On: 03/05/2022 14:25    Assessment and Plan:   Destiny Savage is a 71 y.o. y/o female ***    Lucilla Lame, MD. Marval Regal    Note: This dictation was prepared with Dragon dictation along with smaller phrase technology. Any transcriptional errors that result from this process are unintentional.

## 2022-04-01 ENCOUNTER — Ambulatory Visit: Payer: Medicare Other | Admitting: Gastroenterology

## 2022-04-04 ENCOUNTER — Other Ambulatory Visit: Payer: Self-pay | Admitting: Family Medicine

## 2022-04-04 ENCOUNTER — Emergency Department
Admission: EM | Admit: 2022-04-04 | Discharge: 2022-04-04 | Disposition: A | Discharge status: Home / Self Care | Payer: Medicare Other | Attending: Emergency Medicine | Admitting: Emergency Medicine

## 2022-04-04 ENCOUNTER — Other Ambulatory Visit: Payer: Self-pay

## 2022-04-04 ENCOUNTER — Emergency Department: Payer: Medicare Other

## 2022-04-04 ENCOUNTER — Other Ambulatory Visit (HOSPITAL_COMMUNITY): Payer: Self-pay | Admitting: Family Medicine

## 2022-04-04 DIAGNOSIS — R1032 Left lower quadrant pain: Secondary | ICD-10-CM | POA: Diagnosis present

## 2022-04-04 DIAGNOSIS — I10 Essential (primary) hypertension: Secondary | ICD-10-CM | POA: Diagnosis not present

## 2022-04-04 DIAGNOSIS — R630 Anorexia: Secondary | ICD-10-CM | POA: Diagnosis not present

## 2022-04-04 DIAGNOSIS — G8929 Other chronic pain: Secondary | ICD-10-CM | POA: Insufficient documentation

## 2022-04-04 DIAGNOSIS — R6881 Early satiety: Secondary | ICD-10-CM

## 2022-04-04 DIAGNOSIS — R110 Nausea: Secondary | ICD-10-CM | POA: Insufficient documentation

## 2022-04-04 DIAGNOSIS — R748 Abnormal levels of other serum enzymes: Secondary | ICD-10-CM | POA: Diagnosis not present

## 2022-04-04 DIAGNOSIS — R140 Abdominal distension (gaseous): Secondary | ICD-10-CM

## 2022-04-04 LAB — COMPREHENSIVE METABOLIC PANEL
ALT: 19 U/L (ref 0–44)
AST: 27 U/L (ref 15–41)
Albumin: 4.2 g/dL (ref 3.5–5.0)
Alkaline Phosphatase: 73 U/L (ref 38–126)
Anion gap: 11 (ref 5–15)
BUN: 9 mg/dL (ref 8–23)
CO2: 26 mmol/L (ref 22–32)
Calcium: 9.7 mg/dL (ref 8.9–10.3)
Chloride: 101 mmol/L (ref 98–111)
Creatinine, Ser: 0.82 mg/dL (ref 0.44–1.00)
GFR, Estimated: >60 mL/min (ref >60)
Glucose, Bld: 133 mg/dL — ABNORMAL HIGH (ref 70–99)
Potassium: 3.8 mmol/L (ref 3.5–5.1)
Sodium: 138 mmol/L (ref 135–145)
Total Bilirubin: 0.5 mg/dL (ref 0.3–1.2)
Total Protein: 7.3 g/dL (ref 6.5–8.1)

## 2022-04-04 LAB — URINALYSIS, ROUTINE W REFLEX MICROSCOPIC
Bilirubin Urine: NEGATIVE
Glucose, UA: NEGATIVE mg/dL
Hgb urine dipstick: NEGATIVE
Ketones, ur: NEGATIVE mg/dL
Leukocytes,Ua: NEGATIVE
Nitrite: NEGATIVE
Protein, ur: NEGATIVE mg/dL
Specific Gravity, Urine: 1.01 (ref 1.005–1.030)
pH: 5 (ref 5.0–8.0)

## 2022-04-04 LAB — CBC
HCT: 38.3 % (ref 36.0–46.0)
Hemoglobin: 12.3 g/dL (ref 12.0–15.0)
MCH: 30 pg (ref 26.0–34.0)
MCHC: 32.1 g/dL (ref 30.0–36.0)
MCV: 93.4 fL (ref 80.0–100.0)
Platelets: 288 10*3/uL (ref 150–400)
RBC: 4.1 MIL/uL (ref 3.87–5.11)
RDW: 12.9 % (ref 11.5–15.5)
WBC: 8.3 10*3/uL (ref 4.0–10.5)
nRBC: 0 % (ref 0.0–0.2)

## 2022-04-04 LAB — LIPASE, BLOOD: Lipase: 94 U/L — ABNORMAL HIGH (ref 11–51)

## 2022-04-04 MED ORDER — METOCLOPRAMIDE HCL 10 MG PO TABS: 10.0000 mg | ORAL_TABLET | Freq: Three times a day (TID) | ORAL | 0 refills | Status: DC | PRN

## 2022-04-04 NOTE — ED Triage Notes (Signed)
Pt here with abd pain for a month that has gotten worse over time. Pt states now she has not been eating a lot and she is having a lot of nausea with different smells. Pt also reports weight loss.

## 2022-04-04 NOTE — ED Provider Notes (Signed)
[facility] Provider Note    Event Date/Time   First MD Initiated Contact with Patient 04/04/22 0901     (approximate)   History   Abdominal Pain   HPI  Destiny Savage is a 71 y.o. female with history of hypertension and high cholesterol who presents with nausea and decreased appetite which has been occurring for approximately 2 months, persistent course, and unchanged in the last several days.  The patient states that almost any smell makes her nauseated.  She will then spit up saliva but does not vomit.  She states that she has very little appetite although she is able to eat some fruit and Cheerios a few times a day and when she eats she is able to hold these down.  She also reports abdominal pain over the same time period, mainly in the left lower quadrant, and occurring intermittently throughout the day.  She denies any diarrhea.  She previously had been constipated but took some laxatives and this has improved.  The patient has been seen by her primary care doctor and was referred to GI but has not seen them yet.      Physical Exam   Triage Vital Signs: ED Triage Vitals  Enc Vitals Group     BP 04/04/22 0859 (!) 160/85     Pulse Rate 04/04/22 0859 78     Resp 04/04/22 0859 18     Temp 04/04/22 0859 98.4 F (36.9 C)     Temp Source 04/04/22 0859 Oral     SpO2 04/04/22 0859 96 %     Weight 04/04/22 0859 208 lb 5.4 oz (94.5 kg)     Height 04/04/22 0859 5\' 6"  (1.676 m)     Head Circumference --      Peak Flow --      Pain Score 04/04/22 0900 10     Pain Loc --      Pain Edu? --      Excl. in GC? --     Most recent vital signs: Vitals:   04/04/22 0859  BP: (!) 160/85  Pulse: 78  Resp: 18  Temp: 98.4 F (36.9 C)  SpO2: 96%    General: Awake, well-appearing, no distress.  CV:  Good peripheral perfusion.  Resp:  Normal effort.  Abd:  Soft with mild left lower quadrant discomfort.  No focal tenderness or peritoneal signs.  No distention.   Other:  Mucous membranes moist.   ED Results / Procedures / Treatments   Labs (all labs ordered are listed, but only abnormal results are displayed) Labs Reviewed  LIPASE, BLOOD - Abnormal; Notable for the following components:      Result Value   Lipase 94 (*)    All other components within normal limits  COMPREHENSIVE METABOLIC PANEL - Abnormal; Notable for the following components:   Glucose, Bld 133 (*)    All other components within normal limits  URINALYSIS, ROUTINE W REFLEX MICROSCOPIC - Abnormal; Notable for the following components:   Color, Urine YELLOW (*)    APPearance CLEAR (*)    All other components within normal limits  CBC     EKG     RADIOLOGY  04/06/22 pelvis: I independently viewed and interpreted the images; there is no mass or free fluid.  Radiology report notes the following:  IMPRESSION:  Hysterectomy. Lack of visualization of the ovaries. No adnexal mass  or explanation for patient's symptoms.    PROCEDURES:  Critical Care performed:  No  Procedures   MEDICATIONS ORDERED IN ED: Medications - No data to display   IMPRESSION / MDM / ASSESSMENT AND PLAN / ED COURSE  I reviewed the triage vital signs and the nursing notes.  71 year old female with PMH as noted above presents with now chronic nausea, decreased appetite, and abdominal pain, with no acute change today.  The patient was seen by her PMD a few days ago for this but states that she was not clear on the plan and does not feel like anything is being done for this so decided to come to the ED.  On exam the patient is well-appearing.  Her vital signs are normal.  The physical exam is unremarkable except for some mild discomfort on palpation of the left lower abdomen.  I reviewed the past medical records.  The patient was seen by her PMD Dr. Lanny Hurst on 5/16 for these symptoms.  She had a CT of 3/27 which was negative for acute findings except for a large amount of stool in the colon.  MRCP on  4/25 was negative for acute findings.  Per the note from 5/16, the plan was to refer the patient for a pelvic ultrasound to rule out ovarian etiology and referred to GI, however the patient has not yet seen GI.  Differential diagnosis includes, but is not limited to, constipation, IBS, other functional etiology, gastritis, PUD, gastroparesis, other GI dysmotility.  Given that the symptoms have been unchanged for few months there is no indication for repeat CT or MRI of the abdomen.  We will obtain labs to rule out electrolyte abnormalities or other sequela of her decreased appetite and obtain the pelvic ultrasound to further evaluate the left lower abdominal pain.  ----------------------------------------- 12:36 PM on 04/04/2022 -----------------------------------------  Ultrasound shows no acute abnormalities.  On further history, she states that her hysterectomy was many years ago and she is not sure if her ovaries were taken out or not.  Her ovaries were not visualized on the scan so I suspect that she had hysterectomy with salpingo-oophorectomy.  Lab work-up is unremarkable except for lipase which is slightly elevated at 94.  I compared this to labs from a month ago which showed lipase in the 80s, and 1 year ago in the 70s.  There is no evidence clinically of acute pancreatitis or biliary obstruction.  MRCP from 3 weeks ago was normal.  At this time, the patient is stable for discharge.  I will switch her to Reglan in case there is a component of gastroparesis.  I have given her GI referral.  I discussed the results of the work-up and plan of care with the patient and gave her very thorough return precautions; she expresses understanding.   FINAL CLINICAL IMPRESSION(S) / ED DIAGNOSES   Final diagnoses:  Nausea  Chronic abdominal pain     Rx / DC Orders   ED Discharge Orders          Ordered    metoCLOPramide (REGLAN) 10 MG tablet  Every 8 hours PRN        04/04/22 1234              Note:  This document was prepared using Dragon voice recognition software and may include unintentional dictation errors.    Dionne Bucy, MD 04/04/22 [phone]

## 2022-04-04 NOTE — ED Notes (Signed)
Pt to ED for LLQ abdominal pain. States suffers from chronic constipation and has seen PCP for same who told her abdomen was full of stool. States has no appetite and has been spitting a lot. LBM was 2 days ago, small.

## 2022-04-04 NOTE — Discharge Instructions (Addendum)
We have prescribed Reglan (metoclopramide) which is a different nausea medication.  You can stop taking the Zofran under the tongue tablet and start taking this instead.  Your blood work all looks normal except for your lipase (pancreas enzyme) which has been gradually increasing over the last year.  You should follow-up with a gastroenterologist.  A referral has been provided.  You will need to call to schedule an appointment.  In the meantime, return to the emergency department for new, worsening, or persistent severe nausea, vomiting, abdominal pain, fever, weakness, or any other new or worsening symptoms that concern you.

## 2022-04-09 ENCOUNTER — Other Ambulatory Visit: Payer: Self-pay

## 2022-04-09 ENCOUNTER — Emergency Department
Admission: EM | Admit: 2022-04-09 | Discharge: 2022-04-09 | Disposition: A | Discharge status: Home / Self Care | Payer: Medicare Other | Attending: Emergency Medicine | Admitting: Emergency Medicine

## 2022-04-09 ENCOUNTER — Emergency Department: Payer: Medicare Other

## 2022-04-09 ENCOUNTER — Encounter: Payer: Self-pay | Admitting: Intensive Care

## 2022-04-09 DIAGNOSIS — R002 Palpitations: Secondary | ICD-10-CM | POA: Diagnosis present

## 2022-04-09 DIAGNOSIS — R630 Anorexia: Secondary | ICD-10-CM | POA: Diagnosis not present

## 2022-04-09 DIAGNOSIS — Z7982 Long term (current) use of aspirin: Secondary | ICD-10-CM | POA: Insufficient documentation

## 2022-04-09 DIAGNOSIS — Z7901 Long term (current) use of anticoagulants: Secondary | ICD-10-CM | POA: Insufficient documentation

## 2022-04-09 DIAGNOSIS — R110 Nausea: Secondary | ICD-10-CM | POA: Insufficient documentation

## 2022-04-09 DIAGNOSIS — R1032 Left lower quadrant pain: Secondary | ICD-10-CM | POA: Diagnosis not present

## 2022-04-09 DIAGNOSIS — J45909 Unspecified asthma, uncomplicated: Secondary | ICD-10-CM | POA: Insufficient documentation

## 2022-04-09 DIAGNOSIS — E119 Type 2 diabetes mellitus without complications: Secondary | ICD-10-CM | POA: Insufficient documentation

## 2022-04-09 DIAGNOSIS — Z7984 Long term (current) use of oral hypoglycemic drugs: Secondary | ICD-10-CM | POA: Diagnosis not present

## 2022-04-09 DIAGNOSIS — R0602 Shortness of breath: Secondary | ICD-10-CM | POA: Diagnosis not present

## 2022-04-09 DIAGNOSIS — Z79899 Other long term (current) drug therapy: Secondary | ICD-10-CM | POA: Insufficient documentation

## 2022-04-09 DIAGNOSIS — I10 Essential (primary) hypertension: Secondary | ICD-10-CM | POA: Insufficient documentation

## 2022-04-09 LAB — URINALYSIS, ROUTINE W REFLEX MICROSCOPIC
Bilirubin Urine: NEGATIVE
Glucose, UA: NEGATIVE mg/dL
Hgb urine dipstick: NEGATIVE
Ketones, ur: NEGATIVE mg/dL
Leukocytes,Ua: NEGATIVE
Nitrite: NEGATIVE
Protein, ur: NEGATIVE mg/dL
Specific Gravity, Urine: 1.003 — ABNORMAL LOW (ref 1.005–1.030)
pH: 8 (ref 5.0–8.0)

## 2022-04-09 LAB — CBC WITH DIFFERENTIAL/PLATELET
Abs Immature Granulocytes: 0.03 10*3/uL (ref 0.00–0.07)
Basophils Absolute: 0.1 10*3/uL (ref 0.0–0.1)
Basophils Relative: 1 %
Eosinophils Absolute: 0.5 10*3/uL (ref 0.0–0.5)
Eosinophils Relative: 5 %
HCT: 34.2 % — ABNORMAL LOW (ref 36.0–46.0)
Hemoglobin: 11 g/dL — ABNORMAL LOW (ref 12.0–15.0)
Immature Granulocytes: 0 %
Lymphocytes Relative: 32 %
Lymphs Abs: 2.7 10*3/uL (ref 0.7–4.0)
MCH: 29.9 pg (ref 26.0–34.0)
MCHC: 32.2 g/dL (ref 30.0–36.0)
MCV: 92.9 fL (ref 80.0–100.0)
Monocytes Absolute: 0.6 10*3/uL (ref 0.1–1.0)
Monocytes Relative: 7 %
Neutro Abs: 4.6 10*3/uL (ref 1.7–7.7)
Neutrophils Relative %: 55 %
Platelets: 266 10*3/uL (ref 150–400)
RBC: 3.68 MIL/uL — ABNORMAL LOW (ref 3.87–5.11)
RDW: 12.8 % (ref 11.5–15.5)
WBC: 8.5 10*3/uL (ref 4.0–10.5)
nRBC: 0 % (ref 0.0–0.2)

## 2022-04-09 LAB — COMPREHENSIVE METABOLIC PANEL
ALT: 14 U/L (ref 0–44)
AST: 24 U/L (ref 15–41)
Albumin: 3.7 g/dL (ref 3.5–5.0)
Alkaline Phosphatase: 66 U/L (ref 38–126)
Anion gap: 13 (ref 5–15)
BUN: 10 mg/dL (ref 8–23)
CO2: 24 mmol/L (ref 22–32)
Calcium: 9.6 mg/dL (ref 8.9–10.3)
Chloride: 103 mmol/L (ref 98–111)
Creatinine, Ser: 0.78 mg/dL (ref 0.44–1.00)
GFR, Estimated: >60 mL/min (ref >60)
Glucose, Bld: 105 mg/dL — ABNORMAL HIGH (ref 70–99)
Potassium: 3.4 mmol/L — ABNORMAL LOW (ref 3.5–5.1)
Sodium: 140 mmol/L (ref 135–145)
Total Bilirubin: 0.9 mg/dL (ref 0.3–1.2)
Total Protein: 6.5 g/dL (ref 6.5–8.1)

## 2022-04-09 LAB — TROPONIN I (HIGH SENSITIVITY): Troponin I (High Sensitivity): 3 ng/L (ref <18)

## 2022-04-09 LAB — LIPASE, BLOOD: Lipase: 29 U/L (ref 11–51)

## 2022-04-09 LAB — MAGNESIUM: Magnesium: 1.1 mg/dL — ABNORMAL LOW (ref 1.7–2.4)

## 2022-04-09 LAB — TSH: TSH: 2.664 u[IU]/mL (ref 0.350–4.500)

## 2022-04-09 MED ORDER — FENTANYL CITRATE PF 50 MCG/ML IJ SOSY
50.0000 ug | PREFILLED_SYRINGE | Freq: Once | INTRAMUSCULAR | Status: AC
  Administered 2022-04-09: 50 ug via INTRAVENOUS
  Filled 2022-04-09: qty 1

## 2022-04-09 MED ORDER — ONDANSETRON HCL 4 MG/2ML IJ SOLN
4.0000 mg | Freq: Once | INTRAMUSCULAR | Status: AC
Start: 2022-04-09 — End: 2022-04-09
  Administered 2022-04-09: 4 mg via INTRAVENOUS
  Filled 2022-04-09: qty 2

## 2022-04-09 MED ORDER — ONDANSETRON 4 MG PO TBDP: 4.0000 mg | ORAL_TABLET | Freq: Three times a day (TID) | ORAL | 0 refills | Status: DC | PRN

## 2022-04-09 MED ORDER — MAGNESIUM ASPARTATE HCL 615 MG PO TBEC
615.0000 mg | DELAYED_RELEASE_TABLET | Freq: Every day | ORAL | 0 refills | Status: AC
Start: 2022-04-09 — End: 2022-04-19

## 2022-04-09 MED ORDER — IOHEXOL 300 MG/ML  SOLN
100.0000 mL | Freq: Once | INTRAMUSCULAR | Status: AC | PRN
  Administered 2022-04-09: 100 mL via INTRAVENOUS

## 2022-04-09 MED ORDER — MAGNESIUM SULFATE 2 GM/50ML IV SOLN
2.0000 g | Freq: Once | INTRAVENOUS | Status: AC
  Administered 2022-04-09: 2 g via INTRAVENOUS
  Filled 2022-04-09: qty 50

## 2022-04-09 MED ORDER — ONDANSETRON HCL 4 MG/2ML IJ SOLN
4.0000 mg | Freq: Once | INTRAMUSCULAR | Status: DC
Start: 2022-04-09 — End: 2022-04-09

## 2022-04-09 MED ORDER — POTASSIUM CHLORIDE CRYS ER 10 MEQ PO TBCR: 10.0000 meq | EXTENDED_RELEASE_TABLET | Freq: Every day | ORAL | 0 refills | Status: DC

## 2022-04-09 MED ORDER — POTASSIUM CHLORIDE CRYS ER 20 MEQ PO TBCR
40.0000 meq | EXTENDED_RELEASE_TABLET | Freq: Once | ORAL | Status: AC
  Administered 2022-04-09: 40 meq via ORAL
  Filled 2022-04-09: qty 2

## 2022-04-09 NOTE — ED Provider Notes (Signed)
[facility] Provider Note    Event Date/Time   First MD Initiated Contact with Patient 04/09/22 0725     (approximate)   History   Anxiety   HPI  Destiny Savage is a 71 y.o. female with history of hypertension, diabetes, hyperlipidemia, CVA who presents to the emergency department with EMS with complaints of not feeling well today.  States that she in her normal state of health woke up this morning went to the bathroom.  She came back from the bathroom and sat on the edge of the bed and felt very short of breath and felt like her heart was racing.  She states the symptoms lasted for about 10 minutes before they resolve.  She denies any chest pressure, tightness, discomfort or chest pain.  No recent fevers, cough, vomiting, diarrhea.  She states she does have a history of asthma but no wheezing.  No history of CHF, CAD, PE or DVT.  No calf tenderness or calf swelling.   She also complains of having a month of left lower quadrant abdominal pain, nausea, anorexia and weight loss.  She denies vomiting.  She states that her primary care doctor thought that she was constipated but states she is having regular bowel movements but still having pain.  She is passing gas.  She has had a previous hysterectomy and cholecystectomy.    History provided by patient and EMS.    Past Medical History:  Diagnosis Date   Arthritis    Asthma    Diabetes mellitus    GERD (gastroesophageal reflux disease)    Hyperlipidemia    Hypertension    Mini stroke    Stroke [facility])     Past Surgical History:  Procedure Laterality Date   ABDOMINAL HYSTERECTOMY     BREAST EXCISIONAL BIOPSY Left 1980's   benign   CARDIAC CATHETERIZATION     CHOLECYSTECTOMY      MEDICATIONS:  Prior to Admission medications   Medication Sig Start Date End Date Taking? Authorizing Provider  albuterol (VENTOLIN HFA) 108 (90 Base) MCG/ACT inhaler Inhale 1 puff into the lungs every 6 (six) hours as  needed for wheezing.    [provider]  aspirin 81 MG tablet Take 1 tablet (81 mg total) by mouth daily. 01/11/17   Milagros Loll, MD  atorvastatin (LIPITOR) 40 MG tablet TAKE 1 TABLET BY MOUTH ONCE DAILY AT  6PM 11/13/21   Antonieta Iba, MD  Besifloxacin HCl (BESIVANCE) 0.6 % SUSP Besivance 0.6 % eye drops,suspension  INSTILL 1 DROP INTO RIGHT EYE THREE TIMES DAILY AS DIRECTED    [provider]  carbamide peroxide (DEBROX) 6.5 % OTIC solution Place 5 drops into the right ear 2 (two) times daily. 11/19/21   Coralyn Mark, NP  carvedilol (COREG) 6.25 MG tablet Take 1 tablet (6.25 mg total) by mouth 2 (two) times daily with a meal. 03/21/20   Gollan, Tollie Pizza, MD  cholecalciferol (VITAMIN D3) 25 MCG (1000 UNIT) tablet Take 1,000 Units by mouth daily.    [provider]  clopidogrel (PLAVIX) 75 MG tablet Take 1 tablet (75 mg total) by mouth daily. 03/21/20   Antonieta Iba, MD  furosemide (LASIX) 20 MG tablet Take 1 tablet by mouth once daily 01/07/22   Antonieta Iba, MD  HYDROcodone-acetaminophen (NORCO/VICODIN) 5-325 MG tablet Take 1 tablet by mouth every 6 (six) hours as needed for moderate pain. 09/08/21   Sherrie Mustache Roselyn Bering, PA-C  metFORMIN (GLUCOPHAGE-XR) 500  MG 24 hr tablet Take 1,000 mg by mouth daily. 11/23/19   [provider]  methylPREDNISolone (MEDROL DOSEPAK) 4 MG TBPK tablet Take 6 pills on day one then decrease by 1 pill each day 09/08/21   Versie Starks, PA-C  metoCLOPramide (REGLAN) 10 MG tablet Take 1 tablet (10 mg total) by mouth every 8 (eight) hours as needed for nausea. 04/04/22 04/04/23  Arta Silence, MD  nortriptyline (PAMELOR) 10 MG capsule Take 1 capsule (10 mg total) by mouth at bedtime. 05/28/21   Pieter Partridge, DO  omeprazole (PRILOSEC) 20 MG capsule Take 20 mg by mouth daily.    [provider]  ondansetron (ZOFRAN-ODT) 4 MG disintegrating tablet Take 1 tablet (4 mg total) by mouth every 8 (eight) hours as needed.  03/07/21   Fisher, Linden Dolin, PA-C  polyethylene glycol (MIRALAX) 17 g packet Take 17 g by mouth daily. 04/06/21   Nance Pear, MD  potassium chloride (KLOR-CON) 10 MEQ tablet Take 1 tablet by mouth twice daily 11/20/21   Minna Merritts, MD  triamcinolone cream (KENALOG) 0.5 % Apply 1 application topically 2 (two) times daily. 07/13/18   [provider]    Physical Exam   Triage Vital Signs: ED Triage Vitals  Enc Vitals Group     BP 04/09/22 0710 (!) 144/73     Pulse Rate 04/09/22 0710 72     Resp 04/09/22 0710 16     Temp 04/09/22 0710 98.2 F (36.8 C)     Temp Source 04/09/22 0710 Oral     SpO2 04/09/22 0710 100 %     Weight 04/09/22 0711 210 lb (95.3 kg)     Height 04/09/22 0711 5\' 6"  (1.676 m)     Head Circumference --      Peak Flow --      Pain Score 04/09/22 0711 9     Pain Loc --      Pain Edu? --      Excl. in [place]? --     Most recent vital signs: Vitals:   04/09/22 0710  BP: (!) 144/73  Pulse: 72  Resp: 16  Temp: 98.2 F (36.8 C)  SpO2: 100%    CONSTITUTIONAL: Alert and oriented and responds appropriately to questions. Well-appearing; well-nourished, elderly HEAD: Normocephalic, atraumatic EYES: Conjunctivae clear, pupils appear equal, sclera nonicteric ENT: normal nose; moist mucous membranes NECK: Supple, normal ROM CARD: RRR; S1 and S2 appreciated; no murmurs, no clicks, no rubs, no gallops RESP: Normal chest excursion without splinting or tachypnea; breath sounds clear and equal bilaterally; no wheezes, no rhonchi, no rales, no hypoxia or respiratory distress, speaking full sentences ABD/GI: Normal bowel sounds; non-distended; soft, tender to palpation of the left lower quadrant without guarding or rebound BACK: The back appears normal EXT: Normal ROM in all joints; no deformity noted, no edema; no cyanosis, no calf tenderness or calf swelling SKIN: Normal color for age and race; warm; no rash on exposed skin NEURO: Moves all extremities  equally, normal speech PSYCH: The patient's mood and manner are appropriate.   ED Results / Procedures / Treatments   LABS: (all labs ordered are listed, but only abnormal results are displayed) Labs Reviewed  URINALYSIS, ROUTINE W REFLEX MICROSCOPIC - Abnormal; Notable for the following components:      Result Value   Color, Urine STRAW (*)    APPearance CLEAR (*)    Specific Gravity, Urine 1.003 (*)    All other components within normal limits  CBC WITH DIFFERENTIAL/PLATELET  COMPREHENSIVE METABOLIC PANEL  LIPASE, BLOOD  MAGNESIUM  TSH  TROPONIN I (HIGH SENSITIVITY)     EKG:  pending  RADIOLOGY: My personal review and interpretation of imaging:    I have personally reviewed all radiology reports.   No results found.   PROCEDURES:  Critical Care performed: No     .1-3 Lead EKG Interpretation Performed by: Leeyah Heather, Delice Bison, DO Authorized by: Bettyjean Stefanski, Delice Bison, DO     Interpretation: normal     ECG rate:  72   ECG rate assessment: normal     Rhythm: sinus rhythm     Ectopy: none     Conduction: normal      IMPRESSION / MDM / ASSESSMENT AND PLAN / ED COURSE  I reviewed the triage vital signs and the nursing notes.    Patient here with episode of heart racing and shortness of breath.  This lasted about 10 minutes and now has resolved.  Also complaining of a month of left lower quadrant pain with anorexia and nausea.  The patient is on the cardiac monitor to evaluate for evidence of arrhythmia and/or significant heart rate changes.   DIFFERENTIAL DIAGNOSIS (includes but not limited to):   Anemia, electrolyte derangement, thyroid dysfunction, ACS, PE, dissection, pneumonia, pneumothorax, CHF  Diverticulitis, colitis, bowel obstruction, less likely appendicitis, UTI, kidney stone, pyelonephritis, malignancy   PLAN: We will obtain CBC, CMP, lipase, urinalysis, thyroid function, troponin x2, chest x-ray, CT of the abdomen pelvis 1.  We will give pain and  nausea medicine.   MEDICATIONS GIVEN IN ED: Medications  fentaNYL (SUBLIMAZE) injection 50 mcg (50 mcg Intravenous Given 04/09/22 0738)  ondansetron (ZOFRAN) injection 4 mg (4 mg Intravenous Given 04/09/22 0737)     ED COURSE: Patient's work-up pending.  Signed out to oncoming ED physician.   CONSULTS: Dispo once work-up complete.   OUTSIDE RECORDS REVIEWED: Reviewed patient's last office visit with family medicine on 04/02/2022.  MRCP of the abdomen reviewed from 03/12/2022 that showed constipation but no other acute findings.  Pelvic ultrasound from 04/04/2022 reviewed that showed hysterectomy and no other acute abnormality.  Ovaries were not visualized.         FINAL CLINICAL IMPRESSION(S) / ED DIAGNOSES   Final diagnoses:  Palpitations  Shortness of breath  LLQ abdominal pain     Rx / DC Orders   ED Discharge Orders     None        Note:  This document was prepared using Dragon voice recognition software and may include unintentional dictation errors.   Ysela Hettinger, Delice Bison, DO 04/09/22 [phone]

## 2022-04-09 NOTE — ED Notes (Signed)
Patient relayed to MD after going to restroom she became sob. Also c/o chronic left side pain

## 2022-04-09 NOTE — ED Notes (Signed)
Patient transported to CT 

## 2022-04-09 NOTE — ED Notes (Signed)
Contacted lab about running lab orders

## 2022-04-09 NOTE — Discharge Instructions (Signed)
Start the magnesium replacement.  I would also recommend increasing her potassium replacement, and I have given you a short course of increased dose for the next 3 days.  Take the Zofran for your nausea.  For the next several days, I would recommend taking this every 12 hours even if you feel okay, to see if it will help with your baseline nausea.  Try sniffing and isopropyl alcohol pad before meals or when nauseous to help with the initial nauseous response you are getting.  Call to see a GI physician within the next 1 to 2 weeks.

## 2022-04-09 NOTE — ED Triage Notes (Addendum)
Patient reports to this RN "I just didn't feel right this morning. I feel anxious mostly right now" c/o left sided chronic pain. Denies urinary symptoms

## 2022-04-09 NOTE — ED Notes (Signed)
No second troponin needed per MD Erma Heritage

## 2022-04-09 NOTE — ED Provider Notes (Signed)
Patient care assumed.  Briefly, patient is here with transient palpitations versus anxiety.  Patient has had ongoing abdominal pain which has been worked up by her GI physician.  Here, lab work does show moderate hypomagnesemia and hypokalemia which I suspect could be due to her poor p.o. intake.  She has no arrhythmia.  QTc is not significantly prolonged.  This was replaced.  Otherwise, no acute abnormality noted on imaging.  I do long discussion with the patient.  She feels better with electrolyte replacement and would like to continue outpatient work-up.  Do think it is reasonable to give her Zofran for her nausea.  Return precautions given.   Shaune Pollack, MD 04/09/22 1616

## 2022-04-27 ENCOUNTER — Other Ambulatory Visit: Payer: Self-pay | Admitting: Cardiovascular Disease

## 2022-05-18 ENCOUNTER — Other Ambulatory Visit: Payer: Self-pay

## 2022-05-18 ENCOUNTER — Emergency Department
Admission: EM | Admit: 2022-05-18 | Discharge: 2022-05-19 | Disposition: A | Discharge status: Home / Self Care | Payer: Medicare Other | Attending: Emergency Medicine | Admitting: Emergency Medicine

## 2022-05-18 ENCOUNTER — Emergency Department: Payer: Medicare Other

## 2022-05-18 DIAGNOSIS — Z8673 Personal history of transient ischemic attack (TIA), and cerebral infarction without residual deficits: Secondary | ICD-10-CM | POA: Diagnosis not present

## 2022-05-18 DIAGNOSIS — Z8669 Personal history of other diseases of the nervous system and sense organs: Secondary | ICD-10-CM | POA: Insufficient documentation

## 2022-05-18 DIAGNOSIS — E119 Type 2 diabetes mellitus without complications: Secondary | ICD-10-CM | POA: Diagnosis not present

## 2022-05-18 DIAGNOSIS — R519 Headache, unspecified: Secondary | ICD-10-CM | POA: Insufficient documentation

## 2022-05-18 DIAGNOSIS — E785 Hyperlipidemia, unspecified: Secondary | ICD-10-CM | POA: Diagnosis not present

## 2022-05-18 DIAGNOSIS — K219 Gastro-esophageal reflux disease without esophagitis: Secondary | ICD-10-CM | POA: Diagnosis not present

## 2022-05-18 DIAGNOSIS — I10 Essential (primary) hypertension: Secondary | ICD-10-CM | POA: Diagnosis not present

## 2022-05-18 DIAGNOSIS — G43909 Migraine, unspecified, not intractable, without status migrainosus: Secondary | ICD-10-CM

## 2022-05-18 LAB — CBC WITH DIFFERENTIAL/PLATELET
Abs Immature Granulocytes: 0.03 10*3/uL (ref 0.00–0.07)
Basophils Absolute: 0 10*3/uL (ref 0.0–0.1)
Basophils Relative: 1 %
Eosinophils Absolute: 0.4 10*3/uL (ref 0.0–0.5)
Eosinophils Relative: 5 %
HCT: 37 % (ref 36.0–46.0)
Hemoglobin: 11.8 g/dL — ABNORMAL LOW (ref 12.0–15.0)
Immature Granulocytes: 0 %
Lymphocytes Relative: 34 %
Lymphs Abs: 2.7 10*3/uL (ref 0.7–4.0)
MCH: 29.7 pg (ref 26.0–34.0)
MCHC: 31.9 g/dL (ref 30.0–36.0)
MCV: 93.2 fL (ref 80.0–100.0)
Monocytes Absolute: 0.6 10*3/uL (ref 0.1–1.0)
Monocytes Relative: 8 %
Neutro Abs: 4.4 10*3/uL (ref 1.7–7.7)
Neutrophils Relative %: 52 %
Platelets: 290 10*3/uL (ref 150–400)
RBC: 3.97 MIL/uL (ref 3.87–5.11)
RDW: 12.7 % (ref 11.5–15.5)
WBC: 8.2 10*3/uL (ref 4.0–10.5)
nRBC: 0 % (ref 0.0–0.2)

## 2022-05-18 LAB — COMPREHENSIVE METABOLIC PANEL
ALT: 35 U/L (ref 0–44)
AST: 38 U/L (ref 15–41)
Albumin: 4.2 g/dL (ref 3.5–5.0)
Alkaline Phosphatase: 83 U/L (ref 38–126)
Anion gap: 7 (ref 5–15)
BUN: 9 mg/dL (ref 8–23)
CO2: 25 mmol/L (ref 22–32)
Calcium: 9.6 mg/dL (ref 8.9–10.3)
Chloride: 101 mmol/L (ref 98–111)
Creatinine, Ser: 0.81 mg/dL (ref 0.44–1.00)
GFR, Estimated: >60 mL/min (ref >60)
Glucose, Bld: 134 mg/dL — ABNORMAL HIGH (ref 70–99)
Potassium: 3.8 mmol/L (ref 3.5–5.1)
Sodium: 133 mmol/L — ABNORMAL LOW (ref 135–145)
Total Bilirubin: 0.5 mg/dL (ref 0.3–1.2)
Total Protein: 7.3 g/dL (ref 6.5–8.1)

## 2022-05-18 LAB — APTT: aPTT: 33 seconds (ref 24–36)

## 2022-05-18 LAB — PROTIME-INR
INR: 1 (ref 0.8–1.2)
Prothrombin Time: 12.7 seconds (ref 11.4–15.2)

## 2022-05-18 MED ORDER — KETOROLAC TROMETHAMINE 30 MG/ML IJ SOLN
15.0000 mg | Freq: Once | INTRAMUSCULAR | Status: AC
  Administered 2022-05-18: 15 mg via INTRAVENOUS
  Filled 2022-05-18: qty 1

## 2022-05-18 MED ORDER — METOCLOPRAMIDE HCL 5 MG/ML IJ SOLN
10.0000 mg | Freq: Once | INTRAMUSCULAR | Status: AC
Start: 2022-05-18 — End: 2022-05-18
  Administered 2022-05-18: 10 mg via INTRAVENOUS
  Filled 2022-05-18: qty 2

## 2022-05-18 MED ORDER — DIPHENHYDRAMINE HCL 50 MG/ML IJ SOLN
50.0000 mg | Freq: Once | INTRAMUSCULAR | Status: AC
  Administered 2022-05-18: 50 mg via INTRAVENOUS
  Filled 2022-05-18: qty 1

## 2022-05-18 NOTE — ED Triage Notes (Addendum)
PT arrives today with generalized frontal headache since this morning. PT denies taking anything for pain today.   PT stating she feels alert and orients, husband stating pt does not appear confused. Pt alert and oriented x4.   PT stating PMH stroke in the past. LKW last night.

## 2022-05-19 MED ORDER — DOCUSATE SODIUM 100 MG PO CAPS: 100.0000 mg | ORAL_CAPSULE | Freq: Two times a day (BID) | ORAL | 2 refills | Status: DC

## 2022-05-19 NOTE — ED Provider Notes (Signed)
[facility] Provider Note    Event Date/Time   First MD Initiated Contact with Patient 05/18/22 2127     (approximate)  History   Chief Complaint: Headache  HPI  Destiny Savage is a 71 y.o. female with a past medical history of diabetes, gastric reflux, hypertension, hyperlipidemia, prior CVA, presents to the emergency department for headache.  According to the patient for the past week or so she has been experiencing a frontal headache on both sides.  Patient states she has taken Tylenol for the headache but has not helped.  Patient states she has had headaches and migraines in the past.  Denies any weakness or numbness of any arm or leg confusion or difficulty speaking.  Physical Exam   Triage Vital Signs: ED Triage Vitals  Enc Vitals Group     BP 05/18/22 2102 (!) 198/93     Pulse Rate 05/18/22 2102 69     Resp 05/18/22 2102 12     Temp 05/18/22 2102 98.6 F (37 C)     Temp src --      SpO2 05/18/22 2102 100 %     Weight 05/18/22 2104 204 lb (92.5 kg)     Height --      Head Circumference --      Peak Flow --      Pain Score 05/18/22 2103 10     Pain Loc --      Pain Edu? --      Excl. in [place]? --     Most recent vital signs: Vitals:   05/18/22 2230 05/18/22 2330  BP: (!) 161/83 (!) 151/70  Pulse: 74 70  Resp: 11 15  Temp:    SpO2: 100% 100%    General: Awake, no distress.  CV:  Good peripheral perfusion.  Regular rate and rhythm  Resp:  Normal effort.  Equal breath sounds bilaterally.  Abd:  No distention.  Soft, nontender.  No rebound or guarding. Other:  Equal grip strengths.  No pronator drift.  Cranial nerves intact.     ED Results / Procedures / Treatments   EKG  EKG viewed and interpreted by myself shows a normal sinus rhythm at 67 bpm with a narrow QRS, normal axis, normal intervals, no concerning ST changes.  RADIOLOGY  I have personally interpreted the CT images of the head I do not see any acute bleed on my  evaluation.   CT scan of the head is negative per radiology.   MEDICATIONS ORDERED IN ED: Medications  ketorolac (TORADOL) 30 MG/ML injection 15 mg (15 mg Intravenous Given 05/18/22 2220)  diphenhydrAMINE (BENADRYL) injection 50 mg (50 mg Intravenous Given 05/18/22 2221)  metoCLOPramide (REGLAN) injection 10 mg (10 mg Intravenous Given 05/18/22 2223)     IMPRESSION / MDM / ASSESSMENT AND PLAN / ED COURSE  I reviewed the triage vital signs and the nursing notes.  Patient's presentation is most consistent with acute presentation with potential threat to life or bodily function.  Patient presents to the emergency department for headache x1 week.  Overall the patient appears well, no distress.  No acute neurologic findings.  CT scan is negative.  CBC is normal.  Chemistry is normal.  INR is normal.  Patient states he is feeling much better after CT imaging  CT scan of the head is negative for acute abnormality.  Lab work is normal.  Overall the patient appears well, no distress.  Patient is feeling better after medications.  We will discharge patient with neurology follow-up.  Patient agreeable to plan of care.  As a secondary complaint patient states she is feeling constipated we will place patient on [facility] as well.  FINAL CLINICAL IMPRESSION(S) / ED DIAGNOSES   Headache    Note:  This document was prepared using Dragon voice recognition software and may include unintentional dictation errors.   Minna Antis, MD 05/19/22 0020

## 2022-05-19 NOTE — ED Provider Notes (Incomplete)
   [facility] Provider Note    Event Date/Time   First MD Initiated Contact with Patient 05/18/22 2127     (approximate)  History   Chief Complaint: Headache  HPI  ELODY KLEINSASSER is a 71 y.o. female with a past medical history of diabetes, gastric reflux, hypertension, hyperlipidemia, prior CVA, presents to the emergency department for headache.  According to the patient for the past week or so she has been experiencing a frontal headache on both sides.  Patient states she has taken Tylenol for the headache but has not helped.  Patient states she has had headaches and migraines in the past.  Denies any weakness or numbness of any arm or leg confusion or difficulty speaking.  Physical Exam   Triage Vital Signs: ED Triage Vitals  Enc Vitals Group     BP 05/18/22 2102 (!) 198/93     Pulse Rate 05/18/22 2102 69     Resp 05/18/22 2102 12     Temp 05/18/22 2102 98.6 F (37 C)     Temp src --      SpO2 05/18/22 2102 100 %     Weight 05/18/22 2104 204 lb (92.5 kg)     Height --      Head Circumference --      Peak Flow --      Pain Score 05/18/22 2103 10     Pain Loc --      Pain Edu? --      Excl. in [place]? --     Most recent vital signs: Vitals:   05/18/22 2230 05/18/22 2330  BP: (!) 161/83 (!) 151/70  Pulse: 74 70  Resp: 11 15  Temp:    SpO2: 100% 100%    General: Awake, no distress.  CV:  Good peripheral perfusion.  Regular rate and rhythm  Resp:  Normal effort.  Equal breath sounds bilaterally.  Abd:  No distention.  Soft, nontender.  No rebound or guarding. Other:  Equal grip strengths.  No pronator drift.  Cranial nerves intact.     ED Results / Procedures / Treatments   EKG  ***  RADIOLOGY  ***   MEDICATIONS ORDERED IN ED: Medications  ketorolac (TORADOL) 30 MG/ML injection 15 mg (15 mg Intravenous Given 05/18/22 2220)  diphenhydrAMINE (BENADRYL) injection 50 mg (50 mg Intravenous Given 05/18/22 2221)  metoCLOPramide (REGLAN)  injection 10 mg (10 mg Intravenous Given 05/18/22 2223)     IMPRESSION / MDM / ASSESSMENT AND PLAN / ED COURSE  I reviewed the triage vital signs and the nursing notes.  Patient's presentation is most consistent with {EM COPA:27473}  ***  FINAL CLINICAL IMPRESSION(S) / ED DIAGNOSES   ***  Rx / DC Orders   ***  Note:  This document was prepared using Dragon voice recognition software and may include unintentional dictation errors.

## 2022-05-20 ENCOUNTER — Emergency Department: Payer: Medicare Other

## 2022-05-20 ENCOUNTER — Other Ambulatory Visit: Payer: Self-pay

## 2022-05-20 DIAGNOSIS — R1032 Left lower quadrant pain: Secondary | ICD-10-CM | POA: Insufficient documentation

## 2022-05-20 DIAGNOSIS — I10 Essential (primary) hypertension: Secondary | ICD-10-CM | POA: Diagnosis not present

## 2022-05-20 DIAGNOSIS — J45909 Unspecified asthma, uncomplicated: Secondary | ICD-10-CM | POA: Insufficient documentation

## 2022-05-20 DIAGNOSIS — E119 Type 2 diabetes mellitus without complications: Secondary | ICD-10-CM | POA: Diagnosis not present

## 2022-05-20 DIAGNOSIS — M545 Low back pain, unspecified: Secondary | ICD-10-CM | POA: Insufficient documentation

## 2022-05-20 LAB — URINALYSIS, ROUTINE W REFLEX MICROSCOPIC
Bilirubin Urine: NEGATIVE
Glucose, UA: NEGATIVE mg/dL
Ketones, ur: NEGATIVE mg/dL
Leukocytes,Ua: NEGATIVE
Nitrite: NEGATIVE
Protein, ur: NEGATIVE mg/dL
Specific Gravity, Urine: 1.011 (ref 1.005–1.030)
pH: 7 (ref 5.0–8.0)

## 2022-05-20 LAB — CBC
HCT: 38.6 % (ref 36.0–46.0)
Hemoglobin: 12.3 g/dL (ref 12.0–15.0)
MCH: 29.6 pg (ref 26.0–34.0)
MCHC: 31.9 g/dL (ref 30.0–36.0)
MCV: 92.8 fL (ref 80.0–100.0)
Platelets: 315 10*3/uL (ref 150–400)
RBC: 4.16 MIL/uL (ref 3.87–5.11)
RDW: 12.6 % (ref 11.5–15.5)
WBC: 9 10*3/uL (ref 4.0–10.5)
nRBC: 0 % (ref 0.0–0.2)

## 2022-05-20 LAB — BASIC METABOLIC PANEL
Anion gap: 10 (ref 5–15)
BUN: 9 mg/dL (ref 8–23)
CO2: 25 mmol/L (ref 22–32)
Calcium: 10.4 mg/dL — ABNORMAL HIGH (ref 8.9–10.3)
Chloride: 102 mmol/L (ref 98–111)
Creatinine, Ser: 0.87 mg/dL (ref 0.44–1.00)
GFR, Estimated: >60 mL/min (ref >60)
Glucose, Bld: 106 mg/dL — ABNORMAL HIGH (ref 70–99)
Potassium: 4.2 mmol/L (ref 3.5–5.1)
Sodium: 137 mmol/L (ref 135–145)

## 2022-05-20 MED ORDER — OXYCODONE-ACETAMINOPHEN 5-325 MG PO TABS
1.0000 | ORAL_TABLET | ORAL | Status: DC | PRN
  Administered 2022-05-20: 1 via ORAL
  Filled 2022-05-20: qty 1

## 2022-05-20 NOTE — ED Triage Notes (Signed)
Pt presents to ER from home c/o right side flank pain with radiation to RLQ/right groin area.  Pt states pain started today and got much worse tonight.  Pt states pain comes in waves.  Pt denies urinary symptoms, denies hx of kidney stones.  Pt is A&O x4 at this time in NAD in triage.  Pt appears uncomfortable.

## 2022-05-21 ENCOUNTER — Emergency Department
Admission: EM | Admit: 2022-05-21 | Discharge: 2022-05-21 | Disposition: A | Discharge status: Home / Self Care | Payer: Medicare Other | Attending: Emergency Medicine | Admitting: Emergency Medicine

## 2022-05-21 DIAGNOSIS — R1032 Left lower quadrant pain: Secondary | ICD-10-CM

## 2022-05-21 DIAGNOSIS — M545 Low back pain, unspecified: Secondary | ICD-10-CM

## 2022-05-21 MED ORDER — IBUPROFEN 400 MG PO TABS
400.0000 mg | ORAL_TABLET | Freq: Once | ORAL | Status: AC
  Administered 2022-05-21: 400 mg via ORAL
  Filled 2022-05-21: qty 1

## 2022-05-21 NOTE — ED Notes (Signed)
Pt DC to home. DC instructions reviewed with all questions answered. Pt verbalizes understanding. Pt assisted out of dept via wheelchair by staff with family member

## 2022-05-21 NOTE — ED Provider Notes (Signed)
[facility] Provider Note    Event Date/Time   First MD Initiated Contact with Patient 05/21/22 [phone]     (approximate)   History   Flank Pain   HPI  Destiny Savage is a 71 y.o. female past medical history of diabetes asthma GERD CVA presents with left lower quadrant abdominal pain and back pain.  Patient notes abdominal pain is been going on for months.  It is in the left lower quadrant suprapubic region she feels it daily occasionally it will feel worse then other times.  It feels like a pulling sensation.  She also endorses pain in the left low back which is also been an ongoing issue.  Felt worse today.  She denies any injury.  No bowel bladder incontinence.  No numbness tingling weakness in her legs.  Denies dysuria or hematuria.  Patient has seen her primary care doctor about her abdominal pain.  She had an MRI of her abdomen that showed constipation.  I see that she was referred to GI her primary doctor in May to have an EGD and colonoscopy and an ovarian ultrasound was also been ordered.  Patient tells me that she had a recent colonoscopy that was normal.  Has not had fevers or chills.    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   Stroke (HCC) 01/10/2017   Mixed hyperlipidemia 11/21/2016   Neck pain 11/21/2016   Chest pain 12/23/2015   GERD (gastroesophageal reflux disease) 12/23/2015   Abdominal pain 04/30/2013   Vertigo 05/11/2012   Hypertension 05/11/2012   Diabetes type 2, controlled (HCC) 05/11/2012   Neck pain on right side 05/11/2012     Physical Exam  Triage Vital Signs: ED Triage Vitals  Enc Vitals Group     BP 05/20/22 2304 (!) 197/85      Pulse Rate 05/20/22 2304 72     Resp 05/20/22 2304 18     Temp 05/20/22 2304 98.8 F (37.1 C)     Temp Source 05/20/22 2304 Oral     SpO2 05/20/22 2304 100 %     Weight --      Height --      Head Circumference --      Peak Flow --      Pain Score 05/20/22 2305 10     Pain Loc --      Pain Edu? --      Excl. in GC? --     Most recent vital signs: Vitals:   05/20/22 2304 05/21/22 0509  BP: (!) 197/85 (!) 148/71  Pulse: 72 66  Resp: 18 17  Temp: 98.8 F (37.1 C)   SpO2: 100% 95%     General: Awake, no distress.  CV:  Good peripheral perfusion.  Resp:  Normal effort.  Abd:  No distention.  Mild tenderness to palpation in the left lower quadrant no guarding Neuro:             Awake, Alert, Oriented x 3  Other:  No midline lumbar tenderness, tenderness over the left SI joint 5 out of 5 strength with hip flexion plantarflexion dorsiflexion  Patient is grossly intact in the bilateral lower extremities   ED Results / Procedures / Treatments  Labs (all labs ordered are listed, but only abnormal results are displayed) Labs Reviewed  BASIC METABOLIC PANEL - Abnormal; Notable for the following components:      Result Value   Glucose, Bld 106 (*)    Calcium 10.4 (*)    All other components within normal limits  URINALYSIS, ROUTINE W REFLEX MICROSCOPIC - Abnormal; Notable for the following components:   Color, Urine YELLOW (*)    APPearance CLEAR (*)    Hgb urine dipstick SMALL (*)    Bacteria, UA MANY (*)    All other components within normal limits  CBC     EKG     RADIOLOGY I reviewed the CT renal study which is negative for stone or acute intra-abdominal process   PROCEDURES:  Critical Care performed: No  Procedures .   MEDICATIONS ORDERED IN ED: Medications  oxyCODONE-acetaminophen (PERCOCET/ROXICET) 5-325 MG per tablet 1 tablet (1 tablet Oral Given 05/20/22 2312)  ibuprofen (ADVIL) tablet 400 mg (has no administration in time range)      IMPRESSION / MDM / ASSESSMENT AND PLAN / ED COURSE  I reviewed the triage vital signs and the nursing notes.                              Patient's presentation is most consistent with acute presentation with potential threat to life or bodily function.  Differential diagnosis includes, but is not limited to, diverticulitis, colon malignancy, kidney stone, pyelonephritis, referred pain from lumbar radiculopathy or osteoarthritis of the left hip  Patient is a 71 year old female presenting with low back pain and left lower quadrant abdominal pain.  This has been an ongoing issue for her but seem to be worse over the last day or so.  Pain is in the left lower quadrant suprapubic region feels like a pulling feeling she also has pain in the left low back she is unsure if these are the same or different pains.  She has no red flag symptoms in terms of her back pain to suggest cauda equina syndrome including no bowel bladder incontinence fevers numbness tingling in the leg and her strength and sensory exam in her extremities is normal.  She has no urinary symptoms fevers chills has had ongoing nausea but no vomiting.  Does struggle with constipation and had an MRI in April that did show constipation.  Abdominal exam is notable for mild tenderness in the left lower quadrant.  Her labs are overall reassuring she has no leukocytosis, renal function is normal.  She just mildly hypercalcemic.  UA without hematuria or pyuria.  CT renal study was ordered from triage and this did not show any acute pathology.  And overall reassured by the chronicity and the work-up today.  I wonder whether her pain is referred from the hip or the low back recommended ibuprofen and Tylenol.  She is already seeing orthopedics in regards to the hip I recommend she discuss her low back pain with them as well.  She is also been referred to GI and I do think she should have a colonoscopy as she could be having referred pain from a  colonic malignancy that we would not see on CT.         FINAL CLINICAL IMPRESSION(S) / ED DIAGNOSES   Final diagnoses:  Left lower quadrant abdominal pain  Left  low back pain, unspecified chronicity, unspecified whether sciatica present     Rx / DC Orders   ED Discharge Orders     None        Note:  This document was prepared using Dragon voice recognition software and may include unintentional dictation errors.   Georga Hacking, MD 05/21/22 [phone]

## 2022-05-21 NOTE — Discharge Instructions (Addendum)
Your CT scan did not show any abnormality in your abdomen or pelvis that would explain your pain. Your blood work and urine sample were reassuring. It is possible that your pain is referred from your hip or low back. Take 400 mg of ibuprofen every 6 hours and 1g of tylenol every 8 hours for pain. Please follow-up with Orthopedics as scheduled.

## 2022-05-24 ENCOUNTER — Encounter: Payer: Self-pay | Admitting: Intensive Care

## 2022-05-24 ENCOUNTER — Emergency Department
Admission: EM | Admit: 2022-05-24 | Discharge: 2022-05-24 | Disposition: A | Discharge status: Home / Self Care | Payer: Medicare Other | Attending: Emergency Medicine | Admitting: Emergency Medicine

## 2022-05-24 ENCOUNTER — Other Ambulatory Visit: Payer: Self-pay

## 2022-05-24 DIAGNOSIS — Z8673 Personal history of transient ischemic attack (TIA), and cerebral infarction without residual deficits: Secondary | ICD-10-CM | POA: Diagnosis not present

## 2022-05-24 DIAGNOSIS — E119 Type 2 diabetes mellitus without complications: Secondary | ICD-10-CM | POA: Insufficient documentation

## 2022-05-24 DIAGNOSIS — K5900 Constipation, unspecified: Secondary | ICD-10-CM | POA: Insufficient documentation

## 2022-05-24 DIAGNOSIS — J45909 Unspecified asthma, uncomplicated: Secondary | ICD-10-CM | POA: Diagnosis not present

## 2022-05-24 DIAGNOSIS — R1032 Left lower quadrant pain: Secondary | ICD-10-CM

## 2022-05-24 DIAGNOSIS — I10 Essential (primary) hypertension: Secondary | ICD-10-CM | POA: Insufficient documentation

## 2022-05-24 LAB — CBC
HCT: 38.2 % (ref 36.0–46.0)
Hemoglobin: 12.4 g/dL (ref 12.0–15.0)
MCH: 29.5 pg (ref 26.0–34.0)
MCHC: 32.5 g/dL (ref 30.0–36.0)
MCV: 91 fL (ref 80.0–100.0)
Platelets: 291 10*3/uL (ref 150–400)
RBC: 4.2 MIL/uL (ref 3.87–5.11)
RDW: 12.6 % (ref 11.5–15.5)
WBC: 8.3 10*3/uL (ref 4.0–10.5)
nRBC: 0 % (ref 0.0–0.2)

## 2022-05-24 LAB — LIPASE, BLOOD: Lipase: 113 U/L — ABNORMAL HIGH (ref 11–51)

## 2022-05-24 LAB — COMPREHENSIVE METABOLIC PANEL
ALT: 30 U/L (ref 0–44)
AST: 33 U/L (ref 15–41)
Albumin: 4.3 g/dL (ref 3.5–5.0)
Alkaline Phosphatase: 71 U/L (ref 38–126)
Anion gap: 10 (ref 5–15)
BUN: 12 mg/dL (ref 8–23)
CO2: 26 mmol/L (ref 22–32)
Calcium: 9.8 mg/dL (ref 8.9–10.3)
Chloride: 101 mmol/L (ref 98–111)
Creatinine, Ser: 1.04 mg/dL — ABNORMAL HIGH (ref 0.44–1.00)
GFR, Estimated: 58 mL/min — ABNORMAL LOW (ref >60)
Glucose, Bld: 163 mg/dL — ABNORMAL HIGH (ref 70–99)
Potassium: 4 mmol/L (ref 3.5–5.1)
Sodium: 137 mmol/L (ref 135–145)
Total Bilirubin: 0.8 mg/dL (ref 0.3–1.2)
Total Protein: 7.4 g/dL (ref 6.5–8.1)

## 2022-05-24 MED ORDER — LACTULOSE 10 GM/15ML PO SOLN
30.0000 g | Freq: Once | ORAL | Status: AC
  Administered 2022-05-24: 30 g via ORAL
  Filled 2022-05-24: qty 60

## 2022-05-24 MED ORDER — FLEET ENEMA 7-19 GM/118ML RE ENEM
1.0000 | ENEMA | Freq: Once | RECTAL | Status: AC
  Administered 2022-05-24: 1 via RECTAL

## 2022-05-24 MED ORDER — LACTULOSE 20 G PO PACK: 20.0000 g | PACK | Freq: Every day | ORAL | 0 refills | Status: AC

## 2022-05-24 NOTE — ED Triage Notes (Signed)
Patient c/o abdominal pain. Has been seen twice this week for same. Here on 05/21/22 and 05/22/22 at [facility] where she was given enema. Reports some output but still experiencing pain and nausea.

## 2022-05-24 NOTE — ED Provider Notes (Signed)
[facility] Provider Note    Event Date/Time   First MD Initiated Contact with Patient 05/24/22 1743     (approximate)   History   Abdominal Pain   HPI  Destiny Savage is a 71 y.o. female past medical history of CVA diabetes hypertension who presents with abdominal pain.  Patient has ongoing left lower quadrant abdominal pain for some time.  Seen in the ED in May with CT abdomen/pelvis that showed large burden of stool then seen by myself 4 days ago with CT renal study that was negative for acute pathology and labs are overall reassuring.  Seen again the next day at outside hospital ED on 6/5 where she had an abdominal x-ray that showed a large stool burden.  She was given enema initially felt better but then the next day started having the left lower quadrant abdominal pain again.  Is the point that she has pain laying on that side.  Also has chronic left hip pain.  No numbness tingling weakness no urinary symptoms no fevers chills has had nausea but no vomiting.  She sees GI on Monday.  Has been taking MiraLAX twice daily with not much relief.  Prior to the enema she thinks her last BM before that was about a week or 2 before.    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   Stroke (HCC) 01/10/2017   Mixed hyperlipidemia 11/21/2016   Neck pain 11/21/2016   Chest pain 12/23/2015   GERD (gastroesophageal reflux disease) 12/23/2015   Abdominal pain 04/30/2013   Vertigo 05/11/2012   Hypertension 05/11/2012   Diabetes type 2, controlled (HCC) 05/11/2012   Neck pain on right side 05/11/2012     Physical Exam  Triage Vital Signs: ED Triage Vitals  Enc Vitals  Group     BP 05/24/22 1436 133/78     Pulse Rate 05/24/22 1436 71     Resp 05/24/22 1436 18     Temp 05/24/22 1436 98.3 F (36.8 C)     Temp Source 05/24/22 1436 Oral     SpO2 05/24/22 1436 98 %     Weight 05/24/22 1439 201 lb (91.2 kg)     Height 05/24/22 1439 5\' 5"  (1.651 m)     Head Circumference --      Peak Flow --      Pain Score 05/24/22 1438 10     Pain Loc --      Pain Edu? --      Excl. in GC? --     Most recent vital signs: Vitals:   05/24/22 1436 05/24/22 1815  BP: 133/78 (!) 148/80  Pulse: 71 60  Resp: 18 18  Temp: 98.3 F (36.8 C)   SpO2: 98% 99%     General: Awake, no distress.  CV:  Good peripheral perfusion.  Resp:  Normal effort.  Abd:  No distention.  Mild tenderness in the left lower quadrant no palpable masses no guarding Neuro:             Awake, Alert, Oriented x 3  Other:  Patient able to range the left  hip without significant pain   ED Results / Procedures / Treatments  Labs (all labs ordered are listed, but only abnormal results are displayed) Labs Reviewed  LIPASE, BLOOD - Abnormal; Notable for the following components:      Result Value   Lipase 113 (*)    All other components within normal limits  COMPREHENSIVE METABOLIC PANEL - Abnormal; Notable for the following components:   Glucose, Bld 163 (*)    Creatinine, Ser 1.04 (*)    GFR, Estimated 58 (*)    All other components within normal limits  CBC  URINALYSIS, ROUTINE W REFLEX MICROSCOPIC     EKG    RADIOLOGY    PROCEDURES:  Critical Care performed: No  Procedures    MEDICATIONS ORDERED IN ED: Medications  lactulose (CHRONULAC) 10 GM/15ML solution 30 g (has no administration in time range)  sodium phosphate (FLEET) 7-19 GM/118ML enema 1 enema (has no administration in time range)     IMPRESSION / MDM / ASSESSMENT AND PLAN / ED COURSE  I reviewed the triage vital signs and the nursing notes.                              Patient's presentation is most  consistent with acute presentation with potential threat to life or bodily function.  Differential diagnosis includes, but is not limited to, constipation related diverticulitis, bowel obstruction, diverticulitis, UTI, musculoskeletal pain, colonic malignancy  The patient is a 2-year-old female presenting with chronic left lower quadrant pain.  Patient's been seen multiple times recently for similar.  Last cross-sectional imaging without contrast was 4 days ago.  Pain has been similar unchanged in quality.  His left lower quadrant also feels bloated and is constipated having bowel movements very infrequently 1 to 2 weeks at a time.  Was seen in an outside hospital ED 2 days ago and had an enema initially felt better but pain then returned.  She still eating but has nausea but no vomiting.  No fevers chills.  Her labs today overall reassuring.  Lipase is somewhat higher than it has been at 113 however she does not have any other symptoms consistent with pancreatitis her pain is in the left lower quadrant at the epigastric region my suspicion for pancreatitis is low and also the lipase is not 3 times upper limit of normal.  LFTs are otherwise normal.  Creatinine slightly bumped but not significantly.  Abdominal exam overall is benign.  Feel that cross-sectional imaging again today would be of low utility especially as pain is unchanged and there were no acute findings 4 days ago.  Patient has been taking low dose of MiraLAX.  We will give an enema for her to take home.  Advise increasing dose of MiraLAX and will also add on lactulose.  I am reassured she has follow-up GI Monday several I think she needs a repeat colonoscopy to ensure there is no malignancy given the change in bowel habits.       FINAL CLINICAL IMPRESSION(S) / ED DIAGNOSES   Final diagnoses:  Left lower quadrant abdominal pain  Constipation, unspecified constipation type     Rx / DC Orders   ED Discharge Orders          Ordered     lactulose (CEPHULAC) 20 g packet  Daily        05/24/22 1816             Note:  This document was prepared using Dragon voice recognition software and may include unintentional dictation errors.   Georga Hacking, MD 05/24/22 [phone]

## 2022-05-24 NOTE — ED Notes (Signed)
Pt given urine cup and wipe and instructed on use for sample.

## 2022-05-24 NOTE — Discharge Instructions (Addendum)
Please start taking the lactulose daily.  You can continue to take the MiraLAX.  Please take the enema tonight.  Please keep your appointment with GI on Monday.

## 2022-05-25 ENCOUNTER — Emergency Department: Payer: Medicare Other

## 2022-05-25 ENCOUNTER — Other Ambulatory Visit: Payer: Self-pay

## 2022-05-25 ENCOUNTER — Emergency Department
Admission: EM | Admit: 2022-05-25 | Discharge: 2022-05-25 | Disposition: A | Discharge status: Home / Self Care | Payer: Medicare Other | Attending: Emergency Medicine | Admitting: Emergency Medicine

## 2022-05-25 DIAGNOSIS — E119 Type 2 diabetes mellitus without complications: Secondary | ICD-10-CM | POA: Diagnosis not present

## 2022-05-25 DIAGNOSIS — R1032 Left lower quadrant pain: Secondary | ICD-10-CM | POA: Diagnosis not present

## 2022-05-25 DIAGNOSIS — R748 Abnormal levels of other serum enzymes: Secondary | ICD-10-CM | POA: Insufficient documentation

## 2022-05-25 DIAGNOSIS — I10 Essential (primary) hypertension: Secondary | ICD-10-CM | POA: Insufficient documentation

## 2022-05-25 DIAGNOSIS — G8929 Other chronic pain: Secondary | ICD-10-CM

## 2022-05-25 LAB — COMPREHENSIVE METABOLIC PANEL
ALT: 32 U/L (ref 0–44)
AST: 34 U/L (ref 15–41)
Albumin: 4.5 g/dL (ref 3.5–5.0)
Alkaline Phosphatase: 74 U/L (ref 38–126)
Anion gap: 11 (ref 5–15)
BUN: 12 mg/dL (ref 8–23)
CO2: 22 mmol/L (ref 22–32)
Calcium: 9.8 mg/dL (ref 8.9–10.3)
Chloride: 100 mmol/L (ref 98–111)
Creatinine, Ser: 0.96 mg/dL (ref 0.44–1.00)
GFR, Estimated: >60 mL/min (ref >60)
Glucose, Bld: 149 mg/dL — ABNORMAL HIGH (ref 70–99)
Potassium: 3.6 mmol/L (ref 3.5–5.1)
Sodium: 133 mmol/L — ABNORMAL LOW (ref 135–145)
Total Bilirubin: 0.6 mg/dL (ref 0.3–1.2)
Total Protein: 7.9 g/dL (ref 6.5–8.1)

## 2022-05-25 LAB — URINALYSIS, ROUTINE W REFLEX MICROSCOPIC
Bilirubin Urine: NEGATIVE
Glucose, UA: NEGATIVE mg/dL
Hgb urine dipstick: NEGATIVE
Ketones, ur: NEGATIVE mg/dL
Leukocytes,Ua: NEGATIVE
Nitrite: NEGATIVE
Protein, ur: NEGATIVE mg/dL
Specific Gravity, Urine: 1.017 (ref 1.005–1.030)
pH: 5 (ref 5.0–8.0)

## 2022-05-25 LAB — CBC
HCT: 38 % (ref 36.0–46.0)
Hemoglobin: 12.4 g/dL (ref 12.0–15.0)
MCH: 29.7 pg (ref 26.0–34.0)
MCHC: 32.6 g/dL (ref 30.0–36.0)
MCV: 91.1 fL (ref 80.0–100.0)
Platelets: 303 10*3/uL (ref 150–400)
RBC: 4.17 MIL/uL (ref 3.87–5.11)
RDW: 12.5 % (ref 11.5–15.5)
WBC: 10.9 10*3/uL — ABNORMAL HIGH (ref 4.0–10.5)
nRBC: 0 % (ref 0.0–0.2)

## 2022-05-25 LAB — TROPONIN I (HIGH SENSITIVITY)
Troponin I (High Sensitivity): 5 ng/L (ref <18)
Troponin I (High Sensitivity): 4 ng/L (ref <18)

## 2022-05-25 LAB — LIPASE, BLOOD: Lipase: 197 U/L — ABNORMAL HIGH (ref 11–51)

## 2022-05-25 MED ORDER — IOHEXOL 300 MG/ML  SOLN
100.0000 mL | Freq: Once | INTRAMUSCULAR | Status: AC | PRN
Start: 2022-05-25 — End: 2022-05-25
  Administered 2022-05-25: 100 mL via INTRAVENOUS

## 2022-05-25 NOTE — ED Triage Notes (Signed)
Pt complains of low back pain and chest pain that began this am. Pt denies shob, dizziness.

## 2022-05-25 NOTE — Progress Notes (Signed)
  Transition of Care [facility]) Screening Note   Patient Details  Name: Destiny Savage Date of Birth: April 24, 1951   Transition of Care [facility] CM/SW Contact:    Gildardo Griffes, LCSW Phone Number: 05/25/2022, 9:28 AM    Transition of Care Department [facility]) has reviewed patient and no TOC needs have been identified at this time. We will continue to monitor patient advancement through interdisciplinary progression rounds. If new patient transition needs arise, please place a TOC consult.  [place], [place] [phone]

## 2022-05-25 NOTE — ED Provider Notes (Signed)
[facility] Provider Note    Event Date/Time   First MD Initiated Contact with Patient 05/25/22 [phone]     (approximate)   History   Back Pain and Chest Pain   HPI  Destiny Savage is a 71 y.o. female with history of CVA, diabetes, and hypertension who presents with abdominal pain.  The patient has had somewhat chronic mainly left lower abdominal pain for some time and has had multiple ED evaluations.  She was most recently seen yesterday evening and had reassuring labs and resolved pain.  The pain was thought to overall be due to constipation and high stool burden.  However, the patient states that early this morning she started having more severe pain in the right lower quadrant radiating around to the right side of her back which is a new pain for her and more severe than the pain she has had previously.  She states that it is starting to subside on its own.  She has no associated nausea or vomiting.  She reported chest pain in triage although is not having any chest pain or upper abdominal pain at this time.    Physical Exam   Triage Vital Signs: ED Triage Vitals  Enc Vitals Group     BP 05/25/22 0616 (!) 168/87     Pulse Rate 05/25/22 0616 73     Resp 05/25/22 0616 20     Temp 05/25/22 0616 98.4 F (36.9 C)     Temp Source 05/25/22 0616 Oral     SpO2 05/25/22 0616 100 %     Weight 05/25/22 0610 201 lb (91.2 kg)     Height 05/25/22 0610 5\' 4"  (1.626 m)     Head Circumference --      Peak Flow --      Pain Score 05/25/22 0610 10     Pain Loc --      Pain Edu? --      Excl. in GC? --     Most recent vital signs: Vitals:   05/25/22 0616 05/25/22 0937  BP: (!) 168/87 (!) 151/76  Pulse: 73 66  Resp: 20 18  Temp: 98.4 F (36.9 C)   SpO2: 100% 98%    General: Alert and oriented, well-appearing. CV:  Good peripheral perfusion.  Resp:  Normal effort.  Abd:  Soft with mild right lower quadrant tenderness.  Mild right flank tenderness.  No  distention.  Other:  No CVA tenderness.  No scleral icterus.   ED Results / Procedures / Treatments   Labs (all labs ordered are listed, but only abnormal results are displayed) Labs Reviewed  CBC - Abnormal; Notable for the following components:      Result Value   WBC 10.9 (*)    All other components within normal limits  COMPREHENSIVE METABOLIC PANEL - Abnormal; Notable for the following components:   Sodium 133 (*)    Glucose, Bld 149 (*)    All other components within normal limits  URINALYSIS, ROUTINE W REFLEX MICROSCOPIC - Abnormal; Notable for the following components:   Color, Urine YELLOW (*)    APPearance HAZY (*)    All other components within normal limits  LIPASE, BLOOD - Abnormal; Notable for the following components:   Lipase 197 (*)    All other components within normal limits  TROPONIN I (HIGH SENSITIVITY)  TROPONIN I (HIGH SENSITIVITY)     EKG  ED ECG REPORT I, 07/26/22, the attending physician, personally viewed  and interpreted this ECG.  Date: 05/25/2022 EKG Time: 06 34 Rate: 75 Rhythm: normal sinus rhythm QRS Axis: normal Intervals: normal ST/T Wave abnormalities: Nonspecific T wave abnormality Narrative Interpretation: no evidence of acute ischemia   RADIOLOGY  Chest x-ray: I independently viewed and interpreted the images; there is no focal consolidation or edema  CT abdomen/pelvis: I independently viewed and interpreted the images; there are no dilated bowel loops, inflammatory findings, or any free air or significant free fluid.  Radiology report indicates the following:   IMPRESSION:  1. Mild gaseous distension of the colon is identified from the cecum  up to the mid to distal sigmoid colon with decreased caliber within  the mid sigmoid colon. No obstructing mass or significant  inflammatory changes identified. According to the imaging time line  patient has been imaged this times over the past 2 years for similar   symptoms. Consider correlation with direct visualization to assess  for underlying neoplastic or inflammatory process not visualized on  CT.  2. Mild wall thickening of the decompressed distal sigmoid colon and  rectum likely reflects incomplete distension.  3. Status post hysterectomy and cholecystectomy.    PROCEDURES:  Critical Care performed: No  Procedures   MEDICATIONS ORDERED IN ED: Medications  iohexol (OMNIPAQUE) 300 MG/ML solution 100 mL (100 mLs Intravenous Contrast Given 05/25/22 0845)     IMPRESSION / MDM / ASSESSMENT AND PLAN / ED COURSE  I reviewed the triage vital signs and the nursing notes.  71 year old female with PMH as noted above presents with right lower quadrant pain which is new for her.  I reviewed the past medical records.  The patient has multiple prior visits for abdominal pain which mainly occurs in the left lower quadrant.  Recent CT in May showed a large stool burden.  Her most recent imaging was a noncontrast CT on 7/3 which showed no acute abnormalities.  Her most recent outpatient primary care visit was on 5/16 at which time she was primarily evaluated for nausea with food.  Based on prior evaluations overall cause of her pain has been thought to be constipation.  She was started on MiraLAX previously as well as lactulose yesterday.  On exam currently her vital signs are normal except for hypertension.  There is mild right lower quadrant tenderness.  Physical exam is otherwise unremarkable.  Differential diagnosis includes, but is not limited to, constipation, musculoskeletal pain, ureteral stone, UTI/pyelonephritis, colitis, diverticulitis, appendicitis.  Given that the pain is improving on its own I have a low suspicion for vascular etiology such as renal artery infarct or dissection.  There has been no evidence of vascular anomalies on prior imaging.  Patient's presentation is most consistent with acute complicated illness / injury requiring  diagnostic workup.  Although the patient has had multiple recent prior imaging studies, given that she is reporting this as new and more severe pain along with her age I feel that it is prudent to obtain repeat imaging today.  I discussed this with the patient and she agrees.  I have ordered a contrast CT.  We will obtain lab work-up and reassess.  The patient is on the cardiac monitor to evaluate for evidence of arrhythmia and/or significant heart rate changes.  ----------------------------------------- 10:51 AM on 05/25/2022 -----------------------------------------  Lab work-up is overall reassuring with normal electrolytes, LFTs, bilirubin, and creatinine.  There is no leukocytosis.  Urinalysis is negative.  Troponins are negative x2.  The lipase is somewhat elevated from yesterday although this is  of unclear clinical significance as the patient is not having any epigastric pain, nausea or vomiting, tenderness in the area.  CT shows mild gaseous distention of the distal colon with no acute findings.  On reassessment, the patient states that her pain is significantly better without requiring any medication in the ED.  She appears comfortable.  She has no nausea or vomiting.  She feels well to go home.  At this time, there is no evidence of any new or acute process in the abdomen or indication for further ED work-up or admission.  I did consult Dr. Timothy Lasso from gastroenterology to discuss the CT findings as well as the elevated lipase.  He advises that the patient does not meet criteria for acute pancreatitis based on her other lab work-up and her clinical presentation.  He agrees that there are no indications for inpatient management and that the patient is appropriate for follow-up in 2 days with her gastroenterologist at [facility].  I counseled the patient and her family member on the results of the work-up and follow-up plan.  I advised them to fill the prescription for the lactulose from  yesterday.  I gave him thorough return precautions and they expressed understanding.   FINAL CLINICAL IMPRESSION(S) / ED DIAGNOSES   Final diagnoses:  Abdominal pain, chronic, left lower quadrant  Elevated lipase     Rx / DC Orders   ED Discharge Orders     None        Note:  This document was prepared using Dragon voice recognition software and may include unintentional dictation errors.    Dionne Bucy, MD 05/25/22 1057

## 2022-05-25 NOTE — Discharge Instructions (Addendum)
Instructions for GI: Destiny Savage has been evaluated in the ED several times over the last few months for recurrent lower abdominal pain.  Prior imaging studies have shown a high stool burden, and CT from 7/8 shows:  1. Mild gaseous distension of the colon is identified from the cecum  up to the mid to distal sigmoid colon with decreased caliber within  the mid sigmoid colon. No obstructing mass or significant  inflammatory changes identified. According to the imaging time line  patient has been imaged this times over the past 2 years for similar  symptoms. Consider correlation with direct visualization to assess  for underlying neoplastic or inflammatory process not visualized on  CT.  2. Mild wall thickening of the decompressed distal sigmoid colon and  rectum likely reflects incomplete distension.   We recommend that the patient have a colonoscopy for further evaluation.  In addition, her lipase has recently been elevated and was 197 on 7/8 with normal LFTs, no white count, and no clinical symptoms or CT findings of acute pancreatitis.  Instructions for the patient: Follow-up with the GI doctor on Monday as scheduled.  Take the lactulose that was prescribed yesterday in addition to the MiraLAX.  Drink plenty of fluids over the next few days and avoid fatty foods as this will help decrease stress on your pancreas.  You should avoid any alcohol.  Return to the ER for new, worsening, or persistent severe abdominal pain, vomiting, fever, weakness, or any other new or worsening symptoms that concern you.

## 2022-05-27 ENCOUNTER — Emergency Department: Payer: Medicare Other

## 2022-05-27 ENCOUNTER — Other Ambulatory Visit: Payer: Self-pay

## 2022-05-27 ENCOUNTER — Encounter: Payer: Self-pay | Admitting: Internal Medicine

## 2022-05-27 ENCOUNTER — Observation Stay
Admission: EM | Admit: 2022-05-27 | Discharge: 2022-05-28 | Disposition: A | Discharge status: Home / Self Care | Payer: Medicare Other | Attending: Internal Medicine | Admitting: Internal Medicine

## 2022-05-27 DIAGNOSIS — Z7982 Long term (current) use of aspirin: Secondary | ICD-10-CM | POA: Insufficient documentation

## 2022-05-27 DIAGNOSIS — Z7902 Long term (current) use of antithrombotics/antiplatelets: Secondary | ICD-10-CM | POA: Insufficient documentation

## 2022-05-27 DIAGNOSIS — Z79899 Other long term (current) drug therapy: Secondary | ICD-10-CM | POA: Insufficient documentation

## 2022-05-27 DIAGNOSIS — Z7984 Long term (current) use of oral hypoglycemic drugs: Secondary | ICD-10-CM | POA: Insufficient documentation

## 2022-05-27 DIAGNOSIS — R1032 Left lower quadrant pain: Principal | ICD-10-CM | POA: Insufficient documentation

## 2022-05-27 DIAGNOSIS — K5909 Other constipation: Secondary | ICD-10-CM

## 2022-05-27 DIAGNOSIS — R748 Abnormal levels of other serum enzymes: Secondary | ICD-10-CM | POA: Insufficient documentation

## 2022-05-27 DIAGNOSIS — Z8673 Personal history of transient ischemic attack (TIA), and cerebral infarction without residual deficits: Secondary | ICD-10-CM | POA: Insufficient documentation

## 2022-05-27 DIAGNOSIS — R7402 Elevation of levels of lactic acid dehydrogenase (LDH): Secondary | ICD-10-CM | POA: Diagnosis not present

## 2022-05-27 DIAGNOSIS — Z91040 Latex allergy status: Secondary | ICD-10-CM | POA: Insufficient documentation

## 2022-05-27 DIAGNOSIS — J45909 Unspecified asthma, uncomplicated: Secondary | ICD-10-CM | POA: Diagnosis not present

## 2022-05-27 DIAGNOSIS — N179 Acute kidney failure, unspecified: Secondary | ICD-10-CM | POA: Diagnosis not present

## 2022-05-27 DIAGNOSIS — R109 Unspecified abdominal pain: Secondary | ICD-10-CM

## 2022-05-27 DIAGNOSIS — I10 Essential (primary) hypertension: Secondary | ICD-10-CM | POA: Insufficient documentation

## 2022-05-27 DIAGNOSIS — J449 Chronic obstructive pulmonary disease, unspecified: Secondary | ICD-10-CM | POA: Insufficient documentation

## 2022-05-27 DIAGNOSIS — K529 Noninfective gastroenteritis and colitis, unspecified: Secondary | ICD-10-CM | POA: Diagnosis not present

## 2022-05-27 DIAGNOSIS — R7989 Other specified abnormal findings of blood chemistry: Secondary | ICD-10-CM

## 2022-05-27 DIAGNOSIS — R110 Nausea: Secondary | ICD-10-CM | POA: Insufficient documentation

## 2022-05-27 DIAGNOSIS — E119 Type 2 diabetes mellitus without complications: Secondary | ICD-10-CM | POA: Diagnosis not present

## 2022-05-27 LAB — COMPREHENSIVE METABOLIC PANEL
ALT: 30 U/L (ref 0–44)
AST: 35 U/L (ref 15–41)
Albumin: 4.3 g/dL (ref 3.5–5.0)
Alkaline Phosphatase: 64 U/L (ref 38–126)
Anion gap: 11 (ref 5–15)
BUN: 12 mg/dL (ref 8–23)
CO2: 22 mmol/L (ref 22–32)
Calcium: 9.7 mg/dL (ref 8.9–10.3)
Chloride: 101 mmol/L (ref 98–111)
Creatinine, Ser: 1.04 mg/dL — ABNORMAL HIGH (ref 0.44–1.00)
GFR, Estimated: 58 mL/min — ABNORMAL LOW (ref >60)
Glucose, Bld: 122 mg/dL — ABNORMAL HIGH (ref 70–99)
Potassium: 4 mmol/L (ref 3.5–5.1)
Sodium: 134 mmol/L — ABNORMAL LOW (ref 135–145)
Total Bilirubin: 0.6 mg/dL (ref 0.3–1.2)
Total Protein: 7.5 g/dL (ref 6.5–8.1)

## 2022-05-27 LAB — LACTIC ACID, PLASMA
Lactic Acid, Venous: 2.1 mmol/L (ref 0.5–1.9)
Lactic Acid, Venous: 1.3 mmol/L (ref 0.5–1.9)

## 2022-05-27 LAB — CBC
HCT: 37.6 % (ref 36.0–46.0)
Hemoglobin: 12.2 g/dL (ref 12.0–15.0)
MCH: 30 pg (ref 26.0–34.0)
MCHC: 32.4 g/dL (ref 30.0–36.0)
MCV: 92.6 fL (ref 80.0–100.0)
Platelets: 293 10*3/uL (ref 150–400)
RBC: 4.06 MIL/uL (ref 3.87–5.11)
RDW: 12.4 % (ref 11.5–15.5)
WBC: 11.6 10*3/uL — ABNORMAL HIGH (ref 4.0–10.5)
nRBC: 0 % (ref 0.0–0.2)

## 2022-05-27 LAB — URINALYSIS, ROUTINE W REFLEX MICROSCOPIC
Bilirubin Urine: NEGATIVE
Glucose, UA: NEGATIVE mg/dL
Hgb urine dipstick: NEGATIVE
Ketones, ur: NEGATIVE mg/dL
Leukocytes,Ua: NEGATIVE
Nitrite: NEGATIVE
Protein, ur: NEGATIVE mg/dL
Specific Gravity, Urine: <1.005 — ABNORMAL LOW (ref 1.005–1.030)
pH: 5 (ref 5.0–8.0)

## 2022-05-27 LAB — GLUCOSE, CAPILLARY
Glucose-Capillary: 157 mg/dL — ABNORMAL HIGH (ref 70–99)
Glucose-Capillary: 121 mg/dL — ABNORMAL HIGH (ref 70–99)
Glucose-Capillary: 146 mg/dL — ABNORMAL HIGH (ref 70–99)
Glucose-Capillary: 121 mg/dL — ABNORMAL HIGH (ref 70–99)
Glucose-Capillary: 117 mg/dL — ABNORMAL HIGH (ref 70–99)

## 2022-05-27 LAB — HIV ANTIBODY (ROUTINE TESTING W REFLEX): HIV Screen 4th Generation wRfx: NONREACTIVE

## 2022-05-27 LAB — LIPASE, BLOOD: Lipase: 100 U/L — ABNORMAL HIGH (ref 11–51)

## 2022-05-27 LAB — TROPONIN I (HIGH SENSITIVITY)
Troponin I (High Sensitivity): 5 ng/L (ref <18)
Troponin I (High Sensitivity): 6 ng/L (ref <18)

## 2022-05-27 LAB — HEMOGLOBIN A1C
Hgb A1c MFr Bld: 6.6 % — ABNORMAL HIGH (ref 4.8–5.6)
Mean Plasma Glucose: 142.72 mg/dL

## 2022-05-27 MED ORDER — ONDANSETRON HCL 4 MG/2ML IJ SOLN: 4.0000 mg | Freq: Four times a day (QID) | INTRAMUSCULAR | Status: DC | PRN

## 2022-05-27 MED ORDER — ONDANSETRON HCL 4 MG PO TABS: 4.0000 mg | ORAL_TABLET | Freq: Four times a day (QID) | ORAL | Status: DC | PRN

## 2022-05-27 MED ORDER — LACTULOSE 10 GM/15ML PO SOLN
20.0000 g | Freq: Once | ORAL | Status: AC
  Administered 2022-05-27: 20 g via ORAL
  Filled 2022-05-27: qty 30

## 2022-05-27 MED ORDER — ATORVASTATIN CALCIUM 20 MG PO TABS
40.0000 mg | ORAL_TABLET | Freq: Every day | ORAL | Status: DC
  Administered 2022-05-27 – 2022-05-28 (×2): 40 mg via ORAL
  Filled 2022-05-27 (×2): qty 2

## 2022-05-27 MED ORDER — DICYCLOMINE HCL 20 MG PO TABS: 20.0000 mg | ORAL_TABLET | Freq: Three times a day (TID) | ORAL | Status: DC | PRN

## 2022-05-27 MED ORDER — ENOXAPARIN SODIUM 60 MG/0.6ML IJ SOSY: 0.5000 mg/kg | PREFILLED_SYRINGE | INTRAMUSCULAR | Status: DC

## 2022-05-27 MED ORDER — ACETAMINOPHEN 650 MG RE SUPP: 650.0000 mg | Freq: Four times a day (QID) | RECTAL | Status: DC | PRN

## 2022-05-27 MED ORDER — MORPHINE SULFATE (PF) 4 MG/ML IV SOLN
4.0000 mg | Freq: Once | INTRAVENOUS | Status: AC
  Administered 2022-05-27: 4 mg via INTRAVENOUS
  Filled 2022-05-27: qty 1

## 2022-05-27 MED ORDER — NORTRIPTYLINE HCL 10 MG PO CAPS
10.0000 mg | ORAL_CAPSULE | Freq: Every day | ORAL | Status: DC
  Administered 2022-05-27: 10 mg via ORAL
  Filled 2022-05-27: qty 1

## 2022-05-27 MED ORDER — ACETAMINOPHEN 325 MG PO TABS: 650.0000 mg | ORAL_TABLET | Freq: Four times a day (QID) | ORAL | Status: DC | PRN

## 2022-05-27 MED ORDER — PEG 3350-KCL-NA BICARB-NACL 420 G PO SOLR
4000.0000 mL | Freq: Once | ORAL | Status: AC
  Administered 2022-05-27: 4000 mL via ORAL
  Filled 2022-05-27: qty 4000

## 2022-05-27 MED ORDER — CARVEDILOL 6.25 MG PO TABS
6.2500 mg | ORAL_TABLET | Freq: Two times a day (BID) | ORAL | Status: DC
  Administered 2022-05-27 – 2022-05-28 (×3): 6.25 mg via ORAL
  Filled 2022-05-27 (×3): qty 1

## 2022-05-27 MED ORDER — POLYETHYLENE GLYCOL 3350 17 G PO PACK
17.0000 g | PACK | Freq: Every day | ORAL | Status: DC
  Administered 2022-05-27 – 2022-05-28 (×2): 17 g via ORAL
  Filled 2022-05-27 (×2): qty 1

## 2022-05-27 MED ORDER — INSULIN ASPART 100 UNIT/ML IJ SOLN
0.0000 [IU] | INTRAMUSCULAR | Status: DC
  Administered 2022-05-27 (×3): 2 [IU] via SUBCUTANEOUS
  Administered 2022-05-27 – 2022-05-28 (×2): 3 [IU] via SUBCUTANEOUS
  Filled 2022-05-27 (×5): qty 1

## 2022-05-27 MED ORDER — ORAL CARE MOUTH RINSE: 15.0000 mL | OROMUCOSAL | Status: DC | PRN

## 2022-05-27 MED ORDER — ENOXAPARIN SODIUM 60 MG/0.6ML IJ SOSY
0.5000 mg/kg | PREFILLED_SYRINGE | INTRAMUSCULAR | Status: DC
  Administered 2022-05-27: 45 mg via SUBCUTANEOUS
  Filled 2022-05-27: qty 0.6

## 2022-05-27 MED ORDER — KETOROLAC TROMETHAMINE 30 MG/ML IJ SOLN
15.0000 mg | Freq: Once | INTRAMUSCULAR | Status: AC
  Administered 2022-05-27: 15 mg via INTRAVENOUS
  Filled 2022-05-27: qty 1

## 2022-05-27 MED ORDER — BISACODYL 10 MG RE SUPP
10.0000 mg | Freq: Once | RECTAL | Status: AC
  Administered 2022-05-27: 10 mg via RECTAL
  Filled 2022-05-27: qty 1

## 2022-05-27 MED ORDER — MORPHINE SULFATE (PF) 2 MG/ML IV SOLN
2.0000 mg | INTRAVENOUS | Status: DC | PRN
  Administered 2022-05-27 (×2): 2 mg via INTRAVENOUS
  Filled 2022-05-27 (×2): qty 1

## 2022-05-27 MED ORDER — ASPIRIN 81 MG PO TBEC
81.0000 mg | DELAYED_RELEASE_TABLET | Freq: Every day | ORAL | Status: DC
  Administered 2022-05-27 – 2022-05-28 (×2): 81 mg via ORAL
  Filled 2022-05-27 (×2): qty 1

## 2022-05-27 MED ORDER — PANTOPRAZOLE SODIUM 40 MG PO TBEC
40.0000 mg | DELAYED_RELEASE_TABLET | Freq: Every day | ORAL | Status: DC
  Administered 2022-05-27 – 2022-05-28 (×2): 40 mg via ORAL
  Filled 2022-05-27 (×2): qty 1

## 2022-05-27 MED ORDER — BISACODYL 5 MG PO TBEC
20.0000 mg | DELAYED_RELEASE_TABLET | Freq: Once | ORAL | Status: AC
  Administered 2022-05-27: 20 mg via ORAL
  Filled 2022-05-27: qty 4

## 2022-05-27 MED ORDER — LACTATED RINGERS IV BOLUS
1000.0000 mL | Freq: Once | INTRAVENOUS | Status: AC
  Administered 2022-05-27: 1000 mL via INTRAVENOUS

## 2022-05-27 MED ORDER — BOOST / RESOURCE BREEZE PO LIQD CUSTOM: 1.0000 | Freq: Three times a day (TID) | ORAL | Status: DC

## 2022-05-27 MED ORDER — ONDANSETRON HCL 4 MG/2ML IJ SOLN
4.0000 mg | INTRAMUSCULAR | Status: AC
  Administered 2022-05-27: 4 mg via INTRAVENOUS
  Filled 2022-05-27: qty 2

## 2022-05-27 NOTE — Consult Note (Signed)
GI Inpatient Consult Note  Reason for Consult: LLQ abdominal pain, abnormal CT scan    Attending Requesting Consult: Dr. Judd Gaudier  History of Present Illness: Destiny Savage is a 71 y.o. female seen for evaluation of acute on chronic LLQ abd pain and abnormal CT scan at the request of admitting hospitalist - Dr. Judd Gaudier. Patient has a PMH of T2DM, GERD, HTN, HLD, Hx of CVA, and asthma. She presented to the [facility] after midnight last night for acute on chronic LLQ abdominal pain and constipation. She was seen in the ED two days ago for same issues where she had CT scan performed which showed mild gaseous distention from cecum up to mid to distal sigmoid colon with decreased caliber within the mid-sigmoid colon. No obstructing mass or significant inflammatory changes noted. Direct visualization was advised and she was advised to follow-up with [facility] as outpatient to arrange colonoscopy. She did not meet admission criteria. She reports that her pain has worsened over the past few days so she decided to come back to the hospital. Upon presentation to the ED, all vital signs were stable. Labs were significant for WBC 11.6K with lactic acid 2.1, lipase 100, serum creatinine 1.04, and troponin 5. Abdominal x-ray commented on decrease in the degree of colonic distention when compared with previous exam without any acute abnormality. She was treated with morphine, toradol, zofran, and lactulose. She was admitted to the floor and a GI consult was obtained.   Patient seen and examined this morning resting comfortably in hospital bed. No acute events overnight. She reports her LLQ abdominal pain has been intermittent and chronic in nature. She has been dealing with these issues for >10 years. She had a colonoscopy performed in 2016 at [place] with Dr. Cristi Loron which showed adequate prep and two subcentimeter tubular adenomas removed from cecum and hepatic flexure. There was normal mucosa in terminal  ileum and colon with negative TI, right/left colon biopsies. In 2012 she had small bowel enteroscopy with edematous and irregular small bowel mucosa but normal small bowel biopsies. CT a/p in 2012 with non-specific diffuse wall thickening of the proximal jejunum and distal duodenum. This year she has been evaluated several times for the abdominal pain and constipation. She has had multiple imaging modalities, including 4 CT scans of abd/pelvis, MRI/MRCP, and pelvic ultrasound. She reports that the abdominal pain seems to stay in her LLQ and can sometimes radiate around to her back. Pain is described as cramping in nature and seems to come in waves. She denies any rectal bleeding, hematochezia, or melanotic stools. She suffers greatly from chronic constipation and reports she can go 4-5 days in between bowel movements. She currently takes 2 capfuls of Miralax daily and reports this does not work for her.    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   Stroke ([place]) 01/10/2017   Mixed hyperlipidemia 11/21/2016   Neck pain 11/21/2016   Chest  pain 12/23/2015   GERD (gastroesophageal reflux disease) 12/23/2015   Abdominal pain, acute on chronic 04/30/2013   Vertigo 05/11/2012   Hypertension 05/11/2012   Diabetes type 2, controlled ([place]) 05/11/2012   Neck pain on right side 05/11/2012    Past Surgical History: Past Surgical History:  Procedure Laterality Date   ABDOMINAL HYSTERECTOMY     BREAST EXCISIONAL BIOPSY Left  1980's   benign   CARDIAC CATHETERIZATION     CHOLECYSTECTOMY      Allergies: Allergies  Allergen Reactions   Latex Hives and Rash    Other reaction(s): Other (See Comments)   Lidocaine Hives   Phenobarbital-Belladonna Alk     Other reaction(s): Hives & Rash   Phenobarbital-Belladonna Alk Hives and Rash    Home Medications: Medications Prior to Admission  Medication Sig Dispense Refill Last Dose   acetaminophen (TYLENOL) 500 MG tablet Take 500-1,000 mg by mouth every 8 (eight) hours as needed.   prn at prn   albuterol (VENTOLIN HFA) 108 (90 Base) MCG/ACT inhaler Inhale 1 puff into the lungs every 6 (six) hours as needed for wheezing.   prn at prn   aspirin 81 MG tablet Take 1 tablet (81 mg total) by mouth daily.   05/26/2022 at 0900   atorvastatin (LIPITOR) 40 MG tablet TAKE 1 TABLET BY MOUTH ONCE DAILY AT  6PM 30 tablet 5 05/26/2022 at 0900   carvedilol (COREG) 6.25 MG tablet Take 1 tablet (6.25 mg total) by mouth 2 (two) times daily with a meal. 180 tablet 3 05/26/2022 at 1800   cholecalciferol (VITAMIN D3) 25 MCG (1000 UNIT) tablet Take 1,000 Units by mouth daily.   05/26/2022 at 0900   clopidogrel (PLAVIX) 75 MG tablet Take 1 tablet (75 mg total) by mouth daily. 90 tablet 3 05/26/2022 at 0900   docusate sodium (COLACE) 100 MG capsule Take 1 capsule (100 mg total) by mouth 2 (two) times daily. (Patient taking differently: Take 100 mg by mouth 2 (two) times daily as needed for mild constipation.) 60 capsule 2 prn at prn   furosemide (LASIX) 20 MG tablet Take 1 tablet by mouth once daily 90 tablet 0 05/26/2022 at 0900   metFORMIN (GLUCOPHAGE-XR) 500 MG 24 hr tablet Take 1,000 mg by mouth daily.      Multiple Vitamin (MULTIVITAMIN WITH MINERALS) TABS tablet Take 1 tablet by mouth daily.   05/26/2022 at 0900   omeprazole (PRILOSEC) 20 MG capsule Take 20 mg by mouth daily.   05/26/2022 at 0900   pantoprazole (PROTONIX) 40 MG tablet Take 40 mg by mouth daily.   05/26/2022 at 0900   polyethylene glycol  (MIRALAX) 17 g packet Take 17 g by mouth daily. 14 each 0 05/26/2022 at 0900   potassium chloride (KLOR-CON) 10 MEQ tablet Take 1 tablet by mouth twice daily (Patient taking differently: Take 10 mEq by mouth once.) 60 tablet 0 05/26/2022 at 0900   Besifloxacin HCl (BESIVANCE) 0.6 % SUSP Besivance 0.6 % eye drops,suspension  INSTILL 1 DROP INTO RIGHT EYE THREE TIMES DAILY AS DIRECTED (Patient not taking: Reported on 05/27/2022)   Not Taking   carbamide peroxide (DEBROX) 6.5 % OTIC solution Place 5 drops into the right ear 2 (two) times daily. (Patient not taking: Reported on 05/27/2022) 15 mL 1 Not Taking   HYDROcodone-acetaminophen (NORCO/VICODIN) 5-325 MG tablet Take 1 tablet by mouth every 6 (six) hours as needed for moderate pain. (Patient not taking: Reported on 05/27/2022) 10 tablet 0 Not Taking   lactulose ([place])  20 g packet Take 1 packet (20 g total) by mouth daily. 30 packet 0    methylPREDNISolone (MEDROL DOSEPAK) 4 MG TBPK tablet Take 6 pills on day one then decrease by 1 pill each day (Patient not taking: Reported on 05/27/2022) 21 tablet 0 Completed Course   metoCLOPramide (REGLAN) 10 MG tablet Take 1 tablet (10 mg total) by mouth every 8 (eight) hours as needed for nausea. (Patient not taking: Reported on 05/27/2022) 20 tablet 0 Not Taking   nortriptyline (PAMELOR) 10 MG capsule Take 1 capsule (10 mg total) by mouth at bedtime. (Patient not taking: Reported on 05/27/2022) 30 capsule 5 Not Taking   ondansetron (ZOFRAN-ODT) 4 MG disintegrating tablet Take 1 tablet (4 mg total) by mouth every 8 (eight) hours as needed for nausea or vomiting. 20 tablet 0 prn at prn   potassium chloride SA (KLOR-CON M) 10 MEQ tablet Take 1 tablet (10 mEq total) by mouth daily for 3 days. 3 tablet 0    triamcinolone cream (KENALOG) 0.5 % Apply 1 application topically 2 (two) times daily. (Patient not taking: Reported on 05/27/2022)   Not Taking   Home medication reconciliation was completed with the patient.    Scheduled Inpatient Medications:    aspirin EC  81 mg Oral Daily   atorvastatin  40 mg Oral Daily   carvedilol  6.25 mg Oral BID WC   enoxaparin (LOVENOX) injection  0.5 mg/kg Subcutaneous Q24H   insulin aspart  0-15 Units Subcutaneous Q4H   nortriptyline  10 mg Oral QHS   polyethylene glycol  17 g Oral Daily    Continuous Inpatient Infusions:    PRN Inpatient Medications:  acetaminophen **OR** acetaminophen, dicyclomine, morphine injection, ondansetron **OR** ondansetron (ZOFRAN) IV, mouth rinse  Family History: family history includes CAD in an other family member; Heart Problems in her father; Hypertension in an other family member.  The patient's family history is negative for inflammatory bowel disorders, GI malignancy, or solid organ transplantation.  Social History:   reports that she has never smoked. She has never used smokeless tobacco. She reports that she does not drink alcohol and does not use drugs. The patient denies ETOH, tobacco, or drug use.   Review of Systems: Constitutional: Weight is stable.  Eyes: No changes in vision. ENT: No oral lesions, sore throat.  GI: see HPI.  Heme/Lymph: No easy bruising.  CV: No chest pain.  GU: No hematuria.  Integumentary: No rashes.  Neuro: No headaches.  Psych: No depression/anxiety.  Endocrine: No heat/cold intolerance.  Allergic/Immunologic: No urticaria.  Resp: No cough, SOB.  Musculoskeletal: No joint swelling.    Physical Examination: BP 135/67   Pulse 68   Temp 98.7 F (37.1 C)   Resp 16   Ht '5\' 4"'  (1.626 m)   Wt 91.2 kg   SpO2 99%   BMI 34.50 kg/m  Gen: NAD, alert and oriented x 4 HEENT: PEERLA, EOMI, Neck: supple, no JVD or thyromegaly Chest: CTA bilaterally, no wheezes, crackles, or other adventitious sounds CV: RRR, no m/g/c/r Abd: soft, nondistended, +BS in all four quadrants; tender to deep palpation in LLQ not out of proportion to exam, no HSM, guarding, ridigity, or rebound tenderness Ext:  no edema, well perfused with 2+ pulses, Skin: no rash or lesions noted Lymph: no LAD  Data: Lab Results  Component Value Date   WBC 11.6 (H) 05/27/2022   HGB 12.2 05/27/2022   HCT 37.6 05/27/2022   MCV 92.6 05/27/2022   PLT 293 05/27/2022  Recent Labs  Lab 05/24/22 1441 05/25/22 0615 05/27/22 0112  HGB 12.4 12.4 12.2   Lab Results  Component Value Date   NA 134 (L) 05/27/2022   K 4.0 05/27/2022   CL 101 05/27/2022   CO2 22 05/27/2022   BUN 12 05/27/2022   CREATININE 1.04 (H) 05/27/2022   Lab Results  Component Value Date   ALT 30 05/27/2022   AST 35 05/27/2022   ALKPHOS 64 05/27/2022   BILITOT 0.6 05/27/2022   No results for input(s): "APTT", "INR", "PTT" in the last 168 hours.  CT scan abd/pelvis with contrast 05/25/2022: IMPRESSION: 1. Mild gaseous distension of the colon is identified from the cecum up to the mid to distal sigmoid colon with decreased caliber within the mid sigmoid colon. No obstructing mass or significant inflammatory changes identified. According to the imaging time line patient has been imaged this times over the past 2 years for similar symptoms. Consider correlation with direct visualization to assess for underlying neoplastic or inflammatory process not visualized on CT. 2. Mild wall thickening of the decompressed distal sigmoid colon and rectum likely reflects incomplete distension. 3. Status post hysterectomy and cholecystectomy.    Assessment/Plan:  71 y/o AA female with a PMH of HTN, COPD, T2DM, and Hx of CVA presented to the [facility] ED overnight for chief complaint of acute on chronic LLQ abdominal pain and constipation  Acute on chronic LLQ abdominal pain  Chronic constipation - possibly IBS-C versus CIC  Abnormal CT scan - mild gaseous distention from cecum up to mid to distal sigmoid colon with decreased caliber within mid sigmoid colon without obstructing mass or inflammatory changes  Elevated lactic acid - does not meet  sepsis protocol, low clinical suspicion for bowel ischemia  AKI - improving with IVF hydration   Hx of adenomatous colon polyps - last CSY 2016  Recommendations:  - Reviewed her previous endoscopic procedure reports and recent ED visits with imaging - Suspect underlying acute on chronic abdominal pain is 2/2 uncontrolled chronic constipation and intestinal spasms - Continue supportive care with IV fluid hydration, pain control, and antiemetics  - Discussed importance of bowel regimen to make bowels more regular - Advise colonoscopy for endoluminal evaluation with biopsies to rule out structural abnormality (mass, polyp, stricture, etc) - Clear liquid diet today. NPO after midnight. Bowel prep orders in. - Colonoscopy tomorrow with Dr. Virgina Jock - Further recommendations after procedure - Following along with you   I reviewed the risks (including bleeding, perforation, infection, anesthesia complications, cardiac/respiratory complications), benefits and alternatives of colonoscopy. Patient consents to proceed.    Thank you for the consult. Please call with questions or concerns.  Geanie Kenning, PA-C [facility] [phone]

## 2022-05-27 NOTE — ED Provider Notes (Signed)
[facility] Provider Note    Event Date/Time   First MD Initiated Contact with Patient 05/27/22 0029     (approximate)   History   Abdominal Pain   HPI  Destiny Savage is a 71 y.o. female who presents with her family for evaluation of recurrent abdominal pain.  The patient reports that she has been having several weeks of intermittent pain, usually starting above her bellybutton and radiating through to her back but sometimes up on the left upper quadrant.  She has been to the emergency department several times and no one can tell her what is wrong.  She also has an appointment with an outpatient GI doctor but she says that she does not like the way they talk to her because they just laugh about it and say that she needs to poop.  She said that she is having bowel movements although she cannot remember the date of her last 1.  She takes 2 packets of MiraLAX daily and it does not seem to help.  The last time she was here was about 2 days ago and she had a full work-up including a CT scan (her family notes that she has had multiple recent CT scans) and that her lipase was elevated but the doctor said that she can follow-up as an outpatient.  She has not been able to eat or drink very much because of the nausea but she has had no vomiting.  The pain seems to come and go but right now feels like she is being "stabbed by 100 knives".  She denies alcohol use and she denies being on any new medications.     Physical Exam   Triage Vital Signs: ED Triage Vitals  Enc Vitals Group     BP 05/27/22 0025 (!) 145/81     Pulse Rate 05/27/22 0025 82     Resp 05/27/22 0025 18     Temp 05/27/22 0025 98.7 F (37.1 C)     Temp Source 05/27/22 0025 Oral     SpO2 05/27/22 0025 97 %     Weight 05/27/22 0024 91.2 kg (201 lb)     Height 05/27/22 0024 1.626 m (5\' 4" )     Head Circumference --      Peak Flow --      Pain Score 05/27/22 0024 10     Pain Loc --      Pain Edu? --       Excl. in GC? --     Most recent vital signs: Vitals:   05/27/22 0025  BP: (!) 145/81  Pulse: 82  Resp: 18  Temp: 98.7 F (37.1 C)  SpO2: 97%     General: Awake, appears uncomfortable but not in severe distress. CV:  Good peripheral perfusion.  Normal heart sounds. Resp:  Normal effort.  Lungs are clear to auscultation bilaterally. Abd:  Obese.  Generalized tenderness to palpation throughout the abdomen, no rebound or guarding, no localized peritonitis. Other:  No obvious focal neurological deficits.   ED Results / Procedures / Treatments   Labs (all labs ordered are listed, but only abnormal results are displayed) Labs Reviewed  LIPASE, BLOOD  COMPREHENSIVE METABOLIC PANEL  CBC  URINALYSIS, ROUTINE W REFLEX MICROSCOPIC  TROPONIN I (HIGH SENSITIVITY)     EKG  ED ECG REPORT I, 07/28/22, the attending physician, personally viewed and interpreted this ECG.  Date: 05/27/2022 EKG Time: 1:20 AM Rate: 66 Rhythm: normal sinus rhythm QRS  Axis: normal Intervals: normal ST/T Wave abnormalities: normal Narrative Interpretation: no evidence of acute ischemia    RADIOLOGY See hospital course for details: Acute abdomen series with chest x-ray showed no sign of acute abnormality    PROCEDURES:  Critical Care performed: No  .1-3 Lead EKG Interpretation  Performed by: Loleta Rose, MD Authorized by: Loleta Rose, MD     Interpretation: normal     ECG rate:  65   ECG rate assessment: normal     Rhythm: sinus rhythm     Ectopy: none     Conduction: normal      MEDICATIONS ORDERED IN ED: Medications  morphine (PF) 4 MG/ML injection 4 mg (has no administration in time range)  ondansetron (ZOFRAN) injection 4 mg (has no administration in time range)  lactated ringers bolus 1,000 mL (has no administration in time range)  ketorolac (TORADOL) 30 MG/ML injection 15 mg (has no administration in time range)     IMPRESSION / MDM / ASSESSMENT AND PLAN / ED  COURSE  I reviewed the triage vital signs and the nursing notes.                              Differential diagnosis includes, but is not limited to, constipation, obstipation, SBO/ileus, pancreatitis, biliary dysfunction, appendicitis, diverticulitis, electrolyte or metabolic abnormality.  Patient's presentation is most consistent with acute presentation with potential threat to life or bodily function.  Patient has been in the emergency department multiple times recently for similar issues and reportedly has had multiple CT scans.  There was some colonic wall thickening identified on her last CT dimension the need for "direct visualization", which presumably needs GI follow-up with colonoscopy, and there is a substantial colonic stool burden.  However the acute onset pain she is having now could represent worsening pancreatitis since she has had 2 slightly elevated and increasingly elevated lipase levels recently.  Orders/studies: Comprehensive metabolic panel, lipase, CBC, urinalysis, high-sensitivity troponin, lactic acid, EKG. holding off on imaging for now.  Medications: Morphine 4 mg IV, Toradol 15 mg IV, Zofran 4 mg IV, LR 1 L IV bolus.  The patient is on the cardiac monitor to evaluate for evidence of arrhythmia and/or significant heart rate changes.  Clinical Course as of 05/27/22 0319  Mon May 27, 2022  0135 CBC(!) Hemoglobin is stable, leukocytosis is going up very slightly from her past visit, now 11.6. [CF]  0145 Lipase(!): 100 Still elevated at 100, slightly down from last time. [CF]  0200 Patient's lactic acid is elevated at 2.1, which could represent acute infection, but could also represent her acute on chronic pain resulting on inability to tolerate fluids.  We will recheck a second lactic acid after she finishes her 1 L LR IV fluid bolus. [CF]  0235 I checked on the patient and she is moving around as if she cannot stay still, stating that the pain is actually worse now  than it was before.  She remains generally tender to palpation throughout the abdomen without any specific localized tenderness.  I ordered an acute abdomen series of x-rays to rule out evidence of acute SBO/ileus and to look for free air.  However, I looked back through the records, and I see that just within the [facility] system, she has had 7 abdomen/pelvis CTs in the last 15 months.  I do not feel an additional CT scan is likely to be helpful. [CF]  0237 I looked  back through multiple CT reports from prior, and all of the admission a large volume of stool.  She cannot remember when she last had a bowel movement.  I will provide a dose of lactulose which apparently has helped in the past.  However, the patient has a slightly elevated lipase, mild leukocytosis, lactic acid of 2.1, and uncontrolled abdominal pain as well as uncontrolled hypertension.  All of these are worrisome, particularly given the intractable pain and intractable nausea and decreased oral intake.  I asked her if she thought that she can go home and follow-up as an outpatient and she does not believe that she can.  I will consult the hospitalist for admission. [CF]  0932 I am ordering lactulose 20 g p.o. and another dose of morphine 4 mg IV given the intractable pain. [CF]  3557 Consulting the hospitalist team for admission. [CF]  0258 Discussed case by phone with Dr. Para March with the hospitalist service who will admit the patient. [CF]  0258 Of note, her blood pressure was substantially elevated before but it has come down to 157/83 which she does not require medical intervention.  I suspect her blood pressure is related to her discomfort. [CF]  0310 DG Abdomen Acute W/Chest Viewed and interpreted acute abdomen w/ chest xrays.  No evidence of obstruction. [CF]    Clinical Course User Index [CF] Loleta Rose, MD     FINAL CLINICAL IMPRESSION(S) / ED DIAGNOSES   Final diagnoses:  Intractable abdominal pain  Uncontrolled  hypertension  Nausea  Elevated lipase  Elevated lactic acid level  Chronic constipation     Rx / DC Orders   ED Discharge Orders     None        Note:  This document was prepared using Dragon voice recognition software and may include unintentional dictation errors.   Loleta Rose, MD 05/27/22 [phone]

## 2022-05-27 NOTE — ED Triage Notes (Signed)
Pt states has been having continued llq pain that radiates to her back and sometimes up to her luq for several weeks. Pt has been seen for same several times without improvement in pain with medication prescribed.

## 2022-05-27 NOTE — Assessment & Plan Note (Addendum)
Lipase 197>100 Keeping n.p.o. in case of procedure by GI

## 2022-05-27 NOTE — Assessment & Plan Note (Signed)
Sepsis not suspected.  Low suspicion as well for bowel ischemia  Had contrasted CT of abdomen 7/8 GI consulted Monitor for improvement with hydration Antibiotics not initiated

## 2022-05-27 NOTE — Assessment & Plan Note (Addendum)
Etiology has been undetermined  Abnormal labs include mild lipase elevation, mild leukocytosis and lactic acid 2.1 CT abdomen and pelvis with contrast on 7/8 non diagnostic, recommendation to "Consider correlation with direct visualization to assess for underlying neoplastic or inflammatory process not visualized on CT". GI consulted for opinion and recommendations regarding endoscopy inpatient versus outpatient.   Last colonoscopy was 2016 with tubular adenomas. Pain control with antispasmodics  IV hydration, antiemetics and supportive care We will keep n.p.o.

## 2022-05-27 NOTE — Assessment & Plan Note (Signed)
Sliding scale insulin coverage 

## 2022-05-27 NOTE — H&P (Signed)
History and Physical    Patient: Destiny Savage BCW:[id number] DOB: Aug 10, 1951 DOA: 05/27/2022 DOS: the patient was seen and examined on 05/27/2022 PCP: Albina Billet, MD  Patient coming from: Home  Chief Complaint:  Chief Complaint  Patient presents with   Abdominal Pain    HPI: Destiny Savage is a 71 y.o. female with medical history significant for DM, HTN, COPD who presents to the emergency room for evaluation of left lower quadrant pain.  Extensive chart review reveals that patient has been complaining of left lower quadrant pain for the past several months to over a year associated with constipation, loss of appetite, nausea and early satiety.  Pain is not postprandial.  She has been using MiraLAX and laxatives.  Last colonoscopy in 2016 showed a few tubular adenomas.  Over the course of the past few months, she has had  4 CT scans of the abdomen and pelvis, an MRI and MRCP of the abdomen back in April, pelvic ultrasound in May.  A year ago she had a nuclear biliary scan.  Most recently on 7/8 she had CT abdomen and pelvis with contrast  that showed the following findings: IMPRESSION: 1. Mild gaseous distension of the colon is identified from the cecum up to the mid to distal sigmoid colon with decreased caliber within the mid sigmoid colon. No obstructing mass or significant inflammatory changes identified. According to the imaging time line patient has been imaged this times over the past 2 years for similar symptoms. Consider correlation with direct visualization to assess for underlying neoplastic or inflammatory process not visualized on CT. 2. Mild wall thickening of the decompressed distal sigmoid colon and rectum likely reflects incomplete distension. 3. Status post hysterectomy and cholecystectomy.  ED course and data review: Vitals within normal limits except for slightly elevated blood pressure Labs WBC 11,600 with lactic acid 2.1.  Lipase 100, down from 197 on 7/8.   Creatinine 1.04 up from baseline of 0.78 a month prior.  Urinalysis unremarkable.  Troponin of 5  Acute abdominal series shows the following: IMPRESSION: Decrease in the degree of colonic distension when compared with the previous exam. No acute abnormality noted.  Patient treated with lactulose, morphine, Toradol and Zofran.  Hospitalist consulted for admission.     Past Medical History:  Diagnosis Date   Arthritis    Asthma    Diabetes mellitus    GERD (gastroesophageal reflux disease)    Hyperlipidemia    Hypertension    Mini stroke    Stroke [facility])    Past Surgical History:  Procedure Laterality Date   ABDOMINAL HYSTERECTOMY     BREAST EXCISIONAL BIOPSY Left 1980's   benign   CARDIAC CATHETERIZATION     CHOLECYSTECTOMY     Social History:  reports that she has never smoked. She has never used smokeless tobacco. She reports that she does not drink alcohol and does not use drugs.  Allergies  Allergen Reactions   Latex Hives   Lidocaine Hives   Phenobarbital-Belladonna Alk Hives and Rash    Family History  Problem Relation Age of Onset   Heart Problems Father    Hypertension Other    CAD Other    Breast cancer Neg Hx     Prior to Admission medications   Medication Sig Start Date End Date Taking? Authorizing Provider  albuterol (VENTOLIN HFA) 108 (90 Base) MCG/ACT inhaler Inhale 1 puff into the lungs every 6 (six) hours as needed for wheezing.    [provider]  aspirin 81 MG tablet Take 1 tablet (81 mg total) by mouth daily. 01/11/17   Hillary Bow, MD  atorvastatin (LIPITOR) 40 MG tablet TAKE 1 TABLET BY MOUTH ONCE DAILY AT  6PM 11/13/21   Minna Merritts, MD  Besifloxacin HCl (BESIVANCE) 0.6 % SUSP Besivance 0.6 % eye drops,suspension  INSTILL 1 DROP INTO RIGHT EYE THREE TIMES DAILY AS DIRECTED    [provider]  carbamide peroxide (DEBROX) 6.5 % OTIC solution Place 5 drops into the right ear 2 (two) times daily. 11/19/21   Marney Setting, NP  carvedilol (COREG) 6.25 MG tablet Take 1 tablet (6.25 mg total) by mouth 2 (two) times daily with a meal. 03/21/20   Gollan, Kathlene November, MD  cholecalciferol (VITAMIN D3) 25 MCG (1000 UNIT) tablet Take 1,000 Units by mouth daily.    [provider]  clopidogrel (PLAVIX) 75 MG tablet Take 1 tablet (75 mg total) by mouth daily. 03/21/20   Minna Merritts, MD  docusate sodium (COLACE) 100 MG capsule Take 1 capsule (100 mg total) by mouth 2 (two) times daily. 05/19/22 05/19/23  Harvest Dark, MD  furosemide (LASIX) 20 MG tablet Take 1 tablet by mouth once daily 01/07/22   Minna Merritts, MD  HYDROcodone-acetaminophen (NORCO/VICODIN) 5-325 MG tablet Take 1 tablet by mouth every 6 (six) hours as needed for moderate pain. 09/08/21   Fisher, Linden Dolin, PA-C  lactulose (CEPHULAC) 20 g packet Take 1 packet (20 g total) by mouth daily. 05/24/22 06/23/22  Rada Hay, MD  metFORMIN (GLUCOPHAGE-XR) 500 MG 24 hr tablet Take 1,000 mg by mouth daily. 11/23/19   [provider]  methylPREDNISolone (MEDROL DOSEPAK) 4 MG TBPK tablet Take 6 pills on day one then decrease by 1 pill each day 09/08/21   Versie Starks, PA-C  metoCLOPramide (REGLAN) 10 MG tablet Take 1 tablet (10 mg total) by mouth every 8 (eight) hours as needed for nausea. 04/04/22 04/04/23  Arta Silence, MD  nortriptyline (PAMELOR) 10 MG capsule Take 1 capsule (10 mg total) by mouth at bedtime. 05/28/21   Pieter Partridge, DO  omeprazole (PRILOSEC) 20 MG capsule Take 20 mg by mouth daily.    [provider]  ondansetron (ZOFRAN-ODT) 4 MG disintegrating tablet Take 1 tablet (4 mg total) by mouth every 8 (eight) hours as needed for nausea or vomiting. 04/09/22   Duffy Bruce, MD  polyethylene glycol (MIRALAX) 17 g packet Take 17 g by mouth daily. 04/06/21   Nance Pear, MD  potassium chloride (KLOR-CON) 10 MEQ tablet Take 1 tablet by mouth twice daily 04/29/22   Minna Merritts, MD  potassium chloride SA  (KLOR-CON M) 10 MEQ tablet Take 1 tablet (10 mEq total) by mouth daily for 3 days. 04/09/22 04/12/22  Duffy Bruce, MD  triamcinolone cream (KENALOG) 0.5 % Apply 1 application topically 2 (two) times daily. 07/13/18   [provider]    Physical Exam: Vitals:   05/27/22 0025 05/27/22 0135 05/27/22 0200 05/27/22 0230  BP: (!) 145/81 (!) 191/87 (!) 216/89 (!) 157/83  Pulse: 82 65 72 66  Resp: 18 13 (!) 21 13  Temp: 98.7 F (37.1 C)     TempSrc: Oral     SpO2: 97% 100% 100% 96%  Weight:      Height:       Physical Exam Vitals and nursing note reviewed.  Constitutional:      General: She is not in acute distress. HENT:  Head: Normocephalic and atraumatic.  Cardiovascular:     Rate and Rhythm: Normal rate and regular rhythm.     Heart sounds: Normal heart sounds.  Pulmonary:     Effort: Pulmonary effort is normal.     Breath sounds: Normal breath sounds.  Abdominal:     Palpations: Abdomen is soft.     Tenderness: There is no abdominal tenderness.  Neurological:     Mental Status: Mental status is at baseline.     Labs on Admission: I have personally reviewed following labs and imaging studies  CBC: Recent Labs  Lab 05/20/22 2307 05/24/22 1441 05/25/22 0615 05/27/22 0112  WBC 9.0 8.3 10.9* 11.6*  HGB 12.3 12.4 12.4 12.2  HCT 38.6 38.2 38.0 37.6  MCV 92.8 91.0 91.1 92.6  PLT 315 291 303 240   Basic Metabolic Panel: Recent Labs  Lab 05/20/22 2307 05/24/22 1441 05/25/22 0615 05/27/22 0112  NA 137 137 133* 134*  K 4.2 4.0 3.6 4.0  CL 102 101 100 101  CO2 '25 26 22 22  ' GLUCOSE 106* 163* 149* 122*  BUN '9 12 12 12  ' CREATININE 0.87 1.04* 0.96 1.04*  CALCIUM 10.4* 9.8 9.8 9.7   GFR: Estimated Creatinine Clearance: 55.1 mL/min (A) (by C-G formula based on SCr of 1.04 mg/dL (H)). Liver Function Tests: Recent Labs  Lab 05/24/22 1441 05/25/22 0615 05/27/22 0112  AST 33 34 35  ALT 30 32 30  ALKPHOS 71 74 64  BILITOT 0.8 0.6 0.6  PROT 7.4 7.9 7.5   ALBUMIN 4.3 4.5 4.3   Recent Labs  Lab 05/24/22 1441 05/25/22 0820 05/27/22 0112  LIPASE 113* 197* 100*   No results for input(s): "AMMONIA" in the last 168 hours. Coagulation Profile: No results for input(s): "INR", "PROTIME" in the last 168 hours. Cardiac Enzymes: No results for input(s): "CKTOTAL", "CKMB", "CKMBINDEX", "TROPONINI" in the last 168 hours. BNP (last 3 results) No results for input(s): "PROBNP" in the last 8760 hours. HbA1C: No results for input(s): "HGBA1C" in the last 72 hours. CBG: No results for input(s): "GLUCAP" in the last 168 hours. Lipid Profile: No results for input(s): "CHOL", "HDL", "LDLCALC", "TRIG", "CHOLHDL", "LDLDIRECT" in the last 72 hours. Thyroid Function Tests: No results for input(s): "TSH", "T4TOTAL", "FREET4", "T3FREE", "THYROIDAB" in the last 72 hours. Anemia Panel: No results for input(s): "VITAMINB12", "FOLATE", "FERRITIN", "TIBC", "IRON", "RETICCTPCT" in the last 72 hours. Urine analysis:    Component Value Date/Time   COLORURINE YELLOW 05/27/2022 0112   APPEARANCEUR CLEAR 05/27/2022 0112   APPEARANCEUR Clear 02/19/2015 1647   LABSPEC <1.005 (L) 05/27/2022 0112   LABSPEC 1.025 02/19/2015 1647   PHURINE 5.0 05/27/2022 0112   GLUCOSEU NEGATIVE 05/27/2022 0112   GLUCOSEU Negative 02/19/2015 1647   HGBUR NEGATIVE 05/27/2022 0112   BILIRUBINUR NEGATIVE 05/27/2022 0112   BILIRUBINUR Negative 02/19/2015 [place] 05/27/2022 0112   PROTEINUR NEGATIVE 05/27/2022 0112   NITRITE NEGATIVE 05/27/2022 0112   LEUKOCYTESUR NEGATIVE 05/27/2022 0112   LEUKOCYTESUR Trace 02/19/2015 1647    Radiological Exams on Admission: DG Abdomen Acute W/Chest  Result Date: 05/27/2022 CLINICAL DATA:  Recurrent abdominal pain EXAM: DG ABDOMEN ACUTE WITH 1 VIEW CHEST COMPARISON:  05/25/2022 FINDINGS: Cardiac shadow is within normal limits. The lungs are well aerated bilaterally and clear. No bony abnormality is noted. Scattered large and  small bowel gas is noted. No free air is seen. The degree of gaseous distension of the colon has improved significantly from the prior CT. No obstructive  changes are noted. No bony abnormality is seen. IMPRESSION: Decrease in the degree of colonic distension when compared with the previous exam. No acute abnormality noted. Electronically Signed   By: Inez Catalina M.D.   On: 05/27/2022 03:07   CT ABDOMEN PELVIS W CONTRAST  Result Date: 05/25/2022 CLINICAL DATA:  Left lower quadrant abdominal pain. EXAM: CT ABDOMEN AND PELVIS WITH CONTRAST TECHNIQUE: Multidetector CT imaging of the abdomen and pelvis was performed using the standard protocol following bolus administration of intravenous contrast. RADIATION DOSE REDUCTION: This exam was performed according to the departmental dose-optimization program which includes automated exposure control, adjustment of the mA and/or kV according to patient size and/or use of iterative reconstruction technique. CONTRAST:  156m OMNIPAQUE IOHEXOL 300 MG/ML  SOLN COMPARISON:  05/20/2022 FINDINGS: Lower chest: No acute abnormality. Hepatobiliary: No focal liver abnormality. Cholecystectomy. No signs of bile duct dilatation. Pancreas: Unremarkable. No pancreatic ductal dilatation or surrounding inflammatory changes. Spleen: Normal in size without focal abnormality. Adrenals/Urinary Tract: Adrenal glands are unremarkable. Kidneys are normal, without renal calculi, focal lesion, or hydronephrosis. Bladder is unremarkable. Stomach/Bowel: Stomach appears normal. The appendix is visualized and is within normal limits. No signs small bowel wall thickening, inflammation, or distension. Gaseous distension of the colon is identified from the cecum up to the mid to distal sigmoid colon. The colon measures up to 5.5 cm with abrupt caliber change within the mid sigmoid colon. The distal sigmoid colon and rectum appear decreased in caliber. Mild wall thickening of the distal sigmoid colon and  rectum likely reflects incomplete distension. No obstructing mass or significant inflammatory changes identified. Vascular/Lymphatic: Normal appearance of the abdominal aorta. No signs abdominopelvic adenopathy. Reproductive: Status post hysterectomy. No adnexal masses. Other: No free fluid or fluid collections. No signs of pneumoperitoneum. Musculoskeletal: No acute or significant osseous findings. IMPRESSION: 1. Mild gaseous distension of the colon is identified from the cecum up to the mid to distal sigmoid colon with decreased caliber within the mid sigmoid colon. No obstructing mass or significant inflammatory changes identified. According to the imaging time line patient has been imaged this times over the past 2 years for similar symptoms. Consider correlation with direct visualization to assess for underlying neoplastic or inflammatory process not visualized on CT. 2. Mild wall thickening of the decompressed distal sigmoid colon and rectum likely reflects incomplete distension. 3. Status post hysterectomy and cholecystectomy. Electronically Signed   By: TKerby MoorsM.D.   On: 05/25/2022 09:43   DG Chest 2 View  Result Date: 05/25/2022 CLINICAL DATA:  Chest pain. EXAM: CHEST - 2 VIEW COMPARISON:  04/09/2022 FINDINGS: Heart size and mediastinal contours are normal. Pulmonary vascular congestion. No pleural effusion or edema. No airspace opacities identified. Mild thoracic degenerative disc disease. IMPRESSION: Pulmonary vascular congestion. Electronically Signed   By: TKerby MoorsM.D.   On: 05/25/2022 07:34     Data Reviewed: Relevant notes from primary care and specialist visits, past discharge summaries as available in EHR, including Care Everywhere. Prior diagnostic testing as pertinent to current admission diagnoses Updated medications and problem lists for reconciliation ED course, including vitals, labs, imaging, treatment and response to treatment Triage notes, nursing and pharmacy  notes and ED provider's notes Notable results as noted in HPI   Assessment and Plan: * Abdominal pain, acute on chronic Etiology has been undetermined  Abnormal labs include mild lipase elevation, mild leukocytosis and lactic acid 2.1 CT abdomen and pelvis with contrast on 7/8 non diagnostic, recommendation to "Consider correlation with direct  visualization to assess for underlying neoplastic or inflammatory process not visualized on CT". GI consulted for opinion and recommendations regarding endoscopy inpatient versus outpatient.   Last colonoscopy was 2016 with tubular adenomas. Pain control with antispasmodics  IV hydration, antiemetics and supportive care We will keep n.p.o.  Elevated lipase Lipase 197>100 Keeping n.p.o. in case of procedure by GI  Elevated lactic acid level Sepsis not suspected.  Low suspicion as well for bowel ischemia  Had contrasted CT of abdomen 7/8 GI consulted Monitor for improvement with hydration Antibiotics not initiated   AKI (acute kidney injury) ([place]) IV hydration and monitor Creatinine 1.04, up from baseline of 0.78, likely related to poor oral intake and diuretics  Chronic constipation Safe to continue MiraLAX and Colace Will hold lactulose for now  Diabetes type 2, controlled ([place]) Sliding scale insulin coverage  Hypertension Continue carvedilol.  Hold furosemide due to AKI    DVT prophylaxis: Lovenox  Consults: none  Advance Care Planning:   Code Status: Prior   Family Communication: Husband at bedside  Disposition Plan: Back to previous home environment  Severity of Illness: The appropriate patient status for this patient is OBSERVATION. Observation status is judged to be reasonable and necessary in order to provide the required intensity of service to ensure the patient's safety. The patient's presenting symptoms, physical exam findings, and initial radiographic and laboratory data in the context of their medical condition  is felt to place them at decreased risk for further clinical deterioration. Furthermore, it is anticipated that the patient will be medically stable for discharge from the hospital within 2 midnights of admission.   Author: Athena Masse, MD 05/27/2022 3:49 AM  For on call review [email or web address].

## 2022-05-27 NOTE — Progress Notes (Signed)
Initial Nutrition Assessment  DOCUMENTATION CODES:   Obesity unspecified  INTERVENTION:   Boost Breeze po TID, each supplement provides 250 kcal and 9 grams of protein  Ensure Enlive po TID with diet advancement, each supplement provides 350 kcal and 20 grams of protein.  MVI po daily with diet advancement   Pt at high refeed risk; recommend monitor potassium, magnesium and phosphorus labs daily until stable  NUTRITION DIAGNOSIS:   Inadequate oral intake related to acute illness as evidenced by per patient/family report.  GOAL:   Patient will meet greater than or equal to 90% of their needs  MONITOR:   PO intake, Supplement acceptance, Labs, Weight trends, I & O's, Skin  REASON FOR ASSESSMENT:   Malnutrition Screening Tool    ASSESSMENT:   71 y/o female with h/o DM, GERD, HTN, HLD, CVA and asthma who is admitted with chronic constipation and left lower quadrant pain.  Met with pt in room today. Pt reports decreased appetite and oral intake for the past 1-2 months r/t nausea and early satiety. Pt reports that even smelling food sometimes will make her nauseas. Pt has not been drinking any supplements at home but reports that she likes Boost as she has had this before. Pt currently on clear liquid diet in hospital. Pt trying to get down bowel prep at time of RD visit. Plan is for colonoscopy tomorrow. Per chart, pt is down 9lbs(4%) over the past 2 months; this is not significant. Pt reports her UBW is ~210-220lbs. RD discussed with pt the importance of adequate nutrition needed to preserve lean muscle. Pt is willing to drink supplements in hospital and after discharge. RD will add supplements and MVI to help pt meet her estimated needs. Pt is at high refeed risk.   Medications reviewed and include: aspirin, lovenox, insulin, miralax  Labs reviewed: Na 134(L), K 4.0 wnl  Wbc- 11.6(H) Cbgs- 146, 121, 157 x 24 hrs AIC 6.6(H)- 7/10  NUTRITION - FOCUSED PHYSICAL  EXAM:  Flowsheet Row Most Recent Value  Orbital Region No depletion  Upper Arm Region No depletion  Thoracic and Lumbar Region No depletion  Buccal Region No depletion  Temple Region No depletion  Clavicle Bone Region No depletion  Clavicle and Acromion Bone Region No depletion  Scapular Bone Region No depletion  Dorsal Hand No depletion  Patellar Region No depletion  Anterior Thigh Region No depletion  Posterior Calf Region No depletion  Edema (RD Assessment) None  Hair Reviewed  Eyes Reviewed  Mouth Reviewed  Skin Reviewed  Nails Reviewed   Diet Order:   Diet Order             Diet NPO time specified  Diet effective midnight           Diet clear liquid Room service appropriate? Yes; Fluid consistency: Thin  Diet effective now                  EDUCATION NEEDS:   Education needs have been addressed  Skin:  Skin Assessment: Reviewed RN Assessment (ecchymosis)  Last BM:  7/7  Height:   Ht Readings from Last 1 Encounters:  05/27/22 $RemoveB'5\' 4"'[id number]$  (1.626 m)    Weight:   Wt Readings from Last 1 Encounters:  05/27/22 91.2 kg   BMI:  Body mass index is 34.5 kg/m.  Estimated Nutritional Needs:   Kcal:  1900-2200kcal/day  Protein:  95-110g/day  Fluid:  1.5-1.7L/day  Koleen Distance MS, RD, LDN Please refer to [facility] for RD  and/or RD on-call/weekend/after hours pager

## 2022-05-27 NOTE — Assessment & Plan Note (Addendum)
Safe to continue MiraLAX and Colace Will hold lactulose for now

## 2022-05-27 NOTE — ED Notes (Signed)
Pt being transported to rm 223 at this time via [facility].

## 2022-05-27 NOTE — Progress Notes (Signed)
Progress Note    Destiny Savage  EYC:[id number] DOB: July 27, 1951  DOA: 05/27/2022 PCP: Jaclyn Shaggy, MD      Brief Narrative:    Medical records reviewed and are as summarized below:  Destiny Savage is a 71 y.o. female ***    No notes on file      Assessment/Plan:   Principal Problem:   Abdominal pain, acute on chronic Active Problems:   Elevated lipase   AKI (acute kidney injury) (HCC)   Elevated lactic acid level   Hypertension   Diabetes type 2, controlled (HCC)   Chronic constipation          Body mass index is 34.5 kg/m.  Diet Order             Diet NPO time specified Except for: Ice Chips, Sips with Meds  Diet effective now                            Consultants: ***  Procedures: ***    Medications:    aspirin EC  81 mg Oral Daily   atorvastatin  40 mg Oral Daily   carvedilol  6.25 mg Oral BID WC   enoxaparin (LOVENOX) injection  0.5 mg/kg Subcutaneous Q24H   insulin aspart  0-15 Units Subcutaneous Q4H   nortriptyline  10 mg Oral QHS   polyethylene glycol  17 g Oral Daily   Continuous Infusions:   Anti-infectives (From admission, onward)    None              Family Communication/Anticipated D/C date and plan/Code Status   DVT prophylaxis:      Code Status: Full Code  Family Communication: *** Disposition Plan: ***   Status is: Observation {Observation:23811}       Subjective:   ***  Objective:    Vitals:   05/27/22 0430 05/27/22 0500 05/27/22 0614 05/27/22 0802  BP: (!) 148/77 (!) 144/82 (!) 159/82 135/67  Pulse: 67 64 67 68  Resp: 14 14 20 16   Temp:   98.3 F (36.8 C) 98.7 F (37.1 C)  TempSrc:   Oral   SpO2: 96% 99% 100% 99%  Weight:      Height:       No data found.  No intake or output data in the 24 hours ending 05/27/22 0808 Filed Weights   05/27/22 0024  Weight: 91.2 kg    Exam:  ***       Data Reviewed:   I have personally reviewed  following labs and imaging studies:  Labs: Labs show the following:   Basic Metabolic Panel: Recent Labs  Lab 05/20/22 2307 05/24/22 1441 05/25/22 0615 05/27/22 0112  NA 137 137 133* 134*  K 4.2 4.0 3.6 4.0  CL 102 101 100 101  CO2 25 26 22 22   GLUCOSE 106* 163* 149* 122*  BUN 9 12 12 12   CREATININE 0.87 1.04* 0.96 1.04*  CALCIUM 10.4* 9.8 9.8 9.7   GFR Estimated Creatinine Clearance: 55.1 mL/min (A) (by C-G formula based on SCr of 1.04 mg/dL (H)). Liver Function Tests: Recent Labs  Lab 05/24/22 1441 05/25/22 0615 05/27/22 0112  AST 33 34 35  ALT 30 32 30  ALKPHOS 71 74 64  BILITOT 0.8 0.6 0.6  PROT 7.4 7.9 7.5  ALBUMIN 4.3 4.5 4.3   Recent Labs  Lab 05/24/22 1441 05/25/22 0820 05/27/22 0112  LIPASE 113* 197* 100*  No results for input(s): "AMMONIA" in the last 168 hours. Coagulation profile No results for input(s): "INR", "PROTIME" in the last 168 hours.  CBC: Recent Labs  Lab 05/20/22 2307 05/24/22 1441 05/25/22 0615 05/27/22 0112  WBC 9.0 8.3 10.9* 11.6*  HGB 12.3 12.4 12.4 12.2  HCT 38.6 38.2 38.0 37.6  MCV 92.8 91.0 91.1 92.6  PLT 315 291 303 293   Cardiac Enzymes: No results for input(s): "CKTOTAL", "CKMB", "CKMBINDEX", "TROPONINI" in the last 168 hours. BNP (last 3 results) No results for input(s): "PROBNP" in the last 8760 hours. CBG: Recent Labs  Lab 05/27/22 0631 05/27/22 0802  GLUCAP 157* 121*   D-Dimer: No results for input(s): "DDIMER" in the last 72 hours. Hgb A1c: No results for input(s): "HGBA1C" in the last 72 hours. Lipid Profile: No results for input(s): "CHOL", "HDL", "LDLCALC", "TRIG", "CHOLHDL", "LDLDIRECT" in the last 72 hours. Thyroid function studies: No results for input(s): "TSH", "T4TOTAL", "T3FREE", "THYROIDAB" in the last 72 hours.  Invalid input(s): "FREET3" Anemia work up: No results for input(s): "VITAMINB12", "FOLATE", "FERRITIN", "TIBC", "IRON", "RETICCTPCT" in the last 72 hours. Sepsis  Labs: Recent Labs  Lab 05/20/22 2307 05/24/22 1441 05/25/22 0615 05/27/22 0112 05/27/22 0646  WBC 9.0 8.3 10.9* 11.6*  --   LATICACIDVEN  --   --   --  2.1* 1.3    Microbiology No results found for this or any previous visit (from the past 240 hour(s)).  Procedures and diagnostic studies:  DG Abdomen Acute W/Chest  Result Date: 05/27/2022 CLINICAL DATA:  Recurrent abdominal pain EXAM: DG ABDOMEN ACUTE WITH 1 VIEW CHEST COMPARISON:  05/25/2022 FINDINGS: Cardiac shadow is within normal limits. The lungs are well aerated bilaterally and clear. No bony abnormality is noted. Scattered large and small bowel gas is noted. No free air is seen. The degree of gaseous distension of the colon has improved significantly from the prior CT. No obstructive changes are noted. No bony abnormality is seen. IMPRESSION: Decrease in the degree of colonic distension when compared with the previous exam. No acute abnormality noted. Electronically Signed   By: Alcide Clever M.D.   On: 05/27/2022 03:07   CT ABDOMEN PELVIS W CONTRAST  Result Date: 05/25/2022 CLINICAL DATA:  Left lower quadrant abdominal pain. EXAM: CT ABDOMEN AND PELVIS WITH CONTRAST TECHNIQUE: Multidetector CT imaging of the abdomen and pelvis was performed using the standard protocol following bolus administration of intravenous contrast. RADIATION DOSE REDUCTION: This exam was performed according to the departmental dose-optimization program which includes automated exposure control, adjustment of the mA and/or kV according to patient size and/or use of iterative reconstruction technique. CONTRAST:  OMNIPAQUE IOHEXOL 300 MG/ML  SOLN COMPARISON:  05/20/2022 FINDINGS: Lower chest: No acute abnormality. Hepatobiliary: No focal liver abnormality. Cholecystectomy. No signs of bile duct dilatation. Pancreas: Unremarkable. No pancreatic ductal dilatation or surrounding inflammatory changes. Spleen: Normal in size without focal abnormality.  Adrenals/Urinary Tract: Adrenal glands are unremarkable. Kidneys are normal, without renal calculi, focal lesion, or hydronephrosis. Bladder is unremarkable. Stomach/Bowel: Stomach appears normal. The appendix is visualized and is within normal limits. No signs small bowel wall thickening, inflammation, or distension. Gaseous distension of the colon is identified from the cecum up to the mid to distal sigmoid colon. The colon measures up to 5.5 cm with abrupt caliber change within the mid sigmoid colon. The distal sigmoid colon and rectum appear decreased in caliber. Mild wall thickening of the distal sigmoid colon and rectum likely reflects incomplete distension. No obstructing mass  or significant inflammatory changes identified. Vascular/Lymphatic: Normal appearance of the abdominal aorta. No signs abdominopelvic adenopathy. Reproductive: Status post hysterectomy. No adnexal masses. Other: No free fluid or fluid collections. No signs of pneumoperitoneum. Musculoskeletal: No acute or significant osseous findings. IMPRESSION: 1. Mild gaseous distension of the colon is identified from the cecum up to the mid to distal sigmoid colon with decreased caliber within the mid sigmoid colon. No obstructing mass or significant inflammatory changes identified. According to the imaging time line patient has been imaged this times over the past 2 years for similar symptoms. Consider correlation with direct visualization to assess for underlying neoplastic or inflammatory process not visualized on CT. 2. Mild wall thickening of the decompressed distal sigmoid colon and rectum likely reflects incomplete distension. 3. Status post hysterectomy and cholecystectomy. Electronically Signed   By: Signa Kell M.D.   On: 05/25/2022 09:43               LOS: 0 days   Lucetta Baehr  [facility]   Pager on [email or web address]. If 7PM-7AM, please contact night-coverage at [email or web address]     05/27/2022, 8:08 AM

## 2022-05-27 NOTE — Progress Notes (Signed)
Anticoagulation monitoring(Lovenox):  71 yo  female ordered Lovenox 40 mg Q24h    Filed Weights   05/27/22 0024  Weight: 91.2 kg (201 lb)   BMI 34.5   Lab Results  Component Value Date   CREATININE 1.04 (H) 05/27/2022   CREATININE 0.96 05/25/2022   CREATININE 1.04 (H) 05/24/2022   Estimated Creatinine Clearance: 55.1 mL/min (A) (by C-G formula based on SCr of 1.04 mg/dL (H)). Hemoglobin & Hematocrit     Component Value Date/Time   HGB 12.2 05/27/2022 0112   HGB 11.8 10/04/2019 0951   HCT 37.6 05/27/2022 0112   HCT 35.2 10/04/2019 0951     Per Protocol for Patient with estCrcl > 30 ml/min and BMI > 30, will transition to Lovenox 45 mg Q24h.

## 2022-05-27 NOTE — Assessment & Plan Note (Addendum)
IV hydration and monitor Creatinine 1.04, up from baseline of 0.78, likely related to poor oral intake and diuretics

## 2022-05-27 NOTE — Assessment & Plan Note (Addendum)
Continue carvedilol.  Hold furosemide due to AKI

## 2022-05-28 ENCOUNTER — Encounter
Admission: EM | Disposition: A | Discharge status: Home / Self Care | Payer: Self-pay | Source: Home / Self Care | Attending: Emergency Medicine

## 2022-05-28 ENCOUNTER — Encounter: Payer: Self-pay | Admitting: Anesthesiology

## 2022-05-28 DIAGNOSIS — R1032 Left lower quadrant pain: Secondary | ICD-10-CM | POA: Diagnosis not present

## 2022-05-28 DIAGNOSIS — R748 Abnormal levels of other serum enzymes: Secondary | ICD-10-CM | POA: Diagnosis not present

## 2022-05-28 DIAGNOSIS — K5909 Other constipation: Secondary | ICD-10-CM | POA: Diagnosis not present

## 2022-05-28 LAB — BASIC METABOLIC PANEL
Anion gap: 8 (ref 5–15)
BUN: 7 mg/dL — ABNORMAL LOW (ref 8–23)
CO2: 26 mmol/L (ref 22–32)
Calcium: 9.5 mg/dL (ref 8.9–10.3)
Chloride: 106 mmol/L (ref 98–111)
Creatinine, Ser: 0.89 mg/dL (ref 0.44–1.00)
GFR, Estimated: >60 mL/min (ref >60)
Glucose, Bld: 135 mg/dL — ABNORMAL HIGH (ref 70–99)
Potassium: 4 mmol/L (ref 3.5–5.1)
Sodium: 140 mmol/L (ref 135–145)

## 2022-05-28 LAB — GLUCOSE, CAPILLARY
Glucose-Capillary: 111 mg/dL — ABNORMAL HIGH (ref 70–99)
Glucose-Capillary: 118 mg/dL — ABNORMAL HIGH (ref 70–99)
Glucose-Capillary: 154 mg/dL — ABNORMAL HIGH (ref 70–99)
Glucose-Capillary: 97 mg/dL (ref 70–99)

## 2022-05-28 LAB — CBC
HCT: 35 % — ABNORMAL LOW (ref 36.0–46.0)
Hemoglobin: 11.4 g/dL — ABNORMAL LOW (ref 12.0–15.0)
MCH: 29.8 pg (ref 26.0–34.0)
MCHC: 32.6 g/dL (ref 30.0–36.0)
MCV: 91.4 fL (ref 80.0–100.0)
Platelets: 261 10*3/uL (ref 150–400)
RBC: 3.83 MIL/uL — ABNORMAL LOW (ref 3.87–5.11)
RDW: 12.5 % (ref 11.5–15.5)
WBC: 7.8 10*3/uL (ref 4.0–10.5)
nRBC: 0 % (ref 0.0–0.2)

## 2022-05-28 SURGERY — COLONOSCOPY
Anesthesia: General

## 2022-05-28 MED ORDER — LACTULOSE 10 GM/15ML PO SOLN: 20.0000 g | Freq: Two times a day (BID) | ORAL | Status: DC

## 2022-05-28 MED ORDER — DOCUSATE SODIUM 100 MG PO CAPS: 100.0000 mg | ORAL_CAPSULE | Freq: Two times a day (BID) | ORAL | Status: DC

## 2022-05-28 NOTE — Progress Notes (Signed)
Inpatient Follow-up/Progress Note   Patient ID: Destiny Savage is a 71 y.o. female.  Overnight Events / Subjective Findings NAEON. Pt reports drinking prep and has liquid bm. She did not let staff see- so unknown if clear. Received her recent colonoscopy records (late July 2022) which were normal with good prep. No indication to repeat colonscopy. Pt has long standing h/o IBS-C and two clean colonoscopies (prior was 2012) and no fhx of crc or polyps. No phx polyps. Counseled her and her husband about staying on top of hydration and treating constipation. Has miralax at home- told her she can increase to 1 capful from daily to up to 3 times a day. Pain has improved with BM. No n/v. No other acute gi complaints.  Review of Systems  Constitutional:  Negative for activity change, appetite change, chills, diaphoresis, fatigue, fever and unexpected weight change.  HENT:  Negative for trouble swallowing and voice change.   Respiratory:  Negative for shortness of breath and wheezing.   Cardiovascular:  Negative for chest pain, palpitations and leg swelling.  Gastrointestinal:  Positive for abdominal pain and constipation. Negative for abdominal distention, anal bleeding, blood in stool, diarrhea, nausea, rectal pain and vomiting.  Musculoskeletal:  Negative for arthralgias and myalgias.  Skin:  Negative for color change and pallor.  Neurological:  Negative for dizziness, syncope and weakness.  Psychiatric/Behavioral:  Negative for confusion.   All other systems reviewed and are negative.    Medications  Current Facility-Administered Medications:    acetaminophen (TYLENOL) tablet 650 mg, 650 mg, Oral, Q6H PRN **OR** acetaminophen (TYLENOL) suppository 650 mg, 650 mg, Rectal, Q6H PRN, Andris Baumann, MD   aspirin EC tablet 81 mg, 81 mg, Oral, Daily, Lindajo Royal V, MD, 81 mg at 05/27/22 1006   atorvastatin (LIPITOR) tablet 40 mg, 40 mg, Oral, Daily, Lindajo Royal V, MD, 40 mg at 05/27/22  1006   carvedilol (COREG) tablet 6.25 mg, 6.25 mg, Oral, BID WC, Lindajo Royal V, MD, 6.25 mg at 05/27/22 1733   dicyclomine (BENTYL) tablet 20 mg, 20 mg, Oral, TID WC PRN, Andris Baumann, MD   [START ON 05/29/2022] enoxaparin (LOVENOX) injection 45 mg, 0.5 mg/kg, Subcutaneous, Q24H, Beers, Brandon D, RPH   feeding supplement (BOOST / RESOURCE BREEZE) liquid 1 Container, 1 Container, Oral, TID BM, Lurene Shadow, MD   insulin aspart (novoLOG) injection 0-15 Units, 0-15 Units, Subcutaneous, Q4H, Andris Baumann, MD, 2 Units at 05/27/22 1732   morphine (PF) 2 MG/ML injection 2 mg, 2 mg, Intravenous, Q2H PRN, Lindajo Royal V, MD, 2 mg at 05/27/22 2258   nortriptyline (PAMELOR) capsule 10 mg, 10 mg, Oral, QHS, Lindajo Royal V, MD, 10 mg at 05/27/22 2027   ondansetron (ZOFRAN) tablet 4 mg, 4 mg, Oral, Q6H PRN **OR** ondansetron (ZOFRAN) injection 4 mg, 4 mg, Intravenous, Q6H PRN, Andris Baumann, MD   Oral care mouth rinse, 15 mL, Mouth Rinse, PRN, Lurene Shadow, MD   pantoprazole (PROTONIX) EC tablet 40 mg, 40 mg, Oral, Daily, Jaynie Collins, DO, 40 mg at 05/27/22 1733   polyethylene glycol (MIRALAX / GLYCOLAX) packet 17 g, 17 g, Oral, Daily, Lindajo Royal V, MD, 17 g at 05/27/22 1006   acetaminophen **OR** acetaminophen, dicyclomine, morphine injection, ondansetron **OR** ondansetron (ZOFRAN) IV, mouth rinse   Objective    Vitals:   05/27/22 0802 05/27/22 1615 05/27/22 1949 05/28/22 0404  BP: 135/67 (!) 142/84 (!) 145/80 134/70  Pulse: 68 62 71 95  Resp: 16 18  18 20  Temp: 98.7 F (37.1 C) 97.7 F (36.5 C) 98 F (36.7 C) 97.9 F (36.6 C)  TempSrc:      SpO2: 99% 99% 100% 100%  Weight:      Height:         Physical Exam Vitals and nursing note reviewed.  Constitutional:      General: She is not in acute distress.    Appearance: Normal appearance. She is obese. She is not ill-appearing, toxic-appearing or diaphoretic.  HENT:     Head: Normocephalic and atraumatic.      Nose: Nose normal.     Mouth/Throat:     Mouth: Mucous membranes are moist.     Pharynx: Oropharynx is clear.  Eyes:     General: No scleral icterus.    Extraocular Movements: Extraocular movements intact.  Cardiovascular:     Rate and Rhythm: Normal rate and regular rhythm.     Heart sounds: Normal heart sounds. No murmur heard.    No friction rub. No gallop.  Pulmonary:     Effort: Pulmonary effort is normal. No respiratory distress.     Breath sounds: Normal breath sounds. No wheezing, rhonchi or rales.  Abdominal:     General: Bowel sounds are normal. There is no distension.     Palpations: Abdomen is soft.     Tenderness: There is no abdominal tenderness. There is no guarding or rebound.  Musculoskeletal:     Cervical back: Neck supple.     Right lower leg: No edema.     Left lower leg: No edema.  Skin:    General: Skin is warm and dry.     Coloration: Skin is not jaundiced or pale.  Neurological:     General: No focal deficit present.     Mental Status: She is alert and oriented to person, place, and time. Mental status is at baseline.  Psychiatric:        Mood and Affect: Mood normal.        Behavior: Behavior normal.        Thought Content: Thought content normal.        Judgment: Judgment normal.      Laboratory Data Recent Labs  Lab 05/25/22 0615 05/27/22 0112 05/28/22 0528  WBC 10.9* 11.6* 7.8  HGB 12.4 12.2 11.4*  HCT 38.0 37.6 35.0*  PLT 303 293 261   Recent Labs  Lab 05/24/22 1441 05/25/22 0615 05/27/22 0112 05/28/22 0528  NA 137 133* 134* 140  K 4.0 3.6 4.0 4.0  CL 101 100 101 106  CO2 26 22 22 26   BUN 12 12 12  7*  CREATININE 1.04* 0.96 1.04* 0.89  CALCIUM 9.8 9.8 9.7 9.5  PROT 7.4 7.9 7.5  --   BILITOT 0.8 0.6 0.6  --   ALKPHOS 71 74 64  --   ALT 30 32 30  --   AST 33 34 35  --   GLUCOSE 163* 149* 122* 135*   No results for input(s): "INR" in the last 168 hours.    Imaging Studies: DG Abdomen Acute W/Chest  Result Date:  05/27/2022 CLINICAL DATA:  Recurrent abdominal pain EXAM: DG ABDOMEN ACUTE WITH 1 VIEW CHEST COMPARISON:  05/25/2022 FINDINGS: Cardiac shadow is within normal limits. The lungs are well aerated bilaterally and clear. No bony abnormality is noted. Scattered large and small bowel gas is noted. No free air is seen. The degree of gaseous distension of the colon has improved significantly from the  prior CT. No obstructive changes are noted. No bony abnormality is seen. IMPRESSION: Decrease in the degree of colonic distension when compared with the previous exam. No acute abnormality noted. Electronically Signed   By: Alcide Clever M.D.   On: 05/27/2022 03:07    Assessment:   71 y/o AA female with a PMH of HTN, COPD, T2DM, and Hx of CVA presented to the [facility] ED for chief complaint of acute on chronic LLQ abdominal pain and constipation  # IBS-C - long standing hx - last colonoscopy 05/2021 with good prep and normal - also underwent egd at that time which was relatively unremarkable - completed golytely prep o/n and sx have improved  # Acute on chronic LLQ pain- 2/2 above- improved  # Abnormal CT of gi tract- suspect 2/2 large stool burden with gaseous buildup - no signs of obstruction or inflammation  # AKI- improving  Plan:  Pt completed bowel prep with improvement of sx Reviewed outside records- considering normal colonoscopy within the last year, will cancel today's colonoscopy Continue with bowel regimen- have added bid lactulose and docusate Bentyl prn for pain/spasm Advance diet as tolerated Avoid agents that slow bowel motility Counseled on importance of hydration Continue protonix for reflux sx Outpatient f/u- may need amitiza or linzess addition if fails current regimen D/w hospitalist and nursing staff  GI to sign off. Available as needed.   I personally performed the service.  Management of other medical comorbidities as per primary team  Thank you for allowing [place] to  participate in this patient's care. Please don't hesitate to call if any questions or concerns arise.   Jaynie Collins, DO [facility]  Portions of the record may have been created with voice recognition software. Occasional wrong-word or 'sound-a-like' substitutions may have occurred due to the inherent limitations of voice recognition software.  Read the chart carefully and recognize, using context, where substitutions may have occurred.

## 2022-05-28 NOTE — Discharge Summary (Signed)
Physician Discharge Summary   Patient: Destiny Savage MRN: [id number] DOB: Mar 23, 1951  Admit date:     05/27/2022  Discharge date: 05/28/2022  Discharge Physician: Jennye Boroughs   PCP: Albina Billet, MD   Recommendations at discharge:   Follow-up with PCP in 1 week Follow-up with [facility] within 1 month of discharge  Discharge Diagnoses: Principal Problem:   Abdominal pain, acute on chronic Active Problems:   Elevated lipase   AKI (acute kidney injury) ([place])   Elevated lactic acid level   Hypertension   Diabetes type 2, controlled (H[facility])   Chronic constipation  Resolved Problems:   * No resolved hospital problems. *  Hospital Course:  Destiny Savage is a 71 year old woman with medical history significant for hypertension, type II DM, COPD, chronic constipation, who presented to the hospital with left-sided abdominal pain, poor appetite, nausea, early satiety and constipation.  She said her symptoms have been going on for a few months.  She also reported about 40 pound weight loss in 3 months.  She was admitted to the hospital for acute on chronic left lower quadrant abdominal pain and chronic constipation.  Lipase was elevated but it was not clear whether patient actually had pancreatitis or not.  Initially, colonoscopy was scheduled.  However, it was discovered that patient had a recent colonoscopy in 2022 that was unremarkable.  Planned colonoscopy was canceled.  Patient's symptoms improved after she was given "colon prep"/laxatives.  She is deemed stable for discharge to home today.  Discharge plan was discussed with the patient and her husband at the bedside.       Consultants: Gastroenterologist Procedures performed: None Disposition: Home Diet recommendation:  Discharge Diet Orders (From admission, onward)     Start     Ordered   05/28/22 0000  Diet - low sodium heart healthy        05/28/22 1126   05/28/22 0000  Diet Carb Modified        05/28/22 1126            Cardiac and Carb modified diet DISCHARGE MEDICATION: Allergies as of 05/28/2022       Reactions   Latex Hives, Rash   Other reaction(s): Other (See Comments)   Lidocaine Hives   Phenobarbital-belladonna Alk    Other reaction(s): Hives & Rash   Phenobarbital-belladonna Alk Hives, Rash        Medication List     STOP taking these medications    Besivance 0.6 % Susp Generic drug: Besifloxacin HCl   carbamide peroxide 6.5 % OTIC solution Commonly known as: DEBROX   HYDROcodone-acetaminophen 5-325 MG tablet Commonly known as: NORCO/VICODIN   methylPREDNISolone 4 MG Tbpk tablet Commonly known as: MEDROL DOSEPAK   metoCLOPramide 10 MG tablet Commonly known as: REGLAN   nortriptyline 10 MG capsule Commonly known as: PAMELOR   omeprazole 20 MG capsule Commonly known as: PRILOSEC   potassium chloride 10 MEQ tablet Commonly known as: KLOR-CON M   triamcinolone cream 0.5 % Commonly known as: KENALOG       TAKE these medications    acetaminophen 500 MG tablet Commonly known as: TYLENOL Take 500-1,000 mg by mouth every 8 (eight) hours as needed.   albuterol 108 (90 Base) MCG/ACT inhaler Commonly known as: VENTOLIN HFA Inhale 1 puff into the lungs every 6 (six) hours as needed for wheezing.   aspirin 81 MG tablet Take 1 tablet (81 mg total) by mouth daily.   atorvastatin 40 MG tablet  Commonly known as: LIPITOR TAKE 1 TABLET BY MOUTH ONCE DAILY AT  6PM   carvedilol 6.25 MG tablet Commonly known as: COREG Take 1 tablet (6.25 mg total) by mouth 2 (two) times daily with a meal.   cholecalciferol 25 MCG (1000 UNIT) tablet Commonly known as: VITAMIN D3 Take 1,000 Units by mouth daily.   clopidogrel 75 MG tablet Commonly known as: PLAVIX Take 1 tablet (75 mg total) by mouth daily.   docusate sodium 100 MG capsule Commonly known as: Colace Take 1 capsule (100 mg total) by mouth 2 (two) times daily. What changed:  when to take this reasons  to take this   furosemide 20 MG tablet Commonly known as: LASIX Take 1 tablet by mouth once daily   lactulose 20 g packet Commonly known as: CEPHULAC Take 1 packet (20 g total) by mouth daily.   metFORMIN 500 MG 24 hr tablet Commonly known as: GLUCOPHAGE-XR Take 1,000 mg by mouth daily.   multivitamin with minerals Tabs tablet Take 1 tablet by mouth daily.   ondansetron 4 MG disintegrating tablet Commonly known as: ZOFRAN-ODT Take 1 tablet (4 mg total) by mouth every 8 (eight) hours as needed for nausea or vomiting.   pantoprazole 40 MG tablet Commonly known as: PROTONIX Take 40 mg by mouth daily.   polyethylene glycol 17 g packet Commonly known as: MiraLax Take 17 g by mouth daily.   potassium chloride 10 MEQ tablet Commonly known as: KLOR-CON Take 1 tablet by mouth twice daily What changed: when to take this        Follow-up [place], Gaspar Skeeters, MD. Schedule an appointment as soon as possible for a visit in 1 month(s).   Specialty: Gastroenterology Contact information: [phone] [place] [address] [phone]                Discharge Exam: Danley Danker Weights   05/27/22 0024  Weight: 91.2 kg   GEN: NAD SKIN: No rash EYES: EOMI ENT: MMM CV: RRR PULM: CTA B ABD: soft, ND, NT, +BS CNS: AAO x 3, non focal EXT: No edema or tenderness   Condition at discharge: good  The results of significant diagnostics from this hospitalization (including imaging, microbiology, ancillary and laboratory) are listed below for reference.   Imaging Studies: DG Abdomen Acute W/Chest  Result Date: 05/27/2022 CLINICAL DATA:  Recurrent abdominal pain EXAM: DG ABDOMEN ACUTE WITH 1 VIEW CHEST COMPARISON:  05/25/2022 FINDINGS: Cardiac shadow is within normal limits. The lungs are well aerated bilaterally and clear. No bony abnormality is noted. Scattered large and small bowel gas is noted. No free air is seen. The degree of gaseous distension of the  colon has improved significantly from the prior CT. No obstructive changes are noted. No bony abnormality is seen. IMPRESSION: Decrease in the degree of colonic distension when compared with the previous exam. No acute abnormality noted. Electronically Signed   By: Inez Catalina M.D.   On: 05/27/2022 03:07   CT ABDOMEN PELVIS W CONTRAST  Result Date: 05/25/2022 CLINICAL DATA:  Left lower quadrant abdominal pain. EXAM: CT ABDOMEN AND PELVIS WITH CONTRAST TECHNIQUE: Multidetector CT imaging of the abdomen and pelvis was performed using the standard protocol following bolus administration of intravenous contrast. RADIATION DOSE REDUCTION: This exam was performed according to the departmental dose-optimization program which includes automated exposure control, adjustment of the mA and/or kV according to patient size and/or use of iterative reconstruction technique. CONTRAST:  176m OMNIPAQUE IOHEXOL  300 MG/ML  SOLN COMPARISON:  05/20/2022 FINDINGS: Lower chest: No acute abnormality. Hepatobiliary: No focal liver abnormality. Cholecystectomy. No signs of bile duct dilatation. Pancreas: Unremarkable. No pancreatic ductal dilatation or surrounding inflammatory changes. Spleen: Normal in size without focal abnormality. Adrenals/Urinary Tract: Adrenal glands are unremarkable. Kidneys are normal, without renal calculi, focal lesion, or hydronephrosis. Bladder is unremarkable. Stomach/Bowel: Stomach appears normal. The appendix is visualized and is within normal limits. No signs small bowel wall thickening, inflammation, or distension. Gaseous distension of the colon is identified from the cecum up to the mid to distal sigmoid colon. The colon measures up to 5.5 cm with abrupt caliber change within the mid sigmoid colon. The distal sigmoid colon and rectum appear decreased in caliber. Mild wall thickening of the distal sigmoid colon and rectum likely reflects incomplete distension. No obstructing mass or significant  inflammatory changes identified. Vascular/Lymphatic: Normal appearance of the abdominal aorta. No signs abdominopelvic adenopathy. Reproductive: Status post hysterectomy. No adnexal masses. Other: No free fluid or fluid collections. No signs of pneumoperitoneum. Musculoskeletal: No acute or significant osseous findings. IMPRESSION: 1. Mild gaseous distension of the colon is identified from the cecum up to the mid to distal sigmoid colon with decreased caliber within the mid sigmoid colon. No obstructing mass or significant inflammatory changes identified. According to the imaging time line patient has been imaged this times over the past 2 years for similar symptoms. Consider correlation with direct visualization to assess for underlying neoplastic or inflammatory process not visualized on CT. 2. Mild wall thickening of the decompressed distal sigmoid colon and rectum likely reflects incomplete distension. 3. Status post hysterectomy and cholecystectomy. Electronically Signed   By: Kerby Moors M.D.   On: 05/25/2022 09:43   DG Chest 2 View  Result Date: 05/25/2022 CLINICAL DATA:  Chest pain. EXAM: CHEST - 2 VIEW COMPARISON:  04/09/2022 FINDINGS: Heart size and mediastinal contours are normal. Pulmonary vascular congestion. No pleural effusion or edema. No airspace opacities identified. Mild thoracic degenerative disc disease. IMPRESSION: Pulmonary vascular congestion. Electronically Signed   By: Kerby Moors M.D.   On: 05/25/2022 07:34   CT Renal Stone Study  Result Date: 05/20/2022 CLINICAL DATA:  Flank pain, kidney stone suspected. Pt c/o right side flank pain with radiation to RLQ/right groin area. Pt states pain started today and got much worse tonight. Pt states pain comes in waves. Pt denies urinary symptoms, denies hx of kidney stones. EXAM: CT ABDOMEN AND PELVIS WITHOUT CONTRAST TECHNIQUE: Multidetector CT imaging of the abdomen and pelvis was performed following the standard protocol without IV  contrast. RADIATION DOSE REDUCTION: This exam was performed according to the departmental dose-optimization program which includes automated exposure control, adjustment of the mA and/or kV according to patient size and/or use of iterative reconstruction technique. COMPARISON:  CT abdomen pelvis 04/09/2022. FINDINGS: Lower chest: Nonspecific trace pericardial effusion. No acute abnormality. Hepatobiliary: No focal liver abnormality. Status post cholecystectomy. No biliary dilatation. Pancreas: No focal lesion. Normal pancreatic contour. No surrounding inflammatory changes. No main pancreatic ductal dilatation. Spleen: Normal in size without focal abnormality. Adrenals/Urinary Tract: No adrenal nodule bilaterally. No nephrolithiasis and no hydronephrosis. No definite contour-deforming renal mass. No ureterolithiasis or hydroureter. The urinary bladder is unremarkable. Stomach/Bowel: Stomach is within normal limits. No evidence of bowel wall thickening or dilatation. Stool throughout the ascending, transverse, descending colon. Appendix appears normal. Vascular/Lymphatic: No abdominal aorta or iliac aneurysm. Mild atherosclerotic plaque of the aorta and its branches. No abdominal, pelvic, or inguinal lymphadenopathy. Reproductive: Status  post hysterectomy. No adnexal masses. Other: No intraperitoneal free fluid. No intraperitoneal free gas. No organized fluid collection. Musculoskeletal: No abdominal wall hernia or abnormality. No suspicious lytic or blastic osseous lesions. No acute displaced fracture. Right hip degenerative changes. IMPRESSION: 1. No acute intra-abdominal or intrapelvic abnormality with limited evaluation on this noncontrast study. 2.  Aortic Atherosclerosis (ICD10-I70.0). Electronically Signed   By: Iven Finn M.D.   On: 05/20/2022 23:35   CT HEAD WO CONTRAST (5MM)  Result Date: 05/18/2022 CLINICAL DATA:  Generalized frontal headache. EXAM: CT HEAD WITHOUT CONTRAST TECHNIQUE: Contiguous  axial images were obtained from the base of the skull through the vertex without intravenous contrast. RADIATION DOSE REDUCTION: This exam was performed according to the departmental dose-optimization program which includes automated exposure control, adjustment of the mA and/or kV according to patient size and/or use of iterative reconstruction technique. COMPARISON:  November 04, 2019 FINDINGS: Brain: There is mild cerebral atrophy with widening of the extra-axial spaces and ventricular dilatation. There are areas of decreased attenuation within the white matter tracts of the supratentorial brain, consistent with microvascular disease changes. Vascular: No hyperdense vessel or unexpected calcification. Skull: Normal. Negative for fracture or focal lesion. Sinuses/Orbits: No acute finding. Other: None. IMPRESSION: No acute intracranial abnormality. Electronically Signed   By: Virgina Norfolk M.D.   On: 05/18/2022 22:23    Microbiology: No results found for this or any previous visit.  Labs: CBC: Recent Labs  Lab 05/24/22 1441 05/25/22 0615 05/27/22 0112 05/28/22 0528  WBC 8.3 10.9* 11.6* 7.8  HGB 12.4 12.4 12.2 11.4*  HCT 38.2 38.0 37.6 35.0*  MCV 91.0 91.1 92.6 91.4  PLT 291 303 293 428   Basic Metabolic Panel: Recent Labs  Lab 05/24/22 1441 05/25/22 0615 05/27/22 0112 05/28/22 0528  NA 137 133* 134* 140  K 4.0 3.6 4.0 4.0  CL 101 100 101 106  CO2 _0 GLUCOSE 163* 149* 122* 135*  BUN _1 7*  CREATININE 1.04* 0.96 1.04* 0.89  CALCIUM 9.8 9.8 9.7 9.5   Liver Function Tests: Recent Labs  Lab 05/24/22 1441 05/25/22 0615 05/27/22 0112  AST 33 34 35  ALT 30 32 30  ALKPHOS 71 74 64  BILITOT 0.8 0.6 0.6  PROT 7.4 7.9 7.5  ALBUMIN 4.3 4.5 4.3   CBG: Recent Labs  Lab 05/27/22 1612 05/27/22 2010 05/28/22 0120 05/28/22 0402 05/28/22 0847  GLUCAP 121* 117* 111* 118* 154*    Discharge time spent: greater than 30 minutes.  Signed: Jennye Boroughs,  MD [facility] 05/28/2022

## 2022-06-02 DIAGNOSIS — J449 Chronic obstructive pulmonary disease, unspecified: Secondary | ICD-10-CM | POA: Insufficient documentation

## 2022-06-02 DIAGNOSIS — E119 Type 2 diabetes mellitus without complications: Secondary | ICD-10-CM | POA: Diagnosis not present

## 2022-06-02 DIAGNOSIS — R0602 Shortness of breath: Secondary | ICD-10-CM | POA: Insufficient documentation

## 2022-06-02 DIAGNOSIS — R079 Chest pain, unspecified: Secondary | ICD-10-CM | POA: Insufficient documentation

## 2022-06-02 DIAGNOSIS — I10 Essential (primary) hypertension: Secondary | ICD-10-CM | POA: Insufficient documentation

## 2022-06-02 DIAGNOSIS — K5900 Constipation, unspecified: Secondary | ICD-10-CM | POA: Diagnosis not present

## 2022-06-02 DIAGNOSIS — R109 Unspecified abdominal pain: Secondary | ICD-10-CM | POA: Diagnosis present

## 2022-06-03 ENCOUNTER — Emergency Department: Payer: Medicare Other

## 2022-06-03 ENCOUNTER — Emergency Department
Admission: EM | Admit: 2022-06-03 | Discharge: 2022-06-03 | Disposition: A | Discharge status: Home / Self Care | Payer: Medicare Other | Attending: Emergency Medicine | Admitting: Emergency Medicine

## 2022-06-03 ENCOUNTER — Other Ambulatory Visit: Payer: Self-pay

## 2022-06-03 DIAGNOSIS — R1084 Generalized abdominal pain: Secondary | ICD-10-CM

## 2022-06-03 DIAGNOSIS — K5900 Constipation, unspecified: Secondary | ICD-10-CM

## 2022-06-03 LAB — URINALYSIS, ROUTINE W REFLEX MICROSCOPIC
Bilirubin Urine: NEGATIVE
Glucose, UA: NEGATIVE mg/dL
Hgb urine dipstick: NEGATIVE
Ketones, ur: NEGATIVE mg/dL
Nitrite: NEGATIVE
Protein, ur: NEGATIVE mg/dL
Specific Gravity, Urine: <1.005 — ABNORMAL LOW (ref 1.005–1.030)
pH: 6 (ref 5.0–8.0)

## 2022-06-03 LAB — TROPONIN I (HIGH SENSITIVITY)
Troponin I (High Sensitivity): 4 ng/L (ref <18)
Troponin I (High Sensitivity): 4 ng/L (ref <18)

## 2022-06-03 LAB — URINALYSIS, MICROSCOPIC (REFLEX): RBC / HPF: NONE SEEN RBC/hpf (ref 0–5)

## 2022-06-03 LAB — CBC
HCT: 36.7 % (ref 36.0–46.0)
Hemoglobin: 11.7 g/dL — ABNORMAL LOW (ref 12.0–15.0)
MCH: 29.5 pg (ref 26.0–34.0)
MCHC: 31.9 g/dL (ref 30.0–36.0)
MCV: 92.4 fL (ref 80.0–100.0)
Platelets: 332 10*3/uL (ref 150–400)
RBC: 3.97 MIL/uL (ref 3.87–5.11)
RDW: 12.7 % (ref 11.5–15.5)
WBC: 9.9 10*3/uL (ref 4.0–10.5)
nRBC: 0 % (ref 0.0–0.2)

## 2022-06-03 LAB — BASIC METABOLIC PANEL
Anion gap: 11 (ref 5–15)
BUN: 9 mg/dL (ref 8–23)
CO2: 23 mmol/L (ref 22–32)
Calcium: 9.9 mg/dL (ref 8.9–10.3)
Chloride: 102 mmol/L (ref 98–111)
Creatinine, Ser: 0.79 mg/dL (ref 0.44–1.00)
GFR, Estimated: >60 mL/min (ref >60)
Glucose, Bld: 123 mg/dL — ABNORMAL HIGH (ref 70–99)
Potassium: 3.4 mmol/L — ABNORMAL LOW (ref 3.5–5.1)
Sodium: 136 mmol/L (ref 135–145)

## 2022-06-03 MED ORDER — DICYCLOMINE HCL 10 MG PO CAPS
20.0000 mg | ORAL_CAPSULE | Freq: Once | ORAL | Status: AC
  Administered 2022-06-03: 20 mg via ORAL
  Filled 2022-06-03: qty 2

## 2022-06-03 NOTE — Discharge Instructions (Signed)
As we discussed, please use MiraLAX for your constipation.  Like we talked about, you can use 2 capfuls of MiraLAX per dose multiple times per day if needed for this more severe constipation.  Once your bowels start moving and to avoid diarrhea, you could then cut back to 1-2 capfuls per day.  Continue your other medications.  Use Tylenol and Bentyl for pain.  Bentyl can be used 4 times per day. Use Tylenol for pain and fevers.  Up to 1000 mg per dose, up to 4 times per day.  Do not take more than 4000 mg of Tylenol/acetaminophen within 24 hours.Marland Kitchen

## 2022-06-03 NOTE — ED Provider Triage Note (Signed)
Emergency Medicine Provider Triage Evaluation Note  Destiny Savage , a 71 y.o. female  was evaluated in triage.  Pt complains of acute on chronic abdominal pain.  Similar as when she was admitted last week for the same.  Reports Bentyl is the only thing that helps.  Thought to be attributed to IBS versus constipation on recent GI evaluation..  Review of Systems  Positive:  Negative:   Physical Exam  BP (!) 135/118 (BP Location: Left Arm)   Pulse 74   Temp 100.3 F (37.9 C) (Oral)   Resp 20   Ht 5\' 4"  (1.626 m)   Wt 96.6 kg   SpO2 100%   BMI 36.56 kg/m  Gen:   Awake, no distress   Resp:  Normal effort  MSK:   Moves extremities without difficulty  Other:    Medical Decision Making  Medically screening exam initiated at 12:48 AM.  Appropriate orders placed.  Destiny Savage was informed that the remainder of the evaluation will be completed by another provider, this initial triage assessment does not replace that evaluation, and the importance of remaining in the ED until their evaluation is complete.     Rhea Belton, MD 06/03/22 [phone]

## 2022-06-03 NOTE — ED Triage Notes (Signed)
Patient reports RLQ pain that radiates to back. Reports she is also having chest pain and shortness of breath. States she was recently discharged for same but pain has worsened since discharge.

## 2022-06-03 NOTE — ED Provider Notes (Signed)
[facility] Provider Note    Event Date/Time   First MD Initiated Contact with Patient 06/03/22 [phone]     (approximate)   History   Abdominal Pain, Chest Pain, and Shortness of Breath   HPI  Destiny Savage is a 71 y.o. female who presents to the ED for evaluation of Abdominal Pain, Chest Pain, and Shortness of Breath   I reviewed medical DC summary from 7/11.  History of HTN, DM, COPD and chronic constipation.  She was admitted for intractable abdominal pain nausea and emesis.  Extensive work-up, and I review 7/10 GI evaluation.  Suspect her discomfort is related to IBS and constipation.  She presents to the ED today with her husband for evaluation of continued acute on chronic pain.  She reports "the same symptoms that had been here last time."  She reports recurrence of this pain this evening, but has not taken any of her Bentyl since that time.  She reports a bowel movement yesterday that was small-volume and fairly loose.  Denies any emesis or fever or dysuria.   Physical Exam   Triage Vital Signs: ED Triage Vitals  Enc Vitals Group     BP 06/03/22 0032 (!) 135/118     Pulse Rate 06/03/22 0032 74     Resp 06/03/22 0032 20     Temp 06/03/22 0032 100.3 F (37.9 C)     Temp Source 06/03/22 0032 Oral     SpO2 06/03/22 0032 100 %     Weight 06/03/22 0033 213 lb (96.6 kg)     Height 06/03/22 0033 5\' 4"  (1.626 m)     Head Circumference --      Peak Flow --      Pain Score 06/03/22 0033 10     Pain Loc --      Pain Edu? --      Excl. in GC? --     Most recent vital signs: Vitals:   06/03/22 0032 06/03/22 0530  BP: (!) 135/118 (!) 154/83  Pulse: 74 63  Resp: 20   Temp: 100.3 F (37.9 C)   SpO2: 100% 100%    General: Awake, no distress.  CV:  Good peripheral perfusion.  Resp:  Normal effort.  Abd:  No distention.  Mild and poorly localizing diffuse abdominal tenderness without localizing features or peritoneal features.  No  guarding. MSK:  No deformity noted.  Neuro:  No focal deficits appreciated. Other:     ED Results / Procedures / Treatments   Labs (all labs ordered are listed, but only abnormal results are displayed) Labs Reviewed  BASIC METABOLIC PANEL - Abnormal; Notable for the following components:      Result Value   Potassium 3.4 (*)    Glucose, Bld 123 (*)    All other components within normal limits  CBC - Abnormal; Notable for the following components:   Hemoglobin 11.7 (*)    All other components within normal limits  URINALYSIS, ROUTINE W REFLEX MICROSCOPIC - Abnormal; Notable for the following components:   Specific Gravity, Urine <1.005 (*)    Leukocytes,Ua SMALL (*)    All other components within normal limits  URINALYSIS, MICROSCOPIC (REFLEX) - Abnormal; Notable for the following components:   Bacteria, UA FEW (*)    All other components within normal limits  TROPONIN I (HIGH SENSITIVITY)  TROPONIN I (HIGH SENSITIVITY)    EKG Sinus rhythm with a rate of 72 bpm.  Normal axis and intervals.  No clear signs of acute ischemia.  RADIOLOGY CT abdomen/pelvis interpreted by me with chronic constipation without signs of SBO or inflammatory changes. CXR interpreted by me without evidence of acute cardiopulmonary pathology.  Official radiology report(s): CT ABDOMEN PELVIS WO CONTRAST  Result Date: 06/03/2022 CLINICAL DATA:  Acute on chronic abdominal pain, vomiting. Evaluate small bowel obstruction. EXAM: CT ABDOMEN AND PELVIS WITHOUT CONTRAST TECHNIQUE: Multidetector CT imaging of the abdomen and pelvis was performed following the standard protocol without IV contrast. RADIATION DOSE REDUCTION: This exam was performed according to the departmental dose-optimization program which includes automated exposure control, adjustment of the mA and/or kV according to patient size and/or use of iterative reconstruction technique. COMPARISON:  CT with contrast 05/25/2022, CT without contrast  05/20/2022. FINDINGS: Lower chest: Chronic eventration and asymmetric elevation right hemidiaphragm. Scattered linear scarring or atelectasis both lung bases without infiltrates. The cardiac size is normal. Small chronic pericardial effusion again noted with three-vessel moderate to heavy calcific CAD. Hepatobiliary: Surgically absent gallbladder. No biliary dilatation. No focal abnormality in the unenhanced liver. Pancreas: No focal abnormality or inflammatory change. Spleen: No focal abnormality or splenomegaly. Adrenals/Urinary Tract: There is no adrenal mass. No focal abnormality in the unenhanced renal cortex. There is a punctate nonobstructive caliceal stone in the superior pole left kidney. No ureteral stones or hydronephrosis are seen. There is no bladder thickening. Stomach/Bowel: No dilatation or wall thickening. Normal appendix. Moderate increased stool retention compared with the last CT but similar to 05/20/2022. No evidence of colitis or diverticulitis. Vascular/Lymphatic: No significant vascular findings are present. No enlarged abdominal or pelvic lymph nodes. Reproductive: Status post hysterectomy. No adnexal masses. Other: There is no incarcerated hernia. There is no free air, hemorrhage or free fluid. Musculoskeletal: Slight lumbar dextroscoliosis. Asymmetric right hip DJD. No acute or other significant osseous findings. IMPRESSION: 1. Increased constipation compared to the last CT but no bowel obstruction, inflammatory changes or visible wall thickening. 2. The patient underwent four other CTs this year for similar symptoms. Agree with prior recommendation for follow-up colonoscopy to assess for occult underlying lesion, unless already performed. 3. 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 4. Colonic diverticula without evidence of diverticulitis. 5. Nonobstructive micronephrolithiasis. Electronically Signed   By: Almira Bar M.D.   On: 06/03/2022 06:13   DG Chest 2  View  Result Date: 06/03/2022 CLINICAL DATA:  Chest pain and shortness of breath. EXAM: CHEST - 2 VIEW COMPARISON:  PA chest 05/27/2022. FINDINGS: The heart size and mediastinal contours are within normal limits. Both lungs are clear. The visualized skeletal structures are intact. There is thoracic spondylosis and bridging enthesopathy. IMPRESSION: No active cardiopulmonary disease.  Stable chest. Electronically Signed   By: Almira Bar M.D.   On: 06/03/2022 01:03    PROCEDURES and INTERVENTIONS:  .1-3 Lead EKG Interpretation  Performed by: Delton Prairie, MD Authorized by: Delton Prairie, MD     Interpretation: normal     ECG rate:  60   ECG rate assessment: normal     Rhythm: sinus rhythm     Ectopy: none     Conduction: normal     Medications  dicyclomine (BENTYL) capsule 20 mg (20 mg Oral Given 06/03/22 0119)  dicyclomine (BENTYL) capsule 20 mg (20 mg Oral Given 06/03/22 0548)     IMPRESSION / MDM / ASSESSMENT AND PLAN / ED COURSE  I reviewed the triage vital signs and the nursing notes.  Differential diagnosis includes, but is not limited to, constipation, ACS, SBO, acute cystitis,  dehydration  {Patient presents with symptoms of an acute illness or injury that is potentially life-threatening.  71 year old female presents to the ED with acute on chronic abdominal pain, possibly due to constipation and ultimately suitable for outpatient management with improved bowel regimen.  She is noted to have a temperature of 100.3 F, but no further stigmata of SIRS criteria or sepsis.  No infectious symptoms clearly.  Urine without infectious features.  CXR is clear.  Reassuring and essentially normal CBC, metabolic panel and 2 troponins. I discussed with her her multiple CT scans in the past few months and the possibility of empiric treatment of constipation and IBS, but she reports that she "has to know" and request another CT scan which was performed and as above.  As indicated below, we  discussed using Bentyl and MiraLAX to help with her symptoms, following up with GI and we discussed appropriate return precautions for the ED.  Clinical Course as of 06/03/22 0631  Mon Jun 03, 2022  0529 Reassessed. Feeling much better after the bentyl.  Reports pain improved from a 10 -> 6.  No emesis since she has been waiting.  We discussed plan of care and shared decision making with plan to pursue a CT abdomen/pelvis [DS]  0626 Reassessed.  Feeling much better now.  We discussed CT results.  We discussed managing constipation at home.  We discussed titrating MiraLAX to effect.  We discussed return precautions for the ED and following up with the GI specialist.  Answered questions [DS]    Clinical Course User Index [DS] Delton Prairie, MD     FINAL CLINICAL IMPRESSION(S) / ED DIAGNOSES   Final diagnoses:  Generalized abdominal pain  Constipation, unspecified constipation type     Rx / DC Orders   ED Discharge Orders     None        Note:  This document was prepared using Dragon voice recognition software and may include unintentional dictation errors.   Delton Prairie, MD 06/03/22 [phone]

## 2022-06-04 NOTE — Progress Notes (Unsigned)
Cardiology Office Note  Date:  06/05/2022   ID:  Destiny Savage, DOB 1950-11-29, MRN [id number]  PCP:  Albina Billet, MD   Chief Complaint  Patient presents with   12 month follow up     Patient c/o mild chest discomfort that comes and goes. Medications reviewed by the patient verbally.     HPI:  Destiny Savage is a 71 y.o. female with history of  TIA/stroke,  DM2,  HTN,  HLD,  long history of nausea/vomiting/diarrhea without etiology, previously seen by GI at [facility] previous symptoms of chest discomfort,  Last stress test February 2017 showing no ischemia OSA, Not using her CPAP who presents for routine follow-up of her chest pain symptoms  Last seen in clinic by myself May 2022  Frequent trips to the emergency room for abdominal pain Chronic constipation Takes miralex and lactulose  Weight down 12 pounds in one year  Reports she is taking Lasix and potassium daily Denies shortness of breath, no leg swelling Prior cardiac work-up echocardiogram showing normal ejection fraction Denies PND, orthopnea Review of notes does not indicate history of congestive heart failure  No chest pain concerning for angina  Lab work reviewed  HAB1C 6.6 Total CHOL 150, old number  EKG personally reviewed by myself on todays visit Shows normal sinus rhythm rate 74 okay bpm no significant ST or T wave changes  Other past medical history reviewed Prior office visit, frequent headaches In the ER 12/16/17 and 11/24/2017  for H/A MRI normal CT head ok Was given a prescription for Fioricet  Stress at home Husband with cancer, CLL? On chemo, XRT  hospital 01/15/17,  H/A with nausea Tingling in legs CT head with no acute findings MRI head with small vessel disease MRI lumbar spine, mild DJD Carotid <49% disease  She is discharged without clear diagnosis   lost her mother November 2017  Admission to [facility] 12/24/2015 with chest pain Negative cardiac enzymes, EKG relatively  unchanged  Echo showed EF 60-65%, no RWMA, GR1DD, PASP 47 mm Hg, and mild to moderate posterior pericardial effusion without evidence of hemodynamic compromise.  She remained chest pain free throughout her admission.    stress test 12/2015  no ischemia normal ejection fraction She denies any further episodes of chest pain    admitted to [facility] in 11/2001 for chest pain Dr. Clayborn Bigness performed a cardiac cath that did not show any significant coroanry disease.  She was again admitted to [facility] in 2004 for chest pain and had a nuclear stress test that did not show any ischemia.   normal echo.   PMH:   has a past medical history of Arthritis, Asthma, Diabetes mellitus, GERD (gastroesophageal reflux disease), Hyperlipidemia, Hypertension, Mini stroke, and Stroke ([place]).  PSH:    Past Surgical History:  Procedure Laterality Date   ABDOMINAL HYSTERECTOMY     BREAST EXCISIONAL BIOPSY Left 1980's   benign   CARDIAC CATHETERIZATION     CHOLECYSTECTOMY      Current Outpatient Medications  Medication Sig Dispense Refill   acetaminophen (TYLENOL) 500 MG tablet Take 500-1,000 mg by mouth every 8 (eight) hours as needed.     albuterol (VENTOLIN HFA) 108 (90 Base) MCG/ACT inhaler Inhale 1 puff into the lungs every 6 (six) hours as needed for wheezing.     aspirin 81 MG tablet Take 1 tablet (81 mg total) by mouth daily.     atorvastatin (LIPITOR) 40 MG tablet TAKE 1 TABLET BY MOUTH ONCE DAILY AT  6PM 30 tablet 5   carvedilol (COREG) 6.25 MG tablet Take 1 tablet (6.25 mg total) by mouth 2 (two) times daily with a meal. 180 tablet 3   cholecalciferol (VITAMIN D3) 25 MCG (1000 UNIT) tablet Take 1,000 Units by mouth daily.     clopidogrel (PLAVIX) 75 MG tablet Take 1 tablet (75 mg total) by mouth daily. 90 tablet 3   docusate sodium (COLACE) 100 MG capsule Take 1 capsule (100 mg total) by mouth 2 (two) times daily. (Patient taking differently: Take 100 mg by mouth 2 (two) times daily as needed for mild  constipation.) 60 capsule 2   furosemide (LASIX) 20 MG tablet Take 1 tablet by mouth once daily 90 tablet 0   lactulose (CEPHULAC) 20 g packet Take 1 packet (20 g total) by mouth daily. 30 packet 0   metFORMIN (GLUCOPHAGE-XR) 500 MG 24 hr tablet Take 1,000 mg by mouth daily.     Multiple Vitamin (MULTIVITAMIN WITH MINERALS) TABS tablet Take 1 tablet by mouth daily.     ondansetron (ZOFRAN-ODT) 4 MG disintegrating tablet Take 1 tablet (4 mg total) by mouth every 8 (eight) hours as needed for nausea or vomiting. 20 tablet 0   pantoprazole (PROTONIX) 40 MG tablet Take 40 mg by mouth daily.     polyethylene glycol (MIRALAX) 17 g packet Take 17 g by mouth daily. 14 each 0   potassium chloride (KLOR-CON) 10 MEQ tablet Take 1 tablet by mouth twice daily 60 tablet 0   No current facility-administered medications for this visit.     Allergies:   Latex, Lidocaine, Phenobarbital-belladonna alk, and Phenobarbital-belladonna alk   Social History:  The patient  reports that she has never smoked. She has never used smokeless tobacco. She reports that she does not drink alcohol and does not use drugs.   Family History:   family history includes CAD in an other family member; Heart Problems in her father; Hypertension in an other family member.   Review of Systems: Review of Systems  Constitutional:  Positive for malaise/fatigue.  HENT: Negative.    Respiratory: Negative.    Cardiovascular: Negative.   Gastrointestinal: Negative.   Musculoskeletal: Negative.   Neurological: Negative.   Psychiatric/Behavioral: Negative.    All other systems reviewed and are negative.  PHYSICAL EXAM: VS:  BP 120/64 (BP Location: Left Arm, Patient Position: Sitting, Cuff Size: Normal)   Pulse 74   Ht '5\' 6"'  (1.676 m)   Wt 200 lb (90.7 kg)   SpO2 98%   BMI 32.28 kg/m  , BMI Body mass index is 32.28 kg/m. Constitutional:  oriented to person, place, and time. No distress.  HENT:  Head: Grossly normal Eyes:  no  discharge. No scleral icterus.  Neck: No JVD, no carotid bruits  Cardiovascular: Regular rate and rhythm, no murmurs appreciated Pulmonary/Chest: Clear to auscultation bilaterally, no wheezes or rails Abdominal: Soft.  no distension.  no tenderness.  Musculoskeletal: Normal range of motion Neurological:  normal muscle tone. Coordination normal. No atrophy Skin: Skin warm and dry Psychiatric: normal affect, pleasant  Recent Labs: 04/09/2022: Magnesium 1.1; TSH 2.664 05/27/2022: ALT 30 06/03/2022: BUN 9; Creatinine, Ser 0.79; Hemoglobin 11.7; Platelets 332; Potassium 3.4; Sodium 136    Lipid Panel Lab Results  Component Value Date   CHOL 152 10/04/2019   HDL 60 10/04/2019   LDLCALC 76 10/04/2019   TRIG 86 10/04/2019    Wt Readings from Last 3 Encounters:  06/05/22 200 lb (90.7 kg)  06/03/22 213 lb (  96.6 kg)  05/27/22 201 lb (91.2 kg)     ASSESSMENT AND PLAN:  Essential hypertension -  Blood pressure is well controlled on today's visit. No changes made to the medications.  Mixed hyperlipidemia - Labs requested from PMD, stay on statin  Cerebrovascular accident (CVA), unspecified mechanism ([place])  Long history of headaches, stable previous MRI with no clear finding of stroke Denies any recent headaches  Controlled type 2 diabetes mellitus without complication, without long-term current use of insulin (HCC) - A1C down with weight loss Has changed her diet  Chronic constipation Recommend she decrease Lasix down to 3 days a week with potassium 3 days a week No clear signs of congestive heart failure Recommend she stay on her MiraLAX and lactulose Reports that she has Linzess at home   Total encounter time more than 30 minutes  Greater than 50% was spent in counseling and coordination of care with the patient   No orders of the defined types were placed in this encounter.    Signed, Esmond Plants, M.D., Ph.D. 06/05/2022  [place],  [place]

## 2022-06-05 ENCOUNTER — Ambulatory Visit: Payer: Medicare Other | Admitting: Cardiovascular Disease

## 2022-06-05 ENCOUNTER — Encounter: Payer: Self-pay | Admitting: Cardiovascular Disease

## 2022-06-05 VITALS — BP 120/64 | HR 74 | Ht 66.0 in | Wt 200.0 lb

## 2022-06-05 DIAGNOSIS — Z8673 Personal history of transient ischemic attack (TIA), and cerebral infarction without residual deficits: Secondary | ICD-10-CM

## 2022-06-05 DIAGNOSIS — E782 Mixed hyperlipidemia: Secondary | ICD-10-CM

## 2022-06-05 DIAGNOSIS — E118 Type 2 diabetes mellitus with unspecified complications: Secondary | ICD-10-CM | POA: Diagnosis not present

## 2022-06-05 DIAGNOSIS — I10 Essential (primary) hypertension: Secondary | ICD-10-CM | POA: Diagnosis not present

## 2022-06-05 DIAGNOSIS — R079 Chest pain, unspecified: Secondary | ICD-10-CM

## 2022-06-05 DIAGNOSIS — G4730 Sleep apnea, unspecified: Secondary | ICD-10-CM

## 2022-06-05 MED ORDER — POTASSIUM CHLORIDE ER 10 MEQ PO TBCR: 10.0000 meq | EXTENDED_RELEASE_TABLET | ORAL | 3 refills | Status: DC

## 2022-06-05 MED ORDER — FUROSEMIDE 20 MG PO TABS: 20.0000 mg | ORAL_TABLET | ORAL | 3 refills | Status: DC

## 2022-06-05 MED ORDER — ATORVASTATIN CALCIUM 40 MG PO TABS: 40.0000 mg | ORAL_TABLET | Freq: Every day | ORAL | 3 refills | Status: DC

## 2022-06-05 NOTE — Patient Instructions (Addendum)
Medication Instructions:  Please decrease the lasix and potassium chloride down Monday, Wednesday, Friday, this may help constipation For ankle swelling, you can take lasix as needed   If you need a refill on your cardiac medications before your next appointment, please call your pharmacy.   Lab work: No new labs needed  Testing/Procedures: No new testing needed  Follow-Up: At [facility], you and your health needs are our priority.  As part of our continuing mission to provide you with exceptional heart care, we have created designated Provider Care Teams.  These Care Teams include your primary Cardiologist (physician) and Advanced Practice Providers (APPs -  Physician Assistants and Nurse Practitioners) who all work together to provide you with the care you need, when you need it.  You will need a follow up appointment in 12 months  Providers on your designated Care Team:   Nicolasa Ducking, NP Eula Listen, PA-C Cadence Fransico [place], [place]  COVID-19 Vaccine Information can be found at: [email or web address] For questions related to vaccine distribution or appointments, please email [email or web address] or call [phone].
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
## 2022-07-08 ENCOUNTER — Ambulatory Visit: Payer: Medicare Other | Admitting: Neurology

## 2022-07-08 ENCOUNTER — Encounter: Payer: Self-pay | Admitting: Neurology

## 2022-07-08 VITALS — BP 148/80 | HR 73 | Ht 66.0 in | Wt 201.0 lb

## 2022-07-08 DIAGNOSIS — F0670 Mild neurocognitive disorder due to known physiological condition without behavioral disturbance: Secondary | ICD-10-CM

## 2022-07-08 DIAGNOSIS — G43009 Migraine without aura, not intractable, without status migrainosus: Secondary | ICD-10-CM

## 2022-07-08 DIAGNOSIS — G4733 Obstructive sleep apnea (adult) (pediatric): Secondary | ICD-10-CM

## 2022-07-08 DIAGNOSIS — Z8673 Personal history of transient ischemic attack (TIA), and cerebral infarction without residual deficits: Secondary | ICD-10-CM | POA: Diagnosis not present

## 2022-07-08 DIAGNOSIS — G309 Alzheimer's disease, unspecified: Secondary | ICD-10-CM | POA: Diagnosis not present

## 2022-07-08 MED ORDER — DONEPEZIL HCL 5 MG PO TABS: 5.0000 mg | ORAL_TABLET | Freq: Every day | ORAL | 0 refills | Status: DC

## 2022-07-08 MED ORDER — NORTRIPTYLINE HCL 10 MG PO CAPS: 10.0000 mg | ORAL_CAPSULE | Freq: Every day | ORAL | 5 refills | Status: DC

## 2022-07-08 NOTE — Patient Instructions (Addendum)
Restart nortriptyline 10mg  at bedtime NOW 2.  IN TWO WEEKS, start start donepezil (Aricept) 5mg  daily for four weeks.  If you are tolerating the medication, then after four weeks, we will increase the dose to 10mg  daily.  Side effects include nausea, vomiting, diarrhea, vivid dreams, and muscle cramps.  Please call the clinic if you experience any of these symptoms. 3.  Use CPAP 4.  Follow up 6 months.

## 2022-08-31 ENCOUNTER — Other Ambulatory Visit: Payer: Self-pay | Admitting: Cardiovascular Disease

## 2022-09-02 ENCOUNTER — Other Ambulatory Visit: Payer: Self-pay | Admitting: Cardiovascular Disease

## 2022-09-08 ENCOUNTER — Other Ambulatory Visit: Payer: Self-pay

## 2022-09-08 ENCOUNTER — Emergency Department: Payer: Medicare Other

## 2022-09-08 ENCOUNTER — Observation Stay
Admission: EM | Admit: 2022-09-08 | Discharge: 2022-09-09 | Disposition: A | Discharge status: Home / Self Care | Payer: Medicare Other | Attending: Internal Medicine | Admitting: Internal Medicine

## 2022-09-08 ENCOUNTER — Encounter: Payer: Self-pay | Admitting: Emergency Medicine

## 2022-09-08 DIAGNOSIS — E782 Mixed hyperlipidemia: Secondary | ICD-10-CM | POA: Diagnosis present

## 2022-09-08 DIAGNOSIS — R109 Unspecified abdominal pain: Secondary | ICD-10-CM | POA: Diagnosis present

## 2022-09-08 DIAGNOSIS — Z79899 Other long term (current) drug therapy: Secondary | ICD-10-CM | POA: Insufficient documentation

## 2022-09-08 DIAGNOSIS — E119 Type 2 diabetes mellitus without complications: Secondary | ICD-10-CM | POA: Diagnosis not present

## 2022-09-08 DIAGNOSIS — Z91040 Latex allergy status: Secondary | ICD-10-CM | POA: Diagnosis not present

## 2022-09-08 DIAGNOSIS — I10 Essential (primary) hypertension: Secondary | ICD-10-CM | POA: Diagnosis not present

## 2022-09-08 DIAGNOSIS — Q438 Other specified congenital malformations of intestine: Secondary | ICD-10-CM | POA: Insufficient documentation

## 2022-09-08 DIAGNOSIS — Z7982 Long term (current) use of aspirin: Secondary | ICD-10-CM | POA: Insufficient documentation

## 2022-09-08 DIAGNOSIS — Z7984 Long term (current) use of oral hypoglycemic drugs: Secondary | ICD-10-CM | POA: Insufficient documentation

## 2022-09-08 DIAGNOSIS — J45909 Unspecified asthma, uncomplicated: Secondary | ICD-10-CM | POA: Insufficient documentation

## 2022-09-08 DIAGNOSIS — Z8673 Personal history of transient ischemic attack (TIA), and cerebral infarction without residual deficits: Secondary | ICD-10-CM | POA: Diagnosis not present

## 2022-09-08 DIAGNOSIS — K5900 Constipation, unspecified: Secondary | ICD-10-CM | POA: Diagnosis not present

## 2022-09-08 DIAGNOSIS — K5901 Slow transit constipation: Secondary | ICD-10-CM | POA: Diagnosis not present

## 2022-09-08 DIAGNOSIS — K219 Gastro-esophageal reflux disease without esophagitis: Secondary | ICD-10-CM | POA: Diagnosis present

## 2022-09-08 DIAGNOSIS — Z7901 Long term (current) use of anticoagulants: Secondary | ICD-10-CM | POA: Diagnosis not present

## 2022-09-08 DIAGNOSIS — K5909 Other constipation: Secondary | ICD-10-CM | POA: Diagnosis present

## 2022-09-08 LAB — CBC
HCT: 35.8 % — ABNORMAL LOW (ref 36.0–46.0)
Hemoglobin: 11.3 g/dL — ABNORMAL LOW (ref 12.0–15.0)
MCH: 29.7 pg (ref 26.0–34.0)
MCHC: 31.6 g/dL (ref 30.0–36.0)
MCV: 94 fL (ref 80.0–100.0)
Platelets: 242 10*3/uL (ref 150–400)
RBC: 3.81 MIL/uL — ABNORMAL LOW (ref 3.87–5.11)
RDW: 13.3 % (ref 11.5–15.5)
WBC: 8.5 10*3/uL (ref 4.0–10.5)
nRBC: 0 % (ref 0.0–0.2)

## 2022-09-08 LAB — LIPASE, BLOOD: Lipase: 28 U/L (ref 11–51)

## 2022-09-08 LAB — URINALYSIS, ROUTINE W REFLEX MICROSCOPIC
Bilirubin Urine: NEGATIVE
Glucose, UA: NEGATIVE mg/dL
Hgb urine dipstick: NEGATIVE
Ketones, ur: NEGATIVE mg/dL
Leukocytes,Ua: NEGATIVE
Nitrite: NEGATIVE
Protein, ur: NEGATIVE mg/dL
Specific Gravity, Urine: 1.006 (ref 1.005–1.030)
pH: 5 (ref 5.0–8.0)

## 2022-09-08 LAB — COMPREHENSIVE METABOLIC PANEL
ALT: 13 U/L (ref 0–44)
AST: 23 U/L (ref 15–41)
Albumin: 3.8 g/dL (ref 3.5–5.0)
Alkaline Phosphatase: 64 U/L (ref 38–126)
Anion gap: 7 (ref 5–15)
BUN: 10 mg/dL (ref 8–23)
CO2: 25 mmol/L (ref 22–32)
Calcium: 9.3 mg/dL (ref 8.9–10.3)
Chloride: 106 mmol/L (ref 98–111)
Creatinine, Ser: 0.81 mg/dL (ref 0.44–1.00)
GFR, Estimated: >60 mL/min (ref >60)
Glucose, Bld: 142 mg/dL — ABNORMAL HIGH (ref 70–99)
Potassium: 3.9 mmol/L (ref 3.5–5.1)
Sodium: 138 mmol/L (ref 135–145)
Total Bilirubin: 0.3 mg/dL (ref 0.3–1.2)
Total Protein: 6.6 g/dL (ref 6.5–8.1)

## 2022-09-08 MED ORDER — LACTATED RINGERS IV SOLN: INTRAVENOUS | Status: DC

## 2022-09-08 MED ORDER — ONDANSETRON HCL 4 MG PO TABS: 4.0000 mg | ORAL_TABLET | Freq: Four times a day (QID) | ORAL | Status: DC | PRN

## 2022-09-08 MED ORDER — LACTULOSE 10 GM/15ML PO SOLN
20.0000 g | Freq: Once | ORAL | Status: AC
  Administered 2022-09-08: 20 g via ORAL
  Filled 2022-09-08: qty 30

## 2022-09-08 MED ORDER — ATORVASTATIN CALCIUM 20 MG PO TABS
40.0000 mg | ORAL_TABLET | Freq: Every evening | ORAL | Status: DC
  Administered 2022-09-08: 40 mg via ORAL
  Filled 2022-09-08: qty 2

## 2022-09-08 MED ORDER — PANTOPRAZOLE SODIUM 40 MG PO TBEC: 40.0000 mg | DELAYED_RELEASE_TABLET | Freq: Every day | ORAL | Status: DC | PRN

## 2022-09-08 MED ORDER — ACETAMINOPHEN 325 MG PO TABS: 650.0000 mg | ORAL_TABLET | Freq: Four times a day (QID) | ORAL | Status: DC | PRN

## 2022-09-08 MED ORDER — POLYETHYLENE GLYCOL 3350 17 G PO PACK
17.0000 g | PACK | Freq: Once | ORAL | Status: AC
Start: 2022-09-08 — End: 2022-09-08
  Administered 2022-09-08: 17 g via ORAL
  Filled 2022-09-08: qty 1

## 2022-09-08 MED ORDER — SODIUM CHLORIDE 0.9 % IV BOLUS
500.0000 mL | Freq: Once | INTRAVENOUS | Status: AC
  Administered 2022-09-08: 500 mL via INTRAVENOUS

## 2022-09-08 MED ORDER — SORBITOL 70 % SOLN
960.0000 mL | TOPICAL_OIL | Freq: Once | ORAL | Status: AC
  Administered 2022-09-08: 960 mL via RECTAL
  Filled 2022-09-08: qty 240

## 2022-09-08 MED ORDER — HEPARIN SODIUM (PORCINE) 5000 UNIT/ML IJ SOLN
5000.0000 [IU] | Freq: Three times a day (TID) | INTRAMUSCULAR | Status: DC
  Administered 2022-09-08 – 2022-09-09 (×2): 5000 [IU] via SUBCUTANEOUS
  Filled 2022-09-08 (×2): qty 1

## 2022-09-08 MED ORDER — ALBUTEROL SULFATE (2.5 MG/3ML) 0.083% IN NEBU: 3.0000 mL | INHALATION_SOLUTION | Freq: Four times a day (QID) | RESPIRATORY_TRACT | Status: DC | PRN

## 2022-09-08 MED ORDER — PEG 3350-KCL-NA BICARB-NACL 420 G PO SOLR
4000.0000 mL | Freq: Once | ORAL | Status: AC
  Administered 2022-09-08: 4000 mL via ORAL
  Filled 2022-09-08: qty 4000

## 2022-09-08 MED ORDER — ONDANSETRON HCL 4 MG/2ML IJ SOLN
4.0000 mg | Freq: Four times a day (QID) | INTRAMUSCULAR | Status: DC | PRN
  Administered 2022-09-08: 4 mg via INTRAVENOUS
  Filled 2022-09-08: qty 2

## 2022-09-08 MED ORDER — FUROSEMIDE 40 MG PO TABS
20.0000 mg | ORAL_TABLET | ORAL | Status: DC
  Administered 2022-09-09: 20 mg via ORAL
  Filled 2022-09-08: qty 1

## 2022-09-08 MED ORDER — IOHEXOL 300 MG/ML  SOLN
100.0000 mL | Freq: Once | INTRAMUSCULAR | Status: AC | PRN
Start: 2022-09-08 — End: 2022-09-08
  Administered 2022-09-08: 100 mL via INTRAVENOUS

## 2022-09-08 MED ORDER — ACETAMINOPHEN 650 MG RE SUPP: 650.0000 mg | Freq: Four times a day (QID) | RECTAL | Status: DC | PRN

## 2022-09-08 MED ORDER — CLOPIDOGREL BISULFATE 75 MG PO TABS
75.0000 mg | ORAL_TABLET | Freq: Every day | ORAL | Status: DC
  Administered 2022-09-08 – 2022-09-09 (×2): 75 mg via ORAL
  Filled 2022-09-08 (×2): qty 1

## 2022-09-08 MED ORDER — FENTANYL CITRATE PF 50 MCG/ML IJ SOSY
50.0000 ug | PREFILLED_SYRINGE | Freq: Once | INTRAMUSCULAR | Status: AC
  Administered 2022-09-08: 50 ug via INTRAVENOUS
  Filled 2022-09-08: qty 1

## 2022-09-08 MED ORDER — CARVEDILOL 6.25 MG PO TABS
6.2500 mg | ORAL_TABLET | Freq: Two times a day (BID) | ORAL | Status: DC
  Administered 2022-09-08 – 2022-09-09 (×2): 6.25 mg via ORAL
  Filled 2022-09-08 (×2): qty 1

## 2022-09-08 MED ORDER — MORPHINE SULFATE (PF) 2 MG/ML IV SOLN: 2.0000 mg | INTRAVENOUS | Status: DC | PRN

## 2022-09-08 MED ORDER — FENTANYL CITRATE PF 50 MCG/ML IJ SOSY: 12.5000 ug | PREFILLED_SYRINGE | INTRAMUSCULAR | Status: DC | PRN

## 2022-09-08 MED ORDER — ONDANSETRON HCL 4 MG/2ML IJ SOLN
4.0000 mg | Freq: Once | INTRAMUSCULAR | Status: AC
  Administered 2022-09-08: 4 mg via INTRAVENOUS
  Filled 2022-09-08: qty 2

## 2022-09-08 NOTE — ED Notes (Signed)
Pt reports a medium to large BM. Pt flushed it before I could see it. RN did notice some redness on the toilet paper when wiping. Spouse of pt called for help when pt was on the toilet. Pt stated filling nauseated and filling like she was going to faint. Possibly vaguled. RN assisted pt back to bed and obtained vitals. Pt states she is filling better. Provider notified and pt provided with water.

## 2022-09-08 NOTE — ED Notes (Signed)
Pt states coming in due to abdominal pain. Pt states on and off for the last 3 months. Pt reports taking medications and an enema to help with the pain. Pt states she did have a bowel movement after the enema yesterday. Pt states constant nausea and a loss of apatite. On assessment pt reports tenderness to the LUQ and LLQ of the abdomen. S1 and S2 heard on ascultation. EDP at bedside currently.

## 2022-09-08 NOTE — Assessment & Plan Note (Signed)
-   Atorvastatin 40 mg daily resumed 

## 2022-09-08 NOTE — ED Provider Notes (Signed)
[facility] Provider Note    Event Date/Time   First MD Initiated Contact with Patient 09/08/22 [phone]     (approximate)   History   Abdominal Pain, Constipation, and Rash (Possible Shingles)   HPI  Destiny Savage is a 71 y.o. female here with multiple complaints.  Patient primary complaint is left-sided abdominal pain.  The patient states that for the last week, she has had worsening aching, throbbing, left-sided abdominal pain.  Is worse with any attempted eating.  She said decreased bowel movement frequency and has been trying to take her lactulose as well as a laxative without significant relief.  She has aching, throbbing pain with this.  She denies any known fevers or chills.  Denies any urinary symptoms.  She also complains of a painful rash along her left upper chest, which had some small vesicles but now seems to be drying.  This pain has been fairly severe.  She states that this combined with the abdomen has been very uncomfortable.  Denies known history of shingles.     Physical Exam   Triage Vital Signs: ED Triage Vitals  Enc Vitals Group     BP 09/08/22 0619 (!) 146/70     Pulse Rate 09/08/22 0619 71     Resp 09/08/22 0619 18     Temp 09/08/22 0619 98.1 F (36.7 C)     Temp Source 09/08/22 0619 Oral     SpO2 09/08/22 0619 98 %     Weight 09/08/22 0626 160 lb (72.6 kg)     Height 09/08/22 0626 5\' 6"  (1.676 m)     Head Circumference --      Peak Flow --      Pain Score 09/08/22 0626 10     Pain Loc --      Pain Edu? --      Excl. in [place]? --     Most recent vital signs: Vitals:   09/08/22 1218 09/08/22 1219  BP: (!) 155/83   Pulse:  61  Resp: 20   Temp:    SpO2:  99%     General: Awake, no distress.  CV:  Good peripheral perfusion.  Regular rate and rhythm. Resp:  Normal effort.  Lungs clear to auscultation bilaterally. Abd:  No distention.  Moderate tenderness to palpation along the left abdomen.  No guarding or  rebound. Other:  No lower extremity edema.   ED Results / Procedures / Treatments   Labs (all labs ordered are listed, but only abnormal results are displayed) Labs Reviewed  COMPREHENSIVE METABOLIC PANEL - Abnormal; Notable for the following components:      Result Value   Glucose, Bld 142 (*)    All other components within normal limits  CBC - Abnormal; Notable for the following components:   RBC 3.81 (*)    Hemoglobin 11.3 (*)    HCT 35.8 (*)    All other components within normal limits  URINALYSIS, ROUTINE W REFLEX MICROSCOPIC - Abnormal; Notable for the following components:   Color, Urine YELLOW (*)    APPearance CLEAR (*)    All other components within normal limits  LIPASE, BLOOD     EKG    RADIOLOGY CT abdomen/pelvis: Similar retained stool in large bowel, especially redundant splenic flexure, descending, and sigmoid colon segments   I also independently reviewed and agree with radiologist interpretations.   PROCEDURES:  Critical Care performed: No   MEDICATIONS ORDERED IN ED: Medications  acetaminophen (TYLENOL)  tablet 650 mg (has no administration in time range)    Or  acetaminophen (TYLENOL) suppository 650 mg (has no administration in time range)  ondansetron (ZOFRAN) tablet 4 mg (has no administration in time range)    Or  ondansetron (ZOFRAN) injection 4 mg (has no administration in time range)  heparin injection 5,000 Units (has no administration in time range)  polyethylene glycol-electrolytes (NuLYTELY) solution 4,000 mL (has no administration in time range)  iohexol (OMNIPAQUE) 300 MG/ML solution 100 mL (100 mLs Intravenous Contrast Given 09/08/22 0840)  sorbitol, milk of mag, mineral oil, glycerin (SMOG) enema (960 mLs Rectal Given 09/08/22 1036)  fentaNYL (SUBLIMAZE) injection 50 mcg (50 mcg Intravenous Given 09/08/22 0928)  ondansetron (ZOFRAN) injection 4 mg (4 mg Intravenous Given 09/08/22 0927)  polyethylene glycol (MIRALAX / GLYCOLAX)  packet 17 g (17 g Oral Given 09/08/22 1149)  sodium chloride 0.9 % bolus 500 mL (500 mLs Intravenous New Bag/Given 09/08/22 1256)  lactulose (CHRONULAC) 10 GM/15ML solution 20 g (20 g Oral Given 09/08/22 1255)     IMPRESSION / MDM / ASSESSMENT AND PLAN / ED COURSE  I reviewed the triage vital signs and the nursing notes.                               The patient is on the cardiac monitor to evaluate for evidence of arrhythmia and/or significant heart rate changes.   Ddx:  Differential includes the following, with pertinent life- or limb-threatening emergencies considered:  Recurrent ileus with severe constipation, small obstruction, diverticulitis, volvulus, mass, electrolyte abnormality such as hypokalemia, polypharmacy  Patient's presentation is most consistent with acute presentation with potential threat to life or bodily function.  MDM:  71 year old female with history of recurrent constipation here with severe abdominal pain and nausea.  CBC shows no leukocytosis or anemia.  CMP is unremarkable, LFTs and renal function.  Lipase is normal.  Urinalysis negative for UTI.  CT of the abdomen and pelvis obtained, reviewed, shows severe constipation with redundant colon throughout the splenic flexure, descending, and sigmoid colon segments.  Patient given aggressive bowel regimen including smog enema, MiraLAX, lactulose which was taken prior to arrival as well, with production of only a small amount of stool.  Patient also had significant nausea and a likely vasovagal episode after attempting have a bowel movement in the ED.  Will admit for closer monitoring, hydration, and improved bowel regimen.   MEDICATIONS GIVEN IN ED: Medications  acetaminophen (TYLENOL) tablet 650 mg (has no administration in time range)    Or  acetaminophen (TYLENOL) suppository 650 mg (has no administration in time range)  ondansetron (ZOFRAN) tablet 4 mg (has no administration in time range)    Or   ondansetron (ZOFRAN) injection 4 mg (has no administration in time range)  heparin injection 5,000 Units (has no administration in time range)  polyethylene glycol-electrolytes (NuLYTELY) solution 4,000 mL (has no administration in time range)  iohexol (OMNIPAQUE) 300 MG/ML solution 100 mL (100 mLs Intravenous Contrast Given 09/08/22 0840)  sorbitol, milk of mag, mineral oil, glycerin (SMOG) enema (960 mLs Rectal Given 09/08/22 1036)  fentaNYL (SUBLIMAZE) injection 50 mcg (50 mcg Intravenous Given 09/08/22 0928)  ondansetron (ZOFRAN) injection 4 mg (4 mg Intravenous Given 09/08/22 0927)  polyethylene glycol (MIRALAX / GLYCOLAX) packet 17 g (17 g Oral Given 09/08/22 1149)  sodium chloride 0.9 % bolus 500 mL (500 mLs Intravenous New Bag/Given 09/08/22 1256)  lactulose ([place]) 10  GM/15ML solution 20 g (20 g Oral Given 09/08/22 1255)     Consults:  Hospitalist   EMR reviewed  Reviewed prior ED visits as well as prior hospitalization for constipation and intractable abdominal pain.     FINAL CLINICAL IMPRESSION(S) / ED DIAGNOSES   Final diagnoses:  Slow transit constipation  Redundant colon     Rx / DC Orders   ED Discharge Orders     None        Note:  This document was prepared using Dragon voice recognition software and may include unintentional dictation errors.   Duffy Bruce, MD 09/08/22 [phone]

## 2022-09-08 NOTE — ED Triage Notes (Signed)
Pt arrived via POV with husband reports LLQ abd pain since yesterday evening. Pt has hx of IBS with constipation, pt used an enema last night with minimal output. Pt has seen GI in the past but states they just tell her she is "full of poop" Pt has colonoscopy in July 2022.  Pt also concerned about rash under L breast that started a few days ago that is painful.

## 2022-09-08 NOTE — ED Notes (Signed)
Advised nurse that patient has ready bed 

## 2022-09-08 NOTE — Hospital Course (Signed)
Ms. Destiny Savage is a 71 year old female with history of hyperlipidemia, hypertension, non-insulin-dependent diabetes mellitus, memory disturbances, history of TIA/stroke, OSA not on CPAP, IBS with constipation, who presents to emergency department for chief concerns of severe constipation/abdominal pain.  Initial vitals in the emergency department showed temperature of 98.1, respiration rate 18, heart rate of 71, blood pressure 146/70, SPO2 98% on room air.  Serum sodium is 138, potassium 3.9, chloride 106, bicarb 25, BUN of 10, serum creatinine of 0.81, nonfasting blood glucose 142, EGFR greater than 60.  WBC 8.5, hemoglobin 11.3, platelets of 242.  UA was negative for nitrates and leukocytes.  Lipase was within normal limits.  CT abdomen and pelvis with contrast: Was read as retained stool in the large bowel, especially the redundant splenic flexure, descending and sigmoid colon segments.  No acute or inflammatory process identified.  Normal appendix.  ED treatment: Fentanyl 50 mcg, ondansetron 4 mg IV, sodium chloride 500 mL bolus, smog enema, lactulose 20 g, propylene glycol 17 g p.o. one-time dose.

## 2022-09-08 NOTE — Assessment & Plan Note (Signed)
-   Carvedilol 6.25 mg p.o. twice daily

## 2022-09-08 NOTE — H&P (Addendum)
History and Physical   AULANI SHIPTON ZHY:[id number] DOB: 1951-05-17 DOA: 09/08/2022  PCP: Albina Billet, MD  Outpatient Specialists: Dr. Rockey Situ, [facility] cardiology Patient coming from: Home via [place]  I have personally briefly reviewed patient's old medical records in [facility].  Chief Concern: Abdominal pain  HPI: Destiny Savage is a 71 year old female with history of hyperlipidemia, hypertension, non-insulin-dependent diabetes mellitus, memory disturbances, history of TIA/stroke, OSA not on CPAP, IBS with constipation, who presents to emergency department for chief concerns of severe constipation/abdominal pain.  Initial vitals in the emergency department showed temperature of 98.1, respiration rate 18, heart rate of 71, blood pressure 146/70, SPO2 98% on room air.  Serum sodium is 138, potassium 3.9, chloride 106, bicarb 25, BUN of 10, serum creatinine of 0.81, nonfasting blood glucose 142, EGFR greater than 60.  WBC 8.5, hemoglobin 11.3, platelets of 242.  UA was negative for nitrates and leukocytes.  Lipase was within normal limits.  CT abdomen and pelvis with contrast: Was read as retained stool in the large bowel, especially the redundant splenic flexure, descending and sigmoid colon segments.  No acute or inflammatory process identified.  Normal appendix.  ED treatment: Fentanyl 50 mcg, ondansetron 4 mg IV, sodium chloride 500 mL bolus, smog enema, lactulose 20 g, propylene glycol 17 g p.o. one-time dose. -------------------------- At bedside, Destiny Savage tells me her name, age, current calendar year, and Destiny Savage knows Destiny Savage is in the hospital.   Destiny Savage reports Destiny Savage has been having generalized abdominal discomfort. Destiny Savage does not remember the last time Destiny Savage had a bowel movement.  Destiny Savage reports this is similar to her prior episodes of severe constipation.  Destiny Savage has tried OTC stool softener and it only helped alittle bit. Destiny Savage was prescribed lactulose daily and this didn't really help.  He states Destiny Savage  had 1 small to moderate bowel movement after this but overall still has not had a good bowel movement.  Destiny Savage endorses nausea and denies vomiting.  Destiny Savage endorses loss of appetite.  Destiny Savage denies chest pain, shortness of breath, dysuria, hematuria, muscle aches.  Destiny Savage reports that Destiny Savage is even tried giving herself enemas.  Destiny Savage reports that Destiny Savage had some watery bowel movements after but Destiny Savage suspect this was the enema fluid.  Social history: Destiny Savage lives with her husband, 40+ years. Destiny Savage denies tobacco, etoh, recreational drug use. 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   ROS: Constitutional: no weight change, no fever ENT/Mouth: no sore throat, no rhinorrhea Eyes: no eye pain, no vision changes Cardiovascular: no chest pain, no dyspnea,  no edema, no palpitations Respiratory: no cough, no sputum, no wheezing Gastrointestinal: + nausea, no vomiting, no diarrhea, + constipation Genitourinary: no urinary incontinence, no dysuria, no hematuria Musculoskeletal: no arthralgias, no myalgias Skin: no skin lesions, no pruritus, Neuro: + weakness, no loss of consciousness, no syncope Psych: no anxiety, no depression, + decrease appetite Heme/Lymph: no bruising, no bleeding  ED Course: Discussed with emergency medicine provider, patient requiring hospitalization for chief concerns of obstipation/severe constipation.  Assessment/Plan  Principal Problem:   Obstipation Active Problems:   Hypertension   Diabetes type 2, controlled (HCC)   GERD (gastroesophageal reflux disease)   Mixed hyperlipidemia   Chronic constipation   Assessment and Plan:  * Obstipation - Patient is status post lactulose 20 g p.o. 1, MiraLAX one-time dose, smog enema - Status post sodium chloride 500 mL bolus - 1 dose of GoLytely ordered for intake on admission and monitor -  Strict I's and O's - If patient does not have a sufficient bowel movement, would recommend a.m. team to consider consultation  to general surgery versus GI and or reimaging to evaluate for obstruction - Symptomatic support: Ondansetron 4 mg p.o. and IV as needed for nausea, vomiting - Lactated Ringer 100 mL/h, 1 day ordered - N.p.o. except for sips of meds  Chronic constipation - Home Constulose, Colace 100 mg, twice daily have not been resumed on admission - GoLytely ordered  Mixed hyperlipidemia - Atorvastatin 40 mg daily resumed  GERD (gastroesophageal reflux disease) - PPI as needed for GERD or reflux  Hypertension - Carvedilol 6.25 mg p.o. twice daily  Chart reviewed.   DVT prophylaxis: Heparin 5000 units subcutaneous every 8 hours Code Status: Full code Diet: N.p.o. except for sips with meds and ice chips Family Communication: Patient's husband was at bedside and notes patient is being admitted to the hospital Disposition Plan: Pending clinical course Consults called: None at this time Admission status: MedSurg, observation  Past Medical History:  Diagnosis Date   Arthritis    Asthma    Diabetes mellitus    GERD (gastroesophageal reflux disease)    Hyperlipidemia    Hypertension    Mini stroke    Stroke [facility])    Past Surgical History:  Procedure Laterality Date   ABDOMINAL HYSTERECTOMY     BREAST EXCISIONAL BIOPSY Left 1980's   benign   CARDIAC CATHETERIZATION     CHOLECYSTECTOMY     Social History:  reports that Destiny Savage has never smoked. Destiny Savage has never used smokeless tobacco. Destiny Savage reports that Destiny Savage does not drink alcohol and does not use drugs.  Allergies  Allergen Reactions   Latex Hives and Rash    Other reaction(s): Other (See Comments)   Lidocaine Hives   Phenobarbital-Belladonna Alk     Other reaction(s): Hives & Rash   Phenobarbital-Belladonna Alk Hives and Rash   Family History  Problem Relation Age of Onset   Heart Problems Father    Hypertension Other    CAD Other    Breast cancer Neg Hx    Family history: Family history reviewed and not pertinent  Prior to  Admission medications   Medication Sig Start Date End Date Taking? Authorizing Provider  acetaminophen (TYLENOL) 500 MG tablet Take 500-1,000 mg by mouth every 8 (eight) hours as needed.   Yes [provider]  albuterol (VENTOLIN HFA) 108 (90 Base) MCG/ACT inhaler Inhale 1 puff into the lungs every 6 (six) hours as needed for wheezing.   Yes [provider]  aspirin 81 MG tablet Take 1 tablet (81 mg total) by mouth daily. 01/11/17  Yes Sudini, Alveta Heimlich, MD  atorvastatin (LIPITOR) 40 MG tablet Take 1 tablet (40 mg total) by mouth daily. 06/05/22  Yes Minna Merritts, MD  carvedilol (COREG) 6.25 MG tablet Take 1 tablet (6.25 mg total) by mouth 2 (two) times daily with a meal. 03/21/20  Yes Gollan, Kathlene November, MD  cholecalciferol (VITAMIN D3) 25 MCG (1000 UNIT) tablet Take 1,000 Units by mouth daily.   Yes [provider]  clopidogrel (PLAVIX) 75 MG tablet Take 1 tablet (75 mg total) by mouth daily. 03/21/20  Yes Gollan, Kathlene November, MD  CONSTULOSE 10 GM/15ML solution Take 10 g by mouth daily.   Yes [provider]  docusate sodium (COLACE) 100 MG capsule Take 1 capsule (100 mg total) by mouth 2 (two) times daily. 05/19/22 05/19/23 Yes Harvest Dark, MD  metFORMIN (GLUCOPHAGE-XR) 500 MG 24  hr tablet Take 1,000 mg by mouth daily. 11/23/19  Yes [provider]  Multiple Vitamin (MULTIVITAMIN WITH MINERALS) TABS tablet Take 1 tablet by mouth daily.   Yes [provider]  pantoprazole (PROTONIX) 40 MG tablet Take 40 mg by mouth daily. 04/18/22  Yes [provider]  polyethylene glycol (MIRALAX) 17 g packet Take 17 g by mouth daily. 04/06/21  Yes Nance Pear, MD  donepezil (ARICEPT) 5 MG tablet Take 1 tablet (5 mg total) by mouth at bedtime. Patient not taking: Reported on 09/08/2022 07/08/22   Pieter Partridge, DO  furosemide (LASIX) 20 MG tablet Take 1 tablet (20 mg total) by mouth every Monday, Wednesday, and Friday. 06/05/22   Minna Merritts, MD   meloxicam (MOBIC) 15 MG tablet Take 15 mg by mouth daily. Patient not taking: Reported on 09/08/2022    [provider]  nortriptyline (PAMELOR) 10 MG capsule Take 1 capsule (10 mg total) by mouth at bedtime. Patient not taking: Reported on 09/08/2022 07/08/22   Pieter Partridge, DO  ondansetron (ZOFRAN-ODT) 4 MG disintegrating tablet Take 1 tablet (4 mg total) by mouth every 8 (eight) hours as needed for nausea or vomiting. Patient not taking: Reported on 09/08/2022 04/09/22   Duffy Bruce, MD  potassium chloride (KLOR-CON) 10 MEQ tablet Take 1 tablet (10 mEq total) by mouth every Monday, Wednesday, and Friday. 06/05/22   Minna Merritts, MD   Physical Exam: Vitals:   09/08/22 1219 09/08/22 1541 09/08/22 1541 09/08/22 1726  BP:  (!) 146/76  (!) 170/80  Pulse: 61 65  60  Resp:  20  16  Temp:   97.7 F (36.5 C) 98 F (36.7 C)  TempSrc:   Oral Oral  SpO2: 99% 98%  100%  Weight:      Height:       Constitutional: appears age-appropriate, NAD, calm, comfortable Eyes: PERRL, lids and conjunctivae normal ENMT: Mucous membranes are moist. Posterior pharynx clear of any exudate or lesions. Age-appropriate dentition. Hearing appropriate Neck: normal, supple, no masses, no thyromegaly Respiratory: clear to auscultation bilaterally, no wheezing, no crackles. Normal respiratory effort. No accessory muscle use.  Cardiovascular: Regular rate and rhythm, no murmurs / rubs / gallops. No extremity edema. 2+ pedal pulses. No carotid bruits.  Abdomen: no tenderness, no masses palpated, no hepatosplenomegaly.  Decreased bowel sounds.  Musculoskeletal: no clubbing / cyanosis. No joint deformity upper and lower extremities. Good ROM, no contractures, no atrophy. Normal muscle tone.  Skin: no rashes, lesions, ulcers. No induration Neurologic: Sensation intact. Strength 5/5 in all 4.  Psychiatric: Normal judgment and insight. Alert and oriented x 3. Normal mood.   EKG: not indicated at this  time Imaging on Admission: I personally reviewed and I agree with radiologist reading as below.  CT ABDOMEN PELVIS W CONTRAST  Result Date: 09/08/2022 CLINICAL DATA:  71 year old female with left lower quadrant abdominal pain since yesterday evening. Enema usage last night with little improvement. EXAM: CT ABDOMEN AND PELVIS WITH CONTRAST TECHNIQUE: Multidetector CT imaging of the abdomen and pelvis was performed using the standard protocol following bolus administration of intravenous contrast. RADIATION DOSE REDUCTION: This exam was performed according to the departmental dose-optimization program which includes automated exposure control, adjustment of the mA and/or kV according to patient size and/or use of iterative reconstruction technique. CONTRAST:  153m OMNIPAQUE IOHEXOL 300 MG/ML  SOLN COMPARISON:  Noncontrast CT Abdomen and Pelvis 06/03/2022 and earlier. FINDINGS: Lower chest: No pericardial or pleural effusion. Stable minor lung  base atelectasis or scarring. Hepatobiliary: Chronically absent gallbladder. Stable and negative liver. Pancreas: Negative. Spleen: Negative. Adrenals/Urinary Tract: Normal adrenal glands. Kidneys appears stable and negative. Negative ureters. Diminutive and negative bladder. Stomach/Bowel: Redundant large bowel, especially the sigmoid colon an, descending colon, and d splenic flexure. Retained stool from the splenic flexure proximally, similar to July. No large bowel wall thickening or inflammation identified. Normal appendix on series 2, image 63. No significant large bowel diverticula. No dilated small bowel. Stomach and duodenum are within normal limits. No free air, free fluid, or mesenteric inflammation identified. Vascular/Lymphatic: Major arterial structures in the abdomen and pelvis are normal for age with very mild calcified atherosclerosis fairly limited to the left iliac and proximal femoral arteries. Portal venous system is patent. No lymphadenopathy.  Reproductive: Absent uterus.  Diminutive or absent ovaries. Other: No pelvic free fluid. Musculoskeletal: No acute osseous abnormality identified. Mild for age spine degeneration. More advanced chronic hip degeneration and greater on the right side. Chronic degenerative hip joint subchondral cysts. IMPRESSION: Similar retained stool in large bowel - especially the redundant splenic flexure, descending and sigmoid colon segments. No acute or inflammatory process identified. Normal appendix. Electronically Signed   By: Genevie Ann M.D.   On: 09/08/2022 09:01    Labs on Admission: I have personally reviewed following labs  CBC: Recent Labs  Lab 09/08/22 0629  WBC 8.5  HGB 11.3*  HCT 35.8*  MCV 94.0  PLT 244   Basic Metabolic Panel: Recent Labs  Lab 09/08/22 0629  NA 138  K 3.9  CL 106  CO2 25  GLUCOSE 142*  BUN 10  CREATININE 0.81  CALCIUM 9.3   GFR: Estimated Creatinine Clearance: 65.9 mL/min (by C-G formula based on SCr of 0.81 mg/dL).  Liver Function Tests: Recent Labs  Lab 09/08/22 0629  AST 23  ALT 13  ALKPHOS 64  BILITOT 0.3  PROT 6.6  ALBUMIN 3.8   Recent Labs  Lab 09/08/22 0629  LIPASE 28   Urine analysis:    Component Value Date/Time   COLORURINE YELLOW (A) 09/08/2022 0633   APPEARANCEUR CLEAR (A) 09/08/2022 0633   APPEARANCEUR Clear 02/19/2015 1647   LABSPEC 1.006 09/08/2022 0633   LABSPEC 1.025 02/19/2015 1647   PHURINE 5.0 09/08/2022 [place] 09/08/2022 0633   GLUCOSEU Negative 02/19/2015 1647   HGBUR NEGATIVE 09/08/2022 0633   BILIRUBINUR NEGATIVE 09/08/2022 0633   BILIRUBINUR Negative 02/19/2015 [place] 09/08/2022 0633   PROTEINUR NEGATIVE 09/08/2022 0633   NITRITE NEGATIVE 09/08/2022 0633   LEUKOCYTESUR NEGATIVE 09/08/2022 0633   LEUKOCYTESUR Trace 02/19/2015 1647   Dr. Tobie Poet [facility]  If 7PM-7AM, please contact overnight-coverage provider If 7AM-7PM, please contact day coverage  provider [email or web address]  09/08/2022, 6:13 PM

## 2022-09-08 NOTE — ED Notes (Signed)
Pt reports having a small bowel movement.  

## 2022-09-08 NOTE — Assessment & Plan Note (Signed)
-   Patient is status post lactulose 20 g p.o. 1, MiraLAX one-time dose, smog enema - Status post sodium chloride 500 mL bolus - 1 dose of GoLytely ordered for intake on admission and monitor - Strict I's and O's - If patient does not have a sufficient bowel movement, would recommend a.m. team to consider consultation to general surgery versus GI and or reimaging to evaluate for obstruction - Symptomatic support: Ondansetron 4 mg p.o. and IV as needed for nausea, vomiting - Lactated Ringer 100 mL/h, 1 day ordered - N.p.o. except for sips of meds

## 2022-09-08 NOTE — ED Notes (Signed)
Pt states having had multiple bowel movements since starting the [facility]

## 2022-09-08 NOTE — ED Notes (Signed)
As per EDP Dr. Ellender Hose, pt to get the rest of the SMOG enema. RN administered and received in total approximately 80% (1st and 2nd attempt).

## 2022-09-08 NOTE — Assessment & Plan Note (Signed)
-   PPI as needed for GERD or reflux

## 2022-09-08 NOTE — Assessment & Plan Note (Signed)
-   Home Constulose, Colace 100 mg, twice daily have not been resumed on admission - GoLytely ordered

## 2022-09-08 NOTE — ED Notes (Signed)
Report and hand off of care given to Whitney, RN 

## 2022-09-09 DIAGNOSIS — K5900 Constipation, unspecified: Secondary | ICD-10-CM | POA: Diagnosis not present

## 2022-09-09 DIAGNOSIS — K5909 Other constipation: Secondary | ICD-10-CM | POA: Diagnosis not present

## 2022-09-09 LAB — CBC
HCT: 33.9 % — ABNORMAL LOW (ref 36.0–46.0)
Hemoglobin: 10.8 g/dL — ABNORMAL LOW (ref 12.0–15.0)
MCH: 29.6 pg (ref 26.0–34.0)
MCHC: 31.9 g/dL (ref 30.0–36.0)
MCV: 92.9 fL (ref 80.0–100.0)
Platelets: 243 10*3/uL (ref 150–400)
RBC: 3.65 MIL/uL — ABNORMAL LOW (ref 3.87–5.11)
RDW: 13.3 % (ref 11.5–15.5)
WBC: 9.8 10*3/uL (ref 4.0–10.5)
nRBC: 0 % (ref 0.0–0.2)

## 2022-09-09 LAB — BASIC METABOLIC PANEL
Anion gap: 9 (ref 5–15)
BUN: 6 mg/dL — ABNORMAL LOW (ref 8–23)
CO2: 24 mmol/L (ref 22–32)
Calcium: 9.5 mg/dL (ref 8.9–10.3)
Chloride: 109 mmol/L (ref 98–111)
Creatinine, Ser: 0.82 mg/dL (ref 0.44–1.00)
GFR, Estimated: >60 mL/min (ref >60)
Glucose, Bld: 98 mg/dL (ref 70–99)
Potassium: 4 mmol/L (ref 3.5–5.1)
Sodium: 142 mmol/L (ref 135–145)

## 2022-09-09 MED ORDER — SENNOSIDES-DOCUSATE SODIUM 8.6-50 MG PO TABS: 1.0000 | ORAL_TABLET | Freq: Two times a day (BID) | ORAL | 0 refills | Status: DC | PRN

## 2022-09-09 MED ORDER — POLYETHYLENE GLYCOL 3350 17 G PO PACK: 17.0000 g | PACK | Freq: Every day | ORAL | 0 refills | Status: AC

## 2022-09-09 MED ORDER — LACTULOSE 20 GM/30ML PO SOLN: 20.0000 g | Freq: Every day | ORAL | 0 refills | Status: DC | PRN

## 2022-09-09 NOTE — Plan of Care (Signed)
  Problem: Clinical Measurements: Goal: Will remain free from infection Outcome: Progressing   Problem: Clinical Measurements: Goal: Diagnostic test results will improve Outcome: Progressing   Problem: Coping: Goal: Level of anxiety will decrease Outcome: Progressing   Problem: Elimination: Goal: Will not experience complications related to bowel motility Outcome: Progressing   Problem: Pain Managment: Goal: General experience of comfort will improve Outcome: Progressing   

## 2022-09-09 NOTE — Progress Notes (Signed)
Pt discharged per MD order.  IV removed. Discharge instructions reviewed with pt. Pt verbalized understanding. All questions answered to pt satisfaction. Pt taken out in wheelchair by staff.  

## 2022-09-09 NOTE — Discharge Summary (Signed)
Physician Discharge Summary   Patient: Destiny Savage MRN: [id number] DOB: 02-19-1951  Admit date:     09/08/2022  Discharge date: 09/09/22  Discharge Physician: Sharen Hones   PCP: Albina Billet, MD   Recommendations at discharge:   Follow-up with PCP in 1 week.  Discharge Diagnoses: Principal Problem:   Obstipation Active Problems:   Hypertension   Diabetes type 2, controlled ([place])   GERD (gastroesophageal reflux disease)   Mixed hyperlipidemia   Chronic constipation  Resolved Problems:   * No resolved hospital problems. [facility] Course: Ms. Destiny Savage is a 71 year old female with history of hyperlipidemia, hypertension, non-insulin-dependent diabetes mellitus, memory disturbances, history of TIA/stroke, OSA not on CPAP, IBS with constipation, who presents to emergency department for chief concerns of severe constipation/abdominal pain.  Patient was given multiple doses of stool softeners, had additional 5 loose stools, constipation has resolved.  Abdominal pain is better.  She is medically stable to be discharged.  Assessment and Plan: Obstipation with chronic constipation. Condition has improved, I have prescribed scheduled MiraLAX, as needed senna and as needed lactulose. Patient is also advised to eat plenty of fruits and vegetables, increase activity.  Follow-up with PCP as outpatient.  Condition resolved.       Consultants: None Procedures performed: None  Disposition: Home Diet recommendation:  Discharge Diet Orders (From admission, onward)     Start     Ordered   09/09/22 0000  Diet - low sodium heart healthy        09/09/22 0852           Cardiac diet DISCHARGE MEDICATION: Allergies as of 09/09/2022       Reactions   Latex Hives, Rash   Other reaction(s): Other (See Comments)   Lidocaine Hives   Phenobarbital-belladonna Alk    Other reaction(s): Hives & Rash   Phenobarbital-belladonna Alk Hives, Rash        Medication List      STOP taking these medications    docusate sodium 100 MG capsule Commonly known as: Colace   donepezil 5 MG tablet Commonly known as: ARICEPT   meloxicam 15 MG tablet Commonly known as: MOBIC   nortriptyline 10 MG capsule Commonly known as: PAMELOR   ondansetron 4 MG disintegrating tablet Commonly known as: ZOFRAN-ODT       TAKE these medications    acetaminophen 500 MG tablet Commonly known as: TYLENOL Take 500-1,000 mg by mouth every 8 (eight) hours as needed.   albuterol 108 (90 Base) MCG/ACT inhaler Commonly known as: VENTOLIN HFA Inhale 1 puff into the lungs every 6 (six) hours as needed for wheezing.   aspirin 81 MG tablet Take 1 tablet (81 mg total) by mouth daily.   atorvastatin 40 MG tablet Commonly known as: LIPITOR Take 1 tablet (40 mg total) by mouth daily.   carvedilol 6.25 MG tablet Commonly known as: COREG Take 1 tablet (6.25 mg total) by mouth 2 (two) times daily with a meal.   cholecalciferol 25 MCG (1000 UNIT) tablet Commonly known as: VITAMIN D3 Take 1,000 Units by mouth daily.   clopidogrel 75 MG tablet Commonly known as: PLAVIX Take 1 tablet (75 mg total) by mouth daily.   furosemide 20 MG tablet Commonly known as: LASIX Take 1 tablet (20 mg total) by mouth every Monday, Wednesday, and Friday.   Lactulose 20 GM/30ML Soln Take 30 mLs (20 g total) by mouth daily as needed (constipation). What changed:  medication strength how much to take  when to take this reasons to take this   metFORMIN 500 MG 24 hr tablet Commonly known as: GLUCOPHAGE-XR Take 1,000 mg by mouth daily.   multivitamin with minerals Tabs tablet Take 1 tablet by mouth daily.   pantoprazole 40 MG tablet Commonly known as: PROTONIX Take 40 mg by mouth daily.   polyethylene glycol 17 g packet Commonly known as: MiraLax Take 17 g by mouth daily.   potassium chloride 10 MEQ tablet Commonly known as: KLOR-CON Take 1 tablet (10 mEq total) by mouth every  Monday, Wednesday, and Friday.   senna-docusate 8.6-50 MG tablet Commonly known as: Senokot-S Take 1 tablet by mouth 2 (two) times daily as needed for mild constipation.        Follow-up Information     Albina Billet, MD Follow up in 1 week(s).   Specialty: Internal Medicine Contact information: [address]   [place] [address] [phone]                Discharge Exam: Danley Danker Weights   09/08/22 0626  Weight: 72.6 kg   General exam: Appears calm and comfortable  Respiratory system: Clear to auscultation. Respiratory effort normal. Cardiovascular system: S1 & S2 heard, RRR. No JVD, murmurs, rubs, gallops or clicks. No pedal edema. Gastrointestinal system: Abdomen is nondistended, soft and nontender. No organomegaly or masses felt. Normal bowel sounds heard. Central nervous system: Alert and oriented. No focal neurological deficits. Extremities: Symmetric 5 x 5 power. Skin: No rashes, lesions or ulcers Psychiatry: Judgement and insight appear normal. Mood & affect appropriate.    Condition at discharge: good  The results of significant diagnostics from this hospitalization (including imaging, microbiology, ancillary and laboratory) are listed below for reference.   Imaging Studies: CT ABDOMEN PELVIS W CONTRAST  Result Date: 09/08/2022 CLINICAL DATA:  71 year old female with left lower quadrant abdominal pain since yesterday evening. Enema usage last night with little improvement. EXAM: CT ABDOMEN AND PELVIS WITH CONTRAST TECHNIQUE: Multidetector CT imaging of the abdomen and pelvis was performed using the standard protocol following bolus administration of intravenous contrast. RADIATION DOSE REDUCTION: This exam was performed according to the departmental dose-optimization program which includes automated exposure control, adjustment of the mA and/or kV according to patient size and/or use of iterative reconstruction technique. CONTRAST:  180m OMNIPAQUE  IOHEXOL 300 MG/ML  SOLN COMPARISON:  Noncontrast CT Abdomen and Pelvis 06/03/2022 and earlier. FINDINGS: Lower chest: No pericardial or pleural effusion. Stable minor lung base atelectasis or scarring. Hepatobiliary: Chronically absent gallbladder. Stable and negative liver. Pancreas: Negative. Spleen: Negative. Adrenals/Urinary Tract: Normal adrenal glands. Kidneys appears stable and negative. Negative ureters. Diminutive and negative bladder. Stomach/Bowel: Redundant large bowel, especially the sigmoid colon an, descending colon, and d splenic flexure. Retained stool from the splenic flexure proximally, similar to July. No large bowel wall thickening or inflammation identified. Normal appendix on series 2, image 63. No significant large bowel diverticula. No dilated small bowel. Stomach and duodenum are within normal limits. No free air, free fluid, or mesenteric inflammation identified. Vascular/Lymphatic: Major arterial structures in the abdomen and pelvis are normal for age with very mild calcified atherosclerosis fairly limited to the left iliac and proximal femoral arteries. Portal venous system is patent. No lymphadenopathy. Reproductive: Absent uterus.  Diminutive or absent ovaries. Other: No pelvic free fluid. Musculoskeletal: No acute osseous abnormality identified. Mild for age spine degeneration. More advanced chronic hip degeneration and greater on the right side. Chronic degenerative hip joint subchondral cysts. IMPRESSION: Similar  retained stool in large bowel - especially the redundant splenic flexure, descending and sigmoid colon segments. No acute or inflammatory process identified. Normal appendix. Electronically Signed   By: Genevie Ann M.D.   On: 09/08/2022 09:01    Microbiology: No results found for this or any previous visit.  Labs: CBC: Recent Labs  Lab 09/08/22 0629 09/09/22 0401  WBC 8.5 9.8  HGB 11.3* 10.8*  HCT 35.8* 33.9*  MCV 94.0 92.9  PLT 242 854   Basic Metabolic  Panel: Recent Labs  Lab 09/08/22 0629 09/09/22 0401  NA 138 142  K 3.9 4.0  CL 106 109  CO2 25 24  GLUCOSE 142* 98  BUN 10 6*  CREATININE 0.81 0.82  CALCIUM 9.3 9.5   Liver Function Tests: Recent Labs  Lab 09/08/22 0629  AST 23  ALT 13  ALKPHOS 64  BILITOT 0.3  PROT 6.6  ALBUMIN 3.8   CBG: No results for input(s): "GLUCAP" in the last 168 hours.  Discharge time spent: less than 30 minutes.  Signed: Sharen Hones, MD [facility] 09/09/2022

## 2022-09-09 NOTE — TOC Initial Note (Signed)
Transition of Care [facility]) - Initial/Assessment Note    Patient Details  Name: Destiny Savage MRN: [id number] Date of Birth: February 05, 1951  Transition of Care [facility]) CM/SW Contact:    Beverly Sessions, RN Phone Number: 09/09/2022, 9:13 AM  Clinical Narrative:                       Transition of Care (TOC) Screening Note   Patient Details  Name: Destiny Savage Date of Birth: Mar 19, 1951   Transition of Care [facility]) CM/SW Contact:    Beverly Sessions, RN Phone Number: 09/09/2022, 9:13 AM    Transition of Care Department [facility]) has reviewed patient and no TOC needs have been identified at this time. We will continue to monitor patient advancement through interdisciplinary progression rounds. If new patient transition needs arise, please place a TOC consult.     Patient Goals and CMS Choice        Expected Discharge Plan and Services           Expected Discharge Date: 09/09/22                                    Prior Living Arrangements/Services                       Activities of Daily Living Home Assistive Devices/Equipment: Kasandra Knudsen (specify quad or straight) ADL Screening (condition at time of admission) Patient's cognitive ability adequate to safely complete daily activities?: Yes Is the patient deaf or have difficulty hearing?: No Does the patient have difficulty seeing, even when wearing glasses/contacts?: No Does the patient have difficulty concentrating, remembering, or making decisions?: Yes Patient able to express need for assistance with ADLs?: Yes Does the patient have difficulty dressing or bathing?: No Independently performs ADLs?: Yes (appropriate for developmental age) Does the patient have difficulty walking or climbing stairs?: Yes Weakness of Legs: None Weakness of Arms/Hands: None  Permission Sought/Granted                  Emotional Assessment              Admission diagnosis:  Slow transit constipation  [K59.01] Obstipation [K59.00] Redundant colon [Q43.8] Patient Active Problem List   Diagnosis Date Noted   Obstipation 09/08/2022   AKI (acute kidney injury) ([place]) 05/27/2022   Elevated lipase 05/27/2022   Chronic constipation 05/27/2022   Elevated lactic acid level 05/27/2022   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 ([address]), [place] [address] [place]) [place] [address] Phone: [phone] Fax: [phone]     Social Determinants of Health (SDOH) Interventions    Readmission Risk Interventions     No data to display

## 2022-09-30 ENCOUNTER — Other Ambulatory Visit: Payer: Self-pay | Admitting: Cardiovascular Disease

## 2022-12-14 ENCOUNTER — Emergency Department
Admission: EM | Admit: 2022-12-14 | Discharge: 2022-12-14 | Disposition: A | Discharge status: Home / Self Care | Payer: Medicare Other | Attending: Emergency Medicine | Admitting: Emergency Medicine

## 2022-12-14 ENCOUNTER — Other Ambulatory Visit: Payer: Self-pay

## 2022-12-14 DIAGNOSIS — Z7984 Long term (current) use of oral hypoglycemic drugs: Secondary | ICD-10-CM | POA: Diagnosis not present

## 2022-12-14 DIAGNOSIS — Z7902 Long term (current) use of antithrombotics/antiplatelets: Secondary | ICD-10-CM | POA: Diagnosis not present

## 2022-12-14 DIAGNOSIS — G309 Alzheimer's disease, unspecified: Secondary | ICD-10-CM | POA: Insufficient documentation

## 2022-12-14 DIAGNOSIS — J45909 Unspecified asthma, uncomplicated: Secondary | ICD-10-CM | POA: Diagnosis not present

## 2022-12-14 DIAGNOSIS — E119 Type 2 diabetes mellitus without complications: Secondary | ICD-10-CM | POA: Insufficient documentation

## 2022-12-14 DIAGNOSIS — I10 Essential (primary) hypertension: Secondary | ICD-10-CM | POA: Insufficient documentation

## 2022-12-14 DIAGNOSIS — Z7982 Long term (current) use of aspirin: Secondary | ICD-10-CM | POA: Diagnosis not present

## 2022-12-14 DIAGNOSIS — Z79899 Other long term (current) drug therapy: Secondary | ICD-10-CM | POA: Diagnosis not present

## 2022-12-14 DIAGNOSIS — R519 Headache, unspecified: Secondary | ICD-10-CM | POA: Diagnosis present

## 2022-12-14 DIAGNOSIS — Z91040 Latex allergy status: Secondary | ICD-10-CM | POA: Insufficient documentation

## 2022-12-14 DIAGNOSIS — U071 COVID-19: Secondary | ICD-10-CM | POA: Insufficient documentation

## 2022-12-14 LAB — RESP PANEL BY RT-PCR (RSV, FLU A&B, COVID)  RVPGX2
Influenza A by PCR: NEGATIVE
Influenza B by PCR: NEGATIVE
Resp Syncytial Virus by PCR: NEGATIVE
SARS Coronavirus 2 by RT PCR: POSITIVE — AB

## 2022-12-14 MED ORDER — MOLNUPIRAVIR EUA 200MG CAPSULE: 4.0000 | ORAL_CAPSULE | Freq: Two times a day (BID) | ORAL | 0 refills | Status: AC

## 2022-12-14 NOTE — ED Triage Notes (Signed)
Pt to ED from home for headache. Pt advised she hasn't had much of an appetite either. Pt is CAOx4 and in no acute distress and ambulatory in triage. She denies any N/V/D.

## 2022-12-14 NOTE — Discharge Instructions (Signed)
You have been seen today in the emergency room and diagnosed with COVID.  We have discussed the use of antivirals to treat this.  I have sent in the prescription to the pharmacy requested.  If your symptoms persist or get worse and you should follow-up with your primary care provider or return to the emergency room.  Also be sure that you follow CDC guidelines for quarantine.

## 2022-12-14 NOTE — ED Notes (Signed)
Discharge instructions explained to patient and family at this time. Patient and family state they understand and agree.   

## 2022-12-14 NOTE — ED Provider Notes (Signed)
[facility] Emergency Department Provider Note   ____________________________________________   Event Date/Time   First MD Initiated Contact with Patient 12/14/22 1653     (approximate)  I have reviewed the triage vital signs and the nursing notes.   HISTORY  Chief Complaint Headache    HPI Destiny Savage is a 72 y.o. female patient presents to the emergency room with her husband today.  She has diagnosis of Alzheimer's.  Patient and spouse are historians for today's visit. Patient reports that for the past 48 hours she has had a headache.  She reports that the headache has gotten worse over the last 24 hours.  She has taken Tylenol/ibuprofen for the pain with minimal relief. She reports that she has also had some fatigue as well as decreased appetite. Patient denies cough, runny nose, chest pain, shortness of breath, fever, body aches, nausea/vomiting/diarrhea.   Past Medical History:  Diagnosis Date   Arthritis    Asthma    Diabetes mellitus    GERD (gastroesophageal reflux disease)    Hyperlipidemia    Hypertension    Mini stroke    Stroke [facility])     Patient Active Problem List   Diagnosis Date Noted   Obstipation 09/08/2022   AKI (acute kidney injury) ([facility]) 05/27/2022   Elevated lipase 05/27/2022   Chronic constipation 05/27/2022   Elevated lactic acid level 05/27/2022   Stroke (cerebrum) ([facility]) 01/07/2019   Mononeuritis multiplex, diabetic ([facility]) 01/11/2017   Stroke ([facility]) 01/10/2017   Mixed hyperlipidemia 11/21/2016   Neck pain 11/21/2016   Chest pain 12/23/2015   GERD (gastroesophageal reflux disease) 12/23/2015   Abdominal pain, acute on chronic 04/30/2013   Vertigo 05/11/2012   Hypertension 05/11/2012   Diabetes type 2, controlled ([facility]) 05/11/2012   Neck pain on right side 05/11/2012    Past Surgical History:  Procedure Laterality Date   ABDOMINAL HYSTERECTOMY     BREAST EXCISIONAL BIOPSY Left 1980's   benign   CARDIAC  CATHETERIZATION     CHOLECYSTECTOMY      Prior to Admission medications   Medication Sig Start Date End Date Taking? Authorizing Provider  molnupiravir EUA (LAGEVRIO) 200 mg CAPS capsule Take 4 capsules (800 mg total) by mouth 2 (two) times daily for 5 days. 12/14/22 12/19/22 Yes Herschell Dimes, NP  acetaminophen (TYLENOL) 500 MG tablet Take 500-1,000 mg by mouth every 8 (eight) hours as needed.    [provider]  albuterol (VENTOLIN HFA) 108 (90 Base) MCG/ACT inhaler Inhale 1 puff into the lungs every 6 (six) hours as needed for wheezing.    [provider]  aspirin 81 MG tablet Take 1 tablet (81 mg total) by mouth daily. 01/11/17   Milagros Loll, MD  atorvastatin (LIPITOR) 40 MG tablet Take 1 tablet (40 mg total) by mouth daily. 06/05/22   Antonieta Iba, MD  carvedilol (COREG) 6.25 MG tablet Take 1 tablet (6.25 mg total) by mouth 2 (two) times daily with a meal. 03/21/20   Gollan, Tollie Pizza, MD  cholecalciferol (VITAMIN D3) 25 MCG (1000 UNIT) tablet Take 1,000 Units by mouth daily.    [provider]  clopidogrel (PLAVIX) 75 MG tablet Take 1 tablet (75 mg total) by mouth daily. 03/21/20   Antonieta Iba, MD  furosemide (LASIX) 20 MG tablet Take 1 tablet (20 mg total) by mouth every Monday, Wednesday, and Friday. 06/05/22   Antonieta Iba, MD  Lactulose 20 GM/30ML SOLN Take 30 mLs (20 g total)  by mouth daily as needed (constipation). 09/09/22   Sharen Hones, MD  metFORMIN (GLUCOPHAGE-XR) 500 MG 24 hr tablet Take 1,000 mg by mouth daily. 11/23/19   [provider]  Multiple Vitamin (MULTIVITAMIN WITH MINERALS) TABS tablet Take 1 tablet by mouth daily.    [provider]  pantoprazole (PROTONIX) 40 MG tablet Take 40 mg by mouth daily. 04/18/22   [provider]  polyethylene glycol (MIRALAX) 17 g packet Take 17 g by mouth daily. 09/09/22   Sharen Hones, MD  potassium chloride (KLOR-CON) 10 MEQ tablet Take 1 tablet (10 mEq total) by mouth every  Monday, Wednesday, and Friday. 06/05/22   Minna Merritts, MD  senna-docusate (SENOKOT-S) 8.6-50 MG tablet Take 1 tablet by mouth 2 (two) times daily as needed for mild constipation. 09/09/22   Sharen Hones, MD    Allergies Latex, Lidocaine, Phenobarbital-belladonna alk, and Phenobarbital-belladonna alk  Family History  Problem Relation Age of Onset   Heart Problems Father    Hypertension Other    CAD Other    Breast cancer Neg Hx     Social History Social History   Tobacco Use   Smoking status: Never   Smokeless tobacco: Never  Vaping Use   Vaping Use: Never used  Substance Use Topics   Alcohol use: No   Drug use: No    Review of Systems  Constitutional: No fever/chills.  For decreased appetite Eyes: No visual changes. ENT: No sore throat. Cardiovascular: Denies chest pain. Respiratory: Denies shortness of breath. Gastrointestinal: No abdominal pain.  No nausea, no vomiting.  No diarrhea.  No constipation. Genitourinary: Negative for dysuria. Musculoskeletal: Negative for back pain. Skin: Negative for rash. Neurological: Negative focal weakness or numbness.  Positive for headache   ____________________________________________   PHYSICAL EXAM:  VITAL SIGNS: ED Triage Vitals  Enc Vitals Group     BP 12/14/22 1558 (!) 167/87     Pulse Rate 12/14/22 1558 85     Resp 12/14/22 1558 16     Temp 12/14/22 1558 99.4 F (37.4 C)     Temp Source 12/14/22 1558 Oral     SpO2 12/14/22 1558 98 %     Weight 12/14/22 1559 180 lb (81.6 kg)     Height 12/14/22 1559 5\' 6"  (1.676 m)     Head Circumference --      Peak Flow --      Pain Score 12/14/22 1559 10     Pain Loc --      Pain Edu? --      Excl. in [place]? --     Constitutional: Alert and oriented at her baseline.  Well appearing and in no acute distress. Eyes: Conjunctivae are normal. PERRL. EOMI. Head: Atraumatic. Nose: No congestion/rhinnorhea. Mouth/Throat: Mucous membranes are moist.  Oropharynx  non-erythematous. Neck: No stridor.   Cardiovascular: Normal rate, regular rhythm. Grossly normal heart sounds.  Good peripheral circulation. Respiratory: Normal respiratory effort.  No retractions. Lungs CTAB. Gastrointestinal: Soft and nontender. No distention. No abdominal bruits. No CVA tenderness. Musculoskeletal: No lower extremity tenderness nor edema.  No joint effusions. Neurologic:  Normal speech and language. No gross focal neurologic deficits are appreciated.  Patient has altered gait at baseline. Skin:  Skin is warm, dry and intact. No rash noted. Psychiatric: Mood and affect are normal. Speech and behavior are normal.  ____________________________________________   LABS (all labs ordered are listed, but only abnormal results are displayed)  Labs Reviewed  RESP PANEL BY RT-PCR (RSV, FLU A&B,  COVID)  [id number] - Abnormal; Notable for the following components:      Result Value   SARS Coronavirus 2 by RT PCR POSITIVE (*)    All other components within normal limits   ____________________________________________  EKG   ____________________________________________  RADIOLOGY  ED MD interpretation:    Official radiology report(s): No results found.  ____________________________________________   PROCEDURES  Procedure(s) performed: None  Procedures  Critical Care performed: No  ____________________________________________   INITIAL IMPRESSION / ASSESSMENT AND PLAN / ED COURSE     Boyd Litaker Kissel is a 72 y.o. female patient presents to the emergency room with her husband today.  She has diagnosis of Alzheimer's.  Patient and spouse are historians for today's visit. Patient reports that for the past 48 hours she has had a headache.  She reports that the headache has gotten worse over the last 24 hours.  She has taken Tylenol/ibuprofen for the pain with minimal relief. She reports that she has also had some fatigue as well as decreased appetite. Patient denies  cough, runny nose, chest pain, shortness of breath, fever, body aches, nausea/vomiting/diarrhea.   Respiratory panel was obtained.  Patient is positive for COVID. Patient has comorbidities to include asthma, diabetes, hyperlipidemia, hypertension, history of stroke. I discussed with patient and her spouse that she is COVID-positive.  We have discussed the use of antivirals for treatment.  They are both in agreement to start this medication.  They have been educated on the side effects as well as the benefits. Meds have been checked against the [facility] med calculator. Will start her on Molnupiravir I discussed with them quarantine precautions. If patient symptoms persist or worsen she should be seen by her primary care provider or return to the emergency room.  Patient we discharged home in stable condition at this time.      ____________________________________________   FINAL CLINICAL IMPRESSION(S) / ED DIAGNOSES  Final diagnoses:  [place]     ED Discharge Orders          Ordered    molnupiravir EUA (LAGEVRIO) 200 mg CAPS capsule  2 times daily        12/14/22 1711             Note:  This document was prepared using Dragon voice recognition software and may include unintentional dictation errors.     Willaim Rayas, NP 12/14/22 1718    Carrie Mew, MD 12/15/22 [phone]
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
## 2023-01-08 ENCOUNTER — Encounter: Payer: Self-pay | Admitting: Neurology

## 2023-01-08 ENCOUNTER — Ambulatory Visit: Payer: Medicare Other | Admitting: Neurology
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
## 2023-01-14 ENCOUNTER — Encounter: Payer: Self-pay | Admitting: Neurology

## 2023-01-14 ENCOUNTER — Ambulatory Visit: Payer: Medicare Other | Admitting: Neurology

## 2023-01-14 VITALS — BP 158/99 | HR 83 | Ht 66.0 in | Wt 201.2 lb

## 2023-01-14 DIAGNOSIS — G3184 Mild cognitive impairment, so stated: Secondary | ICD-10-CM

## 2023-01-14 DIAGNOSIS — G4733 Obstructive sleep apnea (adult) (pediatric): Secondary | ICD-10-CM | POA: Diagnosis not present

## 2023-01-14 DIAGNOSIS — G43009 Migraine without aura, not intractable, without status migrainosus: Secondary | ICD-10-CM

## 2023-01-14 NOTE — Patient Instructions (Signed)
Won't start new medications.  Will monitor for now.

## 2023-01-25 ENCOUNTER — Emergency Department: Payer: Medicare Other

## 2023-01-25 ENCOUNTER — Emergency Department
Admission: EM | Admit: 2023-01-25 | Discharge: 2023-01-25 | Disposition: A | Discharge status: Home / Self Care | Payer: Medicare Other | Attending: Emergency Medicine | Admitting: Emergency Medicine

## 2023-01-25 ENCOUNTER — Other Ambulatory Visit: Payer: Self-pay

## 2023-01-25 DIAGNOSIS — I10 Essential (primary) hypertension: Secondary | ICD-10-CM | POA: Insufficient documentation

## 2023-01-25 DIAGNOSIS — M545 Low back pain, unspecified: Secondary | ICD-10-CM | POA: Diagnosis present

## 2023-01-25 DIAGNOSIS — E119 Type 2 diabetes mellitus without complications: Secondary | ICD-10-CM | POA: Diagnosis not present

## 2023-01-25 DIAGNOSIS — M5441 Lumbago with sciatica, right side: Secondary | ICD-10-CM

## 2023-01-25 MED ORDER — ONDANSETRON 4 MG PO TBDP
4.0000 mg | ORAL_TABLET | Freq: Once | ORAL | Status: AC
  Administered 2023-01-25: 4 mg via ORAL
  Filled 2023-01-25: qty 1

## 2023-01-25 MED ORDER — OXYCODONE-ACETAMINOPHEN 5-325 MG PO TABS: 1.0000 | ORAL_TABLET | ORAL | 0 refills | Status: DC | PRN

## 2023-01-25 MED ORDER — OXYCODONE-ACETAMINOPHEN 5-325 MG PO TABS
2.0000 | ORAL_TABLET | Freq: Once | ORAL | Status: AC
  Administered 2023-01-25: 2 via ORAL
  Filled 2023-01-25: qty 2

## 2023-01-25 NOTE — ED Triage Notes (Signed)
C/O lower back pain.  Denies injury.  Pain across lower back.  Pain onset yesterday morning.  Denies dysuria.

## 2023-01-25 NOTE — ED Provider Notes (Signed)
[facility] Provider Note    Event Date/Time   First MD Initiated Contact with Patient 01/25/23 1351     (approximate)  History   Chief Complaint: Back Pain  HPI  Destiny Savage is a 72 y.o. female with a past medical history arthritis, diabetes, gastric reflux, hypertension, hyperlipidemia, presents emergency department for back pain.  According to the patient for the last 4 to 5 days she has been experiencing back pain that she states is progressively worsening.  The pain is much worse with any attempted movement such as sitting up or bending twisting, etc.  Patient states she has had back pain before but it has not been this bad previously.  Patient denies any weakness or numbness but does state occasional pain shooting down the right leg.  Denies any fever falls or trauma.  Physical Exam   Triage Vital Signs: ED Triage Vitals  Enc Vitals Group     BP 01/25/23 1334 (!) 148/76     Pulse Rate 01/25/23 1334 73     Resp 01/25/23 1334 16     Temp 01/25/23 1334 98.5 F (36.9 C)     Temp Source 01/25/23 1334 Oral     SpO2 01/25/23 1334 97 %     Weight 01/25/23 1333 200 lb 9.9 oz (91 kg)     Height 01/25/23 1333 '5\' 6"'$  (1.676 m)     Head Circumference --      Peak Flow --      Pain Score 01/25/23 1333 10     Pain Loc --      Pain Edu? --      Excl. in [place]? --     Most recent vital signs: Vitals:   01/25/23 1334  BP: (!) 148/76  Pulse: 73  Resp: 16  Temp: 98.5 F (36.9 C)  SpO2: 97%    General: Awake, no distress.  Apparent discomfort when moving in the lower back. CV:  Good peripheral perfusion.  Regular rate and rhythm  Resp:  Normal effort.  Equal breath sounds bilaterally.  Abd:  No distention.  Soft, nontender.  No rebound or guarding. Other:  Moderate paraspinal tenderness to palpation bilaterally in the lumbar spine but mostly of the right side.   ED Results / Procedures / Treatments   RADIOLOGY  I have reviewed and interpreted the  thoracic x-rays I do not see any obvious compression fracture on my evaluation. Radiology is read the x-rays of the lumbar spine and thoracic spine is negative for acute issue.    MEDICATIONS ORDERED IN ED: Medications  oxyCODONE-acetaminophen (PERCOCET/ROXICET) 5-325 MG per tablet 2 tablet (2 tablets Oral Given 01/25/23 1428)  ondansetron (ZOFRAN-ODT) disintegrating tablet 4 mg (4 mg Oral Given 01/25/23 1428)     IMPRESSION / MDM / ASSESSMENT AND PLAN / ED COURSE  I reviewed the triage vital signs and the nursing notes.  Patient's presentation is most consistent with acute presentation with potential threat to life or bodily function.  Patient presents emergency department for lower back pain mostly to the right side, worse with any movement especially bending or twisting.  Denies any falls or trauma.  No weakness or numbness, no fever.  Symptoms are very suggestive of musculoskeletal pain.  However as the patient states she has not had pain this bad before with her back we will obtain x-ray images to rule out any concerning findings such as a new compression fracture.  We will treat the patient's pain with  Percocet which she states she has taken before with good effect.  Will continue to closely monitor while awaiting results.  Patient agreeable to plan.  X-rays are negative for acute issue. Patient is feeling better after pain medication.  Will discharge with pain medication have the patient follow-up with her doctor.  Patient agreeable to plan of care.  Discussed with the patient the importance of following up with her doctor for further evaluation if the pain fails to resolve or worsens.  FINAL CLINICAL IMPRESSION(S) / ED DIAGNOSES   Lower back pain   Note:  This document was prepared using Dragon voice recognition software and may include unintentional dictation errors.   Harvest Dark, MD 01/25/23 1517

## 2023-01-29 ENCOUNTER — Other Ambulatory Visit: Payer: Self-pay | Admitting: Physician Assistant

## 2023-01-29 DIAGNOSIS — M5416 Radiculopathy, lumbar region: Secondary | ICD-10-CM

## 2023-01-31 ENCOUNTER — Ambulatory Visit
Admission: RE | Admit: 2023-01-31 | Discharge: 2023-01-31 | Disposition: A | Discharge status: Home / Self Care | Payer: Medicare Other | Source: Ambulatory Visit | Attending: Physician Assistant | Admitting: Physician Assistant

## 2023-01-31 DIAGNOSIS — M5416 Radiculopathy, lumbar region: Secondary | ICD-10-CM | POA: Diagnosis present

## 2023-02-04 ENCOUNTER — Encounter: Payer: Self-pay | Admitting: *Deleted

## 2023-02-04 ENCOUNTER — Other Ambulatory Visit: Payer: Self-pay

## 2023-02-04 ENCOUNTER — Emergency Department
Admission: EM | Admit: 2023-02-04 | Discharge: 2023-02-05 | Disposition: A | Discharge status: Home / Self Care | Payer: Medicare Other | Attending: Emergency Medicine | Admitting: Emergency Medicine

## 2023-02-04 ENCOUNTER — Emergency Department: Payer: Medicare Other

## 2023-02-04 DIAGNOSIS — M5126 Other intervertebral disc displacement, lumbar region: Secondary | ICD-10-CM | POA: Diagnosis not present

## 2023-02-04 DIAGNOSIS — W07XXXA Fall from chair, initial encounter: Secondary | ICD-10-CM | POA: Diagnosis not present

## 2023-02-04 DIAGNOSIS — M5431 Sciatica, right side: Secondary | ICD-10-CM | POA: Diagnosis not present

## 2023-02-04 DIAGNOSIS — M5136 Other intervertebral disc degeneration, lumbar region: Secondary | ICD-10-CM

## 2023-02-04 DIAGNOSIS — W19XXXA Unspecified fall, initial encounter: Secondary | ICD-10-CM

## 2023-02-04 DIAGNOSIS — M545 Low back pain, unspecified: Secondary | ICD-10-CM | POA: Diagnosis present

## 2023-02-04 MED ORDER — MELOXICAM 7.5 MG PO TABS: 7.5000 mg | ORAL_TABLET | Freq: Every day | ORAL | 0 refills | Status: DC

## 2023-02-04 MED ORDER — PREDNISONE 20 MG PO TABS
60.0000 mg | ORAL_TABLET | Freq: Once | ORAL | Status: AC
  Administered 2023-02-05: 60 mg via ORAL
  Filled 2023-02-04: qty 3

## 2023-02-04 MED ORDER — MELOXICAM 7.5 MG PO TABS
7.5000 mg | ORAL_TABLET | Freq: Once | ORAL | Status: AC
  Administered 2023-02-05: 7.5 mg via ORAL
  Filled 2023-02-04: qty 1

## 2023-02-04 MED ORDER — PREDNISONE 50 MG PO TABS: 50.0000 mg | ORAL_TABLET | Freq: Every day | ORAL | 0 refills | Status: DC

## 2023-02-04 NOTE — ED Provider Notes (Signed)
[facility] Provider Note  Patient Contact: 10:17 PM (approximate)   History   Back Pain   HPI  Destiny Savage is a 72 y.o. female who presents emergency department after a mechanical fall.  Patient states that she has been dealing with some sciatica, recently had an MRI of her lower back which she does not know the results of.  She states that she was going to sit in a chair, sciatica cause her pain and she missed the chair landing on her buttocks.  Did not hit her head or lose consciousness.  Denies any hip pain, lower extremity pain.  Denies any concerning neurosymptoms of bowel or bladder dysfunction, saddle anesthesia or paresthesias.     Physical Exam   Triage Vital Signs: ED Triage Vitals  Enc Vitals Group     BP 02/04/23 2100 (!) 182/86     Pulse Rate 02/04/23 2100 69     Resp 02/04/23 2100 20     Temp 02/04/23 2100 98.3 F (36.8 C)     Temp Source 02/04/23 2100 Oral     SpO2 02/04/23 2100 99 %     Weight 02/04/23 2056 200 lb 9.9 oz (91 kg)     Height 02/04/23 2056 5\' 6"  (1.676 m)     Head Circumference --      Peak Flow --      Pain Score 02/04/23 2056 7     Pain Loc --      Pain Edu? --      Excl. in [place]? --     Most recent vital signs: Vitals:   02/04/23 2209 02/04/23 2217  BP:  (!) 179/71  Pulse: 70 66  Resp: 19   Temp:    SpO2: 99% 100%     General: Alert and in no acute distress.  Cardiovascular:  Good peripheral perfusion Respiratory: Normal respiratory effort without tachypnea or retractions. Lungs CTAB.  Gastrointestinal: Bowel sounds 4 quadrants. Soft and nontender to palpation. No guarding or rigidity. No palpable masses. No distention. No CVA tenderness. Musculoskeletal: Full range of motion to all extremities.  Visualization of the lower back reveals no visible signs of trauma.  Mild diffuse lower lumbar spine tenderness without palpable abnormality.  No extension into the bilateral hips.  Pulses sensation intact and  equal bilateral lower extremities. Neurologic:  No gross focal neurologic deficits are appreciated.  Skin:   No rash noted Other:   ED Results / Procedures / Treatments   Labs (all labs ordered are listed, but only abnormal results are displayed) Labs Reviewed - No data to display   EKG     RADIOLOGY  I personally viewed, evaluated, and interpreted these images as part of my medical decision making, as well as reviewing the written report by the radiologist.  ED Provider Interpretation: No acute traumatic findings on x-ray of the spine.  Degenerative changes consistent with previous MRI from 4 days ago.  DG Lumbar Spine 2-3 Views  Result Date: 02/04/2023 CLINICAL DATA:  Fall, back pain EXAM: LUMBAR SPINE - 2-3 VIEW COMPARISON:  01/25/2023 FINDINGS: Normal lumbar lordosis. No acute fracture or listhesis of the lumbar spine. Vertebral body height is preserved. Minimal disc space narrowing at L3-4 and L4-5 is in keeping with minimal degenerative disc disease at these levels, unchanged. Paraspinal soft tissues are unremarkable. IMPRESSION: 1. No acute fracture or listhesis. Minimal degenerative disc disease. Electronically Signed   By: Fidela Salisbury M.D.   On: 02/04/2023 23:36  PROCEDURES:  Critical Care performed: No  Procedures   MEDICATIONS ORDERED IN ED: Medications  predniSONE (DELTASONE) tablet 60 mg (has no administration in time range)  meloxicam (MOBIC) tablet 7.5 mg (has no administration in time range)     IMPRESSION / MDM / ASSESSMENT AND PLAN / ED COURSE  I reviewed the triage vital signs and the nursing notes.                                 Differential diagnosis includes, but is not limited to, fall, herniated disc, bulging disc, compression fracture  Patient's presentation is most consistent with acute presentation with potential threat to life or bodily function.   Patient's diagnosis is consistent with fall, bulging lumbar disc, sciatica.   Patient presents emergency department after she missed the chair she went to sit down.  She is been dealing with some sciatica and has seen orthopedics and just completed an MRI.  MRI shows bulging disc.  No central cord compression.  Imaging today to ensure patient did not suffer from a compression fracture.  Imaging is reassuring in that regards.  At this time she has increased symptoms after falling over sciatica but no concerning neuro deficits warranting further investigation with MRI.  Short course of steroid and low-dose anti-inflammatory for the patient.  Follow-up with orthopedics as needed.  Return precautions discussed with the patient and her husband..  Patient is given ED precautions to return to the ED for any worsening or new symptoms.     FINAL CLINICAL IMPRESSION(S) / ED DIAGNOSES   Final diagnoses:  Bulging lumbar disc  Sciatica of right side  Fall, initial encounter     Rx / DC Orders   ED Discharge Orders          Ordered    predniSONE (DELTASONE) 50 MG tablet  Daily with breakfast        02/04/23 2354    meloxicam (MOBIC) 7.5 MG tablet  Daily        02/04/23 2354             Note:  This document was prepared using Dragon voice recognition software and may include unintentional dictation errors.   Brynda Peon 02/04/23 2356    Delman Kitten, MD 02/10/23 [phone]

## 2023-02-04 NOTE — ED Triage Notes (Addendum)
Pt brought in via ems from home.   Pt states she went to sit down in a chair, and fell because of pain in buttock.  Pt fell onto a chair.  Pt has pain in right buttock and back   dx with sciatica last week.  Pt taking meds without relief.  Pt alert.   Pt has mri last week and no one called pt with results.

## 2023-02-04 NOTE — ED Notes (Signed)
See triage note. Student PA/NP talking with pt; family remains at bedside; placed pt on monitor; pt denies hitting head, pt's speech clear, resp reg/unlabored, skin dry, sitting calmly on stretcher, pt relaying same info noted during triage.

## 2023-02-04 NOTE — ED Triage Notes (Signed)
EMS reports pt "missed chair" and fell; denies hitting head; c/o lower back pain, hx sciatica

## 2023-02-07 ENCOUNTER — Other Ambulatory Visit: Payer: Self-pay | Admitting: Internal Medicine

## 2023-02-07 DIAGNOSIS — Z1231 Encounter for screening mammogram for malignant neoplasm of breast: Secondary | ICD-10-CM

## 2023-02-10 ENCOUNTER — Observation Stay: Payer: Medicare Other

## 2023-02-10 ENCOUNTER — Inpatient Hospital Stay
Admission: EM | Admit: 2023-02-10 | Discharge: 2023-02-13 | DRG: 036 | Disposition: A | Discharge status: Home Health | Payer: Medicare Other | Attending: Hospitalist | Admitting: Hospitalist

## 2023-02-10 ENCOUNTER — Other Ambulatory Visit: Payer: Self-pay

## 2023-02-10 ENCOUNTER — Emergency Department: Payer: Medicare Other

## 2023-02-10 DIAGNOSIS — K219 Gastro-esophageal reflux disease without esophagitis: Secondary | ICD-10-CM | POA: Diagnosis present

## 2023-02-10 DIAGNOSIS — I10 Essential (primary) hypertension: Secondary | ICD-10-CM | POA: Diagnosis present

## 2023-02-10 DIAGNOSIS — Z7984 Long term (current) use of oral hypoglycemic drugs: Secondary | ICD-10-CM

## 2023-02-10 DIAGNOSIS — I6522 Occlusion and stenosis of left carotid artery: Secondary | ICD-10-CM | POA: Diagnosis not present

## 2023-02-10 DIAGNOSIS — J45909 Unspecified asthma, uncomplicated: Secondary | ICD-10-CM | POA: Diagnosis present

## 2023-02-10 DIAGNOSIS — I6529 Occlusion and stenosis of unspecified carotid artery: Secondary | ICD-10-CM | POA: Diagnosis present

## 2023-02-10 DIAGNOSIS — Z8673 Personal history of transient ischemic attack (TIA), and cerebral infarction without residual deficits: Secondary | ICD-10-CM

## 2023-02-10 DIAGNOSIS — G629 Polyneuropathy, unspecified: Secondary | ICD-10-CM

## 2023-02-10 DIAGNOSIS — G459 Transient cerebral ischemic attack, unspecified: Principal | ICD-10-CM | POA: Diagnosis present

## 2023-02-10 DIAGNOSIS — Z791 Long term (current) use of non-steroidal anti-inflammatories (NSAID): Secondary | ICD-10-CM

## 2023-02-10 DIAGNOSIS — R29703 NIHSS score 3: Secondary | ICD-10-CM | POA: Diagnosis present

## 2023-02-10 DIAGNOSIS — E785 Hyperlipidemia, unspecified: Secondary | ICD-10-CM | POA: Diagnosis not present

## 2023-02-10 DIAGNOSIS — Z884 Allergy status to anesthetic agent status: Secondary | ICD-10-CM

## 2023-02-10 DIAGNOSIS — E876 Hypokalemia: Secondary | ICD-10-CM | POA: Diagnosis not present

## 2023-02-10 DIAGNOSIS — Z79899 Other long term (current) drug therapy: Secondary | ICD-10-CM

## 2023-02-10 DIAGNOSIS — Z7902 Long term (current) use of antithrombotics/antiplatelets: Secondary | ICD-10-CM

## 2023-02-10 DIAGNOSIS — Z888 Allergy status to other drugs, medicaments and biological substances status: Secondary | ICD-10-CM

## 2023-02-10 DIAGNOSIS — H538 Other visual disturbances: Secondary | ICD-10-CM | POA: Diagnosis present

## 2023-02-10 DIAGNOSIS — R4702 Dysphasia: Secondary | ICD-10-CM | POA: Diagnosis not present

## 2023-02-10 DIAGNOSIS — R200 Anesthesia of skin: Secondary | ICD-10-CM | POA: Diagnosis present

## 2023-02-10 DIAGNOSIS — Z006 Encounter for examination for normal comparison and control in clinical research program: Secondary | ICD-10-CM

## 2023-02-10 DIAGNOSIS — Z91040 Latex allergy status: Secondary | ICD-10-CM

## 2023-02-10 DIAGNOSIS — Z7982 Long term (current) use of aspirin: Secondary | ICD-10-CM

## 2023-02-10 DIAGNOSIS — R471 Dysarthria and anarthria: Secondary | ICD-10-CM | POA: Diagnosis present

## 2023-02-10 DIAGNOSIS — E1140 Type 2 diabetes mellitus with diabetic neuropathy, unspecified: Secondary | ICD-10-CM | POA: Diagnosis present

## 2023-02-10 DIAGNOSIS — Z8249 Family history of ischemic heart disease and other diseases of the circulatory system: Secondary | ICD-10-CM

## 2023-02-10 DIAGNOSIS — R1310 Dysphagia, unspecified: Secondary | ICD-10-CM | POA: Diagnosis present

## 2023-02-10 LAB — URINALYSIS, ROUTINE W REFLEX MICROSCOPIC
Bilirubin Urine: NEGATIVE
Glucose, UA: NEGATIVE mg/dL
Hgb urine dipstick: NEGATIVE
Ketones, ur: NEGATIVE mg/dL
Leukocytes,Ua: NEGATIVE
Nitrite: NEGATIVE
Protein, ur: NEGATIVE mg/dL
Specific Gravity, Urine: 1.003 — ABNORMAL LOW (ref 1.005–1.030)
pH: 6 (ref 5.0–8.0)

## 2023-02-10 LAB — CBG MONITORING, ED: Glucose-Capillary: 152 mg/dL — ABNORMAL HIGH (ref 70–99)

## 2023-02-10 LAB — CBC
HCT: 36.1 % (ref 36.0–46.0)
Hemoglobin: 11.4 g/dL — ABNORMAL LOW (ref 12.0–15.0)
MCH: 29.8 pg (ref 26.0–34.0)
MCHC: 31.6 g/dL (ref 30.0–36.0)
MCV: 94.5 fL (ref 80.0–100.0)
Platelets: 258 10*3/uL (ref 150–400)
RBC: 3.82 MIL/uL — ABNORMAL LOW (ref 3.87–5.11)
RDW: 13.3 % (ref 11.5–15.5)
WBC: 9.2 10*3/uL (ref 4.0–10.5)
nRBC: 0 % (ref 0.0–0.2)

## 2023-02-10 LAB — COMPREHENSIVE METABOLIC PANEL
ALT: 11 U/L (ref 0–44)
AST: 22 U/L (ref 15–41)
Albumin: 3.9 g/dL (ref 3.5–5.0)
Alkaline Phosphatase: 63 U/L (ref 38–126)
Anion gap: 11 (ref 5–15)
BUN: 9 mg/dL (ref 8–23)
CO2: 25 mmol/L (ref 22–32)
Calcium: 9.4 mg/dL (ref 8.9–10.3)
Chloride: 101 mmol/L (ref 98–111)
Creatinine, Ser: 0.84 mg/dL (ref 0.44–1.00)
GFR, Estimated: >60 mL/min (ref >60)
Glucose, Bld: 133 mg/dL — ABNORMAL HIGH (ref 70–99)
Potassium: 3.3 mmol/L — ABNORMAL LOW (ref 3.5–5.1)
Sodium: 137 mmol/L (ref 135–145)
Total Bilirubin: 0.6 mg/dL (ref 0.3–1.2)
Total Protein: 7 g/dL (ref 6.5–8.1)

## 2023-02-10 LAB — DIFFERENTIAL
Abs Immature Granulocytes: 0.05 10*3/uL (ref 0.00–0.07)
Basophils Absolute: 0 10*3/uL (ref 0.0–0.1)
Basophils Relative: 0 %
Eosinophils Absolute: 0.6 10*3/uL — ABNORMAL HIGH (ref 0.0–0.5)
Eosinophils Relative: 6 %
Immature Granulocytes: 1 %
Lymphocytes Relative: 32 %
Lymphs Abs: 3 10*3/uL (ref 0.7–4.0)
Monocytes Absolute: 0.5 10*3/uL (ref 0.1–1.0)
Monocytes Relative: 6 %
Neutro Abs: 5.1 10*3/uL (ref 1.7–7.7)
Neutrophils Relative %: 55 %

## 2023-02-10 LAB — APTT: aPTT: 32 seconds (ref 24–36)

## 2023-02-10 LAB — ETHANOL: Alcohol, Ethyl (B): <10 mg/dL (ref <10)

## 2023-02-10 LAB — PROTIME-INR
INR: 1.1 (ref 0.8–1.2)
Prothrombin Time: 13.7 seconds (ref 11.4–15.2)

## 2023-02-10 MED ORDER — MELATONIN 5 MG PO TABS: 2.5000 mg | ORAL_TABLET | Freq: Every evening | ORAL | Status: DC | PRN

## 2023-02-10 MED ORDER — FUROSEMIDE 20 MG PO TABS
20.0000 mg | ORAL_TABLET | ORAL | Status: DC
  Administered 2023-02-12: 20 mg via ORAL
  Filled 2023-02-10: qty 1

## 2023-02-10 MED ORDER — ENOXAPARIN SODIUM 40 MG/0.4ML IJ SOSY
40.0000 mg | PREFILLED_SYRINGE | INTRAMUSCULAR | Status: DC
  Administered 2023-02-10 – 2023-02-12 (×3): 40 mg via SUBCUTANEOUS
  Filled 2023-02-10 (×3): qty 0.4

## 2023-02-10 MED ORDER — ADULT MULTIVITAMIN W/MINERALS CH
1.0000 | ORAL_TABLET | Freq: Every day | ORAL | Status: DC
  Administered 2023-02-11 – 2023-02-13 (×3): 1 via ORAL
  Filled 2023-02-10 (×3): qty 1

## 2023-02-10 MED ORDER — IOHEXOL 350 MG/ML SOLN
100.0000 mL | Freq: Once | INTRAVENOUS | Status: AC | PRN
  Administered 2023-02-10: 100 mL via INTRAVENOUS

## 2023-02-10 MED ORDER — MAGNESIUM HYDROXIDE 400 MG/5ML PO SUSP: 30.0000 mL | Freq: Every day | ORAL | Status: DC | PRN

## 2023-02-10 MED ORDER — POTASSIUM CHLORIDE CRYS ER 20 MEQ PO TBCR
40.0000 meq | EXTENDED_RELEASE_TABLET | Freq: Once | ORAL | Status: AC
  Administered 2023-02-10: 40 meq via ORAL
  Filled 2023-02-10: qty 2

## 2023-02-10 MED ORDER — ACETAMINOPHEN 650 MG RE SUPP: 650.0000 mg | Freq: Four times a day (QID) | RECTAL | Status: DC | PRN

## 2023-02-10 MED ORDER — VITAMIN D 25 MCG (1000 UNIT) PO TABS
1000.0000 [IU] | ORAL_TABLET | Freq: Every day | ORAL | Status: DC
  Administered 2023-02-11 – 2023-02-13 (×3): 1000 [IU] via ORAL
  Filled 2023-02-10 (×3): qty 1

## 2023-02-10 MED ORDER — ATORVASTATIN CALCIUM 20 MG PO TABS
40.0000 mg | ORAL_TABLET | Freq: Every day | ORAL | Status: DC
  Administered 2023-02-10 – 2023-02-11 (×2): 40 mg via ORAL
  Filled 2023-02-10 (×3): qty 2

## 2023-02-10 MED ORDER — ACETAMINOPHEN 325 MG PO TABS
650.0000 mg | ORAL_TABLET | Freq: Four times a day (QID) | ORAL | Status: DC | PRN
  Administered 2023-02-11 – 2023-02-13 (×4): 650 mg via ORAL
  Filled 2023-02-10 (×4): qty 2

## 2023-02-10 MED ORDER — SODIUM CHLORIDE 0.9% FLUSH: 3.0000 mL | Freq: Once | INTRAVENOUS | Status: DC

## 2023-02-10 MED ORDER — ASPIRIN 81 MG PO CHEW
81.0000 mg | CHEWABLE_TABLET | Freq: Every day | ORAL | Status: DC
  Administered 2023-02-11 – 2023-02-13 (×3): 81 mg via ORAL
  Filled 2023-02-10 (×3): qty 1

## 2023-02-10 MED ORDER — CLOPIDOGREL BISULFATE 75 MG PO TABS: 75.0000 mg | ORAL_TABLET | Freq: Every day | ORAL | Status: DC

## 2023-02-10 MED ORDER — CARVEDILOL 6.25 MG PO TABS
6.2500 mg | ORAL_TABLET | Freq: Two times a day (BID) | ORAL | Status: DC
  Administered 2023-02-11 – 2023-02-13 (×5): 6.25 mg via ORAL
  Filled 2023-02-10 (×5): qty 1

## 2023-02-10 MED ORDER — SODIUM CHLORIDE 0.9 % IV SOLN: INTRAVENOUS | Status: DC

## 2023-02-10 MED ORDER — PROMETHAZINE HCL 25 MG PO TABS: 12.5000 mg | ORAL_TABLET | Freq: Four times a day (QID) | ORAL | Status: DC | PRN

## 2023-02-10 MED ORDER — STROKE: EARLY STAGES OF RECOVERY BOOK: Freq: Once | Status: AC

## 2023-02-10 MED ORDER — POTASSIUM CHLORIDE CRYS ER 20 MEQ PO TBCR
10.0000 meq | EXTENDED_RELEASE_TABLET | ORAL | Status: DC
  Administered 2023-02-12: 10 meq via ORAL
  Filled 2023-02-10: qty 1

## 2023-02-10 MED ORDER — ALBUTEROL SULFATE (2.5 MG/3ML) 0.083% IN NEBU: 3.0000 mL | INHALATION_SOLUTION | Freq: Four times a day (QID) | RESPIRATORY_TRACT | Status: DC | PRN

## 2023-02-10 MED ORDER — MELATONIN 3 MG PO TABS: 3.0000 mg | ORAL_TABLET | Freq: Every evening | ORAL | Status: DC | PRN

## 2023-02-10 NOTE — Assessment & Plan Note (Signed)
-   Will resume antihypertensives while allowing permissive hypertension.

## 2023-02-10 NOTE — H&P (Addendum)
[place]   PATIENT NAME: Destiny Savage    MR#:  [id number]  DATE OF BIRTH:  1951-05-13  DATE OF ADMISSION:  02/10/2023  PRIMARY CARE PHYSICIAN: Albina Billet, MD   Patient is coming from: Home  REQUESTING/REFERRING PHYSICIAN: Rada Hay, MD  CHIEF COMPLAINT:   Chief Complaint  Patient presents with   Code Stroke    HISTORY OF PRESENT ILLNESS:  Destiny Savage is a 72 y.o. African-American female with medical history significant for type 2 diabetes mellitus, hypertension, dyslipidemia, GERD, asthma, TIA and CVA, who presented to the emergency room with acute onset of expressive dysphagia and slurred speech with associated left-sided numbness that started around 5 PM today.  She was having "funny feeling" in her head with associated mild left facial numbness.  She admitted to difficulty with ambulation.  She denied any chest pain or palpitations.  No tinnitus or vertigo.  No witnessed seizures.  No urinary or stool incontinence.  No nausea or vomiting or abdominal pain.  No cough or wheezing or dyspnea.  No dysuria, oliguria or hematuria or flank pain.  She takes her aspirin and Plavix regularly as well as the rest of her medications.  ED Course: When she came to the ER,, BP was 188/82 with otherwise normal vital signs.  Labs revealed hypokalemia of 3.3 with otherwise unremarkable CMP.  CBC showed hemoglobin 11.4 hematocrit 36.1 better than previous levels in October last year.  UA is pending.  Alcohol level is less than 10.  EKG as reviewed by me : EKG showed normal sinus rhythm with a rate of 68 with T wave inversion in V1 and flattened T waves in V2 and V3. Imaging: Noncontrasted CT scan revealed no acute intracranial normalities. Head and neck CTA revealed the following: 1. No emergent large vessel occlusion or high-grade stenosis of the intracranial arteries. 2. Approximately 70% stenosis of the proximal left internal carotid artery secondary to calcified  atherosclerosis. Less than 50% stenosis on the right. 3. Normal CT perfusion.   The patient was given 40 mill Cabbell p.o. potassium chloride.  Vascular surgery was contacted.  She has a teleneurology consult and she was not felt to be candidate for thrombolytic therapy.  She will be admitted to a medical telemetry observation bed for further evaluation and management. PAST MEDICAL HISTORY:   Past Medical History:  Diagnosis Date   Arthritis    Asthma    Diabetes mellitus    GERD (gastroesophageal reflux disease)    Hyperlipidemia    Hypertension    Mini stroke    Stroke [facility])     PAST SURGICAL HISTORY:   Past Surgical History:  Procedure Laterality Date   ABDOMINAL HYSTERECTOMY     BREAST EXCISIONAL BIOPSY Left 1980's   benign   CARDIAC CATHETERIZATION     CHOLECYSTECTOMY      SOCIAL HISTORY:   Social History   Tobacco Use   Smoking status: Never   Smokeless tobacco: Never  Substance Use Topics   Alcohol use: No    FAMILY HISTORY:   Family History  Problem Relation Age of Onset   Heart Problems Father    Hypertension Other    CAD Other    Breast cancer Neg Hx     DRUG ALLERGIES:   Allergies  Allergen Reactions   Latex Hives and Rash    Other reaction(s): Other (See Comments)   Lidocaine Hives   Phenobarbital-Belladonna Alk     Other reaction(s):  Hives & Rash   Phenobarbital-Belladonna Alk Hives and Rash    REVIEW OF SYSTEMS:   ROS As per history of present illness. All pertinent systems were reviewed above. Constitutional, HEENT, cardiovascular, respiratory, GI, GU, musculoskeletal, neuro, psychiatric, endocrine, integumentary and hematologic systems were reviewed and are otherwise negative/unremarkable except for positive findings mentioned above in the HPI.   MEDICATIONS AT HOME:   Prior to Admission medications   Medication Sig Start Date End Date Taking? Authorizing Provider  acetaminophen (TYLENOL) 500 MG tablet Take 500-1,000 mg by mouth  every 8 (eight) hours as needed.   Yes [provider]  albuterol (VENTOLIN HFA) 108 (90 Base) MCG/ACT inhaler Inhale 1 puff into the lungs every 6 (six) hours as needed for wheezing.   Yes [provider]  aspirin 81 MG tablet Take 1 tablet (81 mg total) by mouth daily. 01/11/17  Yes Sudini, Alveta Heimlich, MD  atorvastatin (LIPITOR) 40 MG tablet Take 1 tablet (40 mg total) by mouth daily. 06/05/22  Yes Minna Merritts, MD  carvedilol (COREG) 6.25 MG tablet Take 1 tablet (6.25 mg total) by mouth 2 (two) times daily with a meal. 03/21/20  Yes Gollan, Kathlene November, MD  cholecalciferol (VITAMIN D3) 25 MCG (1000 UNIT) tablet Take 1,000 Units by mouth daily.   Yes [provider]  clopidogrel (PLAVIX) 75 MG tablet Take 1 tablet (75 mg total) by mouth daily. 03/21/20  Yes Minna Merritts, MD  furosemide (LASIX) 20 MG tablet Take 1 tablet (20 mg total) by mouth every Monday, Wednesday, and Friday. 06/05/22  Yes Gollan, Kathlene November, MD  metFORMIN (GLUCOPHAGE-XR) 500 MG 24 hr tablet Take 500 mg by mouth in the morning and 1000 mg at hight. 11/23/19  Yes [provider]  Multiple Vitamin (MULTIVITAMIN WITH MINERALS) TABS tablet Take 1 tablet by mouth daily.   Yes [provider]  polyethylene glycol (MIRALAX) 17 g packet Take 17 g by mouth daily. 09/09/22  Yes Sharen Hones, MD  potassium chloride (KLOR-CON) 10 MEQ tablet Take 1 tablet (10 mEq total) by mouth every Monday, Wednesday, and Friday. 06/05/22  Yes Gollan, Kathlene November, MD  gabapentin (NEURONTIN) 300 MG capsule Take 300 mg by mouth at bedtime. Patient not taking: Reported on 02/10/2023 01/27/23   [provider]  hydrOXYzine (ATARAX) 25 MG tablet Take 25 mg by mouth daily. Patient not taking: Reported on 02/10/2023    [provider]  Lactulose 20 GM/30ML SOLN Take 30 mLs (20 g total) by mouth daily as needed (constipation). Patient not taking: Reported on 02/10/2023 09/09/22   Sharen Hones, MD  meloxicam  (MOBIC) 7.5 MG tablet Take 1 tablet (7.5 mg total) by mouth daily. Patient not taking: Reported on 02/10/2023 02/04/23 02/04/24  Cuthriell, Charline Bills, PA-C  methocarbamol (ROBAXIN) 500 MG tablet Take 500 mg by mouth every 8 (eight) hours as needed. Patient not taking: Reported on 02/10/2023 01/27/23   [provider]  oxyCODONE-acetaminophen (PERCOCET) 5-325 MG tablet Take 1 tablet by mouth every 4 (four) hours as needed for severe pain. Patient not taking: Reported on 02/10/2023 01/25/23   Harvest Dark, MD  pantoprazole (PROTONIX) 40 MG tablet Take 40 mg by mouth daily. Patient not taking: Reported on 02/10/2023 04/18/22   [provider]  predniSONE (DELTASONE) 50 MG tablet Take 1 tablet (50 mg total) by mouth daily with breakfast. Patient not taking: Reported on 02/10/2023 02/04/23   Cuthriell, Charline Bills, PA-C  senna-docusate (SENOKOT-S) 8.6-50 MG tablet Take 1 tablet by mouth  2 (two) times daily as needed for mild constipation. Patient not taking: Reported on 02/10/2023 09/09/22   Sharen Hones, MD      VITAL SIGNS:  Blood pressure (!) 181/87, pulse 63, temperature 98.7 F (37.1 C), resp. rate 18, height 5\' 6"  (1.676 m), weight 89.7 kg, SpO2 100 %.  PHYSICAL EXAMINATION:  Physical Exam  GENERAL:  72 y.o.-year-old African-American female patient lying in the bed with no acute distress.  EYES: Pupils equal, round, reactive to light and accommodation. No scleral icterus. Extraocular muscles intact.  HEENT: Head atraumatic, normocephalic. Oropharynx and nasopharynx clear.  NECK:  Supple, no jugular venous distention. No thyroid enlargement, no tenderness.  LUNGS: Normal breath sounds bilaterally, no wheezing, rales,rhonchi or crepitation. No use of accessory muscles of respiration.  CARDIOVASCULAR: Regular rate and rhythm, S1, S2 normal. No murmurs, rubs, or gallops.  ABDOMEN: Soft, nondistended, nontender. Bowel sounds present. No organomegaly or mass.  EXTREMITIES: No pedal  edema, cyanosis, or clubbing.  NEUROLOGIC: Cranial nerves II through XII are intact except for mild slurred speech and mild expressive dysphasia. Muscle strength 5/5 in all extremities. Sensation intact. Gait not checked.  PSYCHIATRIC: The patient is alert and oriented x 3.  Normal affect and good eye contact. SKIN: No obvious rash, lesion, or ulcer.   LABORATORY PANEL:   CBC Recent Labs  Lab 02/10/23 1803  WBC 9.2  HGB 11.4*  HCT 36.1  PLT 258   ------------------------------------------------------------------------------------------------------------------  Chemistries  Recent Labs  Lab 02/10/23 1803  NA 137  K 3.3*  CL 101  CO2 25  GLUCOSE 133*  BUN 9  CREATININE 0.84  CALCIUM 9.4  AST 22  ALT 11  ALKPHOS 63  BILITOT 0.6   ------------------------------------------------------------------------------------------------------------------  Cardiac Enzymes No results for input(s): "TROPONINI" in the last 168 hours. ------------------------------------------------------------------------------------------------------------------  RADIOLOGY:  CT ANGIO HEAD W OR WO CONTRAST  Result Date: 02/10/2023 CLINICAL DATA:  Blurry vision EXAM: CT ANGIOGRAPHY HEAD AND NECK CT PERFUSION BRAIN TECHNIQUE: Multidetector CT imaging of the head and neck was performed using the standard protocol during bolus administration of intravenous contrast. Multiplanar CT image reconstructions and MIPs were obtained to evaluate the vascular anatomy. Carotid stenosis measurements (when applicable) are obtained utilizing NASCET criteria, using the distal internal carotid diameter as the denominator. Multiphase CT imaging of the brain was performed following IV bolus contrast injection. Subsequent parametric perfusion maps were calculated using RAPID software. RADIATION DOSE REDUCTION: This exam was performed according to the departmental dose-optimization program which includes automated exposure control,  adjustment of the mA and/or kV according to patient size and/or use of iterative reconstruction technique. CONTRAST:  179mL OMNIPAQUE IOHEXOL 350 MG/ML SOLN COMPARISON:  None Available. FINDINGS: CTA NECK FINDINGS SKELETON: There is no bony spinal canal stenosis. No lytic or blastic lesion. OTHER NECK: Normal pharynx, larynx and major salivary glands. No cervical lymphadenopathy. Unremarkable thyroid gland. UPPER CHEST: No pneumothorax or pleural effusion. No nodules or masses. AORTIC ARCH: There is calcific atherosclerosis of the aortic arch. There is no aneurysm, dissection or hemodynamically significant stenosis of the visualized portion of the aorta. Conventional 3 vessel aortic branching pattern. The visualized proximal subclavian arteries are widely patent. RIGHT CAROTID SYSTEM: No dissection, occlusion or aneurysm. There is mixed density atherosclerosis extending into the proximal ICA, resulting in less than 50% stenosis. LEFT CAROTID SYSTEM: No dissection, occlusion or aneurysm. There is calcified atherosclerosis extending into the proximal ICA, resulting in approximately 70% stenosis. VERTEBRAL ARTERIES: Left dominant configuration. Both origins are clearly patent. There  is no dissection, occlusion or flow-limiting stenosis to the skull base (V1-V3 segments). CTA HEAD FINDINGS POSTERIOR CIRCULATION: --Vertebral arteries: Normal V4 segments. --Inferior cerebellar arteries: Normal. --Basilar artery: Normal. --Superior cerebellar arteries: Normal. --Posterior cerebral arteries (PCA): Normal. ANTERIOR CIRCULATION: --Intracranial internal carotid arteries: Normal. --Anterior cerebral arteries (ACA): Normal. Both A1 segments are present. Patent anterior communicating artery (a-comm). --Middle cerebral arteries (MCA): Normal. VENOUS SINUSES: As permitted by contrast timing, patent. ANATOMIC VARIANTS: Fetal origins of both posterior cerebral arteries. Review of the MIP images confirms the above findings. CT Brain  Perfusion Findings: ASPECTS: 10 CBF (<30%) Volume: 41mL Perfusion (Tmax>6.0s) volume: 40mL Mismatch Volume: 47mL Infarction Location:None IMPRESSION: 1. No emergent large vessel occlusion or high-grade stenosis of the intracranial arteries. 2. 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 Less than 50% stenosis on the right. 3. Normal CT perfusion. Electronically Signed   By: Ulyses Jarred M.D.   On: 02/10/2023 19:38   CT ANGIO NECK W OR WO CONTRAST  Result Date: 02/10/2023 CLINICAL DATA:  Blurry vision EXAM: CT ANGIOGRAPHY HEAD AND NECK CT PERFUSION BRAIN TECHNIQUE: Multidetector CT imaging of the head and neck was performed using the standard protocol during bolus administration of intravenous contrast. Multiplanar CT image reconstructions and MIPs were obtained to evaluate the vascular anatomy. Carotid stenosis measurements (when applicable) are obtained utilizing NASCET criteria, using the distal internal carotid diameter as the denominator. Multiphase CT imaging of the brain was performed following IV bolus contrast injection. Subsequent parametric perfusion maps were calculated using RAPID software. RADIATION DOSE REDUCTION: This exam was performed according to the departmental dose-optimization program which includes automated exposure control, adjustment of the mA and/or kV according to patient size and/or use of iterative reconstruction technique. CONTRAST:  112mL OMNIPAQUE IOHEXOL 350 MG/ML SOLN COMPARISON:  None Available. FINDINGS: CTA NECK FINDINGS SKELETON: There is no bony spinal canal stenosis. No lytic or blastic lesion. OTHER NECK: Normal pharynx, larynx and major salivary glands. No cervical lymphadenopathy. Unremarkable thyroid gland. UPPER CHEST: No pneumothorax or pleural effusion. No nodules or masses. AORTIC ARCH: There is calcific atherosclerosis of the aortic arch. There is no aneurysm, dissection or hemodynamically  significant stenosis of the visualized portion of the aorta. Conventional 3 vessel aortic branching pattern. The visualized proximal subclavian arteries are widely patent. RIGHT CAROTID SYSTEM: No dissection, occlusion or aneurysm. There is mixed density atherosclerosis extending into the proximal ICA, resulting in less than 50% stenosis. LEFT CAROTID SYSTEM: No dissection, occlusion or aneurysm. There is calcified atherosclerosis extending into the proximal ICA, resulting in approximately 70% stenosis. VERTEBRAL ARTERIES: Left dominant configuration. Both origins are clearly patent. There is no dissection, occlusion or flow-limiting stenosis to the skull base (V1-V3 segments). CTA HEAD FINDINGS POSTERIOR CIRCULATION: --Vertebral arteries: Normal V4 segments. --Inferior cerebellar arteries: Normal. --Basilar artery: Normal. --Superior cerebellar arteries: Normal. --Posterior cerebral arteries (PCA): Normal. ANTERIOR CIRCULATION: --Intracranial internal carotid arteries: Normal. --Anterior cerebral arteries (ACA): Normal. Both A1 segments are present. Patent anterior communicating artery (a-comm). --Middle cerebral arteries (MCA): Normal. VENOUS SINUSES: As permitted by contrast timing, patent. ANATOMIC VARIANTS: Fetal origins of both posterior cerebral arteries. Review of the MIP images confirms the above findings. CT Brain Perfusion Findings: ASPECTS: 10 CBF (<30%) Volume: 11mL Perfusion (Tmax>6.0s) volume: 85mL Mismatch Volume: 64mL Infarction Location:None IMPRESSION: 1. No emergent large vessel occlusion or high-grade stenosis of the intracranial arteries. 2. 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 Less than 50% stenosis on  the right. 3. Normal CT perfusion. Electronically Signed   By: Ulyses Jarred M.D.   On: 02/10/2023 19:38   CT CEREBRAL PERFUSION W CONTRAST  Result Date: 02/10/2023 CLINICAL DATA:  Blurry vision EXAM: CT ANGIOGRAPHY HEAD  AND NECK CT PERFUSION BRAIN TECHNIQUE: Multidetector CT imaging of the head and neck was performed using the standard protocol during bolus administration of intravenous contrast. Multiplanar CT image reconstructions and MIPs were obtained to evaluate the vascular anatomy. Carotid stenosis measurements (when applicable) are obtained utilizing NASCET criteria, using the distal internal carotid diameter as the denominator. Multiphase CT imaging of the brain was performed following IV bolus contrast injection. Subsequent parametric perfusion maps were calculated using RAPID software. RADIATION DOSE REDUCTION: This exam was performed according to the departmental dose-optimization program which includes automated exposure control, adjustment of the mA and/or kV according to patient size and/or use of iterative reconstruction technique. CONTRAST:  128mL OMNIPAQUE IOHEXOL 350 MG/ML SOLN COMPARISON:  None Available. FINDINGS: CTA NECK FINDINGS SKELETON: There is no bony spinal canal stenosis. No lytic or blastic lesion. OTHER NECK: Normal pharynx, larynx and major salivary glands. No cervical lymphadenopathy. Unremarkable thyroid gland. UPPER CHEST: No pneumothorax or pleural effusion. No nodules or masses. AORTIC ARCH: There is calcific atherosclerosis of the aortic arch. There is no aneurysm, dissection or hemodynamically significant stenosis of the visualized portion of the aorta. Conventional 3 vessel aortic branching pattern. The visualized proximal subclavian arteries are widely patent. RIGHT CAROTID SYSTEM: No dissection, occlusion or aneurysm. There is mixed density atherosclerosis extending into the proximal ICA, resulting in less than 50% stenosis. LEFT CAROTID SYSTEM: No dissection, occlusion or aneurysm. There is calcified atherosclerosis extending into the proximal ICA, resulting in approximately 70% stenosis. VERTEBRAL ARTERIES: Left dominant configuration. Both origins are clearly patent. There is no  dissection, occlusion or flow-limiting stenosis to the skull base (V1-V3 segments). CTA HEAD FINDINGS POSTERIOR CIRCULATION: --Vertebral arteries: Normal V4 segments. --Inferior cerebellar arteries: Normal. --Basilar artery: Normal. --Superior cerebellar arteries: Normal. --Posterior cerebral arteries (PCA): Normal. ANTERIOR CIRCULATION: --Intracranial internal carotid arteries: Normal. --Anterior cerebral arteries (ACA): Normal. Both A1 segments are present. Patent anterior communicating artery (a-comm). --Middle cerebral arteries (MCA): Normal. VENOUS SINUSES: As permitted by contrast timing, patent. ANATOMIC VARIANTS: Fetal origins of both posterior cerebral arteries. Review of the MIP images confirms the above findings. CT Brain Perfusion Findings: ASPECTS: 10 CBF (<30%) Volume: 15mL Perfusion (Tmax>6.0s) volume: 30mL Mismatch Volume: 70mL Infarction Location:None IMPRESSION: 1. No emergent large vessel occlusion or high-grade stenosis of the intracranial arteries. 2. 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 Less than 50% stenosis on the right. 3. Normal CT perfusion. Electronically Signed   By: Ulyses Jarred M.D.   On: 02/10/2023 19:38   CT HEAD CODE STROKE WO CONTRAST  Result Date: 02/10/2023 CLINICAL DATA:  Code stroke. Neuro deficit, acute, stroke suspected. EXAM: CT HEAD WITHOUT CONTRAST TECHNIQUE: Contiguous axial images were obtained from the base of the skull through the vertex without intravenous contrast. RADIATION DOSE REDUCTION: This exam was performed according to the departmental dose-optimization program which includes automated exposure control, adjustment of the mA and/or kV according to patient size and/or use of iterative reconstruction technique. COMPARISON:  Head CT 05/18/2022. FINDINGS: Brain: No acute hemorrhage, mass effect or midline shift. Gray-white differentiation is preserved. No hydrocephalus. No extra-axial  collection. Basilar cisterns are patent. Vascular: No hyperdense vessel or unexpected calcification. Skull: No calvarial fracture or suspicious bone lesion. Skull base is unremarkable.  Sinuses/Orbits: Unremarkable. Other: None. ASPECTS ([place] Stroke Program Early CT Score) - Ganglionic level infarction (caudate, lentiform nuclei, internal capsule, insula, M1-M3 cortex): 7 - Supraganglionic infarction (M4-M6 cortex): 3 Total score (0-10 with 10 being normal): 10 IMPRESSION: No acute intracranial hemorrhage or evidence of evolving large vessel territory infarct. ASPECT score is 10. Code stroke imaging results were communicated on 02/10/2023 at 6:18 pm to provider Dr. Starleen Blue via telephone, who verbally acknowledged these results. Electronically Signed   By: Emmit Alexanders M.D.   On: 02/10/2023 18:18      IMPRESSION AND PLAN:  Assessment and Plan: * TIA (transient ischemic attack) - The patient will be admitted to an observation medically monitored bed. - This is manifested by left-sided numbness and expressive dysphasia with dysarthria - We will follow neuro checks q.4 hours for 24 hours.   - The patient will be continued on aspirin and Plavix. - Will obtain a brain MRI without contrast as well as  2D echo with bubble study .   - A neurology consultation  as well as physical/occupation/speech therapy consults will be obtained in a.m.Marland Kitchen - I notified Dr. Quinn Axe about the patient. - The patient will be placed on statin therapy and fasting lipids will be checked.   Internal carotid artery stenosis, left - The patient will be continued on aspirin and Plavix. - Vascular surgery consult will be obtained. - Dr. Delana Meyer was notified about the patient.  Hypokalemia - We will replace potassium and check magnesium level.  Dyslipidemia - We will continue statin therapy and check fasting lipids.  Essential hypertension - Will resume antihypertensives while allowing permissive hypertension.  GERD  without esophagitis - We will continue PPI therapy.  Peripheral neuropathy - We will continue Neurontin.  DVT prophylaxis: Lovenox.  Advanced Care Planning:  Code Status: full code.  Family Communication:  The plan of care was discussed in details with the patient (and family). I answered all questions. The patient agreed to proceed with the above mentioned plan. Further management will depend upon hospital course. Disposition Plan: Back to previous home environment Consults called: Neurology and vascular surgery. All the records are reviewed and case discussed with ED provider.  Status is: Observation  I certify that at the time of admission, it is my clinical judgment that the patient will require  hospital care extending less than 2 midnights.                            Dispo: The patient is from: Home              Anticipated d/c is to: Home              Patient currently is not medically stable to d/c.              Difficult to place patient: No  Christel Mormon M.D on 02/10/2023 at 10:38 PM  [facility]   From 7 PM-7 AM, contact night-coverage [email or web address]  CC: Primary care physician; Albina Billet, MD

## 2023-02-10 NOTE — Assessment & Plan Note (Addendum)
-   The patient will be continued on aspirin and Plavix. - Vascular surgery consult will be obtained. - Dr. Delana Meyer was notified about the patient.

## 2023-02-10 NOTE — Consult Note (Signed)
[facility] Tele[facility]urology Consult Services   Patient Name:   Destiny Savage, Destiny Savage Date of Birth:   11/28/1950 Identification Number:   MRN - [id number] Date of Service:   02/10/2023 18:08:30  Diagnosis:       R29.818 - Transient neurological symptoms  Impression:      72 yo female with history of HTN, HLD, DM, prior strokes on ASA/Plavix presenting to the ED with sudden onset of blurred vision and language difficulty, now improved. TLKW ~1700. NIHSS 3 for incorrectly stating the month, mild impaired fluency and decreased sensation on Left. CT Head negative for acute findings. CTA shows high grade stenosis of Left ICA, no LVO. Concern for TIA v focal seizure v toxic/metabolic etiologies. Plan to admit for MRI Brain wo and stroke work-up. Continue ASA/Plavix. Permissive HTN. Consider routine EEG. Vascular surgery consult for Left ICA stenosis. Infectious/metabolic work-up per primary team.  Our recommendations are outlined below.  Recommendations:        Stroke/Telemetry Floor       Neuro Checks       Bedside Swallow Eval       DVT Prophylaxis       IV Fluids, Normal Saline       Head of Bed 30 Degrees       Euglycemia and Avoid Hyperthermia (PRN Acetaminophen)       Initiate dual antiplatelet therapy with Aspirin 81 mg daily and Clopidogrel 75 mg daily.       Antihypertensives PRN if Blood pressure is greater than 220/120 or there is a concern for End organ damage/contraindications for permissive HTN. If blood pressure is greater than 220/120 give labetalol PO or IV or Vasotec IV with a goal of 15% reduction in BP during the first 24 hours.  Sign Out:       Discussed with Emergency Department Provider    ------------------------------------------------------------------------------  Advanced Imaging: CTA Head and Neck Completed.  CTP Completed.  LVO:No  Patient in not a candidate for NIR   Metrics: Last Known Well: 02/10/2023 17:00:00 [facility]  Notification Time: 02/10/2023 18:08:30 Arrival Time: 02/10/2023 18:00:00 Stamp Time: 02/10/2023 18:08:30 Initial Response Time: 02/10/2023 18:12:39 Symptoms: blurred vision, difficulty talking. Initial patient interaction: 02/10/2023 18:17:11 NIHSS Assessment Completed: 02/10/2023 18:28:08 Patient is not a candidate for Thrombolytic. Thrombolytic Medical Decision: 02/10/2023 18:28:09 Patient was not deemed candidate for Thrombolytic because of following reasons: Resolved symptoms (no residual disabling symptoms).  CT head showed no acute hemorrhage or acute core infarct.  Primary Provider Notified of Diagnostic Impression and Management Plan on: 02/10/2023 18:58:18    ------------------------------------------------------------------------------  History of Present Illness: Patient is a 72 year old Female.  Patient was brought by EMS for symptoms of blurred vision, difficulty talking. Patient reports that she was sitting on her chair when she told her husband that she was not feeling well at ~1700. Developed some blurred vision, difficulty talking. Husband at bedside notes that her speech is much better now. Patient notes that she felt like she was going to pass out. Per chart review, has had multiple similar presentations in the past.   Past Medical History:      Hypertension      Diabetes Mellitus      Hyperlipidemia      Stroke  Medications:  No Anticoagulant use  Antiplatelet use: Yes ASA/Plavix Reviewed EMR for current medications  Allergies:  Reviewed  Social History: Smoking: No Alcohol Use: No Drug Use: No  Family History:  There is no family history of premature cerebrovascular  disease pertinent to this consultation  ROS : 14 Points Review of Systems was performed and was negative except mentioned in HPI.  Past Surgical History: There Is No Surgical History Contributory To Today's Visit    Examination: BP(182/88), Pulse(85), 1A: Level of  Consciousness - Alert; keenly responsive + 0 1B: Ask Month and Age - 1 Question Right + 1 1C: Blink Eyes & Squeeze Hands - Performs Both Tasks + 0 2: Test Horizontal Extraocular Movements - Normal + 0 3: Test Visual Fields - No Visual Loss + 0 4: Test Facial Palsy (Use Grimace if Obtunded) - Normal symmetry + 0 5A: Test Left Arm Motor Drift - No Drift for 10 Seconds + 0 5B: Test Right Arm Motor Drift - No Drift for 10 Seconds + 0 6A: Test Left Leg Motor Drift - No Drift for 5 Seconds + 0 6B: Test Right Leg Motor Drift - No Drift for 5 Seconds + 0 7: Test Limb Ataxia (FNF/Heel-Shin) - No Ataxia + 0 8: Test Sensation - Mild-Moderate Loss: Can Sense Being Touched + 1 9: Test Language/Aphasia - Mild-Moderate Aphasia: Some Obvious Changes, Without Significant Limitation + 1 10: Test Dysarthria - Normal + 0 11: Test Extinction/Inattention - No abnormality + 0  NIHSS Score: 3  NIHSS Free Text : Decreased Left face/arm/leg  Pre-Morbid Modified Rankin Scale: 0 Points = No symptoms at all  Spoke with : Dr. Starleen Blue  Patient/Family was informed the Neurology Consult would occur via TeleHealth consult by way of interactive audio and video telecommunications and consented to receiving care in this manner.   Patient is being evaluated for possible acute neurologic impairment and high probability of imminent or life-threatening deterioration. I spent total of 30 minutes providing care to this patient, including time for face to face visit via telemedicine, review of medical records, imaging studies and discussion of findings with providers, the patient and/or family.   Dr Janeece Agee   [facility] For Inpatient follow-up with [facility] physician please call [facility] [phone]. This is not an outpatient service. Post hospital discharge, please contact hospital directly.  Please do not communicate with [facility] physicians via secure chat. If you have any questions, Please  contact [facility]. Please call or reconsult our service if there are any clinical or diagnostic changes.

## 2023-02-10 NOTE — Assessment & Plan Note (Addendum)
-   The patient will be admitted to an observation medically monitored bed. - This is manifested by left-sided numbness and expressive dysphasia with dysarthria - We will follow neuro checks q.4 hours for 24 hours.   - The patient will be continued on aspirin and Plavix. - Will obtain a brain MRI without contrast as well as  2D echo with bubble study .   - A neurology consultation  as well as physical/occupation/speech therapy consults will be obtained in a.m.Marland Kitchen - I notified Dr. Quinn Axe about the patient. - The patient will be placed on statin therapy and fasting lipids will be checked.

## 2023-02-10 NOTE — Consult Note (Signed)
1805: Code stroke cart activated at this time. Pt in CT at time of code stroke activation. Pt c/o not felling well, blurred vision, difficulty speaking around 5pm.   1808: TSP paged at this time.  1813: Patient returned from CT at this time.  1813: Dr. Henriette Combs on camera at this time. Patient report and history provided to Dr. Henriette Combs at this time. NCCT imaging results provided to TSP on camera.  1815: TSP performing neuro assessment at this time.

## 2023-02-10 NOTE — ED Provider Notes (Signed)
[facility] Provider Note    Event Date/Time   First MD Initiated Contact with Patient 02/10/23 1811     (approximate)   History   Code Stroke   HPI  CESSILY PUGSLEY is a 72 y.o. female past medical history of hypertension, hyperlipidemia who presents with headache and difficulty talking.  Patient tells me that around 5 PM she sat down on the couch started feeling not right.  Initially had a left-sided headache had some difficulty seeing and felt she was having difficulty speaking.  She is denying any focal numbness tingling or weakness.  When EMS arrived she did have some aphasia had poor effort with strength testing in the bilateral upper extremities.  On route to ED speech seem to be improving.   Currently she is denying headache or vision change.  She points to her head and says she just does not feel right.  Is denying frank dizziness.  Does tell me that the left arm and leg feel numb compared to the right.     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   Stroke ([place]) 01/10/2017   Mixed hyperlipidemia 11/21/2016   Neck pain 11/21/2016   Chest pain 12/23/2015   GERD (gastroesophageal reflux disease) 12/23/2015   Abdominal pain, acute on chronic 04/30/2013   Vertigo 05/11/2012   Hypertension 05/11/2012   Diabetes type 2, controlled ([place]) 05/11/2012   Neck pain on right side 05/11/2012     Physical Exam  Triage Vital Signs: ED Triage Vitals  Enc Vitals Group     BP 02/10/23  1810 (!) 188/82     Pulse Rate 02/10/23 1810 70     Resp 02/10/23 1810 17     Temp 02/10/23 1822 98 F (36.7 C)     Temp Source 02/10/23 1822 Oral     SpO2 02/10/23 1810 100 %     Weight --      Height --      Head Circumference --      Peak Flow --      Pain Score 02/10/23 1822 0     Pain Loc --      Pain Edu? --      Excl. in [place]? --     Most recent vital signs: Vitals:   02/10/23 1841 02/10/23 1900  BP:  126/69  Pulse: 73 70  Resp: 19 17  Temp:    SpO2: 100% 100%     General: Awake, no distress.  CV:  Good peripheral perfusion.  Resp:  Normal effort.  Abd:  No distention.  Neuro:             Awake, Alert, Oriented x 3  Other:  Aox3, patient intermittently seems to have difficulty getting her words out and then speaks quietly however she then intermittently speaks without any aphasia and repetition is intact as is naming PERRL, EOMI, face symmetric, nml tongue movement  5/5 strength in the BL  upper and lower extremities  Subjective decrease sensation to light touch in the left upper and lower extremity compared to normal on the right Finger-nose-finger intact BL    ED Results / Procedures / Treatments  Labs (all labs ordered are listed, but only abnormal results are displayed) Labs Reviewed  CBC - Abnormal; Notable for the following components:      Result Value   RBC 3.82 (*)    Hemoglobin 11.4 (*)    All other components within normal limits  DIFFERENTIAL - Abnormal; Notable for the following components:   Eosinophils Absolute 0.6 (*)    All other components within normal limits  COMPREHENSIVE METABOLIC PANEL - Abnormal; Notable for the following components:   Potassium 3.3 (*)    Glucose, Bld 133 (*)    All other components within normal limits  CBG MONITORING, ED - Abnormal; Notable for the following components:   Glucose-Capillary 152 (*)    All other components within normal limits  PROTIME-INR  APTT  ETHANOL  URINALYSIS, ROUTINE W REFLEX  MICROSCOPIC     EKG  EKG interpretation performed by myself: NSR, nml axis, nml intervals, no acute ischemic changes    RADIOLOGY I reviewed and interpreted the CT scan of the brain which does not show any acute intracranial process    PROCEDURES:  Critical Care performed: No  Procedures  The patient is on the cardiac monitor to evaluate for evidence of arrhythmia and/or significant heart rate changes.   MEDICATIONS ORDERED IN ED: Medications  sodium chloride flush (NS) 0.9 % injection 3 mL (0 mLs Intravenous Hold 02/10/23 1826)  potassium chloride SA (KLOR-CON M) CR tablet 40 mEq (40 mEq Oral Given 02/10/23 1912)  iohexol (OMNIPAQUE) 350 MG/ML injection 100 mL (100 mLs Intravenous Contrast Given 02/10/23 1917)     IMPRESSION / MDM / ASSESSMENT AND PLAN / ED COURSE  I reviewed the triage vital signs and the nursing notes.                              Patient's presentation is most consistent with acute presentation with potential threat to life or bodily function.  Differential diagnosis includes, but is not limited to, CVA, TIA, hypertensive encephalopathy, intracranial hemorrhage, complex migraine  73 year old female presents with somewhat vague neurologic symptoms that started around 5 PM today.  Acutely she felt unwell like she could not think straight and was having some difficulty word finding.  Initially she did not describe any numbness tingling but on exam she does also say she feels less sensation of left arm and left leg.  When EMS arrived they noted her to have some aphasia and poor effort lifting bilateral upper extremities.  Stroke alert was called from the field.  On arrival to ED patient is able to speak with some mild aphasia and numbness in the left arm and left leg but equal strength.  NIH stroke scale is 3.  CT head is negative.  Patient denies other symptoms including infectious and ischemic symptoms.  She is denying headache currently.   Patient was  evaluated by teleneurology who does want patient admitted for stroke workup.  Including MRI brain and CT angio and CT perfusion study.  CT angio does show 70% left ICA stenosis.  At request of the hospitalist I did confirm with Dr. Delana Meyer that vascular does perform carotid endarterectomies at [facility].  Neurology recommends continuing aspirin Plavix vascular consult and admission for MRI.  I discussed with the hospitalist will admit the patient.     FINAL CLINICAL IMPRESSION(S) / ED DIAGNOSES   Final diagnoses:  TIA (transient ischemic attack)     Rx / DC Orders   ED Discharge Orders     None        Note:  This document was prepared using Dragon voice recognition software and may include unintentional dictation errors.   Rada Hay, MD 02/10/23 2035

## 2023-02-10 NOTE — Assessment & Plan Note (Signed)
-   We will continue statin therapy and check fasting lipids. 

## 2023-02-10 NOTE — Assessment & Plan Note (Addendum)
-  We will replace potassium and check magnesium level. 

## 2023-02-10 NOTE — Assessment & Plan Note (Signed)
-   We will continue PPI therapy 

## 2023-02-10 NOTE — Progress Notes (Signed)
   CODE STROKE- PHARMACY COMMUNICATION   Time CODE STROKE called/page received:1800  Time response to CODE STROKE was made (in person or via phone): 1805  Time Stroke Kit retrieved from [place] (only if needed):N/A  Name of Provider/Nurse contacted:Dr. Figurelle/Dr. Starleen Blue  Past Medical History:  Diagnosis Date   Arthritis    Asthma    Diabetes mellitus    GERD (gastroesophageal reflux disease)    Hyperlipidemia    Hypertension    Mini stroke    Stroke [facility])    Prior to Admission medications   Medication Sig Start Date End Date Taking? Authorizing Provider  acetaminophen (TYLENOL) 500 MG tablet Take 500-1,000 mg by mouth every 8 (eight) hours as needed.    [provider]  albuterol (VENTOLIN HFA) 108 (90 Base) MCG/ACT inhaler Inhale 1 puff into the lungs every 6 (six) hours as needed for wheezing.    [provider]  aspirin 81 MG tablet Take 1 tablet (81 mg total) by mouth daily. 01/11/17   Hillary Bow, MD  atorvastatin (LIPITOR) 40 MG tablet Take 1 tablet (40 mg total) by mouth daily. 06/05/22   Minna Merritts, MD  carvedilol (COREG) 6.25 MG tablet Take 1 tablet (6.25 mg total) by mouth 2 (two) times daily with a meal. 03/21/20   Gollan, Kathlene November, MD  cholecalciferol (VITAMIN D3) 25 MCG (1000 UNIT) tablet Take 1,000 Units by mouth daily.    [provider]  clopidogrel (PLAVIX) 75 MG tablet Take 1 tablet (75 mg total) by mouth daily. 03/21/20   Minna Merritts, MD  furosemide (LASIX) 20 MG tablet Take 1 tablet (20 mg total) by mouth every Monday, Wednesday, and Friday. 06/05/22   Minna Merritts, MD  Lactulose 20 GM/30ML SOLN Take 30 mLs (20 g total) by mouth daily as needed (constipation). 09/09/22   Sharen Hones, MD  meloxicam (MOBIC) 7.5 MG tablet Take 1 tablet (7.5 mg total) by mouth daily. 02/04/23 02/04/24  Cuthriell, Charline Bills, PA-C  metFORMIN (GLUCOPHAGE-XR) 500 MG 24 hr tablet Take 1,000 mg by mouth daily. 11/23/19   [provider]   Multiple Vitamin (MULTIVITAMIN WITH MINERALS) TABS tablet Take 1 tablet by mouth daily.    [provider]  oxyCODONE-acetaminophen (PERCOCET) 5-325 MG tablet Take 1 tablet by mouth every 4 (four) hours as needed for severe pain. 01/25/23   Harvest Dark, MD  pantoprazole (PROTONIX) 40 MG tablet Take 40 mg by mouth daily. 04/18/22   [provider]  polyethylene glycol (MIRALAX) 17 g packet Take 17 g by mouth daily. 09/09/22   Sharen Hones, MD  potassium chloride (KLOR-CON) 10 MEQ tablet Take 1 tablet (10 mEq total) by mouth every Monday, Wednesday, and Friday. 06/05/22   Minna Merritts, MD  predniSONE (DELTASONE) 50 MG tablet Take 1 tablet (50 mg total) by mouth daily with breakfast. 02/04/23   Cuthriell, Charline Bills, PA-C  senna-docusate (SENOKOT-S) 8.6-50 MG tablet Take 1 tablet by mouth 2 (two) times daily as needed for mild constipation. 09/09/22   Sharen Hones, MD    Lorin Picket ,PharmD Clinical Pharmacist  02/10/2023  6:36 PM

## 2023-02-10 NOTE — Assessment & Plan Note (Signed)
-   We will continue Neurontin. ?

## 2023-02-11 ENCOUNTER — Encounter: Payer: Self-pay | Admitting: Family Medicine

## 2023-02-11 ENCOUNTER — Observation Stay (HOSPITAL_COMMUNITY)
Admit: 2023-02-11 | Discharge: 2023-02-11 | Disposition: A | Discharge status: Home / Self Care | Payer: Medicare Other | Attending: Family Medicine | Admitting: Family Medicine

## 2023-02-11 DIAGNOSIS — Z91040 Latex allergy status: Secondary | ICD-10-CM | POA: Diagnosis not present

## 2023-02-11 DIAGNOSIS — H538 Other visual disturbances: Secondary | ICD-10-CM | POA: Diagnosis present

## 2023-02-11 DIAGNOSIS — Z006 Encounter for examination for normal comparison and control in clinical research program: Secondary | ICD-10-CM | POA: Diagnosis not present

## 2023-02-11 DIAGNOSIS — R1310 Dysphagia, unspecified: Secondary | ICD-10-CM | POA: Diagnosis present

## 2023-02-11 DIAGNOSIS — I6522 Occlusion and stenosis of left carotid artery: Principal | ICD-10-CM

## 2023-02-11 DIAGNOSIS — Z884 Allergy status to anesthetic agent status: Secondary | ICD-10-CM | POA: Diagnosis not present

## 2023-02-11 DIAGNOSIS — I10 Essential (primary) hypertension: Secondary | ICD-10-CM

## 2023-02-11 DIAGNOSIS — G459 Transient cerebral ischemic attack, unspecified: Secondary | ICD-10-CM

## 2023-02-11 DIAGNOSIS — E876 Hypokalemia: Secondary | ICD-10-CM | POA: Diagnosis present

## 2023-02-11 DIAGNOSIS — J45909 Unspecified asthma, uncomplicated: Secondary | ICD-10-CM | POA: Diagnosis present

## 2023-02-11 DIAGNOSIS — Z888 Allergy status to other drugs, medicaments and biological substances status: Secondary | ICD-10-CM | POA: Diagnosis not present

## 2023-02-11 DIAGNOSIS — Z7902 Long term (current) use of antithrombotics/antiplatelets: Secondary | ICD-10-CM | POA: Diagnosis not present

## 2023-02-11 DIAGNOSIS — R29703 NIHSS score 3: Secondary | ICD-10-CM | POA: Diagnosis present

## 2023-02-11 DIAGNOSIS — I6529 Occlusion and stenosis of unspecified carotid artery: Secondary | ICD-10-CM | POA: Diagnosis present

## 2023-02-11 DIAGNOSIS — R200 Anesthesia of skin: Secondary | ICD-10-CM | POA: Diagnosis present

## 2023-02-11 DIAGNOSIS — E1140 Type 2 diabetes mellitus with diabetic neuropathy, unspecified: Secondary | ICD-10-CM | POA: Diagnosis present

## 2023-02-11 DIAGNOSIS — Z8249 Family history of ischemic heart disease and other diseases of the circulatory system: Secondary | ICD-10-CM | POA: Diagnosis not present

## 2023-02-11 DIAGNOSIS — Z791 Long term (current) use of non-steroidal anti-inflammatories (NSAID): Secondary | ICD-10-CM | POA: Diagnosis not present

## 2023-02-11 DIAGNOSIS — R471 Dysarthria and anarthria: Secondary | ICD-10-CM | POA: Diagnosis present

## 2023-02-11 DIAGNOSIS — Z8673 Personal history of transient ischemic attack (TIA), and cerebral infarction without residual deficits: Secondary | ICD-10-CM

## 2023-02-11 DIAGNOSIS — R4702 Dysphasia: Secondary | ICD-10-CM | POA: Diagnosis present

## 2023-02-11 DIAGNOSIS — E785 Hyperlipidemia, unspecified: Secondary | ICD-10-CM | POA: Diagnosis present

## 2023-02-11 DIAGNOSIS — Z7984 Long term (current) use of oral hypoglycemic drugs: Secondary | ICD-10-CM | POA: Diagnosis not present

## 2023-02-11 DIAGNOSIS — K219 Gastro-esophageal reflux disease without esophagitis: Secondary | ICD-10-CM | POA: Diagnosis present

## 2023-02-11 DIAGNOSIS — Q2549 Other congenital malformations of aorta: Secondary | ICD-10-CM | POA: Diagnosis not present

## 2023-02-11 DIAGNOSIS — Z79899 Other long term (current) drug therapy: Secondary | ICD-10-CM | POA: Diagnosis not present

## 2023-02-11 DIAGNOSIS — Z7982 Long term (current) use of aspirin: Secondary | ICD-10-CM | POA: Diagnosis not present

## 2023-02-11 LAB — LIPID PANEL
Cholesterol: 143 mg/dL (ref 0–200)
HDL: 60 mg/dL (ref >40)
LDL Cholesterol: 71 mg/dL (ref 0–99)
Total CHOL/HDL Ratio: 2.4 RATIO
Triglycerides: 59 mg/dL (ref <150)
VLDL: 12 mg/dL (ref 0–40)

## 2023-02-11 LAB — GLUCOSE, CAPILLARY
Glucose-Capillary: 114 mg/dL — ABNORMAL HIGH (ref 70–99)
Glucose-Capillary: 124 mg/dL — ABNORMAL HIGH (ref 70–99)

## 2023-02-11 LAB — BASIC METABOLIC PANEL
Anion gap: 5 (ref 5–15)
BUN: 9 mg/dL (ref 8–23)
CO2: 28 mmol/L (ref 22–32)
Calcium: 8.9 mg/dL (ref 8.9–10.3)
Chloride: 101 mmol/L (ref 98–111)
Creatinine, Ser: 0.77 mg/dL (ref 0.44–1.00)
GFR, Estimated: >60 mL/min (ref >60)
Glucose, Bld: 158 mg/dL — ABNORMAL HIGH (ref 70–99)
Potassium: 3.8 mmol/L (ref 3.5–5.1)
Sodium: 134 mmol/L — ABNORMAL LOW (ref 135–145)

## 2023-02-11 LAB — ECHOCARDIOGRAM COMPLETE BUBBLE STUDY
AR max vel: 2.18 cm2
AV Area VTI: 2.27 cm2
AV Area mean vel: 2.07 cm2
AV Mean grad: 3 mmHg
AV Peak grad: 6.2 mmHg
Ao pk vel: 1.25 m/s
Area-P 1/2: 2.66 cm2
MV VTI: 1.83 cm2
S' Lateral: 2.5 cm

## 2023-02-11 LAB — CBC
HCT: 34 % — ABNORMAL LOW (ref 36.0–46.0)
Hemoglobin: 10.9 g/dL — ABNORMAL LOW (ref 12.0–15.0)
MCH: 29.8 pg (ref 26.0–34.0)
MCHC: 32.1 g/dL (ref 30.0–36.0)
MCV: 92.9 fL (ref 80.0–100.0)
Platelets: 242 10*3/uL (ref 150–400)
RBC: 3.66 MIL/uL — ABNORMAL LOW (ref 3.87–5.11)
RDW: 13.2 % (ref 11.5–15.5)
WBC: 9.6 10*3/uL (ref 4.0–10.5)
nRBC: 0 % (ref 0.0–0.2)

## 2023-02-11 MED ORDER — HYDRALAZINE HCL 20 MG/ML IJ SOLN
20.0000 mg | Freq: Four times a day (QID) | INTRAMUSCULAR | Status: DC | PRN
  Administered 2023-02-12: 20 mg via INTRAVENOUS
  Filled 2023-02-11: qty 1

## 2023-02-11 MED ORDER — CLOPIDOGREL BISULFATE 75 MG PO TABS
75.0000 mg | ORAL_TABLET | Freq: Every day | ORAL | Status: DC
  Administered 2023-02-12 – 2023-02-13 (×2): 75 mg via ORAL
  Filled 2023-02-11 (×2): qty 1

## 2023-02-11 MED ORDER — INSULIN ASPART 100 UNIT/ML IJ SOLN
0.0000 [IU] | Freq: Three times a day (TID) | INTRAMUSCULAR | Status: DC
  Administered 2023-02-12: 1 [IU] via SUBCUTANEOUS
  Filled 2023-02-11: qty 1

## 2023-02-11 MED ORDER — CLOPIDOGREL BISULFATE 75 MG PO TABS
300.0000 mg | ORAL_TABLET | Freq: Once | ORAL | Status: AC
  Administered 2023-02-11: 300 mg via ORAL
  Filled 2023-02-11: qty 4

## 2023-02-11 NOTE — Progress Notes (Signed)
Neurology progress note  S: Patient was seen by teleneuro overnight for transient blurred vision and language difficulty. Patient's sx have resolved and she has no new complaints today.  Vascular surgery plans to stent her L carotid tomorrow (70% stenosis).  O:  Vitals:   02/11/23 1000 02/11/23 1028  BP: (!) 180/84 (!) 143/72  Pulse:    Resp:    Temp:    SpO2:     Exam  Vitals:   02/11/23 1028 02/11/23 1638  BP: (!) 143/72 (!) 154/73  Pulse:  65  Resp:  15  Temp:  98.4 F (36.9 C)  SpO2:  100%    Gen: patient lying in bed, NAD CV: extremities appear well-perfused Resp: normal WOB  Neurologic exam MS: alert, oriented x4, follows commands Speech: no dysarthria, no aphasia CN: PERRL, VFF, EOMI, sensation intact, face symmetric, hearing intact to voice Motor: 5/5 strength throughout Sensory: SILT Reflexes: 2+ symm with toes down bilat Coordination: FNF intact bilat Gait: deferred  NIHSS = 0  Data  CT head wo No acute intracranial hemorrhage or evidence of evolving large vessel territory infarct. ASPECT score is 10.  CTA H&N CT perfusion 1. No emergent large vessel occlusion or high-grade stenosis of the intracranial arteries. 2. 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 Less than 50% stenosis on the right. 3. Normal CT perfusion.  MRI brain wo Normal  CNS imaging personally reviewed; I agree with above interpretations  TTE - LVEF [id number], grade I diastolic dysfunction, no intracardiac clot, neg bubble  Stroke Labs     Component Value Date/Time   CHOL 143 02/11/2023 0439   CHOL 152 10/04/2019 0951   CHOL 191 07/10/2014 0235   TRIG 59 02/11/2023 0439   TRIG 105 07/10/2014 0235   HDL 60 02/11/2023 0439   HDL 60 10/04/2019 0951   HDL 88 (H) 07/10/2014 0235   CHOLHDL 2.4 02/11/2023 0439   VLDL 12 02/11/2023 0439   VLDL 21 07/10/2014 0235   LDLCALC 71 02/11/2023 0439   LDLCALC 76  10/04/2019 0951   LDLCALC 82 07/10/2014 0235   LABVLDL 16 10/04/2019 0951    Lab Results  Component Value Date/Time   HGBA1C 6.6 (H) 05/27/2022 06:46 AM   HGBA1C 6.3 03/09/2012 04:48 AM   A/P: This is a 72 year old woman with history of hypertension, hyperlipidemia, diabetes, prior strokes on aspirin and Plavix prior to admission who presented to the ED yesterday with sudden onset of blurred vision and language difficulty.  Her symptoms have now resolved and her MRI brain was negative for acute infarct.  CTA showed 70% stenosis of the left ICA.  Etiology of her symptoms is favored to be TIA.  Vascular surgery plans to take her to the OR tomorrow to stent her left carotid.  This timing is reasonable given that she did not have an acute infarct on MRI brain.  - TIA workup completed - Agree with plan for L carotid stenting tomorrow - No further specific inpatient neurologic workup indicated - Continue home aspirin and plavix for now, defer further recs on antiplatelets to vascular surgery - Goal normotension, avoid hypotension - Continue home atorvastatin 40mg  daily, patient is at LDL goal - Tele - PT/OT/SLP - Stroke education - Patient may f/u with PCP and vascular surgery post-discharge  Neurology to be available for questions prn going forward  Su Monks, MD [facility] [phone]  If 7pm- 7am, please page neurology on call as listed in  [facility].

## 2023-02-11 NOTE — Progress Notes (Signed)
PT Cancellation Note  Patient Details Name: Destiny Savage MRN: [id number] DOB: 1951-07-22   Cancelled Treatment:    Reason Eval/Treat Not Completed: Medical issues which prohibited therapy (Consult received and chart reviewed.  Patient noted to be scheduled for L carotid stenting next date; per physician, advises hold on therapy evaluations until procedure complete. Will continue to follow and initiate as appropriate.)   Cassara Nida H. Owens Shark, PT, DPT, NCS 02/11/23, 10:40 AM [phone]

## 2023-02-11 NOTE — Progress Notes (Addendum)
SLP Cancellation Note  Patient Details Name: Destiny Savage MRN: [id number] DOB: [birth year]   Cancelled treatment:       Reason Eval/Treat Not Completed:  (chart reviewed)  Per chart notes, pt noted to be scheduled for L carotid stenting next date; per Vascular MD note today, "Vascular surgery will take the patient to the vascular lab on Wednesday, 02/12/2023 for an endovascular left carotid stent placement with possible intervention.", and also advised to hold on therapy evaluations until procedure complete. Will continue to follow and initiate evaluation as appropriate next 2-3 days.      Orinda Kenner, MS, CCC-SLP Speech Language Pathologist Rehab Services; [place] [phone] (ascom) Tishawn Friedhoff 02/11/2023, 1:21 PM

## 2023-02-11 NOTE — Progress Notes (Signed)
OT Cancellation Note  Patient Details Name: Destiny Savage MRN: [id number] DOB: 1951/05/04   Cancelled Treatment:    Reason Eval/Treat Not Completed: Medical issues which prohibited therapy. OT order received and chart reviewed. OT arrives to room and pt is blinking rapidly and reports not feeling well. She is observed to be attempting to pour seasoning into coffee. RN student arrives to take BP with results of 200/89 which is well above parameters for activity. RN notified. OT will re-attempt when pt is able to safely participate.   Darleen Crocker, MS, OTR/L , CBIS ascom [phone]  02/11/23, 9:56 AM

## 2023-02-11 NOTE — Consult Note (Signed)
Hospital Consult    Reason for Consult:  TIA Requesting Physician:  Dr Eugenie Norrie MD MRN #:  [id number]  History of Present Illness: This is a 72 y.o.African-American  female with medical history significant for type 2 diabetes mellitus, hypertension, dyslipidemia, GERD, asthma, TIA and CVA, who presented to the emergency room with acute onset of expressive dysphagia and slurred speech with associated left-sided numbness that started around 5 PM Yesterday.  She was having "funny feeling" in her head with associated mild bilateral facial numbness.  She admitted to difficulty with ambulation because she felt dizzy.  She denied any chest pain or palpitations.  No tinnitus or vertigo.  No witnessed seizures.  No urinary or stool incontinence.  No nausea or vomiting or abdominal pain.  No cough or wheezing or dyspnea.  No dysuria, oliguria or hematuria or flank pain.  She endorses taking her aspirin and Plavix regularly as well as the rest of her medications.   On exam this morning she remains neuro intact. Equal strength bilaterally. No facial weakness. Pupils equal and reactive. Positive reflexes and normal proprioception. All cranial nerves intact.   Past Medical History:  Diagnosis Date   Arthritis    Asthma    Diabetes mellitus    GERD (gastroesophageal reflux disease)    Hyperlipidemia    Hypertension    Mini stroke    Stroke [facility])     Past Surgical History:  Procedure Laterality Date   ABDOMINAL HYSTERECTOMY     BREAST EXCISIONAL BIOPSY Left 1980's   benign   CARDIAC CATHETERIZATION     CHOLECYSTECTOMY      Allergies  Allergen Reactions   Latex Hives and Rash    Other reaction(s): Other (See Comments)   Lidocaine Hives   Phenobarbital-Belladonna Alk     Other reaction(s): Hives & Rash   Phenobarbital-Belladonna Alk Hives and Rash    Prior to Admission medications   Medication Sig Start Date End Date Taking? Authorizing Provider  acetaminophen (TYLENOL) 500 MG tablet  Take 500-1,000 mg by mouth every 8 (eight) hours as needed.   Yes [provider]  albuterol (VENTOLIN HFA) 108 (90 Base) MCG/ACT inhaler Inhale 1 puff into the lungs every 6 (six) hours as needed for wheezing.   Yes [provider]  aspirin 81 MG tablet Take 1 tablet (81 mg total) by mouth daily. 01/11/17  Yes Sudini, Alveta Heimlich, MD  atorvastatin (LIPITOR) 40 MG tablet Take 1 tablet (40 mg total) by mouth daily. 06/05/22  Yes Minna Merritts, MD  carvedilol (COREG) 6.25 MG tablet Take 1 tablet (6.25 mg total) by mouth 2 (two) times daily with a meal. 03/21/20  Yes Gollan, Kathlene November, MD  cholecalciferol (VITAMIN D3) 25 MCG (1000 UNIT) tablet Take 1,000 Units by mouth daily.   Yes [provider]  clopidogrel (PLAVIX) 75 MG tablet Take 1 tablet (75 mg total) by mouth daily. 03/21/20  Yes Minna Merritts, MD  furosemide (LASIX) 20 MG tablet Take 1 tablet (20 mg total) by mouth every Monday, Wednesday, and Friday. 06/05/22  Yes Gollan, Kathlene November, MD  metFORMIN (GLUCOPHAGE-XR) 500 MG 24 hr tablet Take 500 mg by mouth in the morning and 1000 mg at hight. 11/23/19  Yes [provider]  Multiple Vitamin (MULTIVITAMIN WITH MINERALS) TABS tablet Take 1 tablet by mouth daily.   Yes [provider]  polyethylene glycol (MIRALAX) 17 g packet Take 17 g by mouth daily. 09/09/22  Yes Sharen Hones, MD  potassium chloride (  KLOR-CON) 10 MEQ tablet Take 1 tablet (10 mEq total) by mouth every Monday, Wednesday, and Friday. 06/05/22  Yes Gollan, Kathlene November, MD  gabapentin (NEURONTIN) 300 MG capsule Take 300 mg by mouth at bedtime. Patient not taking: Reported on 02/10/2023 01/27/23   [provider]  hydrOXYzine (ATARAX) 25 MG tablet Take 25 mg by mouth daily. Patient not taking: Reported on 02/10/2023    [provider]  Lactulose 20 GM/30ML SOLN Take 30 mLs (20 g total) by mouth daily as needed (constipation). Patient not taking: Reported on 02/10/2023 09/09/22   Sharen Hones, MD  meloxicam (MOBIC) 7.5 MG tablet Take 1 tablet (7.5 mg total) by mouth daily. Patient not taking: Reported on 02/10/2023 02/04/23 02/04/24  Cuthriell, Charline Bills, PA-C  methocarbamol (ROBAXIN) 500 MG tablet Take 500 mg by mouth every 8 (eight) hours as needed. Patient not taking: Reported on 02/10/2023 01/27/23   [provider]  oxyCODONE-acetaminophen (PERCOCET) 5-325 MG tablet Take 1 tablet by mouth every 4 (four) hours as needed for severe pain. Patient not taking: Reported on 02/10/2023 01/25/23   Harvest Dark, MD  pantoprazole (PROTONIX) 40 MG tablet Take 40 mg by mouth daily. Patient not taking: Reported on 02/10/2023 04/18/22   [provider]  predniSONE (DELTASONE) 50 MG tablet Take 1 tablet (50 mg total) by mouth daily with breakfast. Patient not taking: Reported on 02/10/2023 02/04/23   Cuthriell, Charline Bills, PA-C  senna-docusate (SENOKOT-S) 8.6-50 MG tablet Take 1 tablet by mouth 2 (two) times daily as needed for mild constipation. Patient not taking: Reported on 02/10/2023 09/09/22   Sharen Hones, MD    Social History   Socioeconomic History   Marital status: Married    Spouse name: Not on file   Number of children: 3   Years of education: Not on file   Highest education level: Some college, no degree  Occupational History   Not on file  Tobacco Use   Smoking status: Never   Smokeless tobacco: Never  Vaping Use   Vaping Use: Never used  Substance and Sexual Activity   Alcohol use: No   Drug use: No   Sexual activity: Yes    Partners: Male    Birth control/protection: Surgical  Other Topics Concern   Not on file  Social History Narrative   Married lives with spouse   3 children   Left handed   1 year of college   Drinks coffee, tea and soda sometimes   Social Determinants of Health   Financial Resource Strain: Not on file  Food Insecurity: No Food Insecurity (02/11/2023)   Hunger Vital Sign    Worried About Running Out of Food in the  Last Year: Never true    Ran Out of Food in the Last Year: Never true  Transportation Needs: No Transportation Needs (02/11/2023)   PRAPARE - Hydrologist (Medical): No    Lack of Transportation (Non-Medical): No  Physical Activity: Not on file  Stress: Not on file  Social Connections: Not on file  Intimate Partner Violence: Not At Risk (02/11/2023)   Humiliation, Afraid, Rape, and Kick questionnaire    Fear of Current or Ex-Partner: No    Emotionally Abused: No    Physically Abused: No    Sexually Abused: No     Family History  Problem Relation Age of Onset   Heart Problems Father    Hypertension Other    CAD Other    Breast cancer  Neg Hx     ROS: Otherwise negative unless mentioned in HPI  Physical Examination  Vitals:   02/11/23 0415 02/11/23 0726  BP: (!) 174/75 (!) 157/75  Pulse: 64 65  Resp: 17 18  Temp: 100.2 F (37.9 C)   SpO2: 98% 100%   Body mass index is 31.92 kg/m.  General:  WDWN in NAD Gait: Not observed HENT: WNL, normocephalic Pulmonary: normal non-labored breathing, without Rales, rhonchi,  wheezing Cardiac: regular, without  Murmurs, rubs or gallops; without carotid bruits Abdomen: Positive Bowel sounds, soft, NT/ND, no masses Skin: without rashes Vascular Exam/Pulses: Positive palpable radial, and Pedal pulses  Extremities: without ischemic changes, without Gangrene , without cellulitis; without open wounds;  Musculoskeletal: no muscle wasting or atrophy  Neurologic: A&O X 3;  No focal weakness or paresthesias are detected; speech is fluent/normal Psychiatric:  The pt has Normal affect. Lymph:  Unremarkable  CBC    Component Value Date/Time   WBC 9.6 02/11/2023 0439   RBC 3.66 (L) 02/11/2023 0439   HGB 10.9 (L) 02/11/2023 0439   HGB 11.8 10/04/2019 0951   HCT 34.0 (L) 02/11/2023 0439   HCT 35.2 10/04/2019 0951   PLT 242 02/11/2023 0439   PLT 274 10/04/2019 0951   MCV 92.9 02/11/2023 0439   MCV 88  10/04/2019 0951   MCV 90 02/19/2015 1647   MCH 29.8 02/11/2023 0439   MCHC 32.1 02/11/2023 0439   RDW 13.2 02/11/2023 0439   RDW 12.1 10/04/2019 0951   RDW 13.9 02/19/2015 1647   LYMPHSABS 3.0 02/10/2023 1803   LYMPHSABS 0.9 (L) 02/19/2015 1647   MONOABS 0.5 02/10/2023 1803   MONOABS 0.6 02/19/2015 1647   EOSABS 0.6 (H) 02/10/2023 1803   EOSABS 0.2 02/19/2015 1647   BASOSABS 0.0 02/10/2023 1803   BASOSABS 0.2 (H) 02/19/2015 1647    BMET    Component Value Date/Time   NA 134 (L) 02/11/2023 0439   NA 141 12/29/2015 0942   NA 139 02/19/2015 1647   K 3.8 02/11/2023 0439   K 4.4 02/19/2015 1647   CL 101 02/11/2023 0439   CL 101 02/19/2015 1647   CO2 28 02/11/2023 0439   CO2 28 02/19/2015 1647   GLUCOSE 158 (H) 02/11/2023 0439   GLUCOSE 141 (H) 02/19/2015 1647   BUN 9 02/11/2023 0439   BUN 9 12/29/2015 0942   BUN 15 02/19/2015 1647   CREATININE 0.77 02/11/2023 0439   CREATININE 0.91 02/19/2015 1647   CALCIUM 8.9 02/11/2023 0439   CALCIUM 10.1 02/19/2015 1647   GFRNONAA >60 02/11/2023 0439   GFRNONAA >60 02/19/2015 1647   GFRAA >60 01/07/2019 1115   GFRAA >60 02/19/2015 1647    COAGS: Lab Results  Component Value Date   INR 1.1 02/10/2023   INR 1.0 05/18/2022   INR 0.94 01/07/2019     Non-Invasive Vascular Imaging:   EXAM: CT ANGIOGRAPHY HEAD AND NECK  RIGHT CAROTID SYSTEM: No dissection, occlusion or aneurysm. There is mixed density atherosclerosis extending into the proximal ICA, resulting in less than 50% stenosis.   LEFT CAROTID SYSTEM: No dissection, occlusion or aneurysm. There is calcified atherosclerosis extending into the proximal ICA, resulting in approximately 70% stenosis.   VERTEBRAL ARTERIES: Left dominant configuration. Both origins are clearly patent. There is no dissection, occlusion or flow-limiting stenosis to the skull base (V1-V3 segments).  EXAM: BILATERAL CAROTID DUPLEX ULTRASOUND  RIGHT CAROTID ARTERY: No significant  calcifications of the right common carotid artery. Intermediate waveform maintained. Moderate heterogeneous and partially  calcified plaque at the right carotid bifurcation. No significant lumen shadowing. Low resistance waveform of the right ICA. No significant tortuosity.   RIGHT VERTEBRAL ARTERY: Antegrade flow with low resistance waveform.   LEFT CAROTID ARTERY: No significant calcifications of the left common carotid artery. Intermediate waveform maintained. Moderate heterogeneous and partially calcified plaque at the left carotid bifurcation. No significant lumen shadowing. Low resistance waveform of the left ICA. No significant tortuosity.   LEFT VERTEBRAL ARTERY:  Antegrade flow with low resistance waveform.   IMPRESSION: Color duplex indicates moderate heterogeneous and calcified plaque, with no hemodynamically significant stenosis by duplex criteria in the extracranial cerebrovascular circulation.  Statin:  Yes.   Beta Blocker:  Yes.   Aspirin:  Yes.   ACEI:  No. ARB:  No. CCB use:  No Other antiplatelets/anticoagulants:  No.    ASSESSMENT/PLAN: This is a 72 y.o. female who presents to [facility] emergency department with signs and symptoms of transient ischemic attack/CVA.  Symptoms started at 5 PM yesterday with her inability to make full sentences/slurred speech and bilateral facial numbness.  Plan: Vascular surgery will take the patient to the vascular lab on Wednesday, 02/12/2023 for an endovascular left carotid stent placement with possible intervention.  I discussed in detail with the patient the procedure, benefits, risks, complications.  She verbalizes her understanding.  I answered all the patient's questions this morning.  Patient will be given a Plavix loading dose of 300 mg today.  She will also get her 81 mg of aspirin today.  Patient will be made n.p.o. after midnight for procedure tomorrow.   -I discussed the plan in detail with Dr. Hortencia Pilar MD and he  agrees with the plan.   Drema Pry [facility] 02/11/2023 7:40 AM

## 2023-02-11 NOTE — Progress Notes (Signed)
PROGRESS NOTE   HPI was taken from Dr. Sidney Ace: Destiny Savage is a 72 y.o. African-American female with medical history significant for type 2 diabetes mellitus, hypertension, dyslipidemia, GERD, asthma, TIA and CVA, who presented to the emergency room with acute onset of expressive dysphagia and slurred speech with associated left-sided numbness that started around 5 PM today.  She was having "funny feeling" in her head with associated mild left facial numbness.  She admitted to difficulty with ambulation.  She denied any chest pain or palpitations.  No tinnitus or vertigo.  No witnessed seizures.  No urinary or stool incontinence.  No nausea or vomiting or abdominal pain.  No cough or wheezing or dyspnea.  No dysuria, oliguria or hematuria or flank pain.  She takes her aspirin and Plavix regularly as well as the rest of her medications.   ED Course: When she came to the ER,, BP was 188/82 with otherwise normal vital signs.  Labs revealed hypokalemia of 3.3 with otherwise unremarkable CMP.  CBC showed hemoglobin 11.4 hematocrit 36.1 better than previous levels in October last year.  UA is pending.  Alcohol level is less than 10.   EKG as reviewed by me : EKG showed normal sinus rhythm with a rate of 68 with T wave inversion in V1 and flattened T waves in V2 and V3. Imaging: Noncontrasted CT scan revealed no acute intracranial normalities. Head and neck CTA revealed the following: 1. No emergent large vessel occlusion or high-grade stenosis of the intracranial arteries. 2. 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 Less than 50% stenosis on the right. 3. Normal CT perfusion.   The patient was given 40 mill Cabbell p.o. potassium chloride.  Vascular surgery was contacted.  She has a teleneurology consult and she was not felt to be candidate for thrombolytic therapy.  She will be admitted to a medical telemetry observation bed for  further evaluation and management.   Destiny Savage  [id number] DOB: 09-30-1951 DOA: 02/10/2023 PCP: Destiny Billet, MD    Assessment & Plan:   Principal Problem:   TIA (transient ischemic attack) Active Problems:   Hypokalemia   Internal carotid artery stenosis, left   Essential hypertension   Dyslipidemia   GERD without esophagitis   Peripheral neuropathy  Assessment and Plan: TIA: w/ left sided numbness, expressive dysphasia & dysarthria. Continue w/ neuro checks. Continue on aspirin, plavix. MRI brain was WNL. Echo ordered. PT/OT consulted. Neuro consulted    Left internal carotid artery stenosis: continue on aspirin, plavix. Will go for left carotid stent placement tomorrow as per vasc surg. Vasc surg recs apprec    Hypokalemia: WNL today    HLD: continue on statin    HTN: continue on coreg, lasix   GERD: continue on PPI    Peripheral neuropathy: continue on gabapentin       DVT prophylaxis: lovenox  Code Status: full  Family Communication: discussed pt's care w/ pt's daughter at bedside and answered her questions Disposition Plan: depends on PT/OT recs   Level of care: Telemetry Medical  Status is: Observation The patient remains OBS appropriate and will d/c before 2 midnights.    Consultants:  Vasc surg Neuro   Procedures:  Antimicrobials:    Subjective: Pt c/o feeling hot   Objective: Vitals:   02/10/23 2159 02/11/23 0000 02/11/23 0415 02/11/23 0726  BP: (!) 181/87 (!) 162/80 (!) 174/75 (!) 157/75  Pulse: 63 64 64 65  Resp:  18 18 17 18   Temp: 98.7 F (37.1 C) 98 F (36.7 C) 100.2 F (37.9 C) 98 F (36.7 C)  TempSrc:    Oral  SpO2: 100% 100% 98% 100%  Weight:      Height:        Intake/Output Summary (Last 24 hours) at 02/11/2023 0815 Last data filed at 02/11/2023 0600 Gross per 24 hour  Intake 71.06 ml  Output --  Net 71.06 ml   Filed Weights   02/10/23 1939  Weight: 89.7 kg    Examination:  General exam: Appears calm  and comfortable  Respiratory system: Clear to auscultation. Respiratory effort normal. Cardiovascular system: S1 & S2+. No rubs, gallops or clicks.  Gastrointestinal system: Abdomen is nondistended, soft and nontender.Normal bowel sounds heard. Central nervous system: Alert and awake. Moves all extremities  Psychiatry: Judgement and insight appear normal. Mood & affect appropriate.     Data Reviewed: I have personally reviewed following labs and imaging studies  CBC: Recent Labs  Lab 02/10/23 1803 02/11/23 0439  WBC 9.2 9.6  NEUTROABS 5.1  --   HGB 11.4* 10.9*  HCT 36.1 34.0*  MCV 94.5 92.9  PLT 258 [id number]   Basic Metabolic Panel: Recent Labs  Lab 02/10/23 1803 02/11/23 0439  NA 137 134*  K 3.3* 3.8  CL 101 101  CO2 25 28  GLUCOSE 133* 158*  BUN 9 9  CREATININE 0.84 0.77  CALCIUM 9.4 8.9   GFR: Estimated Creatinine Clearance: 72.8 mL/min (by C-G formula based on SCr of 0.77 mg/dL). Liver Function Tests: Recent Labs  Lab 02/10/23 1803  AST 22  ALT 11  ALKPHOS 63  BILITOT 0.6  PROT 7.0  ALBUMIN 3.9   No results for input(s): "LIPASE", "AMYLASE" in the last 168 hours. No results for input(s): "AMMONIA" in the last 168 hours. Coagulation Profile: Recent Labs  Lab 02/10/23 1803  INR 1.1   Cardiac Enzymes: No results for input(s): "CKTOTAL", "CKMB", "CKMBINDEX", "TROPONINI" in the last 168 hours. BNP (last 3 results) No results for input(s): "PROBNP" in the last 8760 hours. HbA1C: No results for input(s): "HGBA1C" in the last 72 hours. CBG: Recent Labs  Lab 02/10/23 1801  GLUCAP 152*   Lipid Profile: Recent Labs    02/11/23 0439  CHOL 143  HDL 60  LDLCALC 71  TRIG 59  CHOLHDL 2.4   Thyroid Function Tests: No results for input(s): "TSH", "T4TOTAL", "FREET4", "T3FREE", "THYROIDAB" in the last 72 hours. Anemia Panel: No results for input(s): "VITAMINB12", "FOLATE", "FERRITIN", "TIBC", "IRON", "RETICCTPCT" in the last 72 hours. Sepsis Labs: No  results for input(s): "PROCALCITON", "LATICACIDVEN" in the last 168 hours.  No results found for this or any previous visit (from the past 240 hour(s)).       Radiology Studies: MR BRAIN WO CONTRAST  Result Date: 02/11/2023 CLINICAL DATA:  Acute neurologic deficit EXAM: MRI HEAD WITHOUT CONTRAST TECHNIQUE: Multiplanar, multiecho pulse sequences of the brain and surrounding structures were obtained without intravenous contrast. COMPARISON:  07/28/2019 FINDINGS: Brain: No acute infarct, mass effect or extra-axial collection. No chronic microhemorrhage or siderosis. Normal white matter signal, parenchymal volume and CSF spaces. The midline structures are normal. Vascular: Normal flow voids. Skull and upper cervical spine: Normal marrow signal. Sinuses/Orbits: Negative. Other: None. IMPRESSION: Normal MRI of the brain. Electronically Signed   By: Ulyses Jarred M.D.   On: 02/11/2023 00:05   CT ANGIO HEAD W OR WO CONTRAST  Result Date: 02/10/2023 CLINICAL DATA:  Blurry vision EXAM:  CT ANGIOGRAPHY HEAD AND NECK CT PERFUSION BRAIN TECHNIQUE: Multidetector CT imaging of the head and neck was performed using the standard protocol during bolus administration of intravenous contrast. Multiplanar CT image reconstructions and MIPs were obtained to evaluate the vascular anatomy. Carotid stenosis measurements (when applicable) are obtained utilizing NASCET criteria, using the distal internal carotid diameter as the denominator. Multiphase CT imaging of the brain was performed following IV bolus contrast injection. Subsequent parametric perfusion maps were calculated using RAPID software. RADIATION DOSE REDUCTION: This exam was performed according to the departmental dose-optimization program which includes automated exposure control, adjustment of the mA and/or kV according to patient size and/or use of iterative reconstruction technique. CONTRAST:  163mL OMNIPAQUE IOHEXOL 350 MG/ML SOLN COMPARISON:  None Available.  FINDINGS: CTA NECK FINDINGS SKELETON: There is no bony spinal canal stenosis. No lytic or blastic lesion. OTHER NECK: Normal pharynx, larynx and major salivary glands. No cervical lymphadenopathy. Unremarkable thyroid gland. UPPER CHEST: No pneumothorax or pleural effusion. No nodules or masses. AORTIC ARCH: There is calcific atherosclerosis of the aortic arch. There is no aneurysm, dissection or hemodynamically significant stenosis of the visualized portion of the aorta. Conventional 3 vessel aortic branching pattern. The visualized proximal subclavian arteries are widely patent. RIGHT CAROTID SYSTEM: No dissection, occlusion or aneurysm. There is mixed density atherosclerosis extending into the proximal ICA, resulting in less than 50% stenosis. LEFT CAROTID SYSTEM: No dissection, occlusion or aneurysm. There is calcified atherosclerosis extending into the proximal ICA, resulting in approximately 70% stenosis. VERTEBRAL ARTERIES: Left dominant configuration. Both origins are clearly patent. There is no dissection, occlusion or flow-limiting stenosis to the skull base (V1-V3 segments). CTA HEAD FINDINGS POSTERIOR CIRCULATION: --Vertebral arteries: Normal V4 segments. --Inferior cerebellar arteries: Normal. --Basilar artery: Normal. --Superior cerebellar arteries: Normal. --Posterior cerebral arteries (PCA): Normal. ANTERIOR CIRCULATION: --Intracranial internal carotid arteries: Normal. --Anterior cerebral arteries (ACA): Normal. Both A1 segments are present. Patent anterior communicating artery (a-comm). --Middle cerebral arteries (MCA): Normal. VENOUS SINUSES: As permitted by contrast timing, patent. ANATOMIC VARIANTS: Fetal origins of both posterior cerebral arteries. Review of the MIP images confirms the above findings. CT Brain Perfusion Findings: ASPECTS: 10 CBF (<30%) Volume: 42mL Perfusion (Tmax>6.0s) volume: 69mL Mismatch Volume: 18mL Infarction Location:None IMPRESSION: 1. No emergent large vessel occlusion or  high-grade stenosis of the intracranial arteries. 2. 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 Less than 50% stenosis on the right. 3. Normal CT perfusion. Electronically Signed   By: Ulyses Jarred M.D.   On: 02/10/2023 19:38   CT ANGIO NECK W OR WO CONTRAST  Result Date: 02/10/2023 CLINICAL DATA:  Blurry vision EXAM: CT ANGIOGRAPHY HEAD AND NECK CT PERFUSION BRAIN TECHNIQUE: Multidetector CT imaging of the head and neck was performed using the standard protocol during bolus administration of intravenous contrast. Multiplanar CT image reconstructions and MIPs were obtained to evaluate the vascular anatomy. Carotid stenosis measurements (when applicable) are obtained utilizing NASCET criteria, using the distal internal carotid diameter as the denominator. Multiphase CT imaging of the brain was performed following IV bolus contrast injection. Subsequent parametric perfusion maps were calculated using RAPID software. RADIATION DOSE REDUCTION: This exam was performed according to the departmental dose-optimization program which includes automated exposure control, adjustment of the mA and/or kV according to patient size and/or use of iterative reconstruction technique. CONTRAST:  174mL OMNIPAQUE IOHEXOL 350 MG/ML SOLN COMPARISON:  None Available. FINDINGS: CTA NECK FINDINGS SKELETON: There is no bony spinal canal stenosis. No lytic or blastic  lesion. OTHER NECK: Normal pharynx, larynx and major salivary glands. No cervical lymphadenopathy. Unremarkable thyroid gland. UPPER CHEST: No pneumothorax or pleural effusion. No nodules or masses. AORTIC ARCH: There is calcific atherosclerosis of the aortic arch. There is no aneurysm, dissection or hemodynamically significant stenosis of the visualized portion of the aorta. Conventional 3 vessel aortic branching pattern. The visualized proximal subclavian arteries are widely patent. RIGHT CAROTID SYSTEM: No  dissection, occlusion or aneurysm. There is mixed density atherosclerosis extending into the proximal ICA, resulting in less than 50% stenosis. LEFT CAROTID SYSTEM: No dissection, occlusion or aneurysm. There is calcified atherosclerosis extending into the proximal ICA, resulting in approximately 70% stenosis. VERTEBRAL ARTERIES: Left dominant configuration. Both origins are clearly patent. There is no dissection, occlusion or flow-limiting stenosis to the skull base (V1-V3 segments). CTA HEAD FINDINGS POSTERIOR CIRCULATION: --Vertebral arteries: Normal V4 segments. --Inferior cerebellar arteries: Normal. --Basilar artery: Normal. --Superior cerebellar arteries: Normal. --Posterior cerebral arteries (PCA): Normal. ANTERIOR CIRCULATION: --Intracranial internal carotid arteries: Normal. --Anterior cerebral arteries (ACA): Normal. Both A1 segments are present. Patent anterior communicating artery (a-comm). --Middle cerebral arteries (MCA): Normal. VENOUS SINUSES: As permitted by contrast timing, patent. ANATOMIC VARIANTS: Fetal origins of both posterior cerebral arteries. Review of the MIP images confirms the above findings. CT Brain Perfusion Findings: ASPECTS: 10 CBF (<30%) Volume: 27mL Perfusion (Tmax>6.0s) volume: 36mL Mismatch Volume: 96mL Infarction Location:None IMPRESSION: 1. No emergent large vessel occlusion or high-grade stenosis of the intracranial arteries. 2. 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 Less than 50% stenosis on the right. 3. Normal CT perfusion. Electronically Signed   By: Ulyses Jarred M.D.   On: 02/10/2023 19:38   CT CEREBRAL PERFUSION W CONTRAST  Result Date: 02/10/2023 CLINICAL DATA:  Blurry vision EXAM: CT ANGIOGRAPHY HEAD AND NECK CT PERFUSION BRAIN TECHNIQUE: Multidetector CT imaging of the head and neck was performed using the standard protocol during bolus administration of intravenous contrast. Multiplanar CT  image reconstructions and MIPs were obtained to evaluate the vascular anatomy. Carotid stenosis measurements (when applicable) are obtained utilizing NASCET criteria, using the distal internal carotid diameter as the denominator. Multiphase CT imaging of the brain was performed following IV bolus contrast injection. Subsequent parametric perfusion maps were calculated using RAPID software. RADIATION DOSE REDUCTION: This exam was performed according to the departmental dose-optimization program which includes automated exposure control, adjustment of the mA and/or kV according to patient size and/or use of iterative reconstruction technique. CONTRAST:  167mL OMNIPAQUE IOHEXOL 350 MG/ML SOLN COMPARISON:  None Available. FINDINGS: CTA NECK FINDINGS SKELETON: There is no bony spinal canal stenosis. No lytic or blastic lesion. OTHER NECK: Normal pharynx, larynx and major salivary glands. No cervical lymphadenopathy. Unremarkable thyroid gland. UPPER CHEST: No pneumothorax or pleural effusion. No nodules or masses. AORTIC ARCH: There is calcific atherosclerosis of the aortic arch. There is no aneurysm, dissection or hemodynamically significant stenosis of the visualized portion of the aorta. Conventional 3 vessel aortic branching pattern. The visualized proximal subclavian arteries are widely patent. RIGHT CAROTID SYSTEM: No dissection, occlusion or aneurysm. There is mixed density atherosclerosis extending into the proximal ICA, resulting in less than 50% stenosis. LEFT CAROTID SYSTEM: No dissection, occlusion or aneurysm. There is calcified atherosclerosis extending into the proximal ICA, resulting in approximately 70% stenosis. VERTEBRAL ARTERIES: Left dominant configuration. Both origins are clearly patent. There is no dissection, occlusion or flow-limiting stenosis to the skull base (V1-V3 segments). CTA HEAD FINDINGS POSTERIOR CIRCULATION: --Vertebral arteries: Normal V4 segments. --  Inferior cerebellar arteries:  Normal. --Basilar artery: Normal. --Superior cerebellar arteries: Normal. --Posterior cerebral arteries (PCA): Normal. ANTERIOR CIRCULATION: --Intracranial internal carotid arteries: Normal. --Anterior cerebral arteries (ACA): Normal. Both A1 segments are present. Patent anterior communicating artery (a-comm). --Middle cerebral arteries (MCA): Normal. VENOUS SINUSES: As permitted by contrast timing, patent. ANATOMIC VARIANTS: Fetal origins of both posterior cerebral arteries. Review of the MIP images confirms the above findings. CT Brain Perfusion Findings: ASPECTS: 10 CBF (<30%) Volume: 109mL Perfusion (Tmax>6.0s) volume: 74mL Mismatch Volume: 95mL Infarction Location:None IMPRESSION: 1. No emergent large vessel occlusion or high-grade stenosis of the intracranial arteries. 2. 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 Less than 50% stenosis on the right. 3. Normal CT perfusion. Electronically Signed   By: Ulyses Jarred M.D.   On: 02/10/2023 19:38   CT HEAD CODE STROKE WO CONTRAST  Result Date: 02/10/2023 CLINICAL DATA:  Code stroke. Neuro deficit, acute, stroke suspected. EXAM: CT HEAD WITHOUT CONTRAST TECHNIQUE: Contiguous axial images were obtained from the base of the skull through the vertex without intravenous contrast. RADIATION DOSE REDUCTION: This exam was performed according to the departmental dose-optimization program which includes automated exposure control, adjustment of the mA and/or kV according to patient size and/or use of iterative reconstruction technique. COMPARISON:  Head CT 05/18/2022. FINDINGS: Brain: No acute hemorrhage, mass effect or midline shift. Gray-white differentiation is preserved. No hydrocephalus. No extra-axial collection. Basilar cisterns are patent. Vascular: No hyperdense vessel or unexpected calcification. Skull: No calvarial fracture or suspicious bone lesion. Skull base is unremarkable. Sinuses/Orbits:  Unremarkable. Other: None. ASPECTS ([place] Stroke Program Early CT Score) - Ganglionic level infarction (caudate, lentiform nuclei, internal capsule, insula, M1-M3 cortex): 7 - Supraganglionic infarction (M4-M6 cortex): 3 Total score (0-10 with 10 being normal): 10 IMPRESSION: No acute intracranial hemorrhage or evidence of evolving large vessel territory infarct. ASPECT score is 10. Code stroke imaging results were communicated on 02/10/2023 at 6:18 pm to provider Dr. Starleen Blue via telephone, who verbally acknowledged these results. Electronically Signed   By: Emmit Alexanders M.D.   On: 02/10/2023 18:18        Scheduled Meds:   stroke: early stages of recovery book   Does not apply Once   aspirin  81 mg Oral Daily   atorvastatin  40 mg Oral Daily   carvedilol  6.25 mg Oral BID WC   cholecalciferol  1,000 Units Oral Daily   clopidogrel  75 mg Oral Daily   enoxaparin (LOVENOX) injection  40 mg Subcutaneous Q24H   [START ON 02/12/2023] furosemide  20 mg Oral Q M,W,F   multivitamin with minerals  1 tablet Oral Daily   [START ON 02/12/2023] potassium chloride SA  10 mEq Oral Q M,W,F   sodium chloride flush  3 mL Intravenous Once   Continuous Infusions:  sodium chloride 100 mL/hr at 02/10/23 2229     LOS: 0 days    Time spent: 35 mins    Wyvonnia Dusky, MD [facility] Pager [phone]  If 7PM-7AM, please contact night-coverage [email or web address] 02/11/2023, 8:15 AM

## 2023-02-11 NOTE — Progress Notes (Signed)
*  PRELIMINARY RESULTS* Echocardiogram 2D Echocardiogram has been performed.  Sherrie Sport 02/11/2023, 8:42 AM

## 2023-02-11 NOTE — H&P (View-Only) (Signed)
Hospital Consult    Reason for Consult:  TIA Requesting Physician:  Dr Eugenie Norrie MD MRN #:  [id number]  History of Present Illness: This is a 72 y.o.African-American  female with medical history significant for type 2 diabetes mellitus, hypertension, dyslipidemia, GERD, asthma, TIA and CVA, who presented to the emergency room with acute onset of expressive dysphagia and slurred speech with associated left-sided numbness that started around 5 PM Yesterday.  She was having "funny feeling" in her head with associated mild bilateral facial numbness.  She admitted to difficulty with ambulation because she felt dizzy.  She denied any chest pain or palpitations.  No tinnitus or vertigo.  No witnessed seizures.  No urinary or stool incontinence.  No nausea or vomiting or abdominal pain.  No cough or wheezing or dyspnea.  No dysuria, oliguria or hematuria or flank pain.  She endorses taking her aspirin and Plavix regularly as well as the rest of her medications.   On exam this morning she remains neuro intact. Equal strength bilaterally. No facial weakness. Pupils equal and reactive. Positive reflexes and normal proprioception. All cranial nerves intact.   Past Medical History:  Diagnosis Date   Arthritis    Asthma    Diabetes mellitus    GERD (gastroesophageal reflux disease)    Hyperlipidemia    Hypertension    Mini stroke    Stroke [facility])     Past Surgical History:  Procedure Laterality Date   ABDOMINAL HYSTERECTOMY     BREAST EXCISIONAL BIOPSY Left 1980's   benign   CARDIAC CATHETERIZATION     CHOLECYSTECTOMY      Allergies  Allergen Reactions   Latex Hives and Rash    Other reaction(s): Other (See Comments)   Lidocaine Hives   Phenobarbital-Belladonna Alk     Other reaction(s): Hives & Rash   Phenobarbital-Belladonna Alk Hives and Rash    Prior to Admission medications   Medication Sig Start Date End Date Taking? Authorizing Provider  acetaminophen (TYLENOL) 500 MG tablet  Take 500-1,000 mg by mouth every 8 (eight) hours as needed.   Yes [provider]  albuterol (VENTOLIN HFA) 108 (90 Base) MCG/ACT inhaler Inhale 1 puff into the lungs every 6 (six) hours as needed for wheezing.   Yes [provider]  aspirin 81 MG tablet Take 1 tablet (81 mg total) by mouth daily. 01/11/17  Yes Sudini, Alveta Heimlich, MD  atorvastatin (LIPITOR) 40 MG tablet Take 1 tablet (40 mg total) by mouth daily. 06/05/22  Yes Minna Merritts, MD  carvedilol (COREG) 6.25 MG tablet Take 1 tablet (6.25 mg total) by mouth 2 (two) times daily with a meal. 03/21/20  Yes Gollan, Kathlene November, MD  cholecalciferol (VITAMIN D3) 25 MCG (1000 UNIT) tablet Take 1,000 Units by mouth daily.   Yes [provider]  clopidogrel (PLAVIX) 75 MG tablet Take 1 tablet (75 mg total) by mouth daily. 03/21/20  Yes Minna Merritts, MD  furosemide (LASIX) 20 MG tablet Take 1 tablet (20 mg total) by mouth every Monday, Wednesday, and Friday. 06/05/22  Yes Gollan, Kathlene November, MD  metFORMIN (GLUCOPHAGE-XR) 500 MG 24 hr tablet Take 500 mg by mouth in the morning and 1000 mg at hight. 11/23/19  Yes [provider]  Multiple Vitamin (MULTIVITAMIN WITH MINERALS) TABS tablet Take 1 tablet by mouth daily.   Yes [provider]  polyethylene glycol (MIRALAX) 17 g packet Take 17 g by mouth daily. 09/09/22  Yes Sharen Hones, MD  potassium chloride (  KLOR-CON) 10 MEQ tablet Take 1 tablet (10 mEq total) by mouth every Monday, Wednesday, and Friday. 06/05/22  Yes Gollan, Kathlene November, MD  gabapentin (NEURONTIN) 300 MG capsule Take 300 mg by mouth at bedtime. Patient not taking: Reported on 02/10/2023 01/27/23   [provider]  hydrOXYzine (ATARAX) 25 MG tablet Take 25 mg by mouth daily. Patient not taking: Reported on 02/10/2023    [provider]  Lactulose 20 GM/30ML SOLN Take 30 mLs (20 g total) by mouth daily as needed (constipation). Patient not taking: Reported on 02/10/2023 09/09/22   Sharen Hones, MD  meloxicam (MOBIC) 7.5 MG tablet Take 1 tablet (7.5 mg total) by mouth daily. Patient not taking: Reported on 02/10/2023 02/04/23 02/04/24  Cuthriell, Charline Bills, PA-C  methocarbamol (ROBAXIN) 500 MG tablet Take 500 mg by mouth every 8 (eight) hours as needed. Patient not taking: Reported on 02/10/2023 01/27/23   [provider]  oxyCODONE-acetaminophen (PERCOCET) 5-325 MG tablet Take 1 tablet by mouth every 4 (four) hours as needed for severe pain. Patient not taking: Reported on 02/10/2023 01/25/23   Harvest Dark, MD  pantoprazole (PROTONIX) 40 MG tablet Take 40 mg by mouth daily. Patient not taking: Reported on 02/10/2023 04/18/22   [provider]  predniSONE (DELTASONE) 50 MG tablet Take 1 tablet (50 mg total) by mouth daily with breakfast. Patient not taking: Reported on 02/10/2023 02/04/23   Cuthriell, Charline Bills, PA-C  senna-docusate (SENOKOT-S) 8.6-50 MG tablet Take 1 tablet by mouth 2 (two) times daily as needed for mild constipation. Patient not taking: Reported on 02/10/2023 09/09/22   Sharen Hones, MD    Social History   Socioeconomic History   Marital status: Married    Spouse name: Not on file   Number of children: 3   Years of education: Not on file   Highest education level: Some college, no degree  Occupational History   Not on file  Tobacco Use   Smoking status: Never   Smokeless tobacco: Never  Vaping Use   Vaping Use: Never used  Substance and Sexual Activity   Alcohol use: No   Drug use: No   Sexual activity: Yes    Partners: Male    Birth control/protection: Surgical  Other Topics Concern   Not on file  Social History Narrative   Married lives with spouse   3 children   Left handed   1 year of college   Drinks coffee, tea and soda sometimes   Social Determinants of Health   Financial Resource Strain: Not on file  Food Insecurity: No Food Insecurity (02/11/2023)   Hunger Vital Sign    Worried About Running Out of Food in the  Last Year: Never true    Ran Out of Food in the Last Year: Never true  Transportation Needs: No Transportation Needs (02/11/2023)   PRAPARE - Hydrologist (Medical): No    Lack of Transportation (Non-Medical): No  Physical Activity: Not on file  Stress: Not on file  Social Connections: Not on file  Intimate Partner Violence: Not At Risk (02/11/2023)   Humiliation, Afraid, Rape, and Kick questionnaire    Fear of Current or Ex-Partner: No    Emotionally Abused: No    Physically Abused: No    Sexually Abused: No     Family History  Problem Relation Age of Onset   Heart Problems Father    Hypertension Other    CAD Other    Breast cancer  Neg Hx     ROS: Otherwise negative unless mentioned in HPI  Physical Examination  Vitals:   02/11/23 0415 02/11/23 0726  BP: (!) 174/75 (!) 157/75  Pulse: 64 65  Resp: 17 18  Temp: 100.2 F (37.9 C)   SpO2: 98% 100%   Body mass index is 31.92 kg/m.  General:  WDWN in NAD Gait: Not observed HENT: WNL, normocephalic Pulmonary: normal non-labored breathing, without Rales, rhonchi,  wheezing Cardiac: regular, without  Murmurs, rubs or gallops; without carotid bruits Abdomen: Positive Bowel sounds, soft, NT/ND, no masses Skin: without rashes Vascular Exam/Pulses: Positive palpable radial, and Pedal pulses  Extremities: without ischemic changes, without Gangrene , without cellulitis; without open wounds;  Musculoskeletal: no muscle wasting or atrophy  Neurologic: A&O X 3;  No focal weakness or paresthesias are detected; speech is fluent/normal Psychiatric:  The pt has Normal affect. Lymph:  Unremarkable  CBC    Component Value Date/Time   WBC 9.6 02/11/2023 0439   RBC 3.66 (L) 02/11/2023 0439   HGB 10.9 (L) 02/11/2023 0439   HGB 11.8 10/04/2019 0951   HCT 34.0 (L) 02/11/2023 0439   HCT 35.2 10/04/2019 0951   PLT 242 02/11/2023 0439   PLT 274 10/04/2019 0951   MCV 92.9 02/11/2023 0439   MCV 88  10/04/2019 0951   MCV 90 02/19/2015 1647   MCH 29.8 02/11/2023 0439   MCHC 32.1 02/11/2023 0439   RDW 13.2 02/11/2023 0439   RDW 12.1 10/04/2019 0951   RDW 13.9 02/19/2015 1647   LYMPHSABS 3.0 02/10/2023 1803   LYMPHSABS 0.9 (L) 02/19/2015 1647   MONOABS 0.5 02/10/2023 1803   MONOABS 0.6 02/19/2015 1647   EOSABS 0.6 (H) 02/10/2023 1803   EOSABS 0.2 02/19/2015 1647   BASOSABS 0.0 02/10/2023 1803   BASOSABS 0.2 (H) 02/19/2015 1647    BMET    Component Value Date/Time   NA 134 (L) 02/11/2023 0439   NA 141 12/29/2015 0942   NA 139 02/19/2015 1647   K 3.8 02/11/2023 0439   K 4.4 02/19/2015 1647   CL 101 02/11/2023 0439   CL 101 02/19/2015 1647   CO2 28 02/11/2023 0439   CO2 28 02/19/2015 1647   GLUCOSE 158 (H) 02/11/2023 0439   GLUCOSE 141 (H) 02/19/2015 1647   BUN 9 02/11/2023 0439   BUN 9 12/29/2015 0942   BUN 15 02/19/2015 1647   CREATININE 0.77 02/11/2023 0439   CREATININE 0.91 02/19/2015 1647   CALCIUM 8.9 02/11/2023 0439   CALCIUM 10.1 02/19/2015 1647   GFRNONAA >60 02/11/2023 0439   GFRNONAA >60 02/19/2015 1647   GFRAA >60 01/07/2019 1115   GFRAA >60 02/19/2015 1647    COAGS: Lab Results  Component Value Date   INR 1.1 02/10/2023   INR 1.0 05/18/2022   INR 0.94 01/07/2019     Non-Invasive Vascular Imaging:   EXAM: CT ANGIOGRAPHY HEAD AND NECK  RIGHT CAROTID SYSTEM: No dissection, occlusion or aneurysm. There is mixed density atherosclerosis extending into the proximal ICA, resulting in less than 50% stenosis.   LEFT CAROTID SYSTEM: No dissection, occlusion or aneurysm. There is calcified atherosclerosis extending into the proximal ICA, resulting in approximately 70% stenosis.   VERTEBRAL ARTERIES: Left dominant configuration. Both origins are clearly patent. There is no dissection, occlusion or flow-limiting stenosis to the skull base (V1-V3 segments).  EXAM: BILATERAL CAROTID DUPLEX ULTRASOUND  RIGHT CAROTID ARTERY: No significant  calcifications of the right common carotid artery. Intermediate waveform maintained. Moderate heterogeneous and partially  calcified plaque at the right carotid bifurcation. No significant lumen shadowing. Low resistance waveform of the right ICA. No significant tortuosity.   RIGHT VERTEBRAL ARTERY: Antegrade flow with low resistance waveform.   LEFT CAROTID ARTERY: No significant calcifications of the left common carotid artery. Intermediate waveform maintained. Moderate heterogeneous and partially calcified plaque at the left carotid bifurcation. No significant lumen shadowing. Low resistance waveform of the left ICA. No significant tortuosity.   LEFT VERTEBRAL ARTERY:  Antegrade flow with low resistance waveform.   IMPRESSION: Color duplex indicates moderate heterogeneous and calcified plaque, with no hemodynamically significant stenosis by duplex criteria in the extracranial cerebrovascular circulation.  Statin:  Yes.   Beta Blocker:  Yes.   Aspirin:  Yes.   ACEI:  No. ARB:  No. CCB use:  No Other antiplatelets/anticoagulants:  No.    ASSESSMENT/PLAN: This is a 72 y.o. female who presents to [facility] emergency department with signs and symptoms of transient ischemic attack/CVA.  Symptoms started at 5 PM yesterday with her inability to make full sentences/slurred speech and bilateral facial numbness.  Plan: Vascular surgery will take the patient to the vascular lab on Wednesday, 02/12/2023 for an endovascular left carotid stent placement with possible intervention.  I discussed in detail with the patient the procedure, benefits, risks, complications.  She verbalizes her understanding.  I answered all the patient's questions this morning.  Patient will be given a Plavix loading dose of 300 mg today.  She will also get her 81 mg of aspirin today.  Patient will be made n.p.o. after midnight for procedure tomorrow.   -I discussed the plan in detail with Dr. Hortencia Pilar MD and he  agrees with the plan.   Drema Pry [facility] 02/11/2023 7:40 AM

## 2023-02-12 ENCOUNTER — Encounter
Admission: EM | Disposition: A | Discharge status: Home Health | Payer: Self-pay | Source: Home / Self Care | Attending: Hospitalist

## 2023-02-12 ENCOUNTER — Encounter: Payer: Self-pay | Admitting: Vascular Surgery

## 2023-02-12 DIAGNOSIS — Q2549 Other congenital malformations of aorta: Secondary | ICD-10-CM | POA: Diagnosis not present

## 2023-02-12 DIAGNOSIS — R471 Dysarthria and anarthria: Secondary | ICD-10-CM

## 2023-02-12 DIAGNOSIS — Z8673 Personal history of transient ischemic attack (TIA), and cerebral infarction without residual deficits: Secondary | ICD-10-CM | POA: Diagnosis not present

## 2023-02-12 DIAGNOSIS — G459 Transient cerebral ischemic attack, unspecified: Secondary | ICD-10-CM

## 2023-02-12 DIAGNOSIS — I6522 Occlusion and stenosis of left carotid artery: Secondary | ICD-10-CM | POA: Diagnosis not present

## 2023-02-12 HISTORY — PX: CAROTID PTA/STENT INTERVENTION: CATH118231

## 2023-02-12 LAB — GLUCOSE, CAPILLARY
Glucose-Capillary: 146 mg/dL — ABNORMAL HIGH (ref 70–99)
Glucose-Capillary: 157 mg/dL — ABNORMAL HIGH (ref 70–99)
Glucose-Capillary: 149 mg/dL — ABNORMAL HIGH (ref 70–99)
Glucose-Capillary: 134 mg/dL — ABNORMAL HIGH (ref 70–99)
Glucose-Capillary: 116 mg/dL — ABNORMAL HIGH (ref 70–99)
Glucose-Capillary: 163 mg/dL — ABNORMAL HIGH (ref 70–99)

## 2023-02-12 LAB — BASIC METABOLIC PANEL
Anion gap: 6 (ref 5–15)
BUN: 16 mg/dL (ref 8–23)
CO2: 26 mmol/L (ref 22–32)
Calcium: 9.5 mg/dL (ref 8.9–10.3)
Chloride: 104 mmol/L (ref 98–111)
Creatinine, Ser: 0.85 mg/dL (ref 0.44–1.00)
GFR, Estimated: >60 mL/min (ref >60)
Glucose, Bld: 164 mg/dL — ABNORMAL HIGH (ref 70–99)
Potassium: 4 mmol/L (ref 3.5–5.1)
Sodium: 136 mmol/L (ref 135–145)

## 2023-02-12 LAB — POCT ACTIVATED CLOTTING TIME: Activated Clotting Time: 347 seconds

## 2023-02-12 LAB — HEMOGLOBIN A1C
Hgb A1c MFr Bld: 7 % — ABNORMAL HIGH (ref 4.8–5.6)
Mean Plasma Glucose: 154 mg/dL

## 2023-02-12 LAB — CBC
HCT: 34.9 % — ABNORMAL LOW (ref 36.0–46.0)
Hemoglobin: 11.3 g/dL — ABNORMAL LOW (ref 12.0–15.0)
MCH: 30.1 pg (ref 26.0–34.0)
MCHC: 32.4 g/dL (ref 30.0–36.0)
MCV: 92.8 fL (ref 80.0–100.0)
Platelets: 242 10*3/uL (ref 150–400)
RBC: 3.76 MIL/uL — ABNORMAL LOW (ref 3.87–5.11)
RDW: 13.2 % (ref 11.5–15.5)
WBC: 8.7 10*3/uL (ref 4.0–10.5)
nRBC: 0 % (ref 0.0–0.2)

## 2023-02-12 SURGERY — CAROTID PTA/STENT INTERVENTION
Anesthesia: Moderate Sedation | Laterality: Left

## 2023-02-12 MED ORDER — HYDROMORPHONE HCL 1 MG/ML IJ SOLN
INTRAMUSCULAR | Status: AC
  Filled 2023-02-12: qty 1

## 2023-02-12 MED ORDER — MIDAZOLAM HCL 2 MG/2ML IJ SOLN
INTRAMUSCULAR | Status: AC
  Filled 2023-02-12: qty 2

## 2023-02-12 MED ORDER — DOPAMINE-DEXTROSE 3.2-5 MG/ML-% IV SOLN
INTRAVENOUS | Status: AC
  Filled 2023-02-12: qty 250

## 2023-02-12 MED ORDER — ATROPINE SULFATE 1 MG/ML IV SOLN
INTRAVENOUS | Status: DC | PRN
  Administered 2023-02-12: 1 mg via INTRAVENOUS

## 2023-02-12 MED ORDER — EPINEPHRINE PF 1 MG/ML IJ SOLN
0.1000 mg | Freq: Once | INTRAMUSCULAR | Status: AC
  Filled 2023-02-12: qty 1

## 2023-02-12 MED ORDER — CHLOROPROCAINE HCL 2 % IJ SOLN
20.0000 mL | Freq: Once | INTRAMUSCULAR | Status: AC
  Filled 2023-02-12: qty 30

## 2023-02-12 MED ORDER — HEPARIN SODIUM (PORCINE) 1000 UNIT/ML IJ SOLN
INTRAMUSCULAR | Status: AC
  Filled 2023-02-12: qty 10

## 2023-02-12 MED ORDER — FENTANYL CITRATE (PF) 100 MCG/2ML IJ SOLN
INTRAMUSCULAR | Status: DC | PRN
  Administered 2023-02-12: 12.5 ug via INTRAVENOUS
  Administered 2023-02-12 (×2): 25 ug via INTRAVENOUS

## 2023-02-12 MED ORDER — HYDROMORPHONE HCL 1 MG/ML IJ SOLN: 0.5000 mg | Freq: Once | INTRAMUSCULAR | Status: DC

## 2023-02-12 MED ORDER — THROMBIN 5000 UNITS EX SOLR
CUTANEOUS | Status: AC
  Filled 2023-02-12: qty 5000

## 2023-02-12 MED ORDER — METHYLPREDNISOLONE SODIUM SUCC 125 MG IJ SOLR: 125.0000 mg | Freq: Once | INTRAMUSCULAR | Status: DC | PRN

## 2023-02-12 MED ORDER — ATROPINE SULFATE 1 MG/10ML IJ SOSY
PREFILLED_SYRINGE | INTRAMUSCULAR | Status: AC
  Filled 2023-02-12: qty 20

## 2023-02-12 MED ORDER — MIDAZOLAM HCL 2 MG/2ML IJ SOLN
INTRAMUSCULAR | Status: DC | PRN
  Administered 2023-02-12 (×2): .5 mg via INTRAVENOUS
  Administered 2023-02-12: 2 mg via INTRAVENOUS

## 2023-02-12 MED ORDER — DIPHENHYDRAMINE HCL 50 MG/ML IJ SOLN: 50.0000 mg | Freq: Once | INTRAMUSCULAR | Status: DC | PRN

## 2023-02-12 MED ORDER — FAMOTIDINE 20 MG PO TABS: 40.0000 mg | ORAL_TABLET | Freq: Once | ORAL | Status: DC | PRN

## 2023-02-12 MED ORDER — HEPARIN SODIUM (PORCINE) 1000 UNIT/ML IJ SOLN
INTRAMUSCULAR | Status: DC | PRN
  Administered 2023-02-12: 9000 [IU] via INTRAVENOUS

## 2023-02-12 MED ORDER — SODIUM CHLORIDE 0.9 % IV SOLN: INTRAVENOUS | Status: DC

## 2023-02-12 MED ORDER — CHLORHEXIDINE GLUCONATE CLOTH 2 % EX PADS
6.0000 | MEDICATED_PAD | Freq: Every day | CUTANEOUS | Status: DC
  Administered 2023-02-12 – 2023-02-13 (×2): 6 via TOPICAL

## 2023-02-12 MED ORDER — CEFAZOLIN SODIUM-DEXTROSE 2-4 GM/100ML-% IV SOLN: 2.0000 g | INTRAVENOUS | Status: AC

## 2023-02-12 MED ORDER — CEFAZOLIN SODIUM-DEXTROSE 2-4 GM/100ML-% IV SOLN
INTRAVENOUS | Status: AC
  Administered 2023-02-12: 2 g via INTRAVENOUS
  Filled 2023-02-12: qty 100

## 2023-02-12 MED ORDER — IODIXANOL 320 MG/ML IV SOLN
INTRAVENOUS | Status: DC | PRN
  Administered 2023-02-12: 45 mL via INTRA_ARTERIAL

## 2023-02-12 MED ORDER — PHENYLEPHRINE 80 MCG/ML (10ML) SYRINGE FOR IV PUSH (FOR BLOOD PRESSURE SUPPORT)
PREFILLED_SYRINGE | INTRAVENOUS | Status: AC
  Filled 2023-02-12: qty 10

## 2023-02-12 MED ORDER — PHENYLEPHRINE HCL-NACL 20-0.9 MG/250ML-% IV SOLN
INTRAVENOUS | Status: AC
  Filled 2023-02-12: qty 250

## 2023-02-12 MED ORDER — SODIUM CHLORIDE 0.9 % IV BOLUS
1000.0000 mL | Freq: Once | INTRAVENOUS | Status: AC
  Administered 2023-02-12: 1000 mL via INTRAVENOUS

## 2023-02-12 MED ORDER — FENTANYL CITRATE PF 50 MCG/ML IJ SOSY: 12.5000 ug | PREFILLED_SYRINGE | Freq: Once | INTRAMUSCULAR | Status: DC | PRN

## 2023-02-12 MED ORDER — MIDAZOLAM HCL 2 MG/ML PO SYRP: 8.0000 mg | ORAL_SOLUTION | Freq: Once | ORAL | Status: DC | PRN

## 2023-02-12 MED ORDER — HYDROMORPHONE HCL 1 MG/ML IJ SOLN
1.0000 mg | Freq: Once | INTRAMUSCULAR | Status: AC | PRN
  Administered 2023-02-12: 0.5 mg via INTRAVENOUS
  Filled 2023-02-12: qty 1

## 2023-02-12 MED ORDER — FENTANYL CITRATE (PF) 100 MCG/2ML IJ SOLN
INTRAMUSCULAR | Status: AC
  Filled 2023-02-12: qty 2

## 2023-02-12 MED ORDER — ONDANSETRON HCL 4 MG/2ML IJ SOLN
4.0000 mg | Freq: Four times a day (QID) | INTRAMUSCULAR | Status: DC | PRN
  Administered 2023-02-12: 4 mg via INTRAVENOUS
  Filled 2023-02-12: qty 2

## 2023-02-12 MED ORDER — CHLORHEXIDINE GLUCONATE 4 % EX LIQD: 60.0000 mL | Freq: Once | CUTANEOUS | Status: DC

## 2023-02-12 MED ORDER — CHLORHEXIDINE GLUCONATE 4 % EX LIQD
60.0000 mL | Freq: Once | CUTANEOUS | Status: DC
  Administered 2023-02-12: 4 via TOPICAL

## 2023-02-12 SURGICAL SUPPLY — 28 items
BALLN VIATRAC 5X30X135 (BALLOONS) ×1
BALLOON VIATRAC 5X30X135 (BALLOONS) IMPLANT
CATH ANGIO 5F PIGTAIL 100CM (CATHETERS) IMPLANT
CATH ANGIO 5F SIM1 100CM (CATHETERS) IMPLANT
CATH VERT 100CM (CATHETERS) IMPLANT
COVER DRAPE FLUORO 36X44 (DRAPES) IMPLANT
COVER PROBE ULTRASOUND 5X96 (MISCELLANEOUS) IMPLANT
DEVICE EMBOSHIELD NAV6 4.0-7.0 (FILTER) IMPLANT
DEVICE SAFEGUARD 24CM (GAUZE/BANDAGES/DRESSINGS) IMPLANT
DEVICE STARCLOSE SE CLOSURE (Vascular Products) IMPLANT
DEVICE TORQUE (MISCELLANEOUS) IMPLANT
GLIDEWIRE ANGLED SS 035X260CM (WIRE) IMPLANT
GUIDEWIRE VASC STIFF .038X260 (WIRE) IMPLANT
KIT ENCORE 26 ADVANTAGE (KITS) IMPLANT
KIT MANI 3VAL PERCEP (MISCELLANEOUS) IMPLANT
NDL ENTRY 21GA 7CM ECHOTIP (NEEDLE) IMPLANT
NEEDLE ENTRY 21GA 7CM ECHOTIP (NEEDLE) ×1 IMPLANT
PACK ANGIOGRAPHY (CUSTOM PROCEDURE TRAY) ×2 IMPLANT
PANNUS RETENTION SYSTEM 2 PAD (MISCELLANEOUS) IMPLANT
SET INTRO CAPELLA COAXIAL (SET/KITS/TRAYS/PACK) IMPLANT
SHEATH BRITE TIP 6FRX11 (SHEATH) IMPLANT
SHEATH NEURON MAX 6FR 80CM (SHEATH) IMPLANT
STENT XACT CAR 9-7X40X136 (Permanent Stent) IMPLANT
SUT MNCRL AB 4-0 PS2 18 (SUTURE) IMPLANT
SYR MEDRAD MARK 7 150ML (SYRINGE) IMPLANT
TUBING CONTRAST HIGH PRESS 72 (TUBING) IMPLANT
WIRE EMERALD 3MM-J .035X150CM (WIRE) IMPLANT
WIRE SUPRACORE 300CM (WIRE) IMPLANT

## 2023-02-12 NOTE — Interval H&P Note (Signed)
History and Physical Interval Note:  02/12/2023 3:18 PM  Destiny Savage  has presented today for surgery, with the diagnosis of TIA/CVA.  The various methods of treatment have been discussed with the patient and family. After consideration of risks, benefits and other options for treatment, the patient has consented to  Procedure(s): CAROTID PTA/STENT INTERVENTION (Left) as a surgical intervention.  The patient's history has been reviewed, patient examined, no change in status, stable for surgery.  I have reviewed the patient's chart and labs.  Questions were answered to the patient's satisfaction.     Hortencia Pilar

## 2023-02-12 NOTE — Progress Notes (Signed)
SLP Cancellation Note  Patient Details Name: Destiny Savage MRN: [id number] DOB: [birth year]   Cancelled treatment:       Reason Eval/Treat Not Completed: SLP screened, no needs identified, will sign off. Consult received and appreciated. Met w/ pt in her room. Pt readily conversed w/ ST regarding variety of topics including medical issues, foods she can eat (d/t recent nauseous feelings w/ certain foods), and reason for hospital admission. Pt independently expressed wants/needs. Pt's speech 100% intelligible and w/ normal vocal quality. Pt expressed no difficulties w/ swallowing at this time. Pt encouraged to reach out to PCP if any new needs arise after d/c/return to ADLs. Pt appreciative/agreed.  No acute ST needs at this time. ST services will s/o. MD to reconsult if any new needs arise during this admit.  Randall Hiss Graduate Clinician [place], Speech Pathology   Randall Hiss 02/12/2023, 1:44 PM

## 2023-02-12 NOTE — Progress Notes (Signed)
  PROGRESS NOTE    Destiny Savage  [id number] DOB: [birth year] DOA: 02/10/2023 PCP: Albina Billet, MD  [id number]/[id number]-AA  LOS: 1 day   Brief hospital course:   Assessment & Plan: Destiny Savage is a 72 y.o. African-American female with medical history significant for type 2 diabetes mellitus, hypertension, dyslipidemia, GERD, asthma, TIA and CVA, who presented to the emergency room with acute onset of expressive dysphagia and slurred speech with associated left-sided numbness that started around 5 PM today.  She was having "funny feeling" in her head with associated mild left facial numbness.  She admitted to difficulty with ambulation.    TIA: w/ left sided numbness, expressive dysphasia & dysarthria.  MRI brain was WNL. Echo without thrombus or interatrial shunt.  Continue on aspirin, plavix.    Left internal carotid artery stenosis: continue on aspirin, plavix.  --left carotid stent today.   Hypokalemia:  --monitor and replete PRN   HLD: continue on statin    HTN: continue on coreg, lasix   GERD: continue on PPI    Peripheral neuropathy: continue on gabapentin   DM2, well controlled --A1c 7.0 --d/c BG checks   DVT prophylaxis: Lovenox SQ Code Status: Full code  Family Communication:  Level of care: ICU Dispo:   The patient is from: home Anticipated d/c is to: to be determined Anticipated d/c date is: to be determined   Subjective and Interval History:  Pt's neurological symptoms have resolved.    Going for left carotid stent today.   Objective: Vitals:   02/12/23 1845 02/12/23 1900 02/12/23 1930 02/12/23 2000  BP: 114/88 109/71 112/72 108/65  Pulse: 79 90 80 74  Resp: (!) 21 16 (!) 24 17  Temp:    97.8 F (36.6 C)  TempSrc:    Oral  SpO2: 100% 94% 100% 100%  Weight:      Height:       No intake or output data in the 24 hours ending 02/12/23 2048 Filed Weights   02/10/23 1939  Weight: 89.7 kg    Examination:   Constitutional: NAD,  AAOx3 HEENT: conjunctivae and lids normal, EOMI CV: No cyanosis.   RESP: normal respiratory effort, on RA Neuro: II - XII grossly intact.   Psych: Normal mood and affect.  Appropriate judgement and reason   Data Reviewed: I have personally reviewed labs and imaging studies  Time spent: 50 minutes  Enzo Bi, MD [facility] If 7PM-7AM, please contact night-coverage 02/12/2023, 8:48 PM

## 2023-02-12 NOTE — Progress Notes (Signed)
PT Cancellation Note  Patient Details Name: Destiny Savage MRN: [id number] DOB: 20-Apr-1951   Cancelled Treatment:    Reason Eval/Treat Not Completed: Medical issues which prohibited therapy (Patient scheduled for carotid stenting this date.  Will re-attempt evaluation post-procedure as medically appropriate.)   Sreeja Spies H. Owens Shark, PT, DPT, NCS 02/12/23, 12:08 PM [phone]

## 2023-02-12 NOTE — Progress Notes (Signed)
OT Cancellation Note  Patient Details Name: Destiny Savage MRN: [id number] DOB: 09-10-1951   Cancelled Treatment:    Reason Eval/Treat Not Completed: Medical issues which prohibited therapy. Pt scheduled for L carotid stenting this date. Will re-attempt OT evaluation post-procedure as medically appropriate.   Doneta Public 02/12/2023, 1:11 PM

## 2023-02-13 DIAGNOSIS — Z9889 Other specified postprocedural states: Secondary | ICD-10-CM

## 2023-02-13 DIAGNOSIS — Z95828 Presence of other vascular implants and grafts: Secondary | ICD-10-CM

## 2023-02-13 LAB — BASIC METABOLIC PANEL
Anion gap: 9 (ref 5–15)
BUN: 15 mg/dL (ref 8–23)
CO2: 24 mmol/L (ref 22–32)
Calcium: 9 mg/dL (ref 8.9–10.3)
Chloride: 106 mmol/L (ref 98–111)
Creatinine, Ser: 0.84 mg/dL (ref 0.44–1.00)
GFR, Estimated: >60 mL/min (ref >60)
Glucose, Bld: 127 mg/dL — ABNORMAL HIGH (ref 70–99)
Potassium: 4.1 mmol/L (ref 3.5–5.1)
Sodium: 139 mmol/L (ref 135–145)

## 2023-02-13 LAB — CBC
HCT: 31.4 % — ABNORMAL LOW (ref 36.0–46.0)
Hemoglobin: 10.2 g/dL — ABNORMAL LOW (ref 12.0–15.0)
MCH: 30.4 pg (ref 26.0–34.0)
MCHC: 32.5 g/dL (ref 30.0–36.0)
MCV: 93.5 fL (ref 80.0–100.0)
Platelets: 250 10*3/uL (ref 150–400)
RBC: 3.36 MIL/uL — ABNORMAL LOW (ref 3.87–5.11)
RDW: 13.4 % (ref 11.5–15.5)
WBC: 10.8 10*3/uL — ABNORMAL HIGH (ref 4.0–10.5)
nRBC: 0 % (ref 0.0–0.2)

## 2023-02-13 LAB — MAGNESIUM: Magnesium: 1.7 mg/dL (ref 1.7–2.4)

## 2023-02-13 LAB — GLUCOSE, CAPILLARY
Glucose-Capillary: 107 mg/dL — ABNORMAL HIGH (ref 70–99)
Glucose-Capillary: 292 mg/dL — ABNORMAL HIGH (ref 70–99)
Glucose-Capillary: 133 mg/dL — ABNORMAL HIGH (ref 70–99)

## 2023-02-13 MED FILL — Atropine Sulfate Soln Prefill Syr 1 MG/10ML (0.1 MG/ML): INTRAMUSCULAR | Qty: 10 | Status: AC

## 2023-02-13 NOTE — Evaluation (Signed)
Physical Therapy Evaluation Patient Details Name: Destiny Savage MRN: [id number] DOB: [birth year] Today's Date: 02/13/2023  History of Present Illness  presented to ER secondary to acute onset of dizziness, expressive speech difficulty, L-sided weakness; admitted for TIA/CVA work up and management.  Now s/p L carotid stenting (due to 70% occlusion) on 02/12/23.  Clinical Impression  Patient resting in bed upon arrival to room; alert and oriented, follows commands, eager for mobility efforts and to see "a friendly face".  Endorses generalized headache (FACES 4/10); meds requested per RN as available.  Patient endorses resolution of initial symptoms with no persistent sensory deficits to L-side.  Speech clear and articulate; effectively communicates wants/needs and maintains clear, fluent conversation throughout evaluation.  Bilat UE/LE strength and ROM grossly symmetrical and WFL; no focal weakness, sensory or coordination deficit appreciated.  Able to complete bed mobility with mod indep; sit/stand, basic transfers and gait (80') with RW, cga/close sup.  Demonstrates reciprocal stepping pattern with fair/good gait mechanics; slow and cautious, but no overt buckling or LOB. Does prefer use of RW at current time (has one at home) and is agreeable to use upon discharge for optimal safety/indep  Would benefit from skilled PT to address above deficits and promote optimal return to PLOF.; recommend post-acute rehab services in familiar, normal environment/routine to optimize safety/indep with functional activities and ADLs.     Recommendations for follow up therapy are one component of a multi-disciplinary discharge planning process, led by the attending physician.  Recommendations may be updated based on patient status, additional functional criteria and insurance authorization.  Follow Up Recommendations       Assistance Recommended at Discharge PRN  Patient can return home with the following  A little  help with walking and/or transfers;A little help with bathing/dressing/bathroom    Equipment Recommendations  (has RW, SPC, QC)  Recommendations for Other Services       Functional Status Assessment Patient has had a recent decline in their functional status and demonstrates the ability to make significant improvements in function in a reasonable and predictable amount of time.     Precautions / Restrictions Precautions Precautions: Fall Restrictions Weight Bearing Restrictions: No      Mobility  Bed Mobility Overal bed mobility: Modified Independent                  Transfers Overall transfer level: Needs assistance Equipment used: Rolling walker (2 wheels) Transfers: Sit to/from Stand Sit to Stand: Min guard, Supervision           General transfer comment: cuing for hand placement to prevent pulling on RW    Ambulation/Gait Ambulation/Gait assistance: Min guard, Supervision Gait Distance (Feet): 80 Feet Assistive device: Rolling walker (2 wheels)         General Gait Details: reciprocal stepping pattern with fair/good gait mechanics; slow and cautious, but no overt buckling or LOB.  Does prefer use of RW at current time (has one at home) and is agreeable to use upon discharge for optimal safety/indep  Stairs            Wheelchair Mobility    Modified Rankin (Stroke Patients Only)       Balance Overall balance assessment: Needs assistance Sitting-balance support: No upper extremity supported, Feet supported Sitting balance-Leahy Scale: Good     Standing balance support: Bilateral upper extremity supported Standing balance-Leahy Scale: Fair  Pertinent Vitals/Pain Pain Assessment Pain Assessment: Faces Faces Pain Scale: Hurts little more Pain Location: headache Pain Descriptors / Indicators: Headache Pain Intervention(s): Limited activity within patient's tolerance, Monitored during session,  Repositioned    Home Living Family/patient expects to be discharged to:: Private residence Living Arrangements: Spouse/significant other Available Help at Discharge: Family   Home Access: Stairs to enter   Technical brewer of Steps: 1   Home Layout: One level Home Equipment: [place] - single point;[place] (2 wheels)      Prior Function Prior Level of Function : Independent/Modified Independent             Mobility Comments: Indep without assist device at baseline, has been using SPC/QC in recent weeks due to "feeling off" and sciatica flare.  Denies fall history, but several 'near misses' requiring unplanned rest/transition to chair to prevent ADLs Comments: indep with ADLs; husband assists with household chores as needed     Hand Dominance   Dominant Hand: Left    Extremity/Trunk Assessment   Upper Extremity Assessment Upper Extremity Assessment: Overall WFL for tasks assessed (grossly 4+/5 throughout; no focal weakness, sensory or coordination deficits appreciated)    Lower Extremity Assessment Lower Extremity Assessment: Overall WFL for tasks assessed (grossly 4+/5 throughout; no focal weakness, sensory or coordination deficits appreciated)       Communication   Communication: No difficulties  Cognition Arousal/Alertness: Awake/alert Behavior During Therapy: WFL for tasks assessed/performed Overall Cognitive Status: Within Functional Limits for tasks assessed                                          General Comments      Exercises     Assessment/Plan    PT Assessment Patient needs continued PT services  PT Problem List Decreased activity tolerance;Decreased balance;Decreased mobility;Decreased coordination;Decreased knowledge of use of DME;Decreased safety awareness;Decreased knowledge of precautions       PT Treatment Interventions DME instruction;Gait training;Stair training;Functional mobility  training;Therapeutic activities;Therapeutic exercise;Balance training;Neuromuscular re-education;Cognitive remediation;Patient/family education    PT Goals (Current goals can be found in the Care Plan section)  Acute Rehab PT Goals Patient Stated Goal: to return home PT Goal Formulation: With patient Time For Goal Achievement: 02/27/23 Potential to Achieve Goals: Good    Frequency Min 2X/week     Co-evaluation               AM-PAC PT "6 Clicks" Mobility  Outcome Measure Help needed turning from your back to your side while in a flat bed without using bedrails?: None Help needed moving from lying on your back to sitting on the side of a flat bed without using bedrails?: None Help needed moving to and from a bed to a chair (including a wheelchair)?: A Little Help needed standing up from a chair using your arms (e.g., wheelchair or bedside chair)?: A Little Help needed to walk in hospital room?: A Little Help needed climbing 3-5 steps with a railing? : A Little 6 Click Score: 20    End of Session Equipment Utilized During Treatment: Gait belt Activity Tolerance: Patient tolerated treatment well Patient left: in chair;with call bell/phone within reach Nurse Communication: Mobility status PT Visit Diagnosis: Difficulty in walking, not elsewhere classified (R26.2);Hemiplegia and hemiparesis Hemiplegia - Right/Left: Left Hemiplegia - dominant/non-dominant: Dominant Hemiplegia - caused by: Other cerebrovascular disease    Time: 0925-0950 PT Time Calculation (min) (  ACUTE ONLY): 25 min   Charges:   PT Evaluation $PT Eval Moderate Complexity: 1 Mod          Darlisha Kelm H. Owens Shark, PT, DPT, NCS 02/13/23, 10:35 AM [phone]

## 2023-02-13 NOTE — Plan of Care (Signed)

## 2023-02-13 NOTE — Evaluation (Signed)
Occupational Therapy Evaluation Patient Details Name: Destiny Savage MRN: [id number] DOB: [birth year] Today's Date: 02/13/2023   History of Present Illness presented to ER secondary to acute onset of dizziness, expressive speech difficulty, L-sided weakness; admitted for TIA/CVA work up and management.  Now s/p L carotid stenting (due to 70% occlusion) on 02/12/23.   Clinical Impression   Destiny Savage presents with generalized weakness, limited endurance, and mild pain. Pt lives with her spouse in a single-level home, largely Mod I in fxl mobility, ambulating with a quad cane, spouse assisting with IADL PRN. During today's OT evaluation, she is A&O x 4, endorses mild L hip pain, is able to perform bed mobility with Mod I, transfer and ambulate with RW and SUPV. Pt is conversational, demonstrating no hesitancy/difficulties with speech. B/l UE strength and ROM grossly symmetrical and WFL. Discussed RW vs. QC, with pt stating she will use RW upon return home, given that she has felt "off" lately and has experienced sciatica flare-up. Pt is not far from her baseline level of fxl mobility, however does demonstrate mild balance impairments and weakness, recommend HHOT post DC.     Recommendations for follow up therapy are one component of a multi-disciplinary discharge planning process, led by the attending physician.  Recommendations may be updated based on patient status, additional functional criteria and insurance authorization.   Assistance Recommended at Discharge PRN  Patient can return home with the following Assistance with cooking/housework;A little help with bathing/dressing/bathroom    Functional Status Assessment  Patient has had a recent decline in their functional status and demonstrates the ability to make significant improvements in function in a reasonable and predictable amount of time.  Equipment Recommendations  None recommended by OT    Recommendations for Other Services        Precautions / Restrictions Precautions Precautions: Fall Restrictions Weight Bearing Restrictions: No      Mobility Bed Mobility Overal bed mobility: Modified Independent                  Transfers Overall transfer level: Needs assistance Equipment used: Rolling walker (2 wheels) Transfers: Sit to/from Stand Sit to Stand: Supervision                  Balance Overall balance assessment: Needs assistance Sitting-balance support: No upper extremity supported, Feet supported Sitting balance-Leahy Scale: Good     Standing balance support: Bilateral upper extremity supported Standing balance-Leahy Scale: Good                             ADL either performed or assessed with clinical judgement   ADL Overall ADL's : Modified independent;At baseline                                             Vision         Perception     Praxis      Pertinent Vitals/Pain Pain Assessment Faces Pain Scale: Hurts little more Pain Location: L hip Pain Intervention(s): Repositioned, Limited activity within patient's tolerance     Hand Dominance Left   Extremity/Trunk Assessment Upper Extremity Assessment Upper Extremity Assessment: Overall WFL for tasks assessed   Lower Extremity Assessment Lower Extremity Assessment: Overall WFL for tasks assessed       Communication Communication Communication: No difficulties  Cognition Arousal/Alertness: Awake/alert Behavior During Therapy: WFL for tasks assessed/performed Overall Cognitive Status: Within Functional Limits for tasks assessed                                       General Comments       Exercises Other Exercises Other Exercises: Educ re: signs/symptoms of stroke and importance of seeking immediate medical assistance   Shoulder Instructions      Home Living Family/patient expects to be discharged to:: Private residence Living Arrangements:  Spouse/significant other Available Help at Discharge: Family;Available 24 hours/day Type of Home: House Home Access: Stairs to enter [employer] of Steps: 1   Home Layout: One level     Bathroom Shower/Tub: Teacher, early years/pre: Standard     Home Equipment: Cane - single point;[place] (2 wheels)          Prior Functioning/Environment Prior Level of Function : Independent/Modified Independent             Mobility Comments: Indep without assist device at baseline, has been using SPC/QC in recent weeks due to "feeling off" and sciatica flare.  Denies fall history, but several 'near misses' requiring unplanned rest/transition to chair to prevent ADLs Comments: indep with ADLs; husband assists with household chores as needed        OT Problem List: Decreased strength;Decreased activity tolerance;Impaired balance (sitting and/or standing)      OT Treatment/Interventions:      OT Goals(Current goals can be found in the care plan section) Acute Rehab OT Goals Patient Stated Goal: to get some sleep soon OT Goal Formulation: With patient Time For Goal Achievement: 02/27/23 Potential to Achieve Goals: Good  OT Frequency:      Co-evaluation              AM-PAC OT "6 Clicks" Daily Activity     Outcome Measure Help from another person eating meals?: None Help from another person taking care of personal grooming?: None Help from another person toileting, which includes using toliet, bedpan, or urinal?: A Little Help from another person bathing (including washing, rinsing, drying)?: A Little Help from another person to put on and taking off regular upper body clothing?: None Help from another person to put on and taking off regular lower body clothing?: A Little 6 Click Score: 21   End of Session Equipment Utilized During Treatment: Rolling walker (2 wheels)  Activity Tolerance: Patient tolerated treatment well Patient left:  in bed;with family/visitor present;with call bell/phone within reach;with bed alarm set  OT Visit Diagnosis: Unsteadiness on feet (R26.81);Muscle weakness (generalized) (M62.81);Other abnormalities of gait and mobility (R26.89)                Time: 1130-1140 OT Time Calculation (min): 10 min Charges:  OT General Charges $OT Visit: 1 Visit OT Evaluation $OT Eval Low Complexity: 1 Low OT Treatments $Self Care/Home Management : 8-22 mins Josiah Lobo, PhD, MS, OTR/L 02/13/23, 12:42 PM

## 2023-02-13 NOTE — Progress Notes (Signed)
Progress Note    02/13/2023 12:38 PM 1 Day Post-Op  Subjective: Destiny Savage is a 72 year old female now status postop day 1 from endovascular left carotid stent placement.  Patient is resting comfortably in bed this morning eating breakfast.  Patient's husband is at the bedside.  Vitals all remained stable this morning.  Heart rate was within normal limits and blood pressure is within normal limits.  Patient's right groin has pressure dressing applied.  Patient was instructed to leave the pressure dressing on today.  Shower tomorrow with the dressing in place and then remove afterwards.  Pat dry completely.  Cover incision area with a Band-Aid for the next 3 days.  Patient verbalized her understanding.  Patient will follow-up with vein and vascular clinic in 1 month with carotid ultrasounds.   Vitals:   02/13/23 0500 02/13/23 0600  BP: 107/63 (!) 115/59  Pulse: 73 61  Resp: 16 12  Temp:    SpO2: 100% 97%   Physical Exam: Cardiac:  RRR Lungs: Normal respiratory effort, clear to auscultation all fields. Incisions: Right groin incision with pressure dressing, clean dry and intact.  No hematoma seroma to note. Extremities: Lower extremity is warm to touch with palpable pulses. Abdomen: Positive bowel sounds all 4 quadrants, soft, nontender, nondistended Neurologic: Alert and oriented x 4 this morning.  Patient answers all questions appropriately.  Patient follows all commands appropriately.  Signs or symptoms of TIA or CVA  CBC    Component Value Date/Time   WBC 10.8 (H) 02/13/2023 0504   RBC 3.36 (L) 02/13/2023 0504   HGB 10.2 (L) 02/13/2023 0504   HGB 11.8 10/04/2019 0951   HCT 31.4 (L) 02/13/2023 0504   HCT 35.2 10/04/2019 0951   PLT 250 02/13/2023 0504   PLT 274 10/04/2019 0951   MCV 93.5 02/13/2023 0504   MCV 88 10/04/2019 0951   MCV 90 02/19/2015 1647   MCH 30.4 02/13/2023 0504   MCHC 32.5 02/13/2023 0504   RDW 13.4 02/13/2023 0504   RDW 12.1 10/04/2019 0951   RDW  13.9 02/19/2015 1647   LYMPHSABS 3.0 02/10/2023 1803   LYMPHSABS 0.9 (L) 02/19/2015 1647   MONOABS 0.5 02/10/2023 1803   MONOABS 0.6 02/19/2015 1647   EOSABS 0.6 (H) 02/10/2023 1803   EOSABS 0.2 02/19/2015 1647   BASOSABS 0.0 02/10/2023 1803   BASOSABS 0.2 (H) 02/19/2015 1647    BMET    Component Value Date/Time   NA 139 02/13/2023 0504   NA 141 12/29/2015 0942   NA 139 02/19/2015 1647   K 4.1 02/13/2023 0504   K 4.4 02/19/2015 1647   CL 106 02/13/2023 0504   CL 101 02/19/2015 1647   CO2 24 02/13/2023 0504   CO2 28 02/19/2015 1647   GLUCOSE 127 (H) 02/13/2023 0504   GLUCOSE 141 (H) 02/19/2015 1647   BUN 15 02/13/2023 0504   BUN 9 12/29/2015 0942   BUN 15 02/19/2015 1647   CREATININE 0.84 02/13/2023 0504   CREATININE 0.91 02/19/2015 1647   CALCIUM 9.0 02/13/2023 0504   CALCIUM 10.1 02/19/2015 1647   GFRNONAA >60 02/13/2023 0504   GFRNONAA >60 02/19/2015 1647   GFRAA >60 01/07/2019 1115   GFRAA >60 02/19/2015 1647    INR    Component Value Date/Time   INR 1.1 02/10/2023 1803   INR 1.0 07/10/2014 0235     Intake/Output Summary (Last 24 hours) at 02/13/2023 1238 Last data filed at 02/13/2023 1056 Gross per 24 hour  Intake 2248.68 ml  Output 900 ml  Net 1348.68 ml     Assessment/Plan:  72 y.o. female is s/p endovascular left carotid stent placement 1 Day Post-Op   PLAN: Vascular surgery patient is cleared for discharge today if primary team agrees.  Patient will need to be discharged on 81 mg of aspirin daily and 75 mg of Plavix daily and continue taking her current statin medication.  Patient was instructed on right groin dressing changes.  Patient verbalizes her understanding.  She will return to clinic in 1 month with carotid ultrasounds.  DVT prophylaxis:  ASA 81 mg Daily and Plavix 75 mg Daily.    Drema Pry [facility] 02/13/2023 12:38 PM

## 2023-02-13 NOTE — TOC Transition Note (Signed)
Transition of Care [facility]) - CM/SW Discharge Note   Patient Details  Name: Destiny Savage MRN: [id number] Date of Birth: [birth year]  [facility] CM/SW Contact:  Ross Ludwig, LCSW Phone Number: 02/13/2023, 11:54 AM   Clinical Narrative:    CSW spoke with patient and her husband to discuss preference for home health agencies, they did not have a preference.  Patient had [facility] in the past, CSW spoke to [place] at [place] and they can accept patient for home health services.  Patient will be going home with home health PT and OT through [facility].  TOC signing off please reconsult with any other TOC needs, home health agency has been notified of planned discharge.   Final next level of care: Home w Home Health Services Barriers to Discharge: Barriers Resolved   Patient Goals and CMS Choice CMS Medicare.gov Compare Post Acute Care list provided to:: Patient Choice offered to / list presented to : Patient  Discharge Placement                      Patient and family notified of of transfer: 02/13/23  Discharge Plan and Services Additional resources added to the After Visit Summary for                            [facility] Arranged: PT, OT [place] Agency: [place] ([place]) Date [place]: 02/13/23 Time [place]: [place] Representative spoke with at [place]: [place] Determinants of Health ([place]) Interventions SDOH Screenings   Food Insecurity: No Food Insecurity (02/11/2023)  Housing: Low Risk  (02/11/2023)  Transportation Needs: No Transportation Needs (02/11/2023)  Utilities: Not At Risk (02/11/2023)  Tobacco Use: Low Risk  (02/12/2023)     Readmission Risk Interventions     No data to display

## 2023-02-13 NOTE — Discharge Summary (Signed)
Physician Discharge Summary   Destiny Savage  female DOB: [birth year]  [id number]  PCP: Albina Billet, MD  Admit date: 02/10/2023 Discharge date: 02/13/2023  Admitted From: home Disposition:  home Home Health: Yes CODE STATUS: Full code   Hospital Course:  For full details, please see H&P, progress notes, consult notes and ancillary notes.  Briefly,  Destiny Savage is a 72 y.o. African-American female with medical history significant for type 2 diabetes mellitus, hypertension, dyslipidemia, TIA and CVA, who presented to the emergency room with acute onset of expressive dysphagia and slurred speech with associated left-sided numbness that started around 5 PM pm the day of presentation.  She was having "funny feeling" in her head with associated mild left facial numbness.  She admitted to difficulty with ambulation.    TIA Complained of left sided numbness, expressive dysphasia & dysarthria.  All symptoms resolved after presentation. MRI brain was WNL.  CTA head/neck showed "No emergent large vessel occlusion or high-grade stenosis of the intracranial arteries.  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 Less than 50% stenosis on the right." Echo without thrombus or interatrial shunt.  --Continue on aspirin, plavix.    Left internal carotid artery stenosis:  S/p left carotid stent on 02/12/23 CTA head/neck showed Approximately 70% stenosis of the proximal left internal carotid artery secondary to calcified atherosclerosis.  Received stent placement with vascular surgery.  Post procedure, pt had some bleeding at the groin cath entry site.  Vascular NP checked wound and gave care instruction prior to discharge. --continue on aspirin, plavix.  --return to vascular clinic in 1 month with carotid ultrasounds.    Hypokalemia:  --monitored and repleted PRN   HLD:  continue on statin    HTN:  continue on coreg,  lasix   GERD:  Pt was not taking PPI PTA   Peripheral neuropathy:  Pt was not taking gabapentin PTA   DM2, well controlled --A1c 7.0 --d/c'ed BG checks   Unless noted above, medications under "STOP" list are ones pt was not taking PTA.  Discharge Diagnoses:  Principal Problem:   TIA (transient ischemic attack) Active Problems:   Hypokalemia   Internal carotid artery stenosis, left   Essential hypertension   Dyslipidemia   GERD without esophagitis   Peripheral neuropathy   Carotid stenosis   30 Day Unplanned Readmission Risk Score    Flowsheet Row ED to Hosp-Admission (Current) from 02/10/2023 in [place] ICU/CCU  30 Day Unplanned Readmission Risk Score (%) 15.34 Filed at 02/13/2023 0801       This score is the patient's risk of an unplanned readmission within 30 days of being discharged (0 -100%). The score is based on dignosis, age, lab data, medications, orders, and past utilization.   Low:  0-14.9   Medium: 15-21.9   High: 22-29.9   Extreme: 30 and above         Discharge Instructions:  Allergies as of 02/13/2023       Reactions   Latex Hives, Rash   Other reaction(s): Other (See Comments)   Lidocaine Hives   Phenobarbital-belladonna Alk    Other reaction(s): Hives & Rash   Phenobarbital-belladonna Alk Hives, Rash        Medication List     STOP taking these medications    gabapentin 300 MG capsule Commonly known as: NEURONTIN   hydrOXYzine 25 MG tablet Commonly known as: ATARAX   Lactulose 20 GM/30ML Soln  meloxicam 7.5 MG tablet Commonly known as: Mobic   methocarbamol 500 MG tablet Commonly known as: ROBAXIN   oxyCODONE-acetaminophen 5-325 MG tablet Commonly known as: Percocet   pantoprazole 40 MG tablet Commonly known as: PROTONIX   predniSONE 50 MG tablet Commonly known as: DELTASONE   senna-docusate 8.6-50 MG tablet Commonly known as: Senokot-S       TAKE these medications    acetaminophen  500 MG tablet Commonly known as: TYLENOL Take 500-1,000 mg by mouth every 8 (eight) hours as needed.   albuterol 108 (90 Base) MCG/ACT inhaler Commonly known as: VENTOLIN HFA Inhale 1 puff into the lungs every 6 (six) hours as needed for wheezing.   aspirin 81 MG tablet Take 1 tablet (81 mg total) by mouth daily.   atorvastatin 40 MG tablet Commonly known as: LIPITOR Take 1 tablet (40 mg total) by mouth daily.   carvedilol 6.25 MG tablet Commonly known as: COREG Take 1 tablet (6.25 mg total) by mouth 2 (two) times daily with a meal.   cholecalciferol 25 MCG (1000 UNIT) tablet Commonly known as: VITAMIN D3 Take 1,000 Units by mouth daily.   clopidogrel 75 MG tablet Commonly known as: PLAVIX Take 1 tablet (75 mg total) by mouth daily.   furosemide 20 MG tablet Commonly known as: LASIX Take 1 tablet (20 mg total) by mouth every Monday, Wednesday, and Friday.   metFORMIN 500 MG 24 hr tablet Commonly known as: GLUCOPHAGE-XR Take 500 mg by mouth in the morning and 1000 mg at hight.   multivitamin with minerals Tabs tablet Take 1 tablet by mouth daily.   polyethylene glycol 17 g packet Commonly known as: MiraLax Take 17 g by mouth daily.   potassium chloride 10 MEQ tablet Commonly known as: KLOR-CON Take 1 tablet (10 mEq total) by mouth every Monday, Wednesday, and Friday.               Durable Medical Equipment  (From admission, onward)           Start     Ordered   02/13/23 1031  For home use only DME Walker rolling  Once       Question Answer Comment  Walker: With 5 Inch Wheels   Patient needs a walker to treat with the following condition Weakness      02/13/23 1030             Follow-up Information     Albina Billet, MD Follow up in 1 week(s).   Specialty: Internal Medicine Contact information: [address] [place] [address] [phone]                 Allergies  Allergen Reactions   Latex Hives and Rash     Other reaction(s): Other (See Comments)   Lidocaine Hives   Phenobarbital-Belladonna Alk     Other reaction(s): Hives & Rash   Phenobarbital-Belladonna Alk Hives and Rash     The results of significant diagnostics from this hospitalization (including imaging, microbiology, ancillary and laboratory) are listed below for reference.   Consultations:   Procedures/Studies: PERIPHERAL VASCULAR CATHETERIZATION  Result Date: 02/12/2023 See surgical note for result.  ECHOCARDIOGRAM COMPLETE BUBBLE STUDY  Result Date: 02/11/2023    ECHOCARDIOGRAM REPORT   Patient Name:   Destiny Savage Date of Exam: 02/11/2023 Medical Rec #:  [id number]       Height:       66.0 in Accession #:    [id number]  Weight:       197.8 lb Date of Birth:  January 18, 1951       BSA:          1.990 m Patient Age:    42 years        BP:           174/75 mmHg Patient Gender: F               HR:           64 bpm. Exam Location:  [facility] Procedure: 2D Echo, Cardiac Doppler, Color Doppler and Saline Contrast Bubble            Study Indications:     Stroke 434.91 / I63.9  History:         Patient has prior history of Echocardiogram examinations, most                  recent 01/12/2019. Stroke; Risk Factors:Hypertension and                  Diabetes.  Sonographer:     Sherrie Sport Referring Phys:  [id number] [place] Diagnosing Phys: Kathlyn Sacramento MD IMPRESSIONS  1. Left ventricular ejection fraction, by estimation, is 65 to 70%. The left ventricle has normal function. The left ventricle has no regional wall motion abnormalities. Left ventricular diastolic parameters are consistent with Grade I diastolic dysfunction (impaired relaxation).  2. Right ventricular systolic function is normal. The right ventricular size is normal. There is normal pulmonary artery systolic pressure.  3. The mitral valve is normal in structure. Mild mitral valve regurgitation. No evidence of mitral stenosis.  4. The aortic valve is normal in structure. Aortic valve  regurgitation is not visualized. No aortic stenosis is present.  5. The inferior vena cava is normal in size with greater than 50% respiratory variability, suggesting right atrial pressure of 3 mmHg.  6. Agitated saline contrast bubble study was negative, with no evidence of any interatrial shunt. FINDINGS  Left Ventricle: Left ventricular ejection fraction, by estimation, is 65 to 70%. The left ventricle has normal function. The left ventricle has no regional wall motion abnormalities. The left ventricular internal cavity size was normal in size. There is  no left ventricular hypertrophy. Left ventricular diastolic parameters are consistent with Grade I diastolic dysfunction (impaired relaxation). Right Ventricle: The right ventricular size is normal. No increase in right ventricular wall thickness. Right ventricular systolic function is normal. There is normal pulmonary artery systolic pressure. The tricuspid regurgitant velocity is 2.18 m/s, and  with an assumed right atrial pressure of 3 mmHg, the estimated right ventricular systolic pressure is [id number] mmHg. Left Atrium: Left atrial size was normal in size. Right Atrium: Right atrial size was normal in size. Pericardium: There is no evidence of pericardial effusion. Mitral Valve: The mitral valve is normal in structure. There is mild thickening of the mitral valve leaflet(s). Mild mitral valve regurgitation. No evidence of mitral valve stenosis. MV peak gradient, 5.7 mmHg. The mean mitral valve gradient is 2.0 mmHg. Tricuspid Valve: The tricuspid valve is normal in structure. Tricuspid valve regurgitation is trivial. No evidence of tricuspid stenosis. Aortic Valve: The aortic valve is normal in structure. Aortic valve regurgitation is not visualized. No aortic stenosis is present. Aortic valve mean gradient measures 3.0 mmHg. Aortic valve peak gradient measures 6.2 mmHg. Aortic valve area, by VTI measures 2.27 cm. Pulmonic Valve: The pulmonic valve was normal in  structure. Pulmonic valve regurgitation is  not visualized. No evidence of pulmonic stenosis. Aorta: The aortic root is normal in size and structure. Venous: The inferior vena cava is normal in size with greater than 50% respiratory variability, suggesting right atrial pressure of 3 mmHg. IAS/Shunts: No atrial level shunt detected by color flow Doppler. Agitated saline contrast was given intravenously to evaluate for intracardiac shunting. Agitated saline contrast bubble study was negative, with no evidence of any interatrial shunt.  LEFT VENTRICLE PLAX 2D LVIDd:         4.40 cm   Diastology LVIDs:         2.50 cm   LV e' medial:    7.18 cm/s LV PW:         1.00 cm   LV E/e' medial:  8.2 LV IVS:        0.90 cm   LV e' lateral:   9.25 cm/s LVOT diam:     2.00 cm   LV E/e' lateral: 6.4 LV SV:         62 LV SV Index:   31 LVOT Area:     3.14 cm  RIGHT VENTRICLE RV Basal diam:  3.10 cm RV Mid diam:    2.60 cm RV S prime:     8.49 cm/s TAPSE (M-mode): 2.3 cm LEFT ATRIUM             Index        RIGHT ATRIUM           Index LA diam:        2.50 cm 1.26 cm/m   RA Area:     14.00 cm LA Vol (A2C):   31.1 ml 15.63 ml/m  RA Volume:   33.20 ml  16.68 ml/m LA Vol (A4C):   27.4 ml 13.77 ml/m LA Biplane Vol: 30.8 ml 15.48 ml/m  AORTIC VALVE AV Area (Vmax):    2.18 cm AV Area (Vmean):   2.07 cm AV Area (VTI):     2.27 cm AV Vmax:           124.50 cm/s AV Vmean:          83.400 cm/s AV VTI:            0.272 m AV Peak Grad:      6.2 mmHg AV Mean Grad:      3.0 mmHg LVOT Vmax:         86.50 cm/s LVOT Vmean:        55.000 cm/s LVOT VTI:          0.196 m LVOT/AV VTI ratio: 0.72  AORTA Ao Root diam: 2.70 cm MITRAL VALVE               TRICUSPID VALVE MV Area (PHT): 2.66 cm    TR Peak grad:   19.0 mmHg MV Area VTI:   1.83 cm    TR Vmax:        218.00 cm/s MV Peak grad:  5.7 mmHg MV Mean grad:  2.0 mmHg    SHUNTS MV Vmax:       1.19 m/s    Systemic VTI:  0.20 m MV Vmean:      69.0 cm/s   Systemic Diam: 2.00 cm MV Decel Time: 285  msec MV E velocity: 59.20 cm/s MV A velocity: 84.90 cm/s MV E/A ratio:  0.70 Kathlyn Sacramento MD Electronically signed by Kathlyn Sacramento MD Signature Date/Time: 02/11/2023/11:03:24 AM    Final    MR BRAIN WO CONTRAST  Result Date: 02/11/2023 CLINICAL DATA:  Acute neurologic deficit EXAM: MRI HEAD WITHOUT CONTRAST TECHNIQUE: Multiplanar, multiecho pulse sequences of the brain and surrounding structures were obtained without intravenous contrast. COMPARISON:  07/28/2019 FINDINGS: Brain: No acute infarct, mass effect or extra-axial collection. No chronic microhemorrhage or siderosis. Normal white matter signal, parenchymal volume and CSF spaces. The midline structures are normal. Vascular: Normal flow voids. Skull and upper cervical spine: Normal marrow signal. Sinuses/Orbits: Negative. Other: None. IMPRESSION: Normal MRI of the brain. Electronically Signed   By: Ulyses Jarred M.D.   On: 02/11/2023 00:05   CT ANGIO HEAD W OR WO CONTRAST  Result Date: 02/10/2023 CLINICAL DATA:  Blurry vision EXAM: CT ANGIOGRAPHY HEAD AND NECK CT PERFUSION BRAIN TECHNIQUE: Multidetector CT imaging of the head and neck was performed using the standard protocol during bolus administration of intravenous contrast. Multiplanar CT image reconstructions and MIPs were obtained to evaluate the vascular anatomy. Carotid stenosis measurements (when applicable) are obtained utilizing NASCET criteria, using the distal internal carotid diameter as the denominator. Multiphase CT imaging of the brain was performed following IV bolus contrast injection. Subsequent parametric perfusion maps were calculated using RAPID software. RADIATION DOSE REDUCTION: This exam was performed according to the departmental dose-optimization program which includes automated exposure control, adjustment of the mA and/or kV according to patient size and/or use of iterative reconstruction technique. CONTRAST:  111mL OMNIPAQUE IOHEXOL 350 MG/ML SOLN COMPARISON:  None  Available. FINDINGS: CTA NECK FINDINGS SKELETON: There is no bony spinal canal stenosis. No lytic or blastic lesion. OTHER NECK: Normal pharynx, larynx and major salivary glands. No cervical lymphadenopathy. Unremarkable thyroid gland. UPPER CHEST: No pneumothorax or pleural effusion. No nodules or masses. AORTIC ARCH: There is calcific atherosclerosis of the aortic arch. There is no aneurysm, dissection or hemodynamically significant stenosis of the visualized portion of the aorta. Conventional 3 vessel aortic branching pattern. The visualized proximal subclavian arteries are widely patent. RIGHT CAROTID SYSTEM: No dissection, occlusion or aneurysm. There is mixed density atherosclerosis extending into the proximal ICA, resulting in less than 50% stenosis. LEFT CAROTID SYSTEM: No dissection, occlusion or aneurysm. There is calcified atherosclerosis extending into the proximal ICA, resulting in approximately 70% stenosis. VERTEBRAL ARTERIES: Left dominant configuration. Both origins are clearly patent. There is no dissection, occlusion or flow-limiting stenosis to the skull base (V1-V3 segments). CTA HEAD FINDINGS POSTERIOR CIRCULATION: --Vertebral arteries: Normal V4 segments. --Inferior cerebellar arteries: Normal. --Basilar artery: Normal. --Superior cerebellar arteries: Normal. --Posterior cerebral arteries (PCA): Normal. ANTERIOR CIRCULATION: --Intracranial internal carotid arteries: Normal. --Anterior cerebral arteries (ACA): Normal. Both A1 segments are present. Patent anterior communicating artery (a-comm). --Middle cerebral arteries (MCA): Normal. VENOUS SINUSES: As permitted by contrast timing, patent. ANATOMIC VARIANTS: Fetal origins of both posterior cerebral arteries. Review of the MIP images confirms the above findings. CT Brain Perfusion Findings: ASPECTS: 10 CBF (<30%) Volume: 59mL Perfusion (Tmax>6.0s) volume: 24mL Mismatch Volume: 69mL Infarction Location:None IMPRESSION: 1. No emergent large vessel  occlusion or high-grade stenosis of the intracranial arteries. 2. 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 Less than 50% stenosis on the right. 3. Normal CT perfusion. Electronically Signed   By: Ulyses Jarred M.D.   On: 02/10/2023 19:38   CT ANGIO NECK W OR WO CONTRAST  Result Date: 02/10/2023 CLINICAL DATA:  Blurry vision EXAM: CT ANGIOGRAPHY HEAD AND NECK CT PERFUSION BRAIN TECHNIQUE: Multidetector CT imaging of the head and neck was performed using the standard protocol during bolus administration of intravenous  contrast. Multiplanar CT image reconstructions and MIPs were obtained to evaluate the vascular anatomy. Carotid stenosis measurements (when applicable) are obtained utilizing NASCET criteria, using the distal internal carotid diameter as the denominator. Multiphase CT imaging of the brain was performed following IV bolus contrast injection. Subsequent parametric perfusion maps were calculated using RAPID software. RADIATION DOSE REDUCTION: This exam was performed according to the departmental dose-optimization program which includes automated exposure control, adjustment of the mA and/or kV according to patient size and/or use of iterative reconstruction technique. CONTRAST:  128mL OMNIPAQUE IOHEXOL 350 MG/ML SOLN COMPARISON:  None Available. FINDINGS: CTA NECK FINDINGS SKELETON: There is no bony spinal canal stenosis. No lytic or blastic lesion. OTHER NECK: Normal pharynx, larynx and major salivary glands. No cervical lymphadenopathy. Unremarkable thyroid gland. UPPER CHEST: No pneumothorax or pleural effusion. No nodules or masses. AORTIC ARCH: There is calcific atherosclerosis of the aortic arch. There is no aneurysm, dissection or hemodynamically significant stenosis of the visualized portion of the aorta. Conventional 3 vessel aortic branching pattern. The visualized proximal subclavian arteries are widely patent. RIGHT  CAROTID SYSTEM: No dissection, occlusion or aneurysm. There is mixed density atherosclerosis extending into the proximal ICA, resulting in less than 50% stenosis. LEFT CAROTID SYSTEM: No dissection, occlusion or aneurysm. There is calcified atherosclerosis extending into the proximal ICA, resulting in approximately 70% stenosis. VERTEBRAL ARTERIES: Left dominant configuration. Both origins are clearly patent. There is no dissection, occlusion or flow-limiting stenosis to the skull base (V1-V3 segments). CTA HEAD FINDINGS POSTERIOR CIRCULATION: --Vertebral arteries: Normal V4 segments. --Inferior cerebellar arteries: Normal. --Basilar artery: Normal. --Superior cerebellar arteries: Normal. --Posterior cerebral arteries (PCA): Normal. ANTERIOR CIRCULATION: --Intracranial internal carotid arteries: Normal. --Anterior cerebral arteries (ACA): Normal. Both A1 segments are present. Patent anterior communicating artery (a-comm). --Middle cerebral arteries (MCA): Normal. VENOUS SINUSES: As permitted by contrast timing, patent. ANATOMIC VARIANTS: Fetal origins of both posterior cerebral arteries. Review of the MIP images confirms the above findings. CT Brain Perfusion Findings: ASPECTS: 10 CBF (<30%) Volume: 80mL Perfusion (Tmax>6.0s) volume: 41mL Mismatch Volume: 56mL Infarction Location:None IMPRESSION: 1. No emergent large vessel occlusion or high-grade stenosis of the intracranial arteries. 2. 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 Less than 50% stenosis on the right. 3. Normal CT perfusion. Electronically Signed   By: Ulyses Jarred M.D.   On: 02/10/2023 19:38   CT CEREBRAL PERFUSION W CONTRAST  Result Date: 02/10/2023 CLINICAL DATA:  Blurry vision EXAM: CT ANGIOGRAPHY HEAD AND NECK CT PERFUSION BRAIN TECHNIQUE: Multidetector CT imaging of the head and neck was performed using the standard protocol during bolus administration of intravenous  contrast. Multiplanar CT image reconstructions and MIPs were obtained to evaluate the vascular anatomy. Carotid stenosis measurements (when applicable) are obtained utilizing NASCET criteria, using the distal internal carotid diameter as the denominator. Multiphase CT imaging of the brain was performed following IV bolus contrast injection. Subsequent parametric perfusion maps were calculated using RAPID software. RADIATION DOSE REDUCTION: This exam was performed according to the departmental dose-optimization program which includes automated exposure control, adjustment of the mA and/or kV according to patient size and/or use of iterative reconstruction technique. CONTRAST:  156mL OMNIPAQUE IOHEXOL 350 MG/ML SOLN COMPARISON:  None Available. FINDINGS: CTA NECK FINDINGS SKELETON: There is no bony spinal canal stenosis. No lytic or blastic lesion. OTHER NECK: Normal pharynx, larynx and major salivary glands. No cervical lymphadenopathy. Unremarkable thyroid gland. UPPER CHEST: No pneumothorax or pleural effusion. No nodules or masses. AORTIC ARCH: There  is calcific atherosclerosis of the aortic arch. There is no aneurysm, dissection or hemodynamically significant stenosis of the visualized portion of the aorta. Conventional 3 vessel aortic branching pattern. The visualized proximal subclavian arteries are widely patent. RIGHT CAROTID SYSTEM: No dissection, occlusion or aneurysm. There is mixed density atherosclerosis extending into the proximal ICA, resulting in less than 50% stenosis. LEFT CAROTID SYSTEM: No dissection, occlusion or aneurysm. There is calcified atherosclerosis extending into the proximal ICA, resulting in approximately 70% stenosis. VERTEBRAL ARTERIES: Left dominant configuration. Both origins are clearly patent. There is no dissection, occlusion or flow-limiting stenosis to the skull base (V1-V3 segments). CTA HEAD FINDINGS POSTERIOR CIRCULATION: --Vertebral arteries: Normal V4 segments. --Inferior  cerebellar arteries: Normal. --Basilar artery: Normal. --Superior cerebellar arteries: Normal. --Posterior cerebral arteries (PCA): Normal. ANTERIOR CIRCULATION: --Intracranial internal carotid arteries: Normal. --Anterior cerebral arteries (ACA): Normal. Both A1 segments are present. Patent anterior communicating artery (a-comm). --Middle cerebral arteries (MCA): Normal. VENOUS SINUSES: As permitted by contrast timing, patent. ANATOMIC VARIANTS: Fetal origins of both posterior cerebral arteries. Review of the MIP images confirms the above findings. CT Brain Perfusion Findings: ASPECTS: 10 CBF (<30%) Volume: 96mL Perfusion (Tmax>6.0s) volume: 6mL Mismatch Volume: 5mL Infarction Location:None IMPRESSION: 1. No emergent large vessel occlusion or high-grade stenosis of the intracranial arteries. 2. 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 Less than 50% stenosis on the right. 3. Normal CT perfusion. Electronically Signed   By: Ulyses Jarred M.D.   On: 02/10/2023 19:38   CT HEAD CODE STROKE WO CONTRAST  Result Date: 02/10/2023 CLINICAL DATA:  Code stroke. Neuro deficit, acute, stroke suspected. EXAM: CT HEAD WITHOUT CONTRAST TECHNIQUE: Contiguous axial images were obtained from the base of the skull through the vertex without intravenous contrast. RADIATION DOSE REDUCTION: This exam was performed according to the departmental dose-optimization program which includes automated exposure control, adjustment of the mA and/or kV according to patient size and/or use of iterative reconstruction technique. COMPARISON:  Head CT 05/18/2022. FINDINGS: Brain: No acute hemorrhage, mass effect or midline shift. Gray-white differentiation is preserved. No hydrocephalus. No extra-axial collection. Basilar cisterns are patent. Vascular: No hyperdense vessel or unexpected calcification. Skull: No calvarial fracture or suspicious bone lesion. Skull base is  unremarkable. Sinuses/Orbits: Unremarkable. Other: None. ASPECTS ([place] Stroke Program Early CT Score) - Ganglionic level infarction (caudate, lentiform nuclei, internal capsule, insula, M1-M3 cortex): 7 - Supraganglionic infarction (M4-M6 cortex): 3 Total score (0-10 with 10 being normal): 10 IMPRESSION: No acute intracranial hemorrhage or evidence of evolving large vessel territory infarct. ASPECT score is 10. Code stroke imaging results were communicated on 02/10/2023 at 6:18 pm to provider Dr. Starleen Blue via telephone, who verbally acknowledged these results. Electronically Signed   By: Emmit Alexanders M.D.   On: 02/10/2023 18:18   DG Lumbar Spine 2-3 Views  Result Date: 02/04/2023 CLINICAL DATA:  Fall, back pain EXAM: LUMBAR SPINE - 2-3 VIEW COMPARISON:  01/25/2023 FINDINGS: Normal lumbar lordosis. No acute fracture or listhesis of the lumbar spine. Vertebral body height is preserved. Minimal disc space narrowing at L3-4 and L4-5 is in keeping with minimal degenerative disc disease at these levels, unchanged. Paraspinal soft tissues are unremarkable. IMPRESSION: 1. No acute fracture or listhesis. Minimal degenerative disc disease. Electronically Signed   By: Fidela Salisbury M.D.   On: 02/04/2023 23:36   MR LUMBAR SPINE WO CONTRAST  Result Date: 02/04/2023 CLINICAL DATA:  Lumbar radiculopathy. Lower back pain with bilateral leg pain and weakness times 2-3 weeks.  EXAM: MRI LUMBAR SPINE WITHOUT CONTRAST TECHNIQUE: Multiplanar, multisequence MR imaging of the lumbar spine was performed. No intravenous contrast was administered. COMPARISON:  Lumbar spine MRI 01/11/2017. FINDINGS: Segmentation: Conventional numbering is assumed with 5 non-rib-bearing, lumbar type vertebral bodies. Alignment:  Normal. Vertebrae: Normal vertebral body heights. Mild Modic type 1 degenerative endplate marrow signal changes at L4-5. Conus medullaris and cauda equina: Conus extends to the L1 level. Conus and cauda equina appear normal.  Paraspinal and other soft tissues: Unremarkable. Disc levels: T12-L1:  Normal. L1-L2:  Normal. L2-L3:  Normal. L3-L4: Small disc bulge and mild bilateral facet arthropathy. No spinal canal stenosis or neural foraminal narrowing. L4-L5: Disc bulge with superimposed right subarticular disc extrusion compresses the traversing right L5 nerve root in the lateral recess. Mild bilateral facet arthropathy without significant neural foraminal narrowing. L5-S1: Right eccentric disc bulge and right-sided facet arthropathy results in mild right neural foraminal narrowing. No spinal canal stenosis. IMPRESSION: At L4-L5, a right subarticular disc extrusion compresses the traversing right L5 nerve root in the lateral recess. Electronically Signed   By: Emmit Alexanders M.D.   On: 02/04/2023 14:24   DG Thoracic Spine 2 View  Result Date: 01/25/2023 CLINICAL DATA:  Pain. EXAM: THORACIC SPINE 2 VIEWS; LUMBAR SPINE - COMPLETE 4+ VIEW COMPARISON:  None Available. FINDINGS: Two views of the thoracic spine and 5 views of the lumbar spine. No evidence of thoracic or lumbar spine fracture. Normal alignment. Mild degenerative changes of the midthoracic spine. IMPRESSION: No evidence of thoracic or lumbar spine fracture or traumatic malalignment. Electronically Signed   By: Emmit Alexanders M.D.   On: 01/25/2023 15:12   DG Lumbar Spine Complete  Result Date: 01/25/2023 CLINICAL DATA:  Pain. EXAM: THORACIC SPINE 2 VIEWS; LUMBAR SPINE - COMPLETE 4+ VIEW COMPARISON:  None Available. FINDINGS: Two views of the thoracic spine and 5 views of the lumbar spine. No evidence of thoracic or lumbar spine fracture. Normal alignment. Mild degenerative changes of the midthoracic spine. IMPRESSION: No evidence of thoracic or lumbar spine fracture or traumatic malalignment. Electronically Signed   By: Emmit Alexanders M.D.   On: 01/25/2023 15:12      Labs: BNP (last 3 results) No results for input(s): "BNP" in the last 8760 hours. Basic Metabolic  Panel: Recent Labs  Lab 02/10/23 1803 02/11/23 0439 02/12/23 0409 02/13/23 0504  NA 137 134* 136 139  K 3.3* 3.8 4.0 4.1  CL 101 101 104 106  CO2 25 28 26 24   GLUCOSE 133* 158* 164* 127*  BUN 9 9 16 15   CREATININE 0.84 0.77 0.85 0.84  CALCIUM 9.4 8.9 9.5 9.0  MG  --   --   --  1.7   Liver Function Tests: Recent Labs  Lab 02/10/23 1803  AST 22  ALT 11  ALKPHOS 63  BILITOT 0.6  PROT 7.0  ALBUMIN 3.9   No results for input(s): "LIPASE", "AMYLASE" in the last 168 hours. No results for input(s): "AMMONIA" in the last 168 hours. CBC: Recent Labs  Lab 02/10/23 1803 02/11/23 0439 02/12/23 0409 02/13/23 0504  WBC 9.2 9.6 8.7 10.8*  NEUTROABS 5.1  --   --   --   HGB 11.4* 10.9* 11.3* 10.2*  HCT 36.1 34.0* 34.9* 31.4*  MCV 94.5 92.9 92.8 93.5  PLT 258 242 242 250   Cardiac Enzymes: No results for input(s): "CKTOTAL", "CKMB", "CKMBINDEX", "TROPONINI" in the last 168 hours. BNP: Invalid input(s): "POCBNP" CBG: Recent Labs  Lab 02/12/23 1410 02/12/23 1729  02/12/23 1814 02/12/23 2134 02/13/23 0744  GLUCAP 149* 134* 116* 163* 107*   D-Dimer No results for input(s): "DDIMER" in the last 72 hours. Hgb A1c Recent Labs    02/10/23 1805  HGBA1C 7.0*   Lipid Profile Recent Labs    02/11/23 0439  CHOL 143  HDL 60  LDLCALC 71  TRIG 59  CHOLHDL 2.4   Thyroid function studies No results for input(s): "TSH", "T4TOTAL", "T3FREE", "THYROIDAB" in the last 72 hours.  Invalid input(s): "FREET3" Anemia work up No results for input(s): "VITAMINB12", "FOLATE", "FERRITIN", "TIBC", "IRON", "RETICCTPCT" in the last 72 hours. Urinalysis    Component Value Date/Time   COLORURINE STRAW (A) 02/10/2023 1834   APPEARANCEUR CLEAR (A) 02/10/2023 1834   APPEARANCEUR Clear 02/19/2015 1647   LABSPEC 1.003 (L) 02/10/2023 1834   LABSPEC 1.025 02/19/2015 1647   PHURINE 6.0 02/10/2023 1834   GLUCOSEU NEGATIVE 02/10/2023 1834   GLUCOSEU Negative 02/19/2015 1647   HGBUR NEGATIVE  02/10/2023 1834   BILIRUBINUR NEGATIVE 02/10/2023 1834   BILIRUBINUR Negative 02/19/2015 [place] 02/10/2023 1834   PROTEINUR NEGATIVE 02/10/2023 1834   NITRITE NEGATIVE 02/10/2023 1834   LEUKOCYTESUR NEGATIVE 02/10/2023 1834   LEUKOCYTESUR Trace 02/19/2015 1647   Sepsis Labs Recent Labs  Lab 02/10/23 1803 02/11/23 0439 02/12/23 0409 02/13/23 0504  WBC 9.2 9.6 8.7 10.8*   Microbiology No results found for this or any previous visit (from the past 240 hour(s)).   Total time spend on discharging this patient, including the last patient exam, discussing the hospital stay, instructions for ongoing care as it relates to all pertinent caregivers, as well as preparing the medical discharge records, prescriptions, and/or referrals as applicable, is 35 minutes.    Enzo Bi, MD  [facility] 02/13/2023, 10:35 AM

## 2023-02-14 NOTE — Op Note (Signed)
OPERATIVE NOTE DATE: 02/12/2023  PROCEDURE:  Ultrasound guidance for vascular access right femoral artery  Placement of a 9 x 7 exact stent with the use of the NAV-6 embolic protection device in the left carotid artery  PRE-OPERATIVE DIAGNOSIS: 1.  Symptomatic left carotid artery stenosis. 2.  TIA with dysarthria  POST-OPERATIVE DIAGNOSIS:  Same as above  SURGEON: Hortencia Pilar  ASSISTANT(S): None  ANESTHESIA: local/MCS  ESTIMATED BLOOD LOSS: 50 cc  CONTRAST: 45 cc  FLUORO TIME: 12 minutes  MODERATE CONSCIOUS SEDATION TIME: Continuous ECG pulse oximetry and cardiopulmonary monitoring was performed throughout the entire procedure by the interventional radiology nurse total sedation time was 54 minutes  FINDING(S): 1.   75% left internal carotid artery stenosis  SPECIMEN(S):   none  INDICATIONS:   Patient is a 72 y.o. female who presents with symptomatic left carotid artery stenosis.  The patient has multiple cardiopulmonary comorbidities as well as a very high bifurcation and carotid artery stenting was felt to be preferred to endarterectomy for that reason.  Risks and benefits were discussed and informed consent was obtained.   DESCRIPTION: After obtaining full informed written consent, the patient was brought back to the vascular suite and placed supine upon the table.  The patient received IV antibiotics prior to induction. Moderate conscious sedation was administered during a face to face encounter with the patient throughout the procedure with my supervision of the RN administering medicines and monitoring the patients vital signs and mental status throughout from the start of the procedure until the patient was taken to the recovery room.    After obtaining adequate sedation, the patient was prepped and draped in the standard fashion.    A first assistant is required in order to allow for a safe and more efficient operation.  Duties include wire manipulations as  well as assistance with pinning the sheath and positioning the detector for proper angle, assistance and deploying the stent in the proper position and appropriate images.  Further duties include assisting with patient positioning during the procedure.  I believe that this procedure requires a first assistant in order for it to be performed at a level in keeping with the high standards of this institution.  The right femoral artery was visualized with ultrasound and found to be widely patent. It was then accessed under direct ultrasound guidance without difficulty with a micropuncture needle. A permanent image was recorded.  A microwire was then advanced without difficulty under fluoroscopic guidance followed by a micro-sheath.  A J-wire was placed and we then placed a 6 French sheath. The patient was then heparinized and a total of 9000 units of intravenous heparin were given and an ACT was checked to confirm successful anticoagulation.   A pigtail catheter was then placed into the ascending aorta. This showed a bovine arch type III. The left common carotid artery was then selectively cannulated without difficulty with a Simmons 1 catheter and the catheter advanced into the mid left common carotid artery after the Glidewire was exchanged for a supra core wire.  Cervical and cerebral carotid angiography was then performed. There were no obvious intracranial filling defects. The carotid bifurcation demonstrated moderately calcified 75% stenosis involving the distal bulb and origin of the left internal carotid artery.  I then advanced into the external carotid artery with a Glidewire and the Simmons 1 catheter and then exchanged for the Amplatz Super Stiff wire. Over the Amplatz Super Stiff wire, a 6 [place] shuttle sheath was placed into  the mid common carotid artery. I then used the NAV-6  Embolic protection device and crossed the lesion and parked this in the distal internal carotid artery at the base of the  skull.  I then selected a 9 x 7 exact stent. This was deployed across the lesion encompassing it in its entirety. A 4.5 x 30 length balloon was used to post dilate the stent. Only about a 25-30% residual stenosis was present after angioplasty. Completion angiogram showed normal intracranial filling without new defects. At this point I elected to terminate the procedure. The sheath was removed and StarClose closure device was deployed in the right femoral artery with excellent hemostatic result. The patient was taken to the recovery room in stable condition having tolerated the procedure well.  COMPLICATIONS: none  CONDITION: stable  Hortencia Pilar 02/14/2023 10:27 AM   This note was created with Dragon Medical transcription system. Any errors in dictation are purely unintentional.

## 2023-02-27 ENCOUNTER — Other Ambulatory Visit: Payer: Self-pay

## 2023-02-27 ENCOUNTER — Emergency Department: Payer: Medicare Other

## 2023-02-27 ENCOUNTER — Inpatient Hospital Stay
Admission: EM | Admit: 2023-02-27 | Discharge: 2023-03-01 | DRG: 312 | Disposition: A | Discharge status: Home / Self Care | Payer: Medicare Other | Attending: Family Medicine | Admitting: Family Medicine

## 2023-02-27 DIAGNOSIS — Z79899 Other long term (current) drug therapy: Secondary | ICD-10-CM

## 2023-02-27 DIAGNOSIS — I6522 Occlusion and stenosis of left carotid artery: Secondary | ICD-10-CM | POA: Diagnosis present

## 2023-02-27 DIAGNOSIS — R0602 Shortness of breath: Secondary | ICD-10-CM

## 2023-02-27 DIAGNOSIS — E785 Hyperlipidemia, unspecified: Secondary | ICD-10-CM | POA: Diagnosis present

## 2023-02-27 DIAGNOSIS — K219 Gastro-esophageal reflux disease without esophagitis: Secondary | ICD-10-CM | POA: Diagnosis present

## 2023-02-27 DIAGNOSIS — Z91040 Latex allergy status: Secondary | ICD-10-CM

## 2023-02-27 DIAGNOSIS — E876 Hypokalemia: Secondary | ICD-10-CM | POA: Diagnosis present

## 2023-02-27 DIAGNOSIS — Z888 Allergy status to other drugs, medicaments and biological substances status: Secondary | ICD-10-CM

## 2023-02-27 DIAGNOSIS — Z884 Allergy status to anesthetic agent status: Secondary | ICD-10-CM

## 2023-02-27 DIAGNOSIS — I10 Essential (primary) hypertension: Secondary | ICD-10-CM | POA: Diagnosis present

## 2023-02-27 DIAGNOSIS — G4733 Obstructive sleep apnea (adult) (pediatric): Secondary | ICD-10-CM | POA: Diagnosis present

## 2023-02-27 DIAGNOSIS — K5900 Constipation, unspecified: Secondary | ICD-10-CM | POA: Diagnosis present

## 2023-02-27 DIAGNOSIS — R55 Syncope and collapse: Secondary | ICD-10-CM | POA: Diagnosis not present

## 2023-02-27 DIAGNOSIS — M1990 Unspecified osteoarthritis, unspecified site: Secondary | ICD-10-CM | POA: Diagnosis present

## 2023-02-27 DIAGNOSIS — J45909 Unspecified asthma, uncomplicated: Secondary | ICD-10-CM | POA: Diagnosis present

## 2023-02-27 DIAGNOSIS — E119 Type 2 diabetes mellitus without complications: Secondary | ICD-10-CM

## 2023-02-27 DIAGNOSIS — Z90710 Acquired absence of both cervix and uterus: Secondary | ICD-10-CM

## 2023-02-27 DIAGNOSIS — Z7902 Long term (current) use of antithrombotics/antiplatelets: Secondary | ICD-10-CM

## 2023-02-27 DIAGNOSIS — R7989 Other specified abnormal findings of blood chemistry: Secondary | ICD-10-CM

## 2023-02-27 DIAGNOSIS — R252 Cramp and spasm: Secondary | ICD-10-CM | POA: Diagnosis present

## 2023-02-27 DIAGNOSIS — Z7984 Long term (current) use of oral hypoglycemic drugs: Secondary | ICD-10-CM

## 2023-02-27 DIAGNOSIS — Z8673 Personal history of transient ischemic attack (TIA), and cerebral infarction without residual deficits: Secondary | ICD-10-CM

## 2023-02-27 DIAGNOSIS — R42 Dizziness and giddiness: Secondary | ICD-10-CM | POA: Diagnosis not present

## 2023-02-27 DIAGNOSIS — Z8249 Family history of ischemic heart disease and other diseases of the circulatory system: Secondary | ICD-10-CM

## 2023-02-27 DIAGNOSIS — I2489 Other forms of acute ischemic heart disease: Secondary | ICD-10-CM | POA: Diagnosis present

## 2023-02-27 DIAGNOSIS — Z9049 Acquired absence of other specified parts of digestive tract: Secondary | ICD-10-CM

## 2023-02-27 DIAGNOSIS — I2510 Atherosclerotic heart disease of native coronary artery without angina pectoris: Secondary | ICD-10-CM | POA: Diagnosis present

## 2023-02-27 DIAGNOSIS — Z7982 Long term (current) use of aspirin: Secondary | ICD-10-CM

## 2023-02-27 DIAGNOSIS — Z95820 Peripheral vascular angioplasty status with implants and grafts: Secondary | ICD-10-CM

## 2023-02-27 DIAGNOSIS — I214 Non-ST elevation (NSTEMI) myocardial infarction: Principal | ICD-10-CM

## 2023-02-27 LAB — CBC
HCT: 32.1 % — ABNORMAL LOW (ref 36.0–46.0)
Hemoglobin: 10.4 g/dL — ABNORMAL LOW (ref 12.0–15.0)
MCH: 30.6 pg (ref 26.0–34.0)
MCHC: 32.4 g/dL (ref 30.0–36.0)
MCV: 94.4 fL (ref 80.0–100.0)
Platelets: 342 10*3/uL (ref 150–400)
RBC: 3.4 MIL/uL — ABNORMAL LOW (ref 3.87–5.11)
RDW: 13.8 % (ref 11.5–15.5)
WBC: 8.8 10*3/uL (ref 4.0–10.5)
nRBC: 0 % (ref 0.0–0.2)

## 2023-02-27 LAB — BASIC METABOLIC PANEL
Anion gap: 12 (ref 5–15)
BUN: 12 mg/dL (ref 8–23)
CO2: 21 mmol/L — ABNORMAL LOW (ref 22–32)
Calcium: 9.4 mg/dL (ref 8.9–10.3)
Chloride: 100 mmol/L (ref 98–111)
Creatinine, Ser: 0.75 mg/dL (ref 0.44–1.00)
GFR, Estimated: >60 mL/min (ref >60)
Glucose, Bld: 114 mg/dL — ABNORMAL HIGH (ref 70–99)
Potassium: 3.2 mmol/L — ABNORMAL LOW (ref 3.5–5.1)
Sodium: 133 mmol/L — ABNORMAL LOW (ref 135–145)

## 2023-02-27 LAB — TROPONIN I (HIGH SENSITIVITY): Troponin I (High Sensitivity): 12 ng/L (ref <18)

## 2023-02-27 NOTE — ED Provider Notes (Signed)
[facility] Provider Note    Event Date/Time   First MD Initiated Contact with Patient 02/27/23 2301     (approximate)   History   Shortness of Breath   HPI  Destiny Savage is a 72 y.o. female who presents to the ED for evaluation of Shortness of Breath   Review of medical DC summary from 3/28.  History of DM, HTN, HLD and CVA who was admitted for TIA.  She had transient left-sided numbness, expressive aphasia and dysarthria.  Normal MRI brain.  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  DAPT on aspirin and Plavix.  No anticoagulation.  Patient presents to the ED for evaluation of intermittent "feeling weird," dizziness, "feeling like something is off."  He is a hard time describing her symptoms but reports a handful of episodic symptoms while feeling "normal" in between over the past couple days.  She reports the symptoms are similar as to when she was admitted last week for her TIA.  She reports occasionally feeling bitemporal head pressure alongside these symptoms, but no other pain and no persistent pain.  She reports that sometimes when she feels the symptoms she feels short of breath, but minimizes this.  Denies any cough, dyspnea on on exertion, chest pain.   Physical Exam   Triage Vital Signs: ED Triage Vitals  Enc Vitals Group     BP 02/27/23 1816 (!) 154/73     Pulse Rate 02/27/23 1816 70     Resp 02/27/23 1816 20     Temp 02/27/23 1816 98.3 F (36.8 C)     Temp src --      SpO2 02/27/23 1816 98 %     Weight 02/27/23 1839 200 lb (90.7 kg)     Height 02/27/23 1839 5\' 6"  (1.676 m)     Head Circumference --      Peak Flow --      Pain Score 02/27/23 1839 0     Pain Loc --      Pain Edu? --      Excl. in GC? --     Most recent vital signs: Vitals:   02/27/23 1816  BP: (!) 154/73  Pulse: 70  Resp: 20  Temp: 98.3 F (36.8 C)  SpO2: 98%    General: Awake, no distress.  CV:  Good peripheral perfusion.   Resp:  Normal effort.  Abd:  No distention.  MSK:  No deformity noted.  Neuro:   Asymmetry with facial sensation to light touch with feeling unusual on the right side, but motor function appears intact and symmetric with smiling and facial expressions. 5/5 strength and sensation all 4 extremities without evidence of extremity deficit No clear signs of other cranial nerve deficits beyond facial sensation asymmetry Other:     ED Results / Procedures / Treatments   Labs (all labs ordered are listed, but only abnormal results are displayed) Labs Reviewed  CBC - Abnormal; Notable for the following components:      Result Value   RBC 3.40 (*)    Hemoglobin 10.4 (*)    HCT 32.1 (*)    All other components within normal limits  BASIC METABOLIC PANEL - Abnormal; Notable for the following components:   Sodium 133 (*)    Potassium 3.2 (*)    CO2 21 (*)    Glucose, Bld 114 (*)    All other components within normal limits  TROPONIN I (  HIGH SENSITIVITY) - Abnormal; Notable for the following components:   Troponin I (High Sensitivity) 127 (*)    All other components within normal limits  PROTIME-INR  APTT  BRAIN NATRIURETIC PEPTIDE  HEPARIN LEVEL (UNFRACTIONATED)  TROPONIN I (HIGH SENSITIVITY)    EKG Sinus rhythm at a rate of 74 bpm.  Normal axis and intervals.  No clear signs of acute ischemia.  RADIOLOGY CXR interpreted by me without evidence of acute cardiopulmonary pathology.  Official radiology report(s): CT ANGIO HEAD NECK W WO CM  Result Date: 02/28/2023 CLINICAL DATA:  Initial evaluation for intermittent dizziness, right facial numbness. EXAM: CT ANGIOGRAPHY HEAD AND NECK WITH AND WITHOUT CONTRAST TECHNIQUE: Multidetector CT imaging of the head and neck was performed using the standard protocol during bolus administration of intravenous contrast. Multiplanar CT image reconstructions and MIPs were obtained to evaluate the vascular anatomy. Carotid stenosis measurements (when  applicable) are obtained utilizing NASCET criteria, using the distal internal carotid diameter as the denominator. RADIATION DOSE REDUCTION: This exam was performed according to the departmental dose-optimization program which includes automated exposure control, adjustment of the mA and/or kV according to patient size and/or use of iterative reconstruction technique. CONTRAST:  OMNIPAQUE IOHEXOL 350 MG/ML SOLN COMPARISON:  Prior study from 02/10/2023. FINDINGS: CT HEAD FINDINGS Brain: Cerebral volume within normal limits for patient age. No evidence for acute intracranial hemorrhage. No findings to suggest acute large vessel territory infarct. No mass lesion, midline shift, or mass effect. Ventricles are normal in size without evidence for hydrocephalus. No extra-axial fluid collection identified. Vascular: No hyperdense vessel identified. Skull: Scalp soft tissues demonstrate no acute abnormality. Calvarium intact. Sinuses/Orbits: Globes and orbital soft tissues within normal limits. Visualized paranasal sinuses are clear. No mastoid effusion. CTA NECK FINDINGS Aortic arch: Visualized aortic arch normal caliber with standard branch pattern. No stenosis about the origin the great vessels. Mild atheromatous change about the arch itself. Right carotid system: Right common and internal carotid arteries are patent without dissection. Calcified plaque about the right carotid bulb/proximal right ICA with associated stenosis of up to 50% by NASCET criteria, stable. Left carotid system: Left common and internal carotid arteries patent without evidence for dissection. Interval stenting across the left carotid bulb. Patent flow through the stent without visible intra stent thrombus. Narrowing at the mid aspect of the stent with residual 50% stenosis (series 10, image 205). Vertebral arteries: Both vertebral arteries arise from subclavian arteries. No proximal subclavian artery stenosis. Vertebral arteries are patent  without stenosis or dissection. Skeleton: No discrete or worrisome osseous lesions. Mild-to-moderate multilevel cervical spondylosis without significant spinal stenosis. Other neck: No other acute soft tissue abnormality within the neck. Upper chest: Visualized upper chest demonstrates no acute finding. Review of the MIP images confirms the above findings CTA HEAD FINDINGS Anterior circulation: Both internal carotid arteries remain patent through the siphons to the termini without stenosis. A1 segments patent bilaterally. Normal anterior cute artery complex. Anterior cerebral arteries patent without stenosis. No M1 stenosis or occlusion. No proximal MCA branch occlusion or high-grade stenosis. Distal MCA branches remain patent and perfused bilaterally. Posterior circulation: Both V4 segments patent without stenosis. Right PICA patent. Left PICA origin not seen. Basilar markedly diminutive but patent to its distal aspect without focal stenosis. Superior cerebral arteries patent bilaterally. Fetal type origin of the PCAs, both of which remain patent to their distal aspects without significant stenosis. Venous sinuses: Patent allowing for timing the contrast bolus. Anatomic variants: Fetal type origin of the PCAs with overall  diminutive vertebrobasilar system. No aneurysm. Review of the MIP images confirms the above findings IMPRESSION: 1. Negative CTA for large vessel occlusion or other emergent finding. 2. Interval stenting across the left carotid bulb. Patent flow through the stent without visible intra stent thrombus or other complication. Narrowing at the mid aspect of the stent with residual 50% stenosis, previously 70% on 02/10/2023. 3. Atheromatous plaque about the right carotid bulb/proximal right ICA with associated stenosis of up to 50% by NASCET criteria, stable. 4. Fetal type origin of the PCAs with overall diminutive vertebrobasilar system. 5. No other acute intracranial abnormality . Electronically  Signed   By: Rise Mu M.D.   On: 02/28/2023 02:52   CT Angio Chest PE W and/or Wo Contrast  Result Date: 02/28/2023 CLINICAL DATA:  Shortness of breath EXAM: CT ANGIOGRAPHY CHEST WITH CONTRAST TECHNIQUE: Multidetector CT imaging of the chest was performed using the standard protocol during bolus administration of intravenous contrast. Multiplanar CT image reconstructions and MIPs were obtained to evaluate the vascular anatomy. RADIATION DOSE REDUCTION: This exam was performed according to the departmental dose-optimization program which includes automated exposure control, adjustment of the mA and/or kV according to patient size and/or use of iterative reconstruction technique. CONTRAST:  OMNIPAQUE IOHEXOL 350 MG/ML SOLN COMPARISON:  Chest x-ray earlier tonight. FINDINGS: Cardiovascular: Heart is normal size. Aorta is normal caliber. No filling defects in the pulmonary arteries to suggest pulmonary emboli. Moderate coronary artery calcifications, most pronounced in LAD. Mediastinum/Nodes: No mediastinal, hilar, or axillary adenopathy. Trachea and esophagus are unremarkable. Thyroid unremarkable. Lungs/Pleura: Lungs are clear. No focal airspace opacities or suspicious nodules. No effusions. Upper Abdomen: No acute findings Musculoskeletal: Chest wall soft tissues are unremarkable. No acute bony abnormality. Review of the MIP images confirms the above findings. IMPRESSION: No evidence of pulmonary embolus. No acute cardiopulmonary disease. Coronary artery disease. Electronically Signed   By: Charlett Nose M.D.   On: 02/28/2023 02:03   DG Chest 1 View  Result Date: 02/27/2023 CLINICAL DATA:  Shortness of breath EXAM: CHEST  1 VIEW COMPARISON:  Chest radiograph dated 06/03/2022 FINDINGS: Normal lung volumes. No focal consolidations. No pleural effusion or pneumothorax. The heart size and mediastinal contours are within normal limits. The visualized skeletal structures are unremarkable.  IMPRESSION: No active disease. Electronically Signed   By: Agustin Cree M.D.   On: 02/27/2023 19:10    PROCEDURES and INTERVENTIONS:  .1-3 Lead EKG Interpretation  Performed by: Delton Prairie, MD Authorized by: Delton Prairie, MD     Interpretation: normal     ECG rate:  70   ECG rate assessment: normal     Rhythm: sinus rhythm     Ectopy: none     Conduction: normal   .Critical Care  Performed by: Delton Prairie, MD Authorized by: Delton Prairie, MD   Critical care provider statement:    Critical care time (minutes):  30   Critical care time was exclusive of:  Separately billable procedures and treating other patients   Critical care was necessary to treat or prevent imminent or life-threatening deterioration of the following conditions:  Cardiac failure   Critical care was time spent personally by me on the following activities:  Development of treatment plan with patient or surrogate, discussions with consultants, evaluation of patient's response to treatment, examination of patient, ordering and review of laboratory studies, ordering and review of radiographic studies, ordering and performing treatments and interventions, pulse oximetry, re-evaluation of patient's condition and review of old charts  Medications  heparin ADULT infusion 100 units/mL (25000 units/29250mL) (950 Units/hr Intravenous New Bag/Given 02/28/23 0046)  potassium chloride (KLOR-CON) packet 40 mEq (40 mEq Oral Given 02/28/23 0030)  aspirin chewable tablet 324 mg (324 mg Oral Given 02/28/23 0030)  iohexol (OMNIPAQUE) 350 MG/ML injection 150 mL (150 mLs Intravenous Contrast Given 02/28/23 0140)  heparin bolus via infusion 4,000 Units (4,000 Units Intravenous Bolus from Bag 02/28/23 0043)     IMPRESSION / MDM / ASSESSMENT AND PLAN / ED COURSE  I reviewed the triage vital signs and the nursing notes.  Differential diagnosis includes, but is not limited to, TIA, stroke, ACS, anxiety  {Patient presents with symptoms of an  acute illness or injury that is potentially life-threatening.  72 year old woman with recent TIA presents with evidence of an NSTEMI requiring heparinization and medical admission.  Her symptoms are not clearly cardiopulmonary but she does report a degree of dyspnea associated with her episodic symptoms.  EKG is nonischemic and first troponin is negative, but second 1 jumps up to about 130.  Will provide aspirin and heparin.  Metabolic panel with renal function intact.  Normal white count.  CTA head/neck without signs of complications from her recent ICA stent placement.  CTA chest without evidence of PE or other cardiopulmonary derangements.  Will consult medicine for admission.  Clinical Course as of 02/28/23 0325  Fri Feb 28, 2023  0010 I call CT to discuss CT angiogram options after second troponin returns elevated.  I wanted to do a CTA head/neck to assess for ICA pathology considering her presenting complaints are intermittent neurologic symptoms and her recent intervention, but with the elevated troponin I also ordered a CTA chest PE study but unfortunately I am unable to do both of these studies with the same contrast bolus and I am told that I need to choose 1 or the other.  Considering her recent interventions and presenting symptoms we will do a head and neck first.  She will need to be heparinized anyways considering her NSTEMI and she is not unstable so I doubt a saddle PE and so a CTA PE study should not immediately change her management, most likely. [DS]  0016 After I canceled this order, CT tech calls me back and says they will try to get both with a larger contrast bolus or something.  I emphasized the importance of the head/neck and CT technician tells me that he will try. [DS]  [id number] Reassessed and discussed NSTEMI.  Questions and discussed plan of care [DS]    Clinical Course User Index [DS] Delton PrairieSmith, Sinai Illingworth, MD     FINAL CLINICAL IMPRESSION(S) / ED DIAGNOSES   Final diagnoses:   NSTEMI (non-ST elevated myocardial infarction)     Rx / DC Orders   ED Discharge Orders     None        Note:  This document was prepared using Dragon voice recognition software and may include unintentional dictation errors.   Delton PrairieSmith, Famous Eisenhardt, MD 02/28/23 [phone]

## 2023-02-27 NOTE — ED Triage Notes (Signed)
Pt to ED for shob today. States she didn't feel right earlier and felt like she was going to pass out. Pt in NAD. Alert and oriented.

## 2023-02-27 NOTE — ED Triage Notes (Signed)
Arrives from [facility] via [employer].  Per report.  Called for difficulty breathing.  RA sat 98%.  Placed on 2l/ [place] - patient c/o SOB.  PMH of CVA last week.  20g RAC.  164/88 77 100% on 2l/ [place] ETCO2 23 Rr:  28 97.5

## 2023-02-28 ENCOUNTER — Emergency Department: Payer: Medicare Other

## 2023-02-28 ENCOUNTER — Other Ambulatory Visit: Payer: Self-pay

## 2023-02-28 ENCOUNTER — Inpatient Hospital Stay (HOSPITAL_COMMUNITY)
Admit: 2023-02-28 | Discharge: 2023-02-28 | Disposition: A | Discharge status: Home / Self Care | Payer: Medicare Other | Attending: Cardiovascular Disease | Admitting: Cardiovascular Disease

## 2023-02-28 ENCOUNTER — Encounter: Payer: Self-pay | Admitting: Internal Medicine

## 2023-02-28 DIAGNOSIS — I509 Heart failure, unspecified: Secondary | ICD-10-CM

## 2023-02-28 DIAGNOSIS — I2489 Other forms of acute ischemic heart disease: Secondary | ICD-10-CM | POA: Diagnosis present

## 2023-02-28 DIAGNOSIS — R0602 Shortness of breath: Secondary | ICD-10-CM | POA: Diagnosis present

## 2023-02-28 DIAGNOSIS — J45909 Unspecified asthma, uncomplicated: Secondary | ICD-10-CM | POA: Diagnosis present

## 2023-02-28 DIAGNOSIS — Z884 Allergy status to anesthetic agent status: Secondary | ICD-10-CM | POA: Diagnosis not present

## 2023-02-28 DIAGNOSIS — E1159 Type 2 diabetes mellitus with other circulatory complications: Secondary | ICD-10-CM

## 2023-02-28 DIAGNOSIS — R7989 Other specified abnormal findings of blood chemistry: Secondary | ICD-10-CM | POA: Diagnosis not present

## 2023-02-28 DIAGNOSIS — K5900 Constipation, unspecified: Secondary | ICD-10-CM | POA: Diagnosis present

## 2023-02-28 DIAGNOSIS — Z91040 Latex allergy status: Secondary | ICD-10-CM | POA: Diagnosis not present

## 2023-02-28 DIAGNOSIS — Z7982 Long term (current) use of aspirin: Secondary | ICD-10-CM | POA: Diagnosis not present

## 2023-02-28 DIAGNOSIS — Z95820 Peripheral vascular angioplasty status with implants and grafts: Secondary | ICD-10-CM | POA: Diagnosis not present

## 2023-02-28 DIAGNOSIS — I214 Non-ST elevation (NSTEMI) myocardial infarction: Secondary | ICD-10-CM | POA: Diagnosis present

## 2023-02-28 DIAGNOSIS — M1990 Unspecified osteoarthritis, unspecified site: Secondary | ICD-10-CM | POA: Diagnosis present

## 2023-02-28 DIAGNOSIS — R252 Cramp and spasm: Secondary | ICD-10-CM | POA: Diagnosis present

## 2023-02-28 DIAGNOSIS — I10 Essential (primary) hypertension: Secondary | ICD-10-CM | POA: Diagnosis present

## 2023-02-28 DIAGNOSIS — I6522 Occlusion and stenosis of left carotid artery: Secondary | ICD-10-CM | POA: Diagnosis present

## 2023-02-28 DIAGNOSIS — Z7902 Long term (current) use of antithrombotics/antiplatelets: Secondary | ICD-10-CM | POA: Diagnosis not present

## 2023-02-28 DIAGNOSIS — Z90710 Acquired absence of both cervix and uterus: Secondary | ICD-10-CM | POA: Diagnosis not present

## 2023-02-28 DIAGNOSIS — I6521 Occlusion and stenosis of right carotid artery: Secondary | ICD-10-CM | POA: Diagnosis not present

## 2023-02-28 DIAGNOSIS — E785 Hyperlipidemia, unspecified: Secondary | ICD-10-CM | POA: Diagnosis present

## 2023-02-28 DIAGNOSIS — I2510 Atherosclerotic heart disease of native coronary artery without angina pectoris: Secondary | ICD-10-CM | POA: Diagnosis present

## 2023-02-28 DIAGNOSIS — E119 Type 2 diabetes mellitus without complications: Secondary | ICD-10-CM | POA: Diagnosis present

## 2023-02-28 DIAGNOSIS — G4733 Obstructive sleep apnea (adult) (pediatric): Secondary | ICD-10-CM | POA: Diagnosis present

## 2023-02-28 DIAGNOSIS — R42 Dizziness and giddiness: Secondary | ICD-10-CM | POA: Diagnosis present

## 2023-02-28 DIAGNOSIS — R55 Syncope and collapse: Secondary | ICD-10-CM | POA: Diagnosis present

## 2023-02-28 DIAGNOSIS — Z8249 Family history of ischemic heart disease and other diseases of the circulatory system: Secondary | ICD-10-CM | POA: Diagnosis not present

## 2023-02-28 DIAGNOSIS — Z7984 Long term (current) use of oral hypoglycemic drugs: Secondary | ICD-10-CM | POA: Diagnosis not present

## 2023-02-28 DIAGNOSIS — Z8673 Personal history of transient ischemic attack (TIA), and cerebral infarction without residual deficits: Secondary | ICD-10-CM | POA: Diagnosis not present

## 2023-02-28 DIAGNOSIS — E876 Hypokalemia: Secondary | ICD-10-CM | POA: Diagnosis present

## 2023-02-28 DIAGNOSIS — K219 Gastro-esophageal reflux disease without esophagitis: Secondary | ICD-10-CM | POA: Diagnosis present

## 2023-02-28 LAB — ECHOCARDIOGRAM LIMITED
Area-P 1/2: 3.37 cm2
Height: 66 in
S' Lateral: 2 cm
Weight: 3200 oz

## 2023-02-28 LAB — GLUCOSE, CAPILLARY
Glucose-Capillary: 108 mg/dL — ABNORMAL HIGH (ref 70–99)
Glucose-Capillary: 156 mg/dL — ABNORMAL HIGH (ref 70–99)
Glucose-Capillary: 212 mg/dL — ABNORMAL HIGH (ref 70–99)

## 2023-02-28 LAB — TROPONIN I (HIGH SENSITIVITY)
Troponin I (High Sensitivity): 127 ng/L (ref <18)
Troponin I (High Sensitivity): 51 ng/L — ABNORMAL HIGH (ref <18)

## 2023-02-28 LAB — BRAIN NATRIURETIC PEPTIDE: B Natriuretic Peptide: 30.9 pg/mL (ref 0.0–100.0)

## 2023-02-28 LAB — HEPARIN LEVEL (UNFRACTIONATED)
Heparin Unfractionated: 0.51 IU/mL (ref 0.30–0.70)
Heparin Unfractionated: 0.52 IU/mL (ref 0.30–0.70)

## 2023-02-28 LAB — PROTIME-INR
INR: 1 (ref 0.8–1.2)
Prothrombin Time: 13.3 seconds (ref 11.4–15.2)

## 2023-02-28 LAB — CBG MONITORING, ED
Glucose-Capillary: 119 mg/dL — ABNORMAL HIGH (ref 70–99)
Glucose-Capillary: 121 mg/dL — ABNORMAL HIGH (ref 70–99)
Glucose-Capillary: 107 mg/dL — ABNORMAL HIGH (ref 70–99)

## 2023-02-28 LAB — APTT: aPTT: 26 seconds (ref 24–36)

## 2023-02-28 MED ORDER — IOHEXOL 350 MG/ML SOLN
150.0000 mL | Freq: Once | INTRAVENOUS | Status: AC | PRN
  Administered 2023-02-28: 150 mL via INTRAVENOUS

## 2023-02-28 MED ORDER — POTASSIUM CHLORIDE 20 MEQ PO PACK
40.0000 meq | PACK | Freq: Once | ORAL | Status: AC
  Administered 2023-02-28: 40 meq via ORAL
  Filled 2023-02-28: qty 2

## 2023-02-28 MED ORDER — CARVEDILOL 6.25 MG PO TABS
6.2500 mg | ORAL_TABLET | Freq: Two times a day (BID) | ORAL | Status: DC
  Administered 2023-02-28 – 2023-03-01 (×4): 6.25 mg via ORAL
  Filled 2023-02-28 (×4): qty 1

## 2023-02-28 MED ORDER — HEPARIN BOLUS VIA INFUSION
4000.0000 [IU] | Freq: Once | INTRAVENOUS | Status: AC
  Administered 2023-02-28: 4000 [IU] via INTRAVENOUS
  Filled 2023-02-28: qty 4000

## 2023-02-28 MED ORDER — ALBUTEROL SULFATE (2.5 MG/3ML) 0.083% IN NEBU: 2.5000 mg | INHALATION_SOLUTION | Freq: Four times a day (QID) | RESPIRATORY_TRACT | Status: DC | PRN

## 2023-02-28 MED ORDER — ACETAMINOPHEN 325 MG PO TABS
650.0000 mg | ORAL_TABLET | ORAL | Status: DC | PRN
  Administered 2023-03-01: 650 mg via ORAL
  Filled 2023-02-28: qty 2

## 2023-02-28 MED ORDER — ATORVASTATIN CALCIUM 20 MG PO TABS
40.0000 mg | ORAL_TABLET | Freq: Every day | ORAL | Status: DC
  Administered 2023-02-28 – 2023-03-01 (×2): 40 mg via ORAL
  Filled 2023-02-28 (×2): qty 2

## 2023-02-28 MED ORDER — HEPARIN (PORCINE) 25000 UT/250ML-% IV SOLN
950.0000 [IU]/h | INTRAVENOUS | Status: DC
  Administered 2023-02-28 (×2): 950 [IU]/h via INTRAVENOUS
  Filled 2023-02-28 (×2): qty 250

## 2023-02-28 MED ORDER — ASPIRIN 81 MG PO TBEC: 81.0000 mg | DELAYED_RELEASE_TABLET | Freq: Every day | ORAL | Status: DC

## 2023-02-28 MED ORDER — INSULIN ASPART 100 UNIT/ML IJ SOLN
0.0000 [IU] | INTRAMUSCULAR | Status: DC
  Administered 2023-02-28: 5 [IU] via SUBCUTANEOUS
  Administered 2023-02-28: 3 [IU] via SUBCUTANEOUS
  Administered 2023-02-28 – 2023-03-01 (×4): 2 [IU] via SUBCUTANEOUS
  Filled 2023-02-28 (×5): qty 1

## 2023-02-28 MED ORDER — ASPIRIN 81 MG PO CHEW
324.0000 mg | CHEWABLE_TABLET | Freq: Once | ORAL | Status: AC
  Administered 2023-02-28: 324 mg via ORAL
  Filled 2023-02-28: qty 4

## 2023-02-28 MED ORDER — NITROGLYCERIN 0.4 MG SL SUBL: 0.4000 mg | SUBLINGUAL_TABLET | SUBLINGUAL | Status: DC | PRN

## 2023-02-28 MED ORDER — FUROSEMIDE 20 MG PO TABS
20.0000 mg | ORAL_TABLET | ORAL | Status: DC
  Administered 2023-02-28: 20 mg via ORAL
  Filled 2023-02-28: qty 1

## 2023-02-28 MED ORDER — HEPARIN SODIUM (PORCINE) 5000 UNIT/ML IJ SOLN: 4000.0000 [IU] | Freq: Once | INTRAMUSCULAR | Status: DC

## 2023-02-28 MED ORDER — ASPIRIN 81 MG PO TBEC
81.0000 mg | DELAYED_RELEASE_TABLET | Freq: Every day | ORAL | Status: DC
  Administered 2023-02-28 – 2023-03-01 (×2): 81 mg via ORAL
  Filled 2023-02-28 (×2): qty 1

## 2023-02-28 MED ORDER — ONDANSETRON HCL 4 MG/2ML IJ SOLN: 4.0000 mg | Freq: Four times a day (QID) | INTRAMUSCULAR | Status: DC | PRN

## 2023-02-28 NOTE — Consult Note (Addendum)
Cardiology Consultation   Patient ID: Destiny Savage MRN: [id number]; DOB: [birth year]  Admit date: 02/27/2023 Date of Consult: 02/28/2023  PCP:  Jaclyn Shaggy, MD   [facility] Providers Cardiologist:  Julien Nordmann, MD   Physician requesting consult: Dr. Para March Reason for consult: Shortness of breath, elevated troponin  Patient Profile:   Destiny Savage is a 72 y.o. female with a hx of TIA/stroke, diabetes type 2, PAD with carotid stenosis, recent stenting, hypertension, hyperlipidemia, chronic nausea/vomiting/diarrhea, constipation, obstructive sleep apnea not on CPAP, frequent visits to the emergency room who presents with shortness of breath, fuzziness in her head  History of Present Illness:   Ms. Destiny Savage was recently discharged from the hospital February 13, 2023 for acute onset of expressive dysphagia and slurred speech with associated left-sided numbness, funny feeling in her head with associated mild left facial numbness, difficulty with ambulation, 70% proximal left carotid artery stenosis noted, status post stent February 12, 2023, on aspirin Plavix Reports since going home she has continued to have that " funny feeling" in her head, difficulty focusing.  Symptoms will come on for 5 minutes or so at a time, sometimes when sitting, other times when standing, will resolve without intervention.  Also with periodic shortness of breath.  Denies significant chest pain concerning for angina Has been relatively sedentary, sometimes walks with a walker, denies recent falls Yesterday driving in the car with her husband when she had funny feeling in her head, some worsening shortness of breath and presented to the emergency room for further evaluation  Saturations 98% on room air in the ER, blood pressure 160 over 80s, heart rate 70s normal sinus rhythm Overnight on telemetry no significant arrhythmia despite having some funny feelings in her head Nurses report getting up to use  bedside commode she was weak, little bit of shortness of breath CT scan of the head with patent carotid stent, no other acute findings Troponin 12, repeat 127 Started on heparin infusion  EKG no acute changes  Past Medical History:  Diagnosis Date   Arthritis    Asthma    Diabetes mellitus    GERD (gastroesophageal reflux disease)    Hyperlipidemia    Hypertension    Mini stroke    Stroke     Past Surgical History:  Procedure Laterality Date   ABDOMINAL HYSTERECTOMY     BREAST EXCISIONAL BIOPSY Left 1980's   benign   CARDIAC CATHETERIZATION     CAROTID PTA/STENT INTERVENTION Left 02/12/2023   Procedure: CAROTID PTA/STENT INTERVENTION;  Surgeon: Renford Dills, MD;  Location: [facility] INVASIVE CV LAB;  Service: Cardiovascular;  Laterality: Left;   CHOLECYSTECTOMY       Home Medications:  Prior to Admission medications   Medication Sig Start Date End Date Taking? Authorizing Provider  acetaminophen (TYLENOL) 500 MG tablet Take 500-1,000 mg by mouth every 8 (eight) hours as needed.   Yes [provider]  albuterol (VENTOLIN HFA) 108 (90 Base) MCG/ACT inhaler Inhale 1 puff into the lungs every 6 (six) hours as needed for wheezing.   Yes [provider]  aspirin 81 MG tablet Take 1 tablet (81 mg total) by mouth daily. 01/11/17  Yes Sudini, Wardell Heath, MD  atorvastatin (LIPITOR) 40 MG tablet Take 1 tablet (40 mg total) by mouth daily. 06/05/22  Yes Antonieta Iba, MD  carvedilol (COREG) 6.25 MG tablet Take 1 tablet (6.25 mg total) by mouth 2 (two) times daily with a meal. 03/21/20  Yes Bella Brummet,  Tollie Pizza, MD  cholecalciferol (VITAMIN D3) 25 MCG (1000 UNIT) tablet Take 1,000 Units by mouth daily.   Yes [provider]  clopidogrel (PLAVIX) 75 MG tablet Take 1 tablet (75 mg total) by mouth daily. 03/21/20  Yes Antonieta Iba, MD  furosemide (LASIX) 20 MG tablet Take 1 tablet (20 mg total) by mouth every Monday, Wednesday, and Friday. 06/05/22  Yes Noriah Osgood, Tollie Pizza, MD  metFORMIN (GLUCOPHAGE-XR) 500 MG 24 hr tablet Take 500 mg by mouth in the morning and 1000 mg at hight. 11/23/19  Yes [provider]  methocarbamol (ROBAXIN) 500 MG tablet Take 500 mg by mouth every 8 (eight) hours as needed for muscle spasms.   Yes [provider]  Multiple Vitamin (MULTIVITAMIN WITH MINERALS) TABS tablet Take 1 tablet by mouth daily.   Yes [provider]  oxyCODONE-acetaminophen (PERCOCET/ROXICET) 5-325 MG tablet Take 1 tablet by mouth every 8 (eight) hours as needed for severe pain.   Yes [provider]  polyethylene glycol (MIRALAX) 17 g packet Take 17 g by mouth daily. 09/09/22  Yes Marrion Coy, MD  potassium chloride (KLOR-CON) 10 MEQ tablet Take 1 tablet (10 mEq total) by mouth every Monday, Wednesday, and Friday. 06/05/22  Yes Mishell Donalson, Tollie Pizza, MD  gabapentin (NEURONTIN) 300 MG capsule Take 300 mg by mouth at bedtime. Patient not taking: Reported on 02/28/2023    [provider]  meloxicam (MOBIC) 7.5 MG tablet Take 7.5 mg by mouth daily. Patient not taking: Reported on 02/28/2023    [provider]  pantoprazole (PROTONIX) 40 MG tablet Take 40 mg by mouth daily. Patient not taking: Reported on 02/28/2023    [provider]    Inpatient Medications: Scheduled Meds:  aspirin EC  81 mg Oral Daily   atorvastatin  40 mg Oral Daily   carvedilol  6.25 mg Oral BID WC   furosemide  20 mg Oral Q M,W,F   insulin aspart  0-15 Units Subcutaneous Q4H   Continuous Infusions:  heparin 950 Units/hr (02/28/23 0709)   PRN Meds: acetaminophen, albuterol, nitroGLYCERIN, ondansetron (ZOFRAN) IV  Allergies:    Allergies  Allergen Reactions   Latex Hives and Rash    Other reaction(s): Other (See Comments)   Lidocaine Hives   Phenobarbital-Belladonna Alk     Other reaction(s): Hives & Rash   Phenobarbital-Belladonna Alk Hives and Rash    Social History:   Social History   Socioeconomic History   Marital  status: Married    Spouse name: Not on file   Number of children: 3   Years of education: Not on file   Highest education level: Some college, no degree  Occupational History   Not on file  Tobacco Use   Smoking status: Never   Smokeless tobacco: Never  Vaping Use   Vaping Use: Never used  Substance and Sexual Activity   Alcohol use: No   Drug use: No   Sexual activity: Yes    Partners: Male    Birth control/protection: Surgical  Other Topics Concern   Not on file  Social History Narrative   Married lives with spouse   3 children   Left handed   1 year of college   Drinks coffee, tea and soda sometimes   Social Determinants of Health   Financial Resource Strain: Not on file  Food Insecurity: No Food Insecurity (02/11/2023)   Hunger Vital Sign    Worried About Running Out of Food in the Last Year: Never  true    Ran Out of Food in the Last Year: Never true  Transportation Needs: No Transportation Needs (02/11/2023)   PRAPARE - Administrator, Civil Service (Medical): No    Lack of Transportation (Non-Medical): No  Physical Activity: Not on file  Stress: Not on file  Social Connections: Not on file  Intimate Partner Violence: Not At Risk (02/11/2023)   Humiliation, Afraid, Rape, and Kick questionnaire    Fear of Current or Ex-Partner: No    Emotionally Abused: No    Physically Abused: No    Sexually Abused: No    Family History:    Family History  Problem Relation Age of Onset   Heart Problems Father    Hypertension Other    CAD Other    Breast cancer Neg Hx      ROS:  Please see the history of present illness.  Review of Systems  Constitutional: Negative.   HENT: Negative.    Respiratory:  Positive for shortness of breath.   Cardiovascular: Negative.   Gastrointestinal: Negative.   Musculoskeletal: Negative.   Neurological:  Positive for dizziness.  Psychiatric/Behavioral: Negative.    All other systems reviewed and are  negative.   Physical Exam/Data:   Vitals:   02/28/23 0600 02/28/23 0630 02/28/23 0730 02/28/23 0800  BP: (!) 152/88 (!) 146/75 135/85 (!) 150/84  Pulse: 64 69 67 71  Resp: 16 15 16  (!) 22  Temp:    98.1 F (36.7 C)  TempSrc:      SpO2: 99% 99% 97% 100%  Weight:      Height:        Intake/Output Summary (Last 24 hours) at 02/28/2023 1021 Last data filed at 02/28/2023 0709 Gross per 24 hour  Intake 98.72 ml  Output --  Net 98.72 ml      02/27/2023    6:39 PM 02/10/2023    7:39 PM 02/04/2023    8:56 PM  Last 3 Weights  Weight (lbs) 200 lb 197 lb 12 oz 200 lb 9.9 oz  Weight (kg) 90.719 kg 89.7 kg 91 kg     Body mass index is 32.28 kg/m.  General:  Well nourished, well developed, in no acute distress HEENT: normal Neck: no JVD Vascular: No carotid bruits; Distal pulses 2+ bilaterally Cardiac:  normal S1, S2; RRR; no murmur  Lungs:  clear to auscultation bilaterally, no wheezing, rhonchi or rales  Abd: soft, nontender, no hepatomegaly  Ext: no edema Musculoskeletal:  No deformities, BUE and BLE strength normal and equal Skin: warm and dry  Neuro:  CNs 2-12 intact, no focal abnormalities noted Psych:  Normal affect   EKG:  The EKG was personally reviewed and demonstrates:   Normal sinus rhythm rate 74 bpm no significant ST-T wave changes  Telemetry:  Telemetry was personally reviewed and demonstrates:   Normal sinus rhythm, no significant arrhythmia  Relevant CV Studies:  echo March 2024  1. Left ventricular ejection fraction, by estimation, is 65 to 70%. The  left ventricle has normal function. The left ventricle has no regional  wall motion abnormalities. Left ventricular diastolic parameters are  consistent with Grade I diastolic  dysfunction (impaired relaxation).   2. Right ventricular systolic function is normal. The right ventricular  size is normal. There is normal pulmonary artery systolic pressure.   3. The mitral valve is normal in structure. Mild  mitral valve  regurgitation. No evidence of mitral stenosis.   4. The aortic valve is normal in  structure. Aortic valve regurgitation is  not visualized. No aortic stenosis is present.   5. The inferior vena cava is normal in size with greater than 50%  respiratory variability, suggesting right atrial pressure of 3 mmHg.   6. Agitated saline contrast bubble study was negative, with no evidence  of any interatrial shunt.   Laboratory Data:  High Sensitivity Troponin:   Recent Labs  Lab 02/27/23 1824 02/27/23 2321  TROPONINIHS 12 127*     Chemistry Recent Labs  Lab 02/27/23 1824  NA 133*  K 3.2*  CL 100  CO2 21*  GLUCOSE 114*  BUN 12  CREATININE 0.75  CALCIUM 9.4  GFRNONAA >60  ANIONGAP 12    No results for input(s): "PROT", "ALBUMIN", "AST", "ALT", "ALKPHOS", "BILITOT" in the last 168 hours. Lipids No results for input(s): "CHOL", "TRIG", "HDL", "LABVLDL", "LDLCALC", "CHOLHDL" in the last 168 hours.  Hematology Recent Labs  Lab 02/27/23 1824  WBC 8.8  RBC 3.40*  HGB 10.4*  HCT 32.1*  MCV 94.4  MCH 30.6  MCHC 32.4  RDW 13.8  PLT 342   Thyroid No results for input(s): "TSH", "FREET4" in the last 168 hours.  BNP Recent Labs  Lab 02/28/23 0031  BNP 30.9    DDimer No results for input(s): "DDIMER" in the last 168 hours.   Radiology/Studies:  CT ANGIO HEAD NECK W WO CM  Result Date: 02/28/2023 CLINICAL DATA:  Initial evaluation for intermittent dizziness, right facial numbness. EXAM: CT ANGIOGRAPHY HEAD AND NECK WITH AND WITHOUT CONTRAST TECHNIQUE: Multidetector CT imaging of the head and neck was performed using the standard protocol during bolus administration of intravenous contrast. Multiplanar CT image reconstructions and MIPs were obtained to evaluate the vascular anatomy. Carotid stenosis measurements (when applicable) are obtained utilizing NASCET criteria, using the distal internal carotid diameter as the denominator. RADIATION DOSE REDUCTION: This exam  was performed according to the departmental dose-optimization program which includes automated exposure control, adjustment of the mA and/or kV according to patient size and/or use of iterative reconstruction technique. CONTRAST:  OMNIPAQUE IOHEXOL 350 MG/ML SOLN COMPARISON:  Prior study from 02/10/2023. FINDINGS: CT HEAD FINDINGS Brain: Cerebral volume within normal limits for patient age. No evidence for acute intracranial hemorrhage. No findings to suggest acute large vessel territory infarct. No mass lesion, midline shift, or mass effect. Ventricles are normal in size without evidence for hydrocephalus. No extra-axial fluid collection identified. Vascular: No hyperdense vessel identified. Skull: Scalp soft tissues demonstrate no acute abnormality. Calvarium intact. Sinuses/Orbits: Globes and orbital soft tissues within normal limits. Visualized paranasal sinuses are clear. No mastoid effusion. CTA NECK FINDINGS Aortic arch: Visualized aortic arch normal caliber with standard branch pattern. No stenosis about the origin the great vessels. Mild atheromatous change about the arch itself. Right carotid system: Right common and internal carotid arteries are patent without dissection. Calcified plaque about the right carotid bulb/proximal right ICA with associated stenosis of up to 50% by NASCET criteria, stable. Left carotid system: Left common and internal carotid arteries patent without evidence for dissection. Interval stenting across the left carotid bulb. Patent flow through the stent without visible intra stent thrombus. Narrowing at the mid aspect of the stent with residual 50% stenosis (series 10, image 205). Vertebral arteries: Both vertebral arteries arise from subclavian arteries. No proximal subclavian artery stenosis. Vertebral arteries are patent without stenosis or dissection. Skeleton: No discrete or worrisome osseous lesions. Mild-to-moderate multilevel cervical spondylosis without significant  spinal stenosis. Other neck: No other acute soft  tissue abnormality within the neck. Upper chest: Visualized upper chest demonstrates no acute finding. Review of the MIP images confirms the above findings CTA HEAD FINDINGS Anterior circulation: Both internal carotid arteries remain patent through the siphons to the termini without stenosis. A1 segments patent bilaterally. Normal anterior cute artery complex. Anterior cerebral arteries patent without stenosis. No M1 stenosis or occlusion. No proximal MCA branch occlusion or high-grade stenosis. Distal MCA branches remain patent and perfused bilaterally. Posterior circulation: Both V4 segments patent without stenosis. Right PICA patent. Left PICA origin not seen. Basilar markedly diminutive but patent to its distal aspect without focal stenosis. Superior cerebral arteries patent bilaterally. Fetal type origin of the PCAs, both of which remain patent to their distal aspects without significant stenosis. Venous sinuses: Patent allowing for timing the contrast bolus. Anatomic variants: Fetal type origin of the PCAs with overall diminutive vertebrobasilar system. No aneurysm. Review of the MIP images confirms the above findings IMPRESSION: 1. Negative CTA for large vessel occlusion or other emergent finding. 2. Interval stenting across the left carotid bulb. Patent flow through the stent without visible intra stent thrombus or other complication. Narrowing at the mid aspect of the stent with residual 50% stenosis, previously 70% on 02/10/2023. 3. Atheromatous plaque about the right carotid bulb/proximal right ICA with associated stenosis of up to 50% by NASCET criteria, stable. 4. Fetal type origin of the PCAs with overall diminutive vertebrobasilar system. 5. No other acute intracranial abnormality . Electronically Signed   By: Rise Mu M.D.   On: 02/28/2023 02:52   CT Angio Chest PE W and/or Wo Contrast  Result Date: 02/28/2023 CLINICAL DATA:  Shortness  of breath EXAM: CT ANGIOGRAPHY CHEST WITH CONTRAST TECHNIQUE: Multidetector CT imaging of the chest was performed using the standard protocol during bolus administration of intravenous contrast. Multiplanar CT image reconstructions and MIPs were obtained to evaluate the vascular anatomy. RADIATION DOSE REDUCTION: This exam was performed according to the departmental dose-optimization program which includes automated exposure control, adjustment of the mA and/or kV according to patient size and/or use of iterative reconstruction technique. CONTRAST:  OMNIPAQUE IOHEXOL 350 MG/ML SOLN COMPARISON:  Chest x-ray earlier tonight. FINDINGS: Cardiovascular: Heart is normal size. Aorta is normal caliber. No filling defects in the pulmonary arteries to suggest pulmonary emboli. Moderate coronary artery calcifications, most pronounced in LAD. Mediastinum/Nodes: No mediastinal, hilar, or axillary adenopathy. Trachea and esophagus are unremarkable. Thyroid unremarkable. Lungs/Pleura: Lungs are clear. No focal airspace opacities or suspicious nodules. No effusions. Upper Abdomen: No acute findings Musculoskeletal: Chest wall soft tissues are unremarkable. No acute bony abnormality. Review of the MIP images confirms the above findings. IMPRESSION: No evidence of pulmonary embolus. No acute cardiopulmonary disease. Coronary artery disease. Electronically Signed   By: Charlett Nose M.D.   On: 02/28/2023 02:03   DG Chest 1 View  Result Date: 02/27/2023 CLINICAL DATA:  Shortness of breath EXAM: CHEST  1 VIEW COMPARISON:  Chest radiograph dated 06/03/2022 FINDINGS: Normal lung volumes. No focal consolidations. No pleural effusion or pneumothorax. The heart size and mediastinal contours are within normal limits. The visualized skeletal structures are unremarkable. IMPRESSION: No active disease. Electronically Signed   By: Agustin Cree M.D.   On: 02/27/2023 19:10     Assessment and Plan:   Near syncope, "funny feeling" in her  head/near syncope Etiology unclear, symptoms were started on hospitalization last month in the setting of TIA requiring stent to her carotid CT scan head with no acute findings Unable to exclude  residual symptoms from TIA/stroke -No arrhythmia on telemetry and reports having some symptoms since she has been in the emergency room -Would continue on telemetry to rule out paroxysmal arrhythmia as a contributor to her symptoms Blood pressure stable  2.  Shortness of breath Recent echo several weeks ago with normal LV function Unable to exclude diastolic CHF, BNP low CT chest no evidence of PE, no acute cardiopulmonary disease Limited echocardiogram ordered  3.  TIA/carotid stenosis On aspirin Plavix, recent stent placed to left carotid, patent on CT scan Lipitor 40  4.  Elevated troponin 12 up to 100, repeat pending Reported having shortness of breath, unclear if this is anginal equivalent On heparin infusion For significant climb in troponin may need additional inpatient ischemic workup If nontrending, could consider outpatient ischemic workup  5.  Diabetes type 2, controlled Management per medicine service   Total encounter time more than 80 minutes  Greater than 50% was spent in counseling and coordination of care with the patient   For questions or updates, please contact [facility] Please consult [email or web address] for contact info under    Signed, Julien Nordmann, MD  02/28/2023 10:21 AM

## 2023-02-28 NOTE — ED Notes (Addendum)
Pt needed to urinate so got pt up to walk to the toilet. Pt was to unsteady to walk so writer got bedside commode for pt to use. Pt was able to void and got pt back in bed. Pt was very short of breath after exerting herself getting out and into the bed. Pt said she felt like she had "run a marathon". RN Elana was notified about this incident. Pt is back in bed with no other needs at this time and call bed within reach.

## 2023-02-28 NOTE — Progress Notes (Signed)
ANTICOAGULATION CONSULT NOTE - Initial Consult  Pharmacy Consult for Heparin  Indication: chest pain/ACS  Allergies  Allergen Reactions   Latex Hives and Rash    Other reaction(s): Other (See Comments)   Lidocaine Hives   Phenobarbital-Belladonna Alk     Other reaction(s): Hives & Rash   Phenobarbital-Belladonna Alk Hives and Rash    Patient Measurements: Height: 5\' 6"  (167.6 cm) Weight: 90.7 kg (200 lb) IBW/kg (Calculated) : 59.3 Heparin Dosing Weight: 79.1 kg   Vital Signs: Temp: 98.1 F (36.7 C) (04/12 0800) Temp Source: Oral (04/12 0341) BP: 126/85 (04/12 1125) Pulse Rate: 73 (04/12 1125)  Labs: Recent Labs    02/27/23 1824 02/27/23 2321 02/28/23 0031 02/28/23 0953 02/28/23 1048  HGB 10.4*  --   --   --   --   HCT 32.1*  --   --   --   --   PLT 342  --   --   --   --   APTT  --   --  26  --   --   LABPROT  --   --  13.3  --   --   INR  --   --  1.0  --   --   HEPARINUNFRC  --   --   --   --  0.51  CREATININE 0.75  --   --   --   --   TROPONINIHS 12 127*  --  51*  --      Estimated Creatinine Clearance: 73.2 mL/min (by C-G formula based on SCr of 0.75 mg/dL).   Medical History: Past Medical History:  Diagnosis Date   Arthritis    Asthma    Diabetes mellitus    GERD (gastroesophageal reflux disease)    Hyperlipidemia    Hypertension    Mini stroke    Stroke     Medications:  No evidence of PTA anticoagulation  Assessment: Pharmacy consulted to dose heparin in this 72 year old female admitted with ACS/NSTEMI.  No prior anticoag noted.  CrCl = 73.2 ml/min   Baseline Labs: aPTT - 26; INR - 1.0 Hgb - 10.4; Plts - 342  Goal of Therapy:  Heparin level 0.3-0.7 units/ml Monitor platelets by anticoagulation protocol: Yes  Date Time aPTT/HL Rate/Comment 4/12 1048 --/0.51  Therapeutic x 1, 950 units/hr     Plan:  Continue heparin infusion at 950 units/hr Check anti-Xa level in 8 hours and daily once consecutively therapeutic. Continue to  monitor H&H and platelets daily while on heparin gtt.     Manfred Shirts 02/28/2023,11:31 AM

## 2023-02-28 NOTE — ED Notes (Signed)
Gave pt sandwich tray and gingerale.

## 2023-02-28 NOTE — ED Notes (Signed)
Hospitalist at bedside 

## 2023-02-28 NOTE — Progress Notes (Signed)
*  PRELIMINARY RESULTS* Echocardiogram A Limited 2D Echocardiogram has been performed.  Carolyne Fiscal 02/28/2023, 4:08 PM

## 2023-02-28 NOTE — Progress Notes (Signed)
ANTICOAGULATION CONSULT NOTE - Initial Consult  Pharmacy Consult for Heparin  Indication: chest pain/ACS  Allergies  Allergen Reactions   Latex Hives and Rash    Other reaction(s): Other (See Comments)   Lidocaine Hives   Phenobarbital-Belladonna Alk     Other reaction(s): Hives & Rash   Phenobarbital-Belladonna Alk Hives and Rash    Patient Measurements: Height: 5\' 6"  (167.6 cm) Weight: 90.7 kg (200 lb) IBW/kg (Calculated) : 59.3 Heparin Dosing Weight: 79.1 kg   Vital Signs: Temp: 98.3 F (36.8 C) (04/11 1816) BP: 154/73 (04/11 1816) Pulse Rate: 70 (04/11 1816)  Labs: Recent Labs    02/27/23 1824 02/27/23 2321  HGB 10.4*  --   HCT 32.1*  --   PLT 342  --   CREATININE 0.75  --   TROPONINIHS 12 127*    Estimated Creatinine Clearance: 73.2 mL/min (by C-G formula based on SCr of 0.75 mg/dL).   Medical History: Past Medical History:  Diagnosis Date   Arthritis    Asthma    Diabetes mellitus    GERD (gastroesophageal reflux disease)    Hyperlipidemia    Hypertension    Mini stroke    Stroke [facility])     Medications:  (Not in a hospital admission)   Assessment: Pharmacy consulted to dose heparin in this 72 year old female admitted with ACS/NSTEMI.  No prior anticoag noted.  CrCl = 73.2 ml/min   Goal of Therapy:  Heparin level 0.3-0.7 units/ml Monitor platelets by anticoagulation protocol: Yes   Plan:  Give 4000 units bolus x 1 Start heparin infusion at 950 units/hr Check anti-Xa level in 8 hours and daily while on heparin Continue to monitor H&H and platelets  Zellie Jenning D 02/28/2023,12:20 AM

## 2023-02-28 NOTE — H&P (Signed)
History and Physical    Patient: Destiny Savage ZOX:[id number] DOB: 11-May-1951 DOA: 02/27/2023 DOS: the patient was seen and examined on 02/28/2023 PCP: Jaclyn Shaggy, MD  Patient coming from: Home  Chief Complaint:  Chief Complaint  Patient presents with   Shortness of Breath    HPI: MALAN WERK is a 72 y.o. female with medical history significant for Type 2 diabetes, HTN, prior CVA, recently hospitalized from 3/25 to 3/28 with a TIA with workup showing 70% left ICA stenosis, undergoing left carotid stent placement on 3/27, who present to the ED with shortness of breath and lightheadedness feeling like she was about to pass out.  She denies any of the symptoms associated with recent TIA of left-sided numbness, expressive aphasia and dysarthria.  She mostly feels dizzy.  She denies chest pains or shortness of breath.  She feels cramping in her left leg but she says this has been an ongoing problem from even before her recent TIA. ED course and data review: BP 154/73 but otherwise normal vitals.  Labs notable for troponin of 12-1 27.  Potassium 3.2.  EKG, personally viewed and interpreted showing NSR at 74 with no acute ST-T wave changes.  CTA head and neck was negative for large vessel occlusion or other emergent finding.  Interval stenting shows improvement from 70% on 3/25 now to 50% stenosis in the left ICA Chest x-ray nonacute Patient started on heparin infusion for NSTEMI and given chewable aspirin.  She was also given oral potassium.   Hospitalist consulted for admission   Review of Systems: As mentioned in the history of present illness. All other systems reviewed and are negative.  Past Medical History:  Diagnosis Date   Arthritis    Asthma    Diabetes mellitus    GERD (gastroesophageal reflux disease)    Hyperlipidemia    Hypertension    Mini stroke    Stroke [facility])    Past Surgical History:  Procedure Laterality Date   ABDOMINAL HYSTERECTOMY     BREAST EXCISIONAL  BIOPSY Left 1980's   benign   CARDIAC CATHETERIZATION     CAROTID PTA/STENT INTERVENTION Left 02/12/2023   Procedure: CAROTID PTA/STENT INTERVENTION;  Surgeon: Renford Dills, MD;  Location: [facility] INVASIVE CV LAB;  Service: Cardiovascular;  Laterality: Left;   CHOLECYSTECTOMY     Social History:  reports that she has never smoked. She has never used smokeless tobacco. She reports that she does not drink alcohol and does not use drugs.  Allergies  Allergen Reactions   Latex Hives and Rash    Other reaction(s): Other (See Comments)   Lidocaine Hives   Phenobarbital-Belladonna Alk     Other reaction(s): Hives & Rash   Phenobarbital-Belladonna Alk Hives and Rash    Family History  Problem Relation Age of Onset   Heart Problems Father    Hypertension Other    CAD Other    Breast cancer Neg Hx     Prior to Admission medications   Medication Sig Start Date End Date Taking? Authorizing Provider  acetaminophen (TYLENOL) 500 MG tablet Take 500-1,000 mg by mouth every 8 (eight) hours as needed.    [provider]  albuterol (VENTOLIN HFA) 108 (90 Base) MCG/ACT inhaler Inhale 1 puff into the lungs every 6 (six) hours as needed for wheezing.    [provider]  aspirin 81 MG tablet Take 1 tablet (81 mg total) by mouth daily. 01/11/17   Milagros Loll, MD  atorvastatin (LIPITOR) 40  MG tablet Take 1 tablet (40 mg total) by mouth daily. 06/05/22   Antonieta Iba, MD  carvedilol (COREG) 6.25 MG tablet Take 1 tablet (6.25 mg total) by mouth 2 (two) times daily with a meal. 03/21/20   Gollan, Tollie Pizza, MD  cholecalciferol (VITAMIN D3) 25 MCG (1000 UNIT) tablet Take 1,000 Units by mouth daily.    [provider]  clopidogrel (PLAVIX) 75 MG tablet Take 1 tablet (75 mg total) by mouth daily. 03/21/20   Antonieta Iba, MD  furosemide (LASIX) 20 MG tablet Take 1 tablet (20 mg total) by mouth every Monday, Wednesday, and Friday. 06/05/22   Antonieta Iba, MD  metFORMIN  (GLUCOPHAGE-XR) 500 MG 24 hr tablet Take 500 mg by mouth in the morning and 1000 mg at hight. 11/23/19   [provider]  Multiple Vitamin (MULTIVITAMIN WITH MINERALS) TABS tablet Take 1 tablet by mouth daily.    [provider]  polyethylene glycol (MIRALAX) 17 g packet Take 17 g by mouth daily. 09/09/22   Marrion Coy, MD  potassium chloride (KLOR-CON) 10 MEQ tablet Take 1 tablet (10 mEq total) by mouth every Monday, Wednesday, and Friday. 06/05/22   Antonieta Iba, MD    Physical Exam: Vitals:   02/28/23 0130 02/28/23 0341 02/28/23 0400 02/28/23 0430  BP: (!) 160/65  123/80 (!) 153/85  Pulse: 68  69 71  Resp: 14  13 12   Temp:  98.6 F (37 C)    TempSrc:  Oral    SpO2: 100%  98% 99%  Weight:      Height:       Physical Exam Vitals and nursing note reviewed.  Constitutional:      General: She is not in acute distress. HENT:     Head: Normocephalic and atraumatic.  Cardiovascular:     Rate and Rhythm: Normal rate and regular rhythm.     Heart sounds: Normal heart sounds.  Pulmonary:     Effort: Pulmonary effort is normal.     Breath sounds: Normal breath sounds.  Abdominal:     Palpations: Abdomen is soft.     Tenderness: There is no abdominal tenderness.  Neurological:     Mental Status: Mental status is at baseline.     Labs on Admission: I have personally reviewed following labs and imaging studies  CBC: Recent Labs  Lab 02/27/23 1824  WBC 8.8  HGB 10.4*  HCT 32.1*  MCV 94.4  PLT 342   Basic Metabolic Panel: Recent Labs  Lab 02/27/23 1824  NA 133*  K 3.2*  CL 100  CO2 21*  GLUCOSE 114*  BUN 12  CREATININE 0.75  CALCIUM 9.4   GFR: Estimated Creatinine Clearance: 73.2 mL/min (by C-G formula based on SCr of 0.75 mg/dL). Liver Function Tests: No results for input(s): "AST", "ALT", "ALKPHOS", "BILITOT", "PROT", "ALBUMIN" in the last 168 hours. No results for input(s): "LIPASE", "AMYLASE" in the last 168 hours. No results for  input(s): "AMMONIA" in the last 168 hours. Coagulation Profile: Recent Labs  Lab 02/28/23 0031  INR 1.0   Cardiac Enzymes: No results for input(s): "CKTOTAL", "CKMB", "CKMBINDEX", "TROPONINI" in the last 168 hours. BNP (last 3 results) No results for input(s): "PROBNP" in the last 8760 hours. HbA1C: No results for input(s): "HGBA1C" in the last 72 hours. CBG: No results for input(s): "GLUCAP" in the last 168 hours. Lipid Profile: No results for input(s): "CHOL", "HDL", "LDLCALC", "TRIG", "CHOLHDL", "LDLDIRECT" in the last 72 hours. Thyroid Function  Tests: No results for input(s): "TSH", "T4TOTAL", "FREET4", "T3FREE", "THYROIDAB" in the last 72 hours. Anemia Panel: No results for input(s): "VITAMINB12", "FOLATE", "FERRITIN", "TIBC", "IRON", "RETICCTPCT" in the last 72 hours. Urine analysis:    Component Value Date/Time   COLORURINE STRAW (A) 02/10/2023 1834   APPEARANCEUR CLEAR (A) 02/10/2023 1834   APPEARANCEUR Clear 02/19/2015 1647   LABSPEC 1.003 (L) 02/10/2023 1834   LABSPEC 1.025 02/19/2015 1647   PHURINE 6.0 02/10/2023 1834   GLUCOSEU NEGATIVE 02/10/2023 1834   GLUCOSEU Negative 02/19/2015 1647   HGBUR NEGATIVE 02/10/2023 1834   BILIRUBINUR NEGATIVE 02/10/2023 1834   BILIRUBINUR Negative 02/19/2015 1647   KETONESUR NEGATIVE 02/10/2023 1834   PROTEINUR NEGATIVE 02/10/2023 1834   NITRITE NEGATIVE 02/10/2023 1834   LEUKOCYTESUR NEGATIVE 02/10/2023 1834   LEUKOCYTESUR Trace 02/19/2015 1647    Radiological Exams on Admission: CT ANGIO HEAD NECK W WO CM  Result Date: 02/28/2023 CLINICAL DATA:  Initial evaluation for intermittent dizziness, right facial numbness. EXAM: CT ANGIOGRAPHY HEAD AND NECK WITH AND WITHOUT CONTRAST TECHNIQUE: Multidetector CT imaging of the head and neck was performed using the standard protocol during bolus administration of intravenous contrast. Multiplanar CT image reconstructions and MIPs were obtained to evaluate the vascular anatomy. Carotid  stenosis measurements (when applicable) are obtained utilizing NASCET criteria, using the distal internal carotid diameter as the denominator. RADIATION DOSE REDUCTION: This exam was performed according to the departmental dose-optimization program which includes automated exposure control, adjustment of the mA and/or kV according to patient size and/or use of iterative reconstruction technique. CONTRAST:  OMNIPAQUE IOHEXOL 350 MG/ML SOLN COMPARISON:  Prior study from 02/10/2023. FINDINGS: CT HEAD FINDINGS Brain: Cerebral volume within normal limits for patient age. No evidence for acute intracranial hemorrhage. No findings to suggest acute large vessel territory infarct. No mass lesion, midline shift, or mass effect. Ventricles are normal in size without evidence for hydrocephalus. No extra-axial fluid collection identified. Vascular: No hyperdense vessel identified. Skull: Scalp soft tissues demonstrate no acute abnormality. Calvarium intact. Sinuses/Orbits: Globes and orbital soft tissues within normal limits. Visualized paranasal sinuses are clear. No mastoid effusion. CTA NECK FINDINGS Aortic arch: Visualized aortic arch normal caliber with standard branch pattern. No stenosis about the origin the great vessels. Mild atheromatous change about the arch itself. Right carotid system: Right common and internal carotid arteries are patent without dissection. Calcified plaque about the right carotid bulb/proximal right ICA with associated stenosis of up to 50% by NASCET criteria, stable. Left carotid system: Left common and internal carotid arteries patent without evidence for dissection. Interval stenting across the left carotid bulb. Patent flow through the stent without visible intra stent thrombus. Narrowing at the mid aspect of the stent with residual 50% stenosis (series 10, image 205). Vertebral arteries: Both vertebral arteries arise from subclavian arteries. No proximal subclavian artery stenosis.  Vertebral arteries are patent without stenosis or dissection. Skeleton: No discrete or worrisome osseous lesions. Mild-to-moderate multilevel cervical spondylosis without significant spinal stenosis. Other neck: No other acute soft tissue abnormality within the neck. Upper chest: Visualized upper chest demonstrates no acute finding. Review of the MIP images confirms the above findings CTA HEAD FINDINGS Anterior circulation: Both internal carotid arteries remain patent through the siphons to the termini without stenosis. A1 segments patent bilaterally. Normal anterior cute artery complex. Anterior cerebral arteries patent without stenosis. No M1 stenosis or occlusion. No proximal MCA branch occlusion or high-grade stenosis. Distal MCA branches remain patent and perfused bilaterally. Posterior circulation: Both V4 segments patent without  stenosis. Right PICA patent. Left PICA origin not seen. Basilar markedly diminutive but patent to its distal aspect without focal stenosis. Superior cerebral arteries patent bilaterally. Fetal type origin of the PCAs, both of which remain patent to their distal aspects without significant stenosis. Venous sinuses: Patent allowing for timing the contrast bolus. Anatomic variants: Fetal type origin of the PCAs with overall diminutive vertebrobasilar system. No aneurysm. Review of the MIP images confirms the above findings IMPRESSION: 1. Negative CTA for large vessel occlusion or other emergent finding. 2. Interval stenting across the left carotid bulb. Patent flow through the stent without visible intra stent thrombus or other complication. Narrowing at the mid aspect of the stent with residual 50% stenosis, previously 70% on 02/10/2023. 3. Atheromatous plaque about the right carotid bulb/proximal right ICA with associated stenosis of up to 50% by NASCET criteria, stable. 4. Fetal type origin of the PCAs with overall diminutive vertebrobasilar system. 5. No other acute intracranial  abnormality . Electronically Signed   By: Rise Mu M.D.   On: 02/28/2023 02:52   CT Angio Chest PE W and/or Wo Contrast  Result Date: 02/28/2023 CLINICAL DATA:  Shortness of breath EXAM: CT ANGIOGRAPHY CHEST WITH CONTRAST TECHNIQUE: Multidetector CT imaging of the chest was performed using the standard protocol during bolus administration of intravenous contrast. Multiplanar CT image reconstructions and MIPs were obtained to evaluate the vascular anatomy. RADIATION DOSE REDUCTION: This exam was performed according to the departmental dose-optimization program which includes automated exposure control, adjustment of the mA and/or kV according to patient size and/or use of iterative reconstruction technique. CONTRAST:  OMNIPAQUE IOHEXOL 350 MG/ML SOLN COMPARISON:  Chest x-ray earlier tonight. FINDINGS: Cardiovascular: Heart is normal size. Aorta is normal caliber. No filling defects in the pulmonary arteries to suggest pulmonary emboli. Moderate coronary artery calcifications, most pronounced in LAD. Mediastinum/Nodes: No mediastinal, hilar, or axillary adenopathy. Trachea and esophagus are unremarkable. Thyroid unremarkable. Lungs/Pleura: Lungs are clear. No focal airspace opacities or suspicious nodules. No effusions. Upper Abdomen: No acute findings Musculoskeletal: Chest wall soft tissues are unremarkable. No acute bony abnormality. Review of the MIP images confirms the above findings. IMPRESSION: No evidence of pulmonary embolus. No acute cardiopulmonary disease. Coronary artery disease. Electronically Signed   By: Charlett Nose M.D.   On: 02/28/2023 02:03   DG Chest 1 View  Result Date: 02/27/2023 CLINICAL DATA:  Shortness of breath EXAM: CHEST  1 VIEW COMPARISON:  Chest radiograph dated 06/03/2022 FINDINGS: Normal lung volumes. No focal consolidations. No pleural effusion or pneumothorax. The heart size and mediastinal contours are within normal limits. The visualized skeletal  structures are unremarkable. IMPRESSION: No active disease. Electronically Signed   By: Agustin Cree M.D.   On: 02/27/2023 19:10     Data Reviewed: Relevant notes from primary care and specialist visits, past discharge summaries as available in EHR, including Care Everywhere. Prior diagnostic testing as pertinent to current admission diagnoses Updated medications and problem lists for reconciliation ED course, including vitals, labs, imaging, treatment and response to treatment Triage notes, nursing and pharmacy notes and ED provider's notes Notable results as noted in HPI   Assessment and Plan:   NSTEMI - Continue heparin infusion - Aspirin, Coreg, statin -- Holding Plavix as patient is on heparin infusion  Left ICA stenosis S/p left carotid stent on 02/12/23 -CTA head/neck showed shows improvement from 70% to 50% --continue on aspirin, plavix.     Hypokalemia:  --monitored and repleted PRN   HLD:  continue on  statin    HTN:  continue on coreg, lasix     DM2, well controlled Sliding scale insulin coverage     DVT prophylaxis: Heparin infusion  Consults: [facility] cardiology, Dr. Duke Salvia  Advance Care Planning:   Code Status: Prior   Family Communication: none  Disposition Plan: Back to previous home environment  Severity of Illness: The appropriate patient status for this patient is INPATIENT. Inpatient status is judged to be reasonable and necessary in order to provide the required intensity of service to ensure the patient's safety. The patient's presenting symptoms, physical exam findings, and initial radiographic and laboratory data in the context of their chronic comorbidities is felt to place them at high risk for further clinical deterioration. Furthermore, it is not anticipated that the patient will be medically stable for discharge from the hospital within 2 midnights of admission.   * I certify that at the point of admission it is my clinical judgment that the  patient will require inpatient hospital care spanning beyond 2 midnights from the point of admission due to high intensity of service, high risk for further deterioration and high frequency of surveillance required.*  Author: Andris Baumann, MD 02/28/2023 4:44 AM  For on call review [email or web address].

## 2023-02-28 NOTE — ED Notes (Signed)
Helped pt up to void in the bedside commode. Pt back in bed with call bell in reach. No other needs at this time.

## 2023-02-28 NOTE — Progress Notes (Signed)
ANTICOAGULATION CONSULT NOTE - Initial Consult  Pharmacy Consult for Heparin  Indication: chest pain/ACS  Allergies  Allergen Reactions   Latex Hives and Rash    Other reaction(s): Other (See Comments)   Lidocaine Hives   Phenobarbital-Belladonna Alk     Other reaction(s): Hives & Rash   Phenobarbital-Belladonna Alk Hives and Rash    Patient Measurements: Height: 5\' 6"  (167.6 cm) Weight: 90.7 kg (200 lb) IBW/kg (Calculated) : 59.3 Heparin Dosing Weight: 79.1 kg   Vital Signs: Temp: 98.5 F (36.9 C) (04/12 1750) Temp Source: Oral (04/12 1750) BP: 138/72 (04/12 1725) Pulse Rate: 73 (04/12 1725)  Labs: Recent Labs    02/27/23 1824 02/27/23 2321 02/28/23 0031 02/28/23 0953 02/28/23 1048 02/28/23 1944  HGB 10.4*  --   --   --   --   --   HCT 32.1*  --   --   --   --   --   PLT 342  --   --   --   --   --   APTT  --   --  26  --   --   --   LABPROT  --   --  13.3  --   --   --   INR  --   --  1.0  --   --   --   HEPARINUNFRC  --   --   --   --  0.51 0.52  CREATININE 0.75  --   --   --   --   --   TROPONINIHS 12 127*  --  51*  --   --      Estimated Creatinine Clearance: 73.2 mL/min (by C-G formula based on SCr of 0.75 mg/dL).   Medical History: Past Medical History:  Diagnosis Date   Arthritis    Asthma    Diabetes mellitus    GERD (gastroesophageal reflux disease)    Hyperlipidemia    Hypertension    Mini stroke    Stroke     Medications:  No evidence of PTA anticoagulation  Assessment: Pharmacy consulted to dose heparin in this 72 year old female admitted with ACS/NSTEMI.  No prior anticoag noted.  CrCl = 73.2 ml/min   Baseline Labs: aPTT - 26; INR - 1.0 Hgb - 10.4; Plts - 342  Goal of Therapy:  Heparin level 0.3-0.7 units/ml Monitor platelets by anticoagulation protocol: Yes  Date Time aPTT/HL Rate/Comment 4/12 1048 --/0.51  Therapeutic x 1, 950 units/hr 4/12 1944 0.52  Therapeutic x 2     Plan:  Continue heparin infusion at 950  units/hr Check anti-Xa level with AM labs Continue to monitor H&H and platelets daily while on heparin gtt.     Orson Aloe 02/28/2023,8:32 PM

## 2023-02-28 NOTE — ED Notes (Signed)
Patient transported to CT 

## 2023-02-28 NOTE — ED Notes (Signed)
Patient returned from CT. Requested to use the bathroom. Pt assisted to toilet in room to void. RN remained at pt side d/t heparin drip. Pt then assisted back into bed and positioned for comfort. Full cardiac monitor put back in place. Blankets reapplied to pt and hospital socks placed per pt request. Pt spouse also provided with 2 warm blankets per his request. Pt denies pain or other needs at this time. Pt resting in bed free from sign of distress. Bed low and locked with side rails raised x2. Call bell in reach.

## 2023-02-28 NOTE — Care Plan (Signed)
This 72 years old female with PMH significant for type 2 diabetes, hypertension, prior CVA recently hospitalized from 02/10/23 to 02/13/23 with TIA with workup showing 70% left ICA stenosis.  She underwent left carotid stent placement on 02/12/2023.  She presented in the ED with complaints of shortness of breath and lightheadedness, feeling like she was about to pass out.  ED workup reveals BP 150/73 troponin 12> 127.  EKG normal sinus rhythm no ST-T wave changes.  CTA head negative for large vessel occlusion.  Chest x-ray nonacute.  Patient is admitted for NSTEMI and started on IV heparin.  Cardiology is consulted.  Does not appear active coronary artery disease could be demand ischemia.  If troponin trending up may need additional inpatient ischemic workup.  If flat or trending down consider outpatient ischemic workup.  Obtain limited echocardiogram. Patient was seen and examined at bedside.  She reports feeling better.

## 2023-02-28 NOTE — ED Notes (Signed)
Went to introduce self to pt but pt and partner are sleeping so didn't want to wake them. Rise and fall of chest noted on pt.

## 2023-03-01 DIAGNOSIS — I214 Non-ST elevation (NSTEMI) myocardial infarction: Secondary | ICD-10-CM | POA: Diagnosis not present

## 2023-03-01 DIAGNOSIS — I10 Essential (primary) hypertension: Secondary | ICD-10-CM

## 2023-03-01 DIAGNOSIS — I6521 Occlusion and stenosis of right carotid artery: Secondary | ICD-10-CM

## 2023-03-01 LAB — CBC
HCT: 30.9 % — ABNORMAL LOW (ref 36.0–46.0)
Hemoglobin: 9.8 g/dL — ABNORMAL LOW (ref 12.0–15.0)
MCH: 29.8 pg (ref 26.0–34.0)
MCHC: 31.7 g/dL (ref 30.0–36.0)
MCV: 93.9 fL (ref 80.0–100.0)
Platelets: 289 10*3/uL (ref 150–400)
RBC: 3.29 MIL/uL — ABNORMAL LOW (ref 3.87–5.11)
RDW: 13.9 % (ref 11.5–15.5)
WBC: 8.9 10*3/uL (ref 4.0–10.5)
nRBC: 0 % (ref 0.0–0.2)

## 2023-03-01 LAB — GLUCOSE, CAPILLARY
Glucose-Capillary: 115 mg/dL — ABNORMAL HIGH (ref 70–99)
Glucose-Capillary: 136 mg/dL — ABNORMAL HIGH (ref 70–99)
Glucose-Capillary: 131 mg/dL — ABNORMAL HIGH (ref 70–99)
Glucose-Capillary: 132 mg/dL — ABNORMAL HIGH (ref 70–99)

## 2023-03-01 LAB — HEPARIN LEVEL (UNFRACTIONATED): Heparin Unfractionated: 0.61 IU/mL (ref 0.30–0.70)

## 2023-03-01 MED ORDER — NON FORMULARY: 80.0000 mg | Freq: Every day | Status: DC

## 2023-03-01 MED ORDER — VALSARTAN 80 MG PO TABS: 80.0000 mg | ORAL_TABLET | Freq: Every day | ORAL | 0 refills | Status: DC

## 2023-03-01 MED ORDER — CLOPIDOGREL BISULFATE 75 MG PO TABS
75.0000 mg | ORAL_TABLET | Freq: Every day | ORAL | Status: DC
  Administered 2023-03-01: 75 mg via ORAL
  Filled 2023-03-01: qty 1

## 2023-03-01 MED ORDER — IRBESARTAN 150 MG PO TABS
75.0000 mg | ORAL_TABLET | Freq: Every day | ORAL | Status: DC
  Administered 2023-03-01: 75 mg via ORAL
  Filled 2023-03-01: qty 1

## 2023-03-01 NOTE — Progress Notes (Signed)
ANTICOAGULATION CONSULT NOTE - Initial Consult  Pharmacy Consult for Heparin  Indication: chest pain/ACS  Allergies  Allergen Reactions   Latex Hives and Rash    Other reaction(s): Other (See Comments)   Lidocaine Hives   Phenobarbital-Belladonna Alk     Other reaction(s): Hives & Rash   Phenobarbital-Belladonna Alk Hives and Rash    Patient Measurements: Height: 5\' 6"  (167.6 cm) Weight: 90.7 kg (200 lb) IBW/kg (Calculated) : 59.3 Heparin Dosing Weight: 79.1 kg   Vital Signs: Temp: 98 F (36.7 C) (04/13 0406) BP: 110/68 (04/13 0406) Pulse Rate: 73 (04/13 0406)  Labs: Recent Labs    02/27/23 1824 02/27/23 2321 02/28/23 0031 02/28/23 0953 02/28/23 1048 02/28/23 1944 03/01/23 0457  HGB 10.4*  --   --   --   --   --  9.8*  HCT 32.1*  --   --   --   --   --  30.9*  PLT 342  --   --   --   --   --  289  APTT  --   --  26  --   --   --   --   LABPROT  --   --  13.3  --   --   --   --   INR  --   --  1.0  --   --   --   --   HEPARINUNFRC  --   --   --   --  0.51 0.52 0.61  CREATININE 0.75  --   --   --   --   --   --   TROPONINIHS 12 127*  --  51*  --   --   --      Estimated Creatinine Clearance: 73.2 mL/min (by C-G formula based on SCr of 0.75 mg/dL).   Medical History: Past Medical History:  Diagnosis Date   Arthritis    Asthma    Diabetes mellitus    GERD (gastroesophageal reflux disease)    Hyperlipidemia    Hypertension    Mini stroke    Stroke     Medications:  No evidence of PTA anticoagulation  Assessment: Pharmacy consulted to dose heparin in this 72 year old female admitted with ACS/NSTEMI.  No prior anticoag noted.  CrCl = 73.2 ml/min   Baseline Labs: aPTT - 26; INR - 1.0 Hgb - 10.4; Plts - 342  Goal of Therapy:  Heparin level 0.3-0.7 units/ml Monitor platelets by anticoagulation protocol: Yes  Date Time aPTT/HL Rate/Comment 4/12 1048 --/0.51  Therapeutic x 1, 950 units/hr 4/12 1944 0.52  Therapeutic x 2 4/13     0457   0.61                  Therapeutic X 3      Plan:  Continue heparin infusion at 950 units/hr Check anti-Xa level with AM labs Continue to monitor H&H and platelets daily while on heparin gtt.     Timber Marshman D 03/01/2023,5:57 AM

## 2023-03-01 NOTE — Progress Notes (Signed)
Rounding Note   Patient Name: Destiny Savage Date of Encounter: 03/01/2023  [facility] Cardiologist: Julien Nordmann, MD   Subjective   Feeling OK.  Ambulated and she had low energy but no chest pain or shortness of breath.  Inpatient Medications    Scheduled Meds:  aspirin EC  81 mg Oral Daily   atorvastatin  40 mg Oral Daily   carvedilol  6.25 mg Oral BID WC   furosemide  20 mg Oral Q M,W,F   insulin aspart  0-15 Units Subcutaneous Q4H   Continuous Infusions:  heparin 950 Units/hr (02/28/23 2317)   PRN Meds: acetaminophen, albuterol, nitroGLYCERIN, ondansetron (ZOFRAN) IV   Vital Signs    Vitals:   02/28/23 2337 03/01/23 0406 03/01/23 0740 03/01/23 0827  BP: 121/71 110/68 138/80 (!) 144/76  Pulse: 74 73 73 76  Resp: 20 18 19 18   Temp: 98.5 F (36.9 C) 98 F (36.7 C) 98.2 F (36.8 C) 98.3 F (36.8 C)  TempSrc:   Oral   SpO2: 100% 100% 100% 100%  Weight:      Height:        Intake/Output Summary (Last 24 hours) at 03/01/2023 1153 Last data filed at 03/01/2023 1040 Gross per 24 hour  Intake 666.03 ml  Output --  Net 666.03 ml      02/27/2023    6:39 PM 02/10/2023    7:39 PM 02/04/2023    8:56 PM  Last 3 Weights  Weight (lbs) 200 lb 197 lb 12 oz 200 lb 9.9 oz  Weight (kg) 90.719 kg 89.7 kg 91 kg      Telemetry    Sinus rhythm.  No events- Personally Reviewed  ECG    03/01/23: Sinus rhythm.  Rate 77 bpm. - Personally Reviewed  Physical Exam   VS:  BP (!) 144/76 (BP Location: Left Arm)   Pulse 76   Temp 98.3 F (36.8 C)   Resp 18   Ht 5\' 6"  (1.676 m)   Wt 90.7 kg   SpO2 100%   BMI 32.28 kg/m  , BMI Body mass index is 32.28 kg/m. GENERAL:  Well appearing HEENT: Pupils equal round and reactive, fundi not visualized, oral mucosa unremarkable NECK:  No jugular venous distention, waveform within normal limits, carotid upstroke brisk and symmetric, no bruits, no thyromegaly LUNGS:  Clear to auscultation bilaterally HEART:  RRR.  PMI  not displaced or sustained,S1 and S2 within normal limits, no S3, no S4, no clicks, no rubs, no murmurs ABD:  Flat, positive bowel sounds normal in frequency in pitch, no bruits, no rebound, no guarding, no midline pulsatile mass, no hepatomegaly, no splenomegaly EXT:  2 plus pulses throughout, no edema, no cyanosis no clubbing SKIN:  No rashes no nodules NEURO:  Cranial nerves II through XII grossly intact, motor grossly intact throughout PSYCH:  Cognitively intact, oriented to person place and time  Labs    High Sensitivity Troponin:   Recent Labs  Lab 02/27/23 1824 02/27/23 2321 02/28/23 0953  TROPONINIHS 12 127* 51*     Chemistry Recent Labs  Lab 02/27/23 1824  NA 133*  K 3.2*  CL 100  CO2 21*  GLUCOSE 114*  BUN 12  CREATININE 0.75  CALCIUM 9.4  GFRNONAA >60  ANIONGAP 12    Lipids No results for input(s): "CHOL", "TRIG", "HDL", "LABVLDL", "LDLCALC", "CHOLHDL" in the last 168 hours.  Hematology Recent Labs  Lab 02/27/23 1824 03/01/23 0457  WBC 8.8 8.9  RBC 3.40* 3.29*  HGB 10.4* 9.8*  HCT 32.1* 30.9*  MCV 94.4 93.9  MCH 30.6 29.8  MCHC 32.4 31.7  RDW 13.8 13.9  PLT 342 289   Thyroid No results for input(s): "TSH", "FREET4" in the last 168 hours.  BNP Recent Labs  Lab 02/28/23 0031  BNP 30.9    DDimer No results for input(s): "DDIMER" in the last 168 hours.   Radiology    ECHOCARDIOGRAM LIMITED  Result Date: 02/28/2023    ECHOCARDIOGRAM LIMITED REPORT   Patient Name:   Destiny Savage Date of Exam: 02/28/2023 Medical Rec #:  [id number]       Height:       66.0 in Accession #:    [id number]      Weight:       200.0 lb Date of Birth:  05-13-1951       BSA:          2.000 m Patient Age:    72 years        BP:           116/76 mmHg Patient Gender: F               HR:           74 bpm. Exam Location:  [facility] Procedure: Limited Echo and Limited Color Doppler Indications:     CHF  History:         Patient has prior history of Echocardiogram examinations, most                   recent 01/08/2019. CHF, Acute MI, Stroke and TIA,                  Signs/Symptoms:Chest Pain and Shortness of Breath; Risk                  Factors:Hypertension, Diabetes and Dyslipidemia.  Sonographer:     Mikki Harbor Referring Phys:  [id number] Antonieta Iba Diagnosing Phys: Julien Nordmann MD IMPRESSIONS  1. Left ventricular ejection fraction, by estimation, is 60 to 65%. The left ventricle has normal function. The left ventricle has no regional wall motion abnormalities. There is mild left ventricular hypertrophy. Left ventricular diastolic parameters are consistent with Grade I diastolic dysfunction (impaired relaxation).  2. Right ventricular systolic function is normal. The right ventricular size is normal. Tricuspid regurgitation signal is inadequate for assessing PA pressure.  3. A small pericardial effusion is present.  4. The mitral valve is normal in structure. Mild mitral valve regurgitation. No evidence of mitral stenosis.  5. The aortic valve is tricuspid. Aortic valve regurgitation is not visualized. No aortic stenosis is present.  6. The inferior vena cava is normal in size with greater than 50% respiratory variability, suggesting right atrial pressure of 3 mmHg. FINDINGS  Left Ventricle: Left ventricular ejection fraction, by estimation, is 60 to 65%. The left ventricle has normal function. The left ventricle has no regional wall motion abnormalities. The left ventricular internal cavity size was normal in size. There is  mild left ventricular hypertrophy. Left ventricular diastolic parameters are consistent with Grade I diastolic dysfunction (impaired relaxation). Right Ventricle: The right ventricular size is normal. No increase in right ventricular wall thickness. Right ventricular systolic function is normal. Tricuspid regurgitation signal is inadequate for assessing PA pressure. Left Atrium: Left atrial size was normal in size. Right Atrium: Right atrial size was normal in size.  Pericardium: A small pericardial effusion is present. Mitral Valve: The mitral valve  is normal in structure. Mild mitral valve regurgitation. No evidence of mitral valve stenosis. Tricuspid Valve: The tricuspid valve is normal in structure. Tricuspid valve regurgitation is not demonstrated. No evidence of tricuspid stenosis. Aortic Valve: The aortic valve is tricuspid. Aortic valve regurgitation is not visualized. No aortic stenosis is present. Pulmonic Valve: The pulmonic valve was normal in structure. Pulmonic valve regurgitation is not visualized. No evidence of pulmonic stenosis. Aorta: The aortic root is normal in size and structure. Venous: The inferior vena cava is normal in size with greater than 50% respiratory variability, suggesting right atrial pressure of 3 mmHg. IAS/Shunts: No atrial level shunt detected by color flow Doppler. Additional Comments: Spectral Doppler performed. Color Doppler performed.  LEFT VENTRICLE PLAX 2D LVIDd:         3.90 cm LVIDs:         2.00 cm LV PW:         1.00 cm LV IVS:        1.00 cm LVOT diam:     1.90 cm LVOT Area:     2.84 cm  LEFT ATRIUM             Index LA diam:        3.00 cm 1.50 cm/m LA Vol (A2C):   49.6 ml 24.80 ml/m LA Vol (A4C):   28.3 ml 14.15 ml/m LA Biplane Vol: 38.5 ml 19.25 ml/m   AORTA Ao Root diam: 3.10 cm MITRAL VALVE MV Area (PHT): 3.37 cm    SHUNTS MV Decel Time: 225 msec    Systemic Diam: 1.90 cm MV E velocity: 69.20 cm/s MV A velocity: 87.00 cm/s MV E/A ratio:  0.80 Julien Nordmann MD Electronically signed by Julien Nordmann MD Signature Date/Time: 02/28/2023/4:17:13 PM    Final    CT ANGIO HEAD NECK W WO CM  Result Date: 02/28/2023 CLINICAL DATA:  Initial evaluation for intermittent dizziness, right facial numbness. EXAM: CT ANGIOGRAPHY HEAD AND NECK WITH AND WITHOUT CONTRAST TECHNIQUE: Multidetector CT imaging of the head and neck was performed using the standard protocol during bolus administration of intravenous contrast. Multiplanar CT  image reconstructions and MIPs were obtained to evaluate the vascular anatomy. Carotid stenosis measurements (when applicable) are obtained utilizing NASCET criteria, using the distal internal carotid diameter as the denominator. RADIATION DOSE REDUCTION: This exam was performed according to the departmental dose-optimization program which includes automated exposure control, adjustment of the mA and/or kV according to patient size and/or use of iterative reconstruction technique. CONTRAST:  OMNIPAQUE IOHEXOL 350 MG/ML SOLN COMPARISON:  Prior study from 02/10/2023. FINDINGS: CT HEAD FINDINGS Brain: Cerebral volume within normal limits for patient age. No evidence for acute intracranial hemorrhage. No findings to suggest acute large vessel territory infarct. No mass lesion, midline shift, or mass effect. Ventricles are normal in size without evidence for hydrocephalus. No extra-axial fluid collection identified. Vascular: No hyperdense vessel identified. Skull: Scalp soft tissues demonstrate no acute abnormality. Calvarium intact. Sinuses/Orbits: Globes and orbital soft tissues within normal limits. Visualized paranasal sinuses are clear. No mastoid effusion. CTA NECK FINDINGS Aortic arch: Visualized aortic arch normal caliber with standard branch pattern. No stenosis about the origin the great vessels. Mild atheromatous change about the arch itself. Right carotid system: Right common and internal carotid arteries are patent without dissection. Calcified plaque about the right carotid bulb/proximal right ICA with associated stenosis of up to 50% by NASCET criteria, stable. Left carotid system: Left common and internal carotid arteries patent without evidence for dissection.  Interval stenting across the left carotid bulb. Patent flow through the stent without visible intra stent thrombus. Narrowing at the mid aspect of the stent with residual 50% stenosis (series 10, image 205). Vertebral arteries: Both  vertebral arteries arise from subclavian arteries. No proximal subclavian artery stenosis. Vertebral arteries are patent without stenosis or dissection. Skeleton: No discrete or worrisome osseous lesions. Mild-to-moderate multilevel cervical spondylosis without significant spinal stenosis. Other neck: No other acute soft tissue abnormality within the neck. Upper chest: Visualized upper chest demonstrates no acute finding. Review of the MIP images confirms the above findings CTA HEAD FINDINGS Anterior circulation: Both internal carotid arteries remain patent through the siphons to the termini without stenosis. A1 segments patent bilaterally. Normal anterior cute artery complex. Anterior cerebral arteries patent without stenosis. No M1 stenosis or occlusion. No proximal MCA branch occlusion or high-grade stenosis. Distal MCA branches remain patent and perfused bilaterally. Posterior circulation: Both V4 segments patent without stenosis. Right PICA patent. Left PICA origin not seen. Basilar markedly diminutive but patent to its distal aspect without focal stenosis. Superior cerebral arteries patent bilaterally. Fetal type origin of the PCAs, both of which remain patent to their distal aspects without significant stenosis. Venous sinuses: Patent allowing for timing the contrast bolus. Anatomic variants: Fetal type origin of the PCAs with overall diminutive vertebrobasilar system. No aneurysm. Review of the MIP images confirms the above findings IMPRESSION: 1. Negative CTA for large vessel occlusion or other emergent finding. 2. Interval stenting across the left carotid bulb. Patent flow through the stent without visible intra stent thrombus or other complication. Narrowing at the mid aspect of the stent with residual 50% stenosis, previously 70% on 02/10/2023. 3. Atheromatous plaque about the right carotid bulb/proximal right ICA with associated stenosis of up to 50% by NASCET criteria, stable. 4. Fetal type origin of  the PCAs with overall diminutive vertebrobasilar system. 5. No other acute intracranial abnormality . Electronically Signed   By: Rise Mu M.D.   On: 02/28/2023 02:52   CT Angio Chest PE W and/or Wo Contrast  Result Date: 02/28/2023 CLINICAL DATA:  Shortness of breath EXAM: CT ANGIOGRAPHY CHEST WITH CONTRAST TECHNIQUE: Multidetector CT imaging of the chest was performed using the standard protocol during bolus administration of intravenous contrast. Multiplanar CT image reconstructions and MIPs were obtained to evaluate the vascular anatomy. RADIATION DOSE REDUCTION: This exam was performed according to the departmental dose-optimization program which includes automated exposure control, adjustment of the mA and/or kV according to patient size and/or use of iterative reconstruction technique. CONTRAST:  OMNIPAQUE IOHEXOL 350 MG/ML SOLN COMPARISON:  Chest x-ray earlier tonight. FINDINGS: Cardiovascular: Heart is normal size. Aorta is normal caliber. No filling defects in the pulmonary arteries to suggest pulmonary emboli. Moderate coronary artery calcifications, most pronounced in LAD. Mediastinum/Nodes: No mediastinal, hilar, or axillary adenopathy. Trachea and esophagus are unremarkable. Thyroid unremarkable. Lungs/Pleura: Lungs are clear. No focal airspace opacities or suspicious nodules. No effusions. Upper Abdomen: No acute findings Musculoskeletal: Chest wall soft tissues are unremarkable. No acute bony abnormality. Review of the MIP images confirms the above findings. IMPRESSION: No evidence of pulmonary embolus. No acute cardiopulmonary disease. Coronary artery disease. Electronically Signed   By: Charlett Nose M.D.   On: 02/28/2023 02:03   DG Chest 1 View  Result Date: 02/27/2023 CLINICAL DATA:  Shortness of breath EXAM: CHEST  1 VIEW COMPARISON:  Chest radiograph dated 06/03/2022 FINDINGS: Normal lung volumes. No focal consolidations. No pleural effusion or pneumothorax. The heart  size  and mediastinal contours are within normal limits. The visualized skeletal structures are unremarkable. IMPRESSION: No active disease. Electronically Signed   By: Agustin Cree M.D.   On: 02/27/2023 19:10    Cardiac Studies   TTE March 2024  1. Left ventricular ejection fraction, by estimation, is 65 to 70%. The  left ventricle has normal function. The left ventricle has no regional  wall motion abnormalities. Left ventricular diastolic parameters are  consistent with Grade I diastolic  dysfunction (impaired relaxation).   2. Right ventricular systolic function is normal. The right ventricular  size is normal. There is normal pulmonary artery systolic pressure.   3. The mitral valve is normal in structure. Mild mitral valve  regurgitation. No evidence of mitral stenosis.   4. The aortic valve is normal in structure. Aortic valve regurgitation is  not visualized. No aortic stenosis is present.   5. The inferior vena cava is normal in size with greater than 50%  respiratory variability, suggesting right atrial pressure of 3 mmHg.   6. Agitated saline contrast bubble study was negative, with no evidence  of any interatrial shunt.   Patient Profile     72 y.o. female s/p TIA/CVA, DM, carotid stneosis, HTN, HL, OSA not on CPAP admitted with shortness of breath and "fuzziness in head".  Assessment & Plan    # CVA/TIA: # Carotid stenosis: Patient discharged 02/05/2023 after acute onset expressive aphasia and slurred speech.  She was found to have 70% proximal left carotid artery stenosis and underwent stenting 02/12/2023.  This hospitalization she reported "feeling funny" "since discharge.  Symptoms seem to be improving.  Recommend stopping heparin and switching back to her aspirin and clopidogrel.  # Elevated cardiac enzymes: High-sensitivity troponin was mildly elevated.  She has no ischemic symptoms.  Chest CT did show coronary calcification.  Systolic function was normal on echo.  We will  plan to see her in clinic and consider getting a coronary CTA.  No urgent indication for intervention at this time.  EKG is stable.  # Hypertension: Blood pressure uncontrolled on admission in the 160s.  It is now been more stable in the 130s.  Ideally her blood pressure goal is to be less than 130/80.  Given that she also has diabetes, we will add valsartan 80 mg daily.  This will also help with her hypokalemia.  She will need repeat BMP in about a week.  [facility] will sign off.   Medication Recommendations: Valsartan 80 mg daily Other recommendations (labs, testing, etc): BMP 1 week.  Consider outpatient coronary CTA. Follow up as an outpatient: We will arrange  For questions or updates, please contact [facility] Please consult [email or web address] for contact info under        Signed, Chilton Si, MD  03/01/2023, 11:53 AM  ]

## 2023-03-01 NOTE — Discharge Summary (Signed)
Physician Discharge Summary  Destiny Savage UKG:[id number] DOB: 12/02/1950 DOA: 02/27/2023  PCP: Jaclyn Shaggy, MD  Admit date: 02/27/2023  Discharge date: 03/01/2023  Admitted From: Home  Disposition:  Home  Recommendations for Outpatient Follow-up:  Follow up with PCP in 1-2 weeks. Please obtain BMP/CBC in one week. Advised to follow up Cardiology as scheduled. Advised outpatient Coronary CT scan. Advised to take Valsartan 80 mg daily.  Home Health: None Equipment/Devices:None  Discharge Condition:Stable CODE STATUS:Full code Diet recommendation: Heart Healthy   Brief [facility] Course: This 72 years old female with PMH significant for type 2 diabetes, hypertension, prior CVA recently hospitalized from 02/10/23 to 02/13/23 with TIA with workup showing 70% left ICA stenosis.  She underwent left carotid stent placement on 02/12/2023.  She presented in the ED with complaints of shortness of breath and lightheadedness, feeling like she was about to pass out.  ED workup reveals BP 150/73.  troponin 12> 127.  EKG normal sinus rhythm,  No ST-T wave changes.CTA head negative for large vessel occlusion. Chest x-ray Non acute.  Patient was admitted for NSTEMI and started on IV heparin.  Cardiology was consulted.  Does not appear active coronary artery disease,  could be demand ischemia. Troponin trended down and back to normal. Limited Echo completed and it was WNL. LVEF 60-65%, No RWMA,  Patient was evaluated by cardiology and cleared for discharge.  Patient was resumed back on Aspirin and Plavix. Patient is started on Valsartan 80 mg for better BP control. Patient is being discharged home.  Discharge Diagnoses:  Principal Problem:   NSTEMI (non-ST elevated myocardial infarction) Active Problems:   Internal carotid artery stenosis, left   Diabetes type 2, controlled   Elevated troponin   Near syncope   Shortness of breath  Discharge Instructions  Discharge Instructions     Call  MD for:  difficulty breathing, headache or visual disturbances   Complete by: As directed    Call MD for:  persistant dizziness or light-headedness   Complete by: As directed    Call MD for:  persistant nausea and vomiting   Complete by: As directed    Diet - low sodium heart healthy   Complete by: As directed    Diet Carb Modified   Complete by: As directed    Discharge instructions   Complete by: As directed    Advised to follow up Cardiology as scheduled. Advised outpatient Coronary CT scan. Advised to take Valsartan 80 mg daily.   Increase activity slowly   Complete by: As directed       Allergies as of 03/01/2023       Reactions   Latex Hives, Rash   Other reaction(s): Other (See Comments)   Lidocaine Hives   Phenobarbital-belladonna Alk    Other reaction(s): Hives & Rash   Phenobarbital-belladonna Alk Hives, Rash        Medication List     STOP taking these medications    gabapentin 300 MG capsule Commonly known as: NEURONTIN   meloxicam 7.5 MG tablet Commonly known as: MOBIC   pantoprazole 40 MG tablet Commonly known as: PROTONIX       TAKE these medications    acetaminophen 500 MG tablet Commonly known as: TYLENOL Take 500-1,000 mg by mouth every 8 (eight) hours as needed.   albuterol 108 (90 Base) MCG/ACT inhaler Commonly known as: VENTOLIN HFA Inhale 1 puff into the lungs every 6 (six) hours as needed for wheezing.   aspirin 81 MG  tablet Take 1 tablet (81 mg total) by mouth daily.   atorvastatin 40 MG tablet Commonly known as: LIPITOR Take 1 tablet (40 mg total) by mouth daily.   carvedilol 6.25 MG tablet Commonly known as: COREG Take 1 tablet (6.25 mg total) by mouth 2 (two) times daily with a meal.   cholecalciferol 25 MCG (1000 UNIT) tablet Commonly known as: VITAMIN D3 Take 1,000 Units by mouth daily.   clopidogrel 75 MG tablet Commonly known as: PLAVIX Take 1 tablet (75 mg total) by mouth daily.   furosemide 20 MG  tablet Commonly known as: LASIX Take 1 tablet (20 mg total) by mouth every Monday, Wednesday, and Friday.   metFORMIN 500 MG 24 hr tablet Commonly known as: GLUCOPHAGE-XR Take 500 mg by mouth in the morning and 1000 mg at hight.   methocarbamol 500 MG tablet Commonly known as: ROBAXIN Take 500 mg by mouth every 8 (eight) hours as needed for muscle spasms.   multivitamin with minerals Tabs tablet Take 1 tablet by mouth daily.   oxyCODONE-acetaminophen 5-325 MG tablet Commonly known as: PERCOCET/ROXICET Take 1 tablet by mouth every 8 (eight) hours as needed for severe pain.   polyethylene glycol 17 g packet Commonly known as: MiraLax Take 17 g by mouth daily.   potassium chloride 10 MEQ tablet Commonly known as: KLOR-CON Take 1 tablet (10 mEq total) by mouth every Monday, Wednesday, and Friday.   valsartan 80 MG tablet Commonly known as: Diovan Take 1 tablet (80 mg total) by mouth daily. Start taking on: March 02, 2023        Allergies  Allergen Reactions   Latex Hives and Rash    Other reaction(s): Other (See Comments)   Lidocaine Hives   Phenobarbital-Belladonna Alk     Other reaction(s): Hives & Rash   Phenobarbital-Belladonna Alk Hives and Rash    Consultations: Cardiology   Procedures/Studies: ECHOCARDIOGRAM LIMITED  Result Date: 02/28/2023    ECHOCARDIOGRAM LIMITED REPORT   Patient Name:   Destiny Savage Date of Exam: 02/28/2023 Medical Rec #:  [id number]       Height:       66.0 in Accession #:    [id number]      Weight:       200.0 lb Date of Birth:  October 17, 1951       BSA:          2.000 m Patient Age:    72 years        BP:           116/76 mmHg Patient Gender: F               HR:           74 bpm. Exam Location:  [facility] Procedure: Limited Echo and Limited Color Doppler Indications:     CHF  History:         Patient has prior history of Echocardiogram examinations, most                  recent 01/08/2019. CHF, Acute MI, Stroke and TIA,                   Signs/Symptoms:Chest Pain and Shortness of Breath; Risk                  Factors:Hypertension, Diabetes and Dyslipidemia.  Sonographer:     Mikki Harbor Referring Phys:  [id number] Antonieta Iba Diagnosing Phys: Julien Nordmann MD IMPRESSIONS  1. Left  ventricular ejection fraction, by estimation, is 60 to 65%. The left ventricle has normal function. The left ventricle has no regional wall motion abnormalities. There is mild left ventricular hypertrophy. Left ventricular diastolic parameters are consistent with Grade I diastolic dysfunction (impaired relaxation).  2. Right ventricular systolic function is normal. The right ventricular size is normal. Tricuspid regurgitation signal is inadequate for assessing PA pressure.  3. A small pericardial effusion is present.  4. The mitral valve is normal in structure. Mild mitral valve regurgitation. No evidence of mitral stenosis.  5. The aortic valve is tricuspid. Aortic valve regurgitation is not visualized. No aortic stenosis is present.  6. The inferior vena cava is normal in size with greater than 50% respiratory variability, suggesting right atrial pressure of 3 mmHg. FINDINGS  Left Ventricle: Left ventricular ejection fraction, by estimation, is 60 to 65%. The left ventricle has normal function. The left ventricle has no regional wall motion abnormalities. The left ventricular internal cavity size was normal in size. There is  mild left ventricular hypertrophy. Left ventricular diastolic parameters are consistent with Grade I diastolic dysfunction (impaired relaxation). Right Ventricle: The right ventricular size is normal. No increase in right ventricular wall thickness. Right ventricular systolic function is normal. Tricuspid regurgitation signal is inadequate for assessing PA pressure. Left Atrium: Left atrial size was normal in size. Right Atrium: Right atrial size was normal in size. Pericardium: A small pericardial effusion is present. Mitral Valve: The mitral  valve is normal in structure. Mild mitral valve regurgitation. No evidence of mitral valve stenosis. Tricuspid Valve: The tricuspid valve is normal in structure. Tricuspid valve regurgitation is not demonstrated. No evidence of tricuspid stenosis. Aortic Valve: The aortic valve is tricuspid. Aortic valve regurgitation is not visualized. No aortic stenosis is present. Pulmonic Valve: The pulmonic valve was normal in structure. Pulmonic valve regurgitation is not visualized. No evidence of pulmonic stenosis. Aorta: The aortic root is normal in size and structure. Venous: The inferior vena cava is normal in size with greater than 50% respiratory variability, suggesting right atrial pressure of 3 mmHg. IAS/Shunts: No atrial level shunt detected by color flow Doppler. Additional Comments: Spectral Doppler performed. Color Doppler performed.  LEFT VENTRICLE PLAX 2D LVIDd:         3.90 cm LVIDs:         2.00 cm LV PW:         1.00 cm LV IVS:        1.00 cm LVOT diam:     1.90 cm LVOT Area:     2.84 cm  LEFT ATRIUM             Index LA diam:        3.00 cm 1.50 cm/m LA Vol (A2C):   49.6 ml 24.80 ml/m LA Vol (A4C):   28.3 ml 14.15 ml/m LA Biplane Vol: 38.5 ml 19.25 ml/m   AORTA Ao Root diam: 3.10 cm MITRAL VALVE MV Area (PHT): 3.37 cm    SHUNTS MV Decel Time: 225 msec    Systemic Diam: 1.90 cm MV E velocity: 69.20 cm/s MV A velocity: 87.00 cm/s MV E/A ratio:  0.80 Julien Nordmann MD Electronically signed by Julien Nordmann MD Signature Date/Time: 02/28/2023/4:17:13 PM    Final    CT ANGIO HEAD NECK W WO CM  Result Date: 02/28/2023 CLINICAL DATA:  Initial evaluation for intermittent dizziness, right facial numbness. EXAM: CT ANGIOGRAPHY HEAD AND NECK WITH AND WITHOUT CONTRAST TECHNIQUE: Multidetector CT imaging of the head  and neck was performed using the standard protocol during bolus administration of intravenous contrast. Multiplanar CT image reconstructions and MIPs were obtained to evaluate the vascular anatomy.  Carotid stenosis measurements (when applicable) are obtained utilizing NASCET criteria, using the distal internal carotid diameter as the denominator. RADIATION DOSE REDUCTION: This exam was performed according to the departmental dose-optimization program which includes automated exposure control, adjustment of the mA and/or kV according to patient size and/or use of iterative reconstruction technique. CONTRAST:  OMNIPAQUE IOHEXOL 350 MG/ML SOLN COMPARISON:  Prior study from 02/10/2023. FINDINGS: CT HEAD FINDINGS Brain: Cerebral volume within normal limits for patient age. No evidence for acute intracranial hemorrhage. No findings to suggest acute large vessel territory infarct. No mass lesion, midline shift, or mass effect. Ventricles are normal in size without evidence for hydrocephalus. No extra-axial fluid collection identified. Vascular: No hyperdense vessel identified. Skull: Scalp soft tissues demonstrate no acute abnormality. Calvarium intact. Sinuses/Orbits: Globes and orbital soft tissues within normal limits. Visualized paranasal sinuses are clear. No mastoid effusion. CTA NECK FINDINGS Aortic arch: Visualized aortic arch normal caliber with standard branch pattern. No stenosis about the origin the great vessels. Mild atheromatous change about the arch itself. Right carotid system: Right common and internal carotid arteries are patent without dissection. Calcified plaque about the right carotid bulb/proximal right ICA with associated stenosis of up to 50% by NASCET criteria, stable. Left carotid system: Left common and internal carotid arteries patent without evidence for dissection. Interval stenting across the left carotid bulb. Patent flow through the stent without visible intra stent thrombus. Narrowing at the mid aspect of the stent with residual 50% stenosis (series 10, image 205). Vertebral arteries: Both vertebral arteries arise from subclavian arteries. No proximal subclavian artery  stenosis. Vertebral arteries are patent without stenosis or dissection. Skeleton: No discrete or worrisome osseous lesions. Mild-to-moderate multilevel cervical spondylosis without significant spinal stenosis. Other neck: No other acute soft tissue abnormality within the neck. Upper chest: Visualized upper chest demonstrates no acute finding. Review of the MIP images confirms the above findings CTA HEAD FINDINGS Anterior circulation: Both internal carotid arteries remain patent through the siphons to the termini without stenosis. A1 segments patent bilaterally. Normal anterior cute artery complex. Anterior cerebral arteries patent without stenosis. No M1 stenosis or occlusion. No proximal MCA branch occlusion or high-grade stenosis. Distal MCA branches remain patent and perfused bilaterally. Posterior circulation: Both V4 segments patent without stenosis. Right PICA patent. Left PICA origin not seen. Basilar markedly diminutive but patent to its distal aspect without focal stenosis. Superior cerebral arteries patent bilaterally. Fetal type origin of the PCAs, both of which remain patent to their distal aspects without significant stenosis. Venous sinuses: Patent allowing for timing the contrast bolus. Anatomic variants: Fetal type origin of the PCAs with overall diminutive vertebrobasilar system. No aneurysm. Review of the MIP images confirms the above findings IMPRESSION: 1. Negative CTA for large vessel occlusion or other emergent finding. 2. Interval stenting across the left carotid bulb. Patent flow through the stent without visible intra stent thrombus or other complication. Narrowing at the mid aspect of the stent with residual 50% stenosis, previously 70% on 02/10/2023. 3. Atheromatous plaque about the right carotid bulb/proximal right ICA with associated stenosis of up to 50% by NASCET criteria, stable. 4. Fetal type origin of the PCAs with overall diminutive vertebrobasilar system. 5. No other acute  intracranial abnormality . Electronically Signed   By: Rise Mu M.D.   On: 02/28/2023 02:52   CT Angio  Chest PE W and/or Wo Contrast  Result Date: 02/28/2023 CLINICAL DATA:  Shortness of breath EXAM: CT ANGIOGRAPHY CHEST WITH CONTRAST TECHNIQUE: Multidetector CT imaging of the chest was performed using the standard protocol during bolus administration of intravenous contrast. Multiplanar CT image reconstructions and MIPs were obtained to evaluate the vascular anatomy. RADIATION DOSE REDUCTION: This exam was performed according to the departmental dose-optimization program which includes automated exposure control, adjustment of the mA and/or kV according to patient size and/or use of iterative reconstruction technique. CONTRAST:  OMNIPAQUE IOHEXOL 350 MG/ML SOLN COMPARISON:  Chest x-ray earlier tonight. FINDINGS: Cardiovascular: Heart is normal size. Aorta is normal caliber. No filling defects in the pulmonary arteries to suggest pulmonary emboli. Moderate coronary artery calcifications, most pronounced in LAD. Mediastinum/Nodes: No mediastinal, hilar, or axillary adenopathy. Trachea and esophagus are unremarkable. Thyroid unremarkable. Lungs/Pleura: Lungs are clear. No focal airspace opacities or suspicious nodules. No effusions. Upper Abdomen: No acute findings Musculoskeletal: Chest wall soft tissues are unremarkable. No acute bony abnormality. Review of the MIP images confirms the above findings. IMPRESSION: No evidence of pulmonary embolus. No acute cardiopulmonary disease. Coronary artery disease. Electronically Signed   By: Charlett Nose M.D.   On: 02/28/2023 02:03   DG Chest 1 View  Result Date: 02/27/2023 CLINICAL DATA:  Shortness of breath EXAM: CHEST  1 VIEW COMPARISON:  Chest radiograph dated 06/03/2022 FINDINGS: Normal lung volumes. No focal consolidations. No pleural effusion or pneumothorax. The heart size and mediastinal contours are within normal limits. The visualized  skeletal structures are unremarkable. IMPRESSION: No active disease. Electronically Signed   By: Agustin Cree M.D.   On: 02/27/2023 19:10   PERIPHERAL VASCULAR CATHETERIZATION  Result Date: 02/12/2023 See surgical note for result.  ECHOCARDIOGRAM COMPLETE BUBBLE STUDY  Result Date: 02/11/2023    ECHOCARDIOGRAM REPORT   Patient Name:   Destiny Savage Date of Exam: 02/11/2023 Medical Rec #:  [id number]       Height:       66.0 in Accession #:    [id number]      Weight:       197.8 lb Date of Birth:  05-Mar-1951       BSA:          1.990 m Patient Age:    72 years        BP:           174/75 mmHg Patient Gender: F               HR:           64 bpm. Exam Location:  [facility] Procedure: 2D Echo, Cardiac Doppler, Color Doppler and Saline Contrast Bubble            Study Indications:     Stroke 434.91 / I63.9  History:         Patient has prior history of Echocardiogram examinations, most                  recent 01/12/2019. Stroke; Risk Factors:Hypertension and                  Diabetes.  Sonographer:     Cristela Blue Referring Phys:  [id number] JAN A MANSY Diagnosing Phys: Lorine Bears MD IMPRESSIONS  1. Left ventricular ejection fraction, by estimation, is 65 to 70%. The left ventricle has normal function. The left ventricle has no regional wall motion abnormalities. Left ventricular diastolic parameters are consistent with Grade I diastolic dysfunction (impaired  relaxation).  2. Right ventricular systolic function is normal. The right ventricular size is normal. There is normal pulmonary artery systolic pressure.  3. The mitral valve is normal in structure. Mild mitral valve regurgitation. No evidence of mitral stenosis.  4. The aortic valve is normal in structure. Aortic valve regurgitation is not visualized. No aortic stenosis is present.  5. The inferior vena cava is normal in size with greater than 50% respiratory variability, suggesting right atrial pressure of 3 mmHg.  6. Agitated saline contrast bubble study was  negative, with no evidence of any interatrial shunt. FINDINGS  Left Ventricle: Left ventricular ejection fraction, by estimation, is 65 to 70%. The left ventricle has normal function. The left ventricle has no regional wall motion abnormalities. The left ventricular internal cavity size was normal in size. There is  no left ventricular hypertrophy. Left ventricular diastolic parameters are consistent with Grade I diastolic dysfunction (impaired relaxation). Right Ventricle: The right ventricular size is normal. No increase in right ventricular wall thickness. Right ventricular systolic function is normal. There is normal pulmonary artery systolic pressure. The tricuspid regurgitant velocity is 2.18 m/s, and  with an assumed right atrial pressure of 3 mmHg, the estimated right ventricular systolic pressure is 22.0 mmHg. Left Atrium: Left atrial size was normal in size. Right Atrium: Right atrial size was normal in size. Pericardium: There is no evidence of pericardial effusion. Mitral Valve: The mitral valve is normal in structure. There is mild thickening of the mitral valve leaflet(s). Mild mitral valve regurgitation. No evidence of mitral valve stenosis. MV peak gradient, 5.7 mmHg. The mean mitral valve gradient is 2.0 mmHg. Tricuspid Valve: The tricuspid valve is normal in structure. Tricuspid valve regurgitation is trivial. No evidence of tricuspid stenosis. Aortic Valve: The aortic valve is normal in structure. Aortic valve regurgitation is not visualized. No aortic stenosis is present. Aortic valve mean gradient measures 3.0 mmHg. Aortic valve peak gradient measures 6.2 mmHg. Aortic valve area, by VTI measures 2.27 cm. Pulmonic Valve: The pulmonic valve was normal in structure. Pulmonic valve regurgitation is not visualized. No evidence of pulmonic stenosis. Aorta: The aortic root is normal in size and structure. Venous: The inferior vena cava is normal in size with greater than 50% respiratory variability,  suggesting right atrial pressure of 3 mmHg. IAS/Shunts: No atrial level shunt detected by color flow Doppler. Agitated saline contrast was given intravenously to evaluate for intracardiac shunting. Agitated saline contrast bubble study was negative, with no evidence of any interatrial shunt.  LEFT VENTRICLE PLAX 2D LVIDd:         4.40 cm   Diastology LVIDs:         2.50 cm   LV e' medial:    7.18 cm/s LV PW:         1.00 cm   LV E/e' medial:  8.2 LV IVS:        0.90 cm   LV e' lateral:   9.25 cm/s LVOT diam:     2.00 cm   LV E/e' lateral: 6.4 LV SV:         62 LV SV Index:   31 LVOT Area:     3.14 cm  RIGHT VENTRICLE RV Basal diam:  3.10 cm RV Mid diam:    2.60 cm RV S prime:     8.49 cm/s TAPSE (M-mode): 2.3 cm LEFT ATRIUM             Index  RIGHT ATRIUM           Index LA diam:        2.50 cm 1.26 cm/m   RA Area:     14.00 cm LA Vol (A2C):   31.1 ml 15.63 ml/m  RA Volume:   33.20 ml  16.68 ml/m LA Vol (A4C):   27.4 ml 13.77 ml/m LA Biplane Vol: 30.8 ml 15.48 ml/m  AORTIC VALVE AV Area (Vmax):    2.18 cm AV Area (Vmean):   2.07 cm AV Area (VTI):     2.27 cm AV Vmax:           124.50 cm/s AV Vmean:          83.400 cm/s AV VTI:            0.272 m AV Peak Grad:      6.2 mmHg AV Mean Grad:      3.0 mmHg LVOT Vmax:         86.50 cm/s LVOT Vmean:        55.000 cm/s LVOT VTI:          0.196 m LVOT/AV VTI ratio: 0.72  AORTA Ao Root diam: 2.70 cm MITRAL VALVE               TRICUSPID VALVE MV Area (PHT): 2.66 cm    TR Peak grad:   19.0 mmHg MV Area VTI:   1.83 cm    TR Vmax:        218.00 cm/s MV Peak grad:  5.7 mmHg MV Mean grad:  2.0 mmHg    SHUNTS MV Vmax:       1.19 m/s    Systemic VTI:  0.20 m MV Vmean:      69.0 cm/s   Systemic Diam: 2.00 cm MV Decel Time: 285 msec MV E velocity: 59.20 cm/s MV A velocity: 84.90 cm/s MV E/A ratio:  0.70 Lorine Bears MD Electronically signed by Lorine Bears MD Signature Date/Time: 02/11/2023/11:03:24 AM    Final    MR BRAIN WO CONTRAST  Result Date:  02/11/2023 CLINICAL DATA:  Acute neurologic deficit EXAM: MRI HEAD WITHOUT CONTRAST TECHNIQUE: Multiplanar, multiecho pulse sequences of the brain and surrounding structures were obtained without intravenous contrast. COMPARISON:  07/28/2019 FINDINGS: Brain: No acute infarct, mass effect or extra-axial collection. No chronic microhemorrhage or siderosis. Normal white matter signal, parenchymal volume and CSF spaces. The midline structures are normal. Vascular: Normal flow voids. Skull and upper cervical spine: Normal marrow signal. Sinuses/Orbits: Negative. Other: None. IMPRESSION: Normal MRI of the brain. Electronically Signed   By: Deatra Robinson M.D.   On: 02/11/2023 00:05   CT ANGIO HEAD W OR WO CONTRAST  Result Date: 02/10/2023 CLINICAL DATA:  Blurry vision EXAM: CT ANGIOGRAPHY HEAD AND NECK CT PERFUSION BRAIN TECHNIQUE: Multidetector CT imaging of the head and neck was performed using the standard protocol during bolus administration of intravenous contrast. Multiplanar CT image reconstructions and MIPs were obtained to evaluate the vascular anatomy. Carotid stenosis measurements (when applicable) are obtained utilizing NASCET criteria, using the distal internal carotid diameter as the denominator. Multiphase CT imaging of the brain was performed following IV bolus contrast injection. Subsequent parametric perfusion maps were calculated using RAPID software. RADIATION DOSE REDUCTION: This exam was performed according to the departmental dose-optimization program which includes automated exposure control, adjustment of the mA and/or kV according to patient size and/or use of iterative reconstruction technique. CONTRAST:  OMNIPAQUE IOHEXOL 350 MG/ML SOLN COMPARISON:  None Available. FINDINGS: CTA NECK FINDINGS SKELETON: There is no bony spinal canal stenosis. No lytic or blastic lesion. OTHER NECK: Normal pharynx, larynx and major salivary glands. No cervical lymphadenopathy. Unremarkable thyroid gland.  UPPER CHEST: No pneumothorax or pleural effusion. No nodules or masses. AORTIC ARCH: There is calcific atherosclerosis of the aortic arch. There is no aneurysm, dissection or hemodynamically significant stenosis of the visualized portion of the aorta. Conventional 3 vessel aortic branching pattern. The visualized proximal subclavian arteries are widely patent. RIGHT CAROTID SYSTEM: No dissection, occlusion or aneurysm. There is mixed density atherosclerosis extending into the proximal ICA, resulting in less than 50% stenosis. LEFT CAROTID SYSTEM: No dissection, occlusion or aneurysm. There is calcified atherosclerosis extending into the proximal ICA, resulting in approximately 70% stenosis. VERTEBRAL ARTERIES: Left dominant configuration. Both origins are clearly patent. There is no dissection, occlusion or flow-limiting stenosis to the skull base (V1-V3 segments). CTA HEAD FINDINGS POSTERIOR CIRCULATION: --Vertebral arteries: Normal V4 segments. --Inferior cerebellar arteries: Normal. --Basilar artery: Normal. --Superior cerebellar arteries: Normal. --Posterior cerebral arteries (PCA): Normal. ANTERIOR CIRCULATION: --Intracranial internal carotid arteries: Normal. --Anterior cerebral arteries (ACA): Normal. Both A1 segments are present. Patent anterior communicating artery (a-comm). --Middle cerebral arteries (MCA): Normal. VENOUS SINUSES: As permitted by contrast timing, patent. ANATOMIC VARIANTS: Fetal origins of both posterior cerebral arteries. Review of the MIP images confirms the above findings. CT Brain Perfusion Findings: ASPECTS: 10 CBF (<30%) Volume: 0mL Perfusion (Tmax>6.0s) volume: 0mL Mismatch Volume: 0mL Infarction Location:None IMPRESSION: 1. No emergent large vessel occlusion or high-grade stenosis of the intracranial arteries. 2. 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 Less than 50% stenosis on the right. 3. Normal CT  perfusion. Electronically Signed   By: Deatra Robinson M.D.   On: 02/10/2023 19:38   CT ANGIO NECK W OR WO CONTRAST  Result Date: 02/10/2023 CLINICAL DATA:  Blurry vision EXAM: CT ANGIOGRAPHY HEAD AND NECK CT PERFUSION BRAIN TECHNIQUE: Multidetector CT imaging of the head and neck was performed using the standard protocol during bolus administration of intravenous contrast. Multiplanar CT image reconstructions and MIPs were obtained to evaluate the vascular anatomy. Carotid stenosis measurements (when applicable) are obtained utilizing NASCET criteria, using the distal internal carotid diameter as the denominator. Multiphase CT imaging of the brain was performed following IV bolus contrast injection. Subsequent parametric perfusion maps were calculated using RAPID software. RADIATION DOSE REDUCTION: This exam was performed according to the departmental dose-optimization program which includes automated exposure control, adjustment of the mA and/or kV according to patient size and/or use of iterative reconstruction technique. CONTRAST:  OMNIPAQUE IOHEXOL 350 MG/ML SOLN COMPARISON:  None Available. FINDINGS: CTA NECK FINDINGS SKELETON: There is no bony spinal canal stenosis. No lytic or blastic lesion. OTHER NECK: Normal pharynx, larynx and major salivary glands. No cervical lymphadenopathy. Unremarkable thyroid gland. UPPER CHEST: No pneumothorax or pleural effusion. No nodules or masses. AORTIC ARCH: There is calcific atherosclerosis of the aortic arch. There is no aneurysm, dissection or hemodynamically significant stenosis of the visualized portion of the aorta. Conventional 3 vessel aortic branching pattern. The visualized proximal subclavian arteries are widely patent. RIGHT CAROTID SYSTEM: No dissection, occlusion or aneurysm. There is mixed density atherosclerosis extending into the proximal ICA, resulting in less than 50% stenosis. LEFT CAROTID SYSTEM: No dissection, occlusion or aneurysm. There is  calcified atherosclerosis extending into the proximal ICA, resulting in approximately 70% stenosis. VERTEBRAL ARTERIES: Left dominant configuration. Both origins are clearly patent. There is no  dissection, occlusion or flow-limiting stenosis to the skull base (V1-V3 segments). CTA HEAD FINDINGS POSTERIOR CIRCULATION: --Vertebral arteries: Normal V4 segments. --Inferior cerebellar arteries: Normal. --Basilar artery: Normal. --Superior cerebellar arteries: Normal. --Posterior cerebral arteries (PCA): Normal. ANTERIOR CIRCULATION: --Intracranial internal carotid arteries: Normal. --Anterior cerebral arteries (ACA): Normal. Both A1 segments are present. Patent anterior communicating artery (a-comm). --Middle cerebral arteries (MCA): Normal. VENOUS SINUSES: As permitted by contrast timing, patent. ANATOMIC VARIANTS: Fetal origins of both posterior cerebral arteries. Review of the MIP images confirms the above findings. CT Brain Perfusion Findings: ASPECTS: 10 CBF (<30%) Volume: 0mL Perfusion (Tmax>6.0s) volume: 0mL Mismatch Volume: 0mL Infarction Location:None IMPRESSION: 1. No emergent large vessel occlusion or high-grade stenosis of the intracranial arteries. 2. 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 Less than 50% stenosis on the right. 3. Normal CT perfusion. Electronically Signed   By: Deatra Robinson M.D.   On: 02/10/2023 19:38   CT CEREBRAL PERFUSION W CONTRAST  Result Date: 02/10/2023 CLINICAL DATA:  Blurry vision EXAM: CT ANGIOGRAPHY HEAD AND NECK CT PERFUSION BRAIN TECHNIQUE: Multidetector CT imaging of the head and neck was performed using the standard protocol during bolus administration of intravenous contrast. Multiplanar CT image reconstructions and MIPs were obtained to evaluate the vascular anatomy. Carotid stenosis measurements (when applicable) are obtained utilizing NASCET criteria, using the distal internal carotid diameter as  the denominator. Multiphase CT imaging of the brain was performed following IV bolus contrast injection. Subsequent parametric perfusion maps were calculated using RAPID software. RADIATION DOSE REDUCTION: This exam was performed according to the departmental dose-optimization program which includes automated exposure control, adjustment of the mA and/or kV according to patient size and/or use of iterative reconstruction technique. CONTRAST:  OMNIPAQUE IOHEXOL 350 MG/ML SOLN COMPARISON:  None Available. FINDINGS: CTA NECK FINDINGS SKELETON: There is no bony spinal canal stenosis. No lytic or blastic lesion. OTHER NECK: Normal pharynx, larynx and major salivary glands. No cervical lymphadenopathy. Unremarkable thyroid gland. UPPER CHEST: No pneumothorax or pleural effusion. No nodules or masses. AORTIC ARCH: There is calcific atherosclerosis of the aortic arch. There is no aneurysm, dissection or hemodynamically significant stenosis of the visualized portion of the aorta. Conventional 3 vessel aortic branching pattern. The visualized proximal subclavian arteries are widely patent. RIGHT CAROTID SYSTEM: No dissection, occlusion or aneurysm. There is mixed density atherosclerosis extending into the proximal ICA, resulting in less than 50% stenosis. LEFT CAROTID SYSTEM: No dissection, occlusion or aneurysm. There is calcified atherosclerosis extending into the proximal ICA, resulting in approximately 70% stenosis. VERTEBRAL ARTERIES: Left dominant configuration. Both origins are clearly patent. There is no dissection, occlusion or flow-limiting stenosis to the skull base (V1-V3 segments). CTA HEAD FINDINGS POSTERIOR CIRCULATION: --Vertebral arteries: Normal V4 segments. --Inferior cerebellar arteries: Normal. --Basilar artery: Normal. --Superior cerebellar arteries: Normal. --Posterior cerebral arteries (PCA): Normal. ANTERIOR CIRCULATION: --Intracranial internal carotid arteries: Normal. --Anterior cerebral  arteries (ACA): Normal. Both A1 segments are present. Patent anterior communicating artery (a-comm). --Middle cerebral arteries (MCA): Normal. VENOUS SINUSES: As permitted by contrast timing, patent. ANATOMIC VARIANTS: Fetal origins of both posterior cerebral arteries. Review of the MIP images confirms the above findings. CT Brain Perfusion Findings: ASPECTS: 10 CBF (<30%) Volume: 0mL Perfusion (Tmax>6.0s) volume: 0mL Mismatch Volume: 0mL Infarction Location:None IMPRESSION: 1. No emergent large vessel occlusion or high-grade stenosis of the intracranial arteries. 2. 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 Less than 50% stenosis on the right. 3. Normal  CT perfusion. Electronically Signed   By: Deatra Robinson M.D.   On: 02/10/2023 19:38   CT HEAD CODE STROKE WO CONTRAST  Result Date: 02/10/2023 CLINICAL DATA:  Code stroke. Neuro deficit, acute, stroke suspected. EXAM: CT HEAD WITHOUT CONTRAST TECHNIQUE: Contiguous axial images were obtained from the base of the skull through the vertex without intravenous contrast. RADIATION DOSE REDUCTION: This exam was performed according to the departmental dose-optimization program which includes automated exposure control, adjustment of the mA and/or kV according to patient size and/or use of iterative reconstruction technique. COMPARISON:  Head CT 05/18/2022. FINDINGS: Brain: No acute hemorrhage, mass effect or midline shift. Gray-white differentiation is preserved. No hydrocephalus. No extra-axial collection. Basilar cisterns are patent. Vascular: No hyperdense vessel or unexpected calcification. Skull: No calvarial fracture or suspicious bone lesion. Skull base is unremarkable. Sinuses/Orbits: Unremarkable. Other: None. ASPECTS (Alberta Stroke Program Early CT Score) - Ganglionic level infarction (caudate, lentiform nuclei, internal capsule, insula, M1-M3 cortex): 7 - Supraganglionic infarction (M4-M6  cortex): 3 Total score (0-10 with 10 being normal): 10 IMPRESSION: No acute intracranial hemorrhage or evidence of evolving large vessel territory infarct. ASPECT score is 10. Code stroke imaging results were communicated on 02/10/2023 at 6:18 pm to provider Dr. Sidney Ace via telephone, who verbally acknowledged these results. Electronically Signed   By: Orvan Falconer M.D.   On: 02/10/2023 18:18   DG Lumbar Spine 2-3 Views  Result Date: 02/04/2023 CLINICAL DATA:  Fall, back pain EXAM: LUMBAR SPINE - 2-3 VIEW COMPARISON:  01/25/2023 FINDINGS: Normal lumbar lordosis. No acute fracture or listhesis of the lumbar spine. Vertebral body height is preserved. Minimal disc space narrowing at L3-4 and L4-5 is in keeping with minimal degenerative disc disease at these levels, unchanged. Paraspinal soft tissues are unremarkable. IMPRESSION: 1. No acute fracture or listhesis. Minimal degenerative disc disease. Electronically Signed   By: Helyn Numbers M.D.   On: 02/04/2023 23:36   MR LUMBAR SPINE WO CONTRAST  Result Date: 02/04/2023 CLINICAL DATA:  Lumbar radiculopathy. Lower back pain with bilateral leg pain and weakness times 2-3 weeks. EXAM: MRI LUMBAR SPINE WITHOUT CONTRAST TECHNIQUE: Multiplanar, multisequence MR imaging of the lumbar spine was performed. No intravenous contrast was administered. COMPARISON:  Lumbar spine MRI 01/11/2017. FINDINGS: Segmentation: Conventional numbering is assumed with 5 non-rib-bearing, lumbar type vertebral bodies. Alignment:  Normal. Vertebrae: Normal vertebral body heights. Mild Modic type 1 degenerative endplate marrow signal changes at L4-5. Conus medullaris and cauda equina: Conus extends to the L1 level. Conus and cauda equina appear normal. Paraspinal and other soft tissues: Unremarkable. Disc levels: T12-L1:  Normal. L1-L2:  Normal. L2-L3:  Normal. L3-L4: Small disc bulge and mild bilateral facet arthropathy. No spinal canal stenosis or neural foraminal narrowing. L4-L5: Disc  bulge with superimposed right subarticular disc extrusion compresses the traversing right L5 nerve root in the lateral recess. Mild bilateral facet arthropathy without significant neural foraminal narrowing. L5-S1: Right eccentric disc bulge and right-sided facet arthropathy results in mild right neural foraminal narrowing. No spinal canal stenosis. IMPRESSION: At L4-L5, a right subarticular disc extrusion compresses the traversing right L5 nerve root in the lateral recess. Electronically Signed   By: Orvan Falconer M.D.   On: 02/04/2023 14:24    Subjective: Patient was seen and examined at bed side, She reports feeling much better, denies any chest pain, SOB, cleared from cardiology for discharge.   Discharge Exam: Vitals:   03/01/23 0827 03/01/23 1225  BP: (!) 144/76 114/73  Pulse: 76 76  Resp: 18  19  Temp: 98.3 F (36.8 C) 97.8 F (36.6 C)  SpO2: 100% 100%   Vitals:   03/01/23 0406 03/01/23 0740 03/01/23 0827 03/01/23 1225  BP: 110/68 138/80 (!) 144/76 114/73  Pulse: 73 73 76 76  Resp: 18 19 18 19   Temp: 98 F (36.7 C) 98.2 F (36.8 C) 98.3 F (36.8 C) 97.8 F (36.6 C)  TempSrc:  Oral    SpO2: 100% 100% 100% 100%  Weight:      Height:        General: Pt is alert, awake, not in acute distress Cardiovascular: RRR, S1/S2 +, no rubs, no gallops Respiratory: CTA bilaterally, no wheezing, no rhonchi Abdominal: Soft, NT, ND, bowel sounds + Extremities: no edema, no cyanosis    The results of significant diagnostics from this hospitalization (including imaging, microbiology, ancillary and laboratory) are listed below for reference.     Microbiology: No results found for this or any previous visit (from the past 240 hour(s)).   Labs: BNP (last 3 results) Recent Labs    02/28/23 0031  BNP 30.9   Basic Metabolic Panel: Recent Labs  Lab 02/27/23 1824  NA 133*  K 3.2*  CL 100  CO2 21*  GLUCOSE 114*  BUN 12  CREATININE 0.75  CALCIUM 9.4   Liver Function  Tests: No results for input(s): "AST", "ALT", "ALKPHOS", "BILITOT", "PROT", "ALBUMIN" in the last 168 hours. No results for input(s): "LIPASE", "AMYLASE" in the last 168 hours. No results for input(s): "AMMONIA" in the last 168 hours. CBC: Recent Labs  Lab 02/27/23 1824 03/01/23 0457  WBC 8.8 8.9  HGB 10.4* 9.8*  HCT 32.1* 30.9*  MCV 94.4 93.9  PLT 342 289   Cardiac Enzymes: No results for input(s): "CKTOTAL", "CKMB", "CKMBINDEX", "TROPONINI" in the last 168 hours. BNP: Invalid input(s): "POCBNP" CBG: Recent Labs  Lab 02/28/23 1938 02/28/23 2342 03/01/23 0405 03/01/23 0829 03/01/23 1150  GLUCAP 108* 212* 115* 136* 131*   D-Dimer No results for input(s): "DDIMER" in the last 72 hours. Hgb A1c No results for input(s): "HGBA1C" in the last 72 hours. Lipid Profile No results for input(s): "CHOL", "HDL", "LDLCALC", "TRIG", "CHOLHDL", "LDLDIRECT" in the last 72 hours. Thyroid function studies No results for input(s): "TSH", "T4TOTAL", "T3FREE", "THYROIDAB" in the last 72 hours.  Invalid input(s): "FREET3" Anemia work up No results for input(s): "VITAMINB12", "FOLATE", "FERRITIN", "TIBC", "IRON", "RETICCTPCT" in the last 72 hours. Urinalysis    Component Value Date/Time   COLORURINE STRAW (A) 02/10/2023 1834   APPEARANCEUR CLEAR (A) 02/10/2023 1834   APPEARANCEUR Clear 02/19/2015 1647   LABSPEC 1.003 (L) 02/10/2023 1834   LABSPEC 1.025 02/19/2015 1647   PHURINE 6.0 02/10/2023 1834   GLUCOSEU NEGATIVE 02/10/2023 1834   GLUCOSEU Negative 02/19/2015 1647   HGBUR NEGATIVE 02/10/2023 1834   BILIRUBINUR NEGATIVE 02/10/2023 1834   BILIRUBINUR Negative 02/19/2015 1647   KETONESUR NEGATIVE 02/10/2023 1834   PROTEINUR NEGATIVE 02/10/2023 1834   NITRITE NEGATIVE 02/10/2023 1834   LEUKOCYTESUR NEGATIVE 02/10/2023 1834   LEUKOCYTESUR Trace 02/19/2015 1647   Sepsis Labs Recent Labs  Lab 02/27/23 1824 03/01/23 0457  WBC 8.8 8.9   Microbiology No results found for this  or any previous visit (from the past 240 hour(s)).   Time coordinating discharge: Over 30 minutes  SIGNED:   Willeen Niece, MD  [facility] 03/01/2023, 3:06 PM Pager   If 7PM-7AM, please contact night-coverage

## 2023-03-03 ENCOUNTER — Telehealth: Payer: Self-pay | Admitting: Cardiovascular Disease

## 2023-03-03 NOTE — Telephone Encounter (Signed)
Returned call to patient and reviewed discharge instructions.  Patient is scheduled for hospital F/U with Nicolasa Ducking, NP on 03/21/23. Patient is asking if she needs to see Dr. Chilton Si in 3 weeks (she was seen by Dr. Duke Salvia while in the hospital).  No openings with Dr. Duke Salvia available to schedule. Advised patient to discuss further F/U at next appt on 05/03.  Patient verbalized understanding and expressed appreciation for call.

## 2023-03-03 NOTE — Telephone Encounter (Signed)
Pt called in to clarify her discharge instructions. She states she was told to f/u with Dr. Duke Salvia specialty clinic, not sure if she is supposed to f/u with [facility] or if they just meant f/u with gen card. She is sch to see Brion Aliment, NP 03/21/23. Please advise.

## 2023-03-04 LAB — LIPOPROTEIN A (LPA): Lipoprotein (a): 420.6 nmol/L — ABNORMAL HIGH (ref <75.0)

## 2023-03-05 ENCOUNTER — Telehealth: Payer: Self-pay

## 2023-03-05 NOTE — Telephone Encounter (Signed)
Destiny Savage is calling asking if we can fax last OV notes to them, fax number is [phone].

## 2023-03-06 ENCOUNTER — Telehealth: Payer: Self-pay | Admitting: Cardiovascular Disease

## 2023-03-06 NOTE — Telephone Encounter (Signed)
Called pt in regards to pain at right femoral stent placement site.  Reports pain started about 2 days ago and is getting worse every day.  Placed tissue over the area and noted brown discharge on tissue.   Reports area is red feels like a knot under staples.  Staples still in place.  Denies fever, took X2 ES Tylenol today around 9 am.  Rates pain 10:10.  Reports a little pain to right leg lower than femoral site.  Advised pt will route to MD but will also recommend an ED visit to assess site.

## 2023-03-06 NOTE — Telephone Encounter (Signed)
Advised Destiny Savage please have patient fill out a Records request and we will fax over last ov notes.

## 2023-03-06 NOTE — Telephone Encounter (Signed)
Pt states she is having some pain where they placed the stables from her stent. She states the pain is in her right leg and its painful to lay down and sit down. Please advise.

## 2023-03-07 ENCOUNTER — Emergency Department
Admission: EM | Admit: 2023-03-07 | Discharge: 2023-03-07 | Disposition: A | Discharge status: Home / Self Care | Payer: Medicare Other | Attending: Emergency Medicine | Admitting: Emergency Medicine

## 2023-03-07 ENCOUNTER — Other Ambulatory Visit: Payer: Self-pay

## 2023-03-07 ENCOUNTER — Emergency Department: Payer: Medicare Other

## 2023-03-07 ENCOUNTER — Ambulatory Visit
Admission: RE | Admit: 2023-03-07 | Discharge: 2023-03-07 | Disposition: A | Discharge status: Home / Self Care | Payer: Medicare Other | Source: Ambulatory Visit | Attending: Internal Medicine | Admitting: Internal Medicine

## 2023-03-07 DIAGNOSIS — L02415 Cutaneous abscess of right lower limb: Secondary | ICD-10-CM

## 2023-03-07 DIAGNOSIS — I10 Essential (primary) hypertension: Secondary | ICD-10-CM | POA: Diagnosis not present

## 2023-03-07 DIAGNOSIS — Z1231 Encounter for screening mammogram for malignant neoplasm of breast: Secondary | ICD-10-CM | POA: Insufficient documentation

## 2023-03-07 DIAGNOSIS — R000 Tachycardia, unspecified: Secondary | ICD-10-CM | POA: Diagnosis not present

## 2023-03-07 DIAGNOSIS — E119 Type 2 diabetes mellitus without complications: Secondary | ICD-10-CM | POA: Insufficient documentation

## 2023-03-07 DIAGNOSIS — T8149XA Infection following a procedure, other surgical site, initial encounter: Secondary | ICD-10-CM | POA: Diagnosis present

## 2023-03-07 DIAGNOSIS — R1031 Right lower quadrant pain: Secondary | ICD-10-CM | POA: Diagnosis not present

## 2023-03-07 LAB — CBC WITH DIFFERENTIAL/PLATELET
Abs Immature Granulocytes: 0.04 10*3/uL (ref 0.00–0.07)
Basophils Absolute: 0 10*3/uL (ref 0.0–0.1)
Basophils Relative: 0 %
Eosinophils Absolute: 0.5 10*3/uL (ref 0.0–0.5)
Eosinophils Relative: 6 %
HCT: 33.9 % — ABNORMAL LOW (ref 36.0–46.0)
Hemoglobin: 10.7 g/dL — ABNORMAL LOW (ref 12.0–15.0)
Immature Granulocytes: 0 %
Lymphocytes Relative: 28 %
Lymphs Abs: 2.5 10*3/uL (ref 0.7–4.0)
MCH: 30.3 pg (ref 26.0–34.0)
MCHC: 31.6 g/dL (ref 30.0–36.0)
MCV: 96 fL (ref 80.0–100.0)
Monocytes Absolute: 0.6 10*3/uL (ref 0.1–1.0)
Monocytes Relative: 6 %
Neutro Abs: 5.4 10*3/uL (ref 1.7–7.7)
Neutrophils Relative %: 60 %
Platelets: 275 10*3/uL (ref 150–400)
RBC: 3.53 MIL/uL — ABNORMAL LOW (ref 3.87–5.11)
RDW: 13.8 % (ref 11.5–15.5)
WBC: 9.1 10*3/uL (ref 4.0–10.5)
nRBC: 0 % (ref 0.0–0.2)

## 2023-03-07 LAB — LACTIC ACID, PLASMA: Lactic Acid, Venous: 1.4 mmol/L (ref 0.5–1.9)

## 2023-03-07 LAB — COMPREHENSIVE METABOLIC PANEL
ALT: 11 U/L (ref 0–44)
AST: 18 U/L (ref 15–41)
Albumin: 3.9 g/dL (ref 3.5–5.0)
Alkaline Phosphatase: 64 U/L (ref 38–126)
Anion gap: 9 (ref 5–15)
BUN: 12 mg/dL (ref 8–23)
CO2: 25 mmol/L (ref 22–32)
Calcium: 9.6 mg/dL (ref 8.9–10.3)
Chloride: 102 mmol/L (ref 98–111)
Creatinine, Ser: 0.78 mg/dL (ref 0.44–1.00)
GFR, Estimated: >60 mL/min (ref >60)
Glucose, Bld: 101 mg/dL — ABNORMAL HIGH (ref 70–99)
Potassium: 4.2 mmol/L (ref 3.5–5.1)
Sodium: 136 mmol/L (ref 135–145)
Total Bilirubin: 0.6 mg/dL (ref 0.3–1.2)
Total Protein: 7.1 g/dL (ref 6.5–8.1)

## 2023-03-07 MED ORDER — CEPHALEXIN 500 MG PO CAPS
500.0000 mg | ORAL_CAPSULE | Freq: Once | ORAL | Status: AC
  Administered 2023-03-07: 500 mg via ORAL

## 2023-03-07 MED ORDER — TETRACAINE HCL 1 % IJ SOLN: 10.0000 mg | Freq: Once | INTRAMUSCULAR | Status: DC

## 2023-03-07 MED ORDER — DIPHENHYDRAMINE HCL 50 MG/ML IJ SOLN
25.0000 mg | Freq: Once | INTRAMUSCULAR | Status: AC
  Administered 2023-03-07: 25 mg via INTRAMUSCULAR
  Filled 2023-03-07: qty 1

## 2023-03-07 MED ORDER — CEPHALEXIN 500 MG PO CAPS: 500.0000 mg | ORAL_CAPSULE | Freq: Four times a day (QID) | ORAL | 0 refills | Status: AC

## 2023-03-07 NOTE — ED Provider Notes (Signed)
[facility] Provider Note    Event Date/Time   First MD Initiated Contact with Patient 03/07/23 1839     (approximate)   History   Post-op Problem   HPI  Destiny Savage is a 72 y.o. female   Past medical history of Diabetes, hypertension, hyperlipidemia, stroke, and a carotid stent placement at the end of March 2024 who presents to the emergency department with pain and purulent drainage at the right groin access site from her procedure at the end of March.  She had been healing nicely in the weeks after her procedure but then about 3 days ago she developed pain and noticed some purulence from the site.  She still has staples there.  She denies any systemic symptoms like fevers, chills, chest pain, and has no other acute medical complaints.   Independent Historian contributed to assessment above: Husband who is at bedside  External Medical Documents Reviewed: Procedure note from carotid stent placement Dr. Levora Dredge dated 02/12/2023      Physical Exam   Triage Vital Signs: ED Triage Vitals [03/07/23 1716]  Enc Vitals Group     BP 131/76     Pulse Rate 76     Resp 18     Temp 98 F (36.7 C)     Temp Source Oral     SpO2 100 %     Weight      Height      Head Circumference      Peak Flow      Pain Score 9     Pain Loc      Pain Edu?      Excl. in GC?     Most recent vital signs: Vitals:   03/07/23 1908 03/07/23 2138  BP:    Pulse: (!) 116   Resp:    Temp:    SpO2: 100% 99%    General: Awake, no distress.  CV:  Good peripheral perfusion.  Resp:  Normal effort.  Abd:  No distention.  Other:  Comfortable nontoxic appearance with mild tachycardia afebrile and a right groin access site with staples still intact and some firm indurated site underlying the staples with some scant purulent drainage surrounding.  No overlying skin changes.  The right lower extremity is otherwise neurovascular intact with strong pedal pulses.   ED  Results / Procedures / Treatments   Labs (all labs ordered are listed, but only abnormal results are displayed) Labs Reviewed  COMPREHENSIVE METABOLIC PANEL - Abnormal; Notable for the following components:      Result Value   Glucose, Bld 101 (*)    All other components within normal limits  CBC WITH DIFFERENTIAL/PLATELET - Abnormal; Notable for the following components:   RBC 3.53 (*)    Hemoglobin 10.7 (*)    HCT 33.9 (*)    All other components within normal limits  LACTIC ACID, PLASMA     I ordered and reviewed the above labs they are notable for white blood cell count is normal as is her initial lactic    RADIOLOGY I independently reviewed and interpreted ultrasound of the right lower extremity and see a small hypoechoic area subcutaneously suspicious for abscess or cobblestoning/cellulitis   PROCEDURES:  Critical Care performed: No  ..Incision and Drainage  Date/Time: 03/07/2023 10:59 PM  Performed by: Pilar Jarvis, MD Authorized by: Pilar Jarvis, MD   Consent:    Consent obtained:  Verbal   Consent given by:  Patient  Risks, benefits, and alternatives were discussed: yes     Risks discussed:  Bleeding, incomplete drainage, infection and damage to other organs   Alternatives discussed:  No treatment and delayed treatment Universal protocol:    Procedure explained and questions answered to patient or proxy's satisfaction: yes     Patient identity confirmed:  Verbally with patient Location:    Type:  Abscess   Size:  2cm   Location:  Lower extremity   Lower extremity location:  Leg   Leg location:  R upper leg Pre-procedure details:    Skin preparation:  Antiseptic wash Sedation:    Sedation type:  None Anesthesia:    Anesthesia method:  Local infiltration   Local anesthetic:  Diphenhydramine 1% Procedure type:    Complexity:  Complex Procedure details:    Ultrasound guidance: no     Needle aspiration: no     Incision types:  Stab incision   Incision  depth:  Dermal   Wound management:  Probed and deloculated   Drainage:  Purulent and bloody   Drainage amount:  Moderate   Wound treatment:  Wound left open   Packing materials:  None Post-procedure details:    Procedure completion:  Tolerated    MEDICATIONS ORDERED IN ED: Medications  cephALEXin (KEFLEX) capsule 500 mg (has no administration in time range)  diphenhydrAMINE (BENADRYL) injection 25 mg (25 mg Intramuscular Given by Other 03/07/23 2223)    External physician / consultants:  I spoke with vascular surgeon consultant regarding care plan for this patient.   IMPRESSION / MDM / ASSESSMENT AND PLAN / ED COURSE  I reviewed the triage vital signs and the nursing notes.                                Patient's presentation is most consistent with acute presentation with potential threat to life or bodily function.  Differential diagnosis includes, but is not limited to, abscess, cellulitis, pseudoaneurysm, sepsis   The patient is on the cardiac monitor to evaluate for evidence of arrhythmia and/or significant heart rate changes.  MDM: This is a patient with vascular access site from 3 weeks ago with signs of infection with purulent drainage and firm indurated site will obtain ultrasound to assess for abscess, cellulitis, or pseudoaneurysm.  Does not appear septic with normal labs afebrile normal white blood cell count and lactic.  Consult with vascular surgery after imaging is performed.         FINAL CLINICAL IMPRESSION(S) / ED DIAGNOSES   Final diagnoses:  Right inguinal pain  Abscess of right thigh     Rx / DC Orders   ED Discharge Orders          Ordered    cephALEXin (KEFLEX) 500 MG capsule  4 times daily        03/07/23 2259             Note:  This document was prepared using Dragon voice recognition software and may include unintentional dictation errors.    Pilar Jarvis, MD 03/07/23 2300

## 2023-03-07 NOTE — Telephone Encounter (Signed)
Left voicemail for patient to return call to office. 

## 2023-03-07 NOTE — Telephone Encounter (Addendum)
Patient state she will wait to see how she feels later and will go to ED if needed. She states the femoral site is uncomfortable at times. She will have her husband take her later when he gets back home.

## 2023-03-07 NOTE — Telephone Encounter (Signed)
Patient called back and stated the site has discharge and its uncomfortable from the staples. She states its 3 or 4 staples at her site. She state the drainage has an odor. I advised patient to go to the ED. She stated she will go to the ED

## 2023-03-07 NOTE — Discharge Instructions (Addendum)
Take Keflex antibiotics for the full 5 days as prescribed.  Call Dr. Gilda Crease vascular surgery on Monday to schedule follow-up appointment to recheck on the wound.

## 2023-03-07 NOTE — ED Triage Notes (Signed)
Pt to ED via POV from home. Pt reports had a heart cath done on 4/11 and went through right groin. Pt reports pain and irritation at site x3 days. Staples intact but purulent drainage noted at site.  Pt denies CP or SOB.

## 2023-03-10 ENCOUNTER — Emergency Department: Payer: Medicare Other

## 2023-03-10 ENCOUNTER — Emergency Department
Admission: EM | Admit: 2023-03-10 | Discharge: 2023-03-10 | Disposition: A | Discharge status: Home / Self Care | Payer: Medicare Other | Attending: Emergency Medicine | Admitting: Emergency Medicine

## 2023-03-10 ENCOUNTER — Encounter: Payer: Self-pay | Admitting: Emergency Medicine

## 2023-03-10 ENCOUNTER — Other Ambulatory Visit: Payer: Self-pay

## 2023-03-10 DIAGNOSIS — Z888 Allergy status to other drugs, medicaments and biological substances status: Secondary | ICD-10-CM

## 2023-03-10 DIAGNOSIS — I10 Essential (primary) hypertension: Secondary | ICD-10-CM | POA: Insufficient documentation

## 2023-03-10 DIAGNOSIS — R0602 Shortness of breath: Secondary | ICD-10-CM | POA: Insufficient documentation

## 2023-03-10 DIAGNOSIS — T464X5A Adverse effect of angiotensin-converting-enzyme inhibitors, initial encounter: Secondary | ICD-10-CM | POA: Insufficient documentation

## 2023-03-10 DIAGNOSIS — E119 Type 2 diabetes mellitus without complications: Secondary | ICD-10-CM | POA: Diagnosis not present

## 2023-03-10 LAB — CBC
HCT: 33.9 % — ABNORMAL LOW (ref 36.0–46.0)
Hemoglobin: 10.8 g/dL — ABNORMAL LOW (ref 12.0–15.0)
MCH: 30.1 pg (ref 26.0–34.0)
MCHC: 31.9 g/dL (ref 30.0–36.0)
MCV: 94.4 fL (ref 80.0–100.0)
Platelets: 270 10*3/uL (ref 150–400)
RBC: 3.59 MIL/uL — ABNORMAL LOW (ref 3.87–5.11)
RDW: 13.5 % (ref 11.5–15.5)
WBC: 7.3 10*3/uL (ref 4.0–10.5)
nRBC: 0 % (ref 0.0–0.2)

## 2023-03-10 LAB — BASIC METABOLIC PANEL
Anion gap: 13 (ref 5–15)
BUN: 9 mg/dL (ref 8–23)
CO2: 19 mmol/L — ABNORMAL LOW (ref 22–32)
Calcium: 10.1 mg/dL (ref 8.9–10.3)
Chloride: 106 mmol/L (ref 98–111)
Creatinine, Ser: 0.84 mg/dL (ref 0.44–1.00)
GFR, Estimated: >60 mL/min (ref >60)
Glucose, Bld: 172 mg/dL — ABNORMAL HIGH (ref 70–99)
Potassium: 3.6 mmol/L (ref 3.5–5.1)
Sodium: 138 mmol/L (ref 135–145)

## 2023-03-10 MED ORDER — DIPHENHYDRAMINE HCL 25 MG PO CAPS
50.0000 mg | ORAL_CAPSULE | Freq: Once | ORAL | Status: AC
  Administered 2023-03-10: 50 mg via ORAL
  Filled 2023-03-10: qty 2

## 2023-03-10 NOTE — ED Provider Notes (Signed)
[facility] Provider Note    Event Date/Time   First MD Initiated Contact with Patient 03/10/23 1520     (approximate)   History   Chief Complaint: Shortness of Breath   HPI  Destiny Savage is a 72 y.o. female with a history of diabetes, hypertension, GERD, hyperlipidemia who comes to the ED complaining of shortness of breath which started yesterday.  She was recently started on valsartan (first dose yesterday) and Keflex (first dose 3 days ago) for a right groin abscess that developed after cardiac catheterization procedure.  She reports yesterday after taking her first dose of valsartan, she started to feel a little bit short of breath which gradually resolved throughout the day.  This morning after taking her second dose of valsartan around 9:00 AM, she had even more intense shortness of breath along with some muffling of her voice.  Currently states that she feels fine.  No difficulty swallowing.  She has been compliant with the Keflex 4 times daily over the past few days.  No pain or other acute complaints.  No fever  She reports taking lisinopril in the past and not having had any adverse reaction to it.     Physical Exam   Triage Vital Signs: ED Triage Vitals  Enc Vitals Group     BP 03/10/23 1438 (!) 148/75     Pulse Rate 03/10/23 1438 73     Resp 03/10/23 1438 18     Temp 03/10/23 1438 97.9 F (36.6 C)     Temp Source 03/10/23 1438 Oral     SpO2 03/10/23 1438 100 %     Weight --      Height --      Head Circumference --      Peak Flow --      Pain Score 03/10/23 1439 0     Pain Loc --      Pain Edu? --      Excl. in GC? --     Most recent vital signs: Vitals:   03/10/23 1438  BP: (!) 148/75  Pulse: 73  Resp: 18  Temp: 97.9 F (36.6 C)  SpO2: 100%    General: Awake, no distress.  CV:  Good peripheral perfusion.  Regular rate and rhythm.  Normal distal pulses. Resp:  Normal effort.  Clear to auscultation bilaterally, without  wheezing or stridor Abd:  No distention.  Other:  Moist oral mucosa.  No tongue or floor of mouth swelling.  No tongue elevation, no uvula edema.  Neck is soft and nontender, no thyromegaly or palpable mass.  Right groin access site inspected, no erythema tenderness warmth swelling or drainage.  Clean dressing in place.   ED Results / Procedures / Treatments   Labs (all labs ordered are listed, but only abnormal results are displayed) Labs Reviewed  BASIC METABOLIC PANEL - Abnormal; Notable for the following components:      Result Value   CO2 19 (*)    Glucose, Bld 172 (*)    All other components within normal limits  CBC - Abnormal; Notable for the following components:   RBC 3.59 (*)    Hemoglobin 10.8 (*)    HCT 33.9 (*)    All other components within normal limits     EKG Interpreted by me Normal sinus rhythm rate of 75.  Normal axis intervals QRS ST segments and T waves   RADIOLOGY Chest x-ray interpreted by me, appears normal.  Radiology report  reviewed   PROCEDURES:  Procedures   MEDICATIONS ORDERED IN ED: Medications  diphenhydrAMINE (BENADRYL) capsule 50 mg (has no administration in time range)     IMPRESSION / MDM / ASSESSMENT AND PLAN / ED COURSE  I reviewed the triage vital signs and the nursing notes.  DDx: Valsartan allergy, anemia, electrolyte abnormality, AKI, pleural effusion, less likely Keflex allergy  Patient's presentation is most consistent with acute presentation with potential threat to life or bodily function.  Patient presents with shortness of breath since yesterday, temporally related to her first 2 doses of valsartan concerning for a mild angioedema reaction.  No urticaria, no anaphylaxis.  Vital signs are normal she is nontoxic and comfortable.  Currently, 6 to 7 hours after last dose of valsartan, she is doing well without any voice muffling or other signs of airway compromise.  Counseled the patient extensively about ace/ARB  related angioedema and need to avoid these medications indefinitely.  She will stop taking the valsartan and follow-up with her doctor to reassess blood pressure regimen.  Although she also started Taking Keflex around this time, I think it is unlikely that this reaction is due to the Keflex due to the 4 times daily dosing not matching up with her symptom progression pattern and the nature of the symptoms being more consistent with angioedema then with a more typical allergic reaction.  She will monitor for any worsening of symptoms after continuing Keflex and stop that medicine if she notices any change in condition.  The right groin infection looks much improved today, no frank inflammatory changes.       FINAL CLINICAL IMPRESSION(S) / ED DIAGNOSES   Final diagnoses:  Allergy to angiotensin receptor blockers (ARB)     Rx / DC Orders   ED Discharge Orders     None        Note:  This document was prepared using Dragon voice recognition software and may include unintentional dictation errors.   Sharman Cheek, MD 03/10/23 [phone]

## 2023-03-10 NOTE — Discharge Instructions (Signed)
Your shortness of breath symptoms are likely due to an allergy to Valsartan. This means you should not take this medication every again.  You should also avoid any related medications, whose names end in "artan" or "pril".  Please follow up with your doctor to determine if another blood pressure medication would be appropriate.

## 2023-03-10 NOTE — ED Triage Notes (Signed)
Patient to ED via POV for SOB. Pt states the SOB started yesterday after taking her first dose of valsartan and worse after taking the 2nd dose today. Patient speaking softly/whispering in triage.

## 2023-03-21 ENCOUNTER — Ambulatory Visit: Payer: Medicare Other | Attending: Nurse Practitioner | Admitting: Nurse Practitioner

## 2023-03-21 ENCOUNTER — Encounter: Payer: Self-pay | Admitting: Nurse Practitioner

## 2023-03-21 VITALS — BP 130/70 | HR 74 | Ht 66.0 in | Wt 197.1 lb

## 2023-03-21 DIAGNOSIS — R072 Precordial pain: Secondary | ICD-10-CM

## 2023-03-21 DIAGNOSIS — I2489 Other forms of acute ischemic heart disease: Secondary | ICD-10-CM

## 2023-03-21 DIAGNOSIS — G459 Transient cerebral ischemic attack, unspecified: Secondary | ICD-10-CM

## 2023-03-21 DIAGNOSIS — I10 Essential (primary) hypertension: Secondary | ICD-10-CM | POA: Diagnosis not present

## 2023-03-21 DIAGNOSIS — E118 Type 2 diabetes mellitus with unspecified complications: Secondary | ICD-10-CM | POA: Diagnosis not present

## 2023-03-21 DIAGNOSIS — E782 Mixed hyperlipidemia: Secondary | ICD-10-CM | POA: Diagnosis not present

## 2023-03-21 DIAGNOSIS — I6522 Occlusion and stenosis of left carotid artery: Secondary | ICD-10-CM

## 2023-03-21 MED ORDER — METOPROLOL TARTRATE 50 MG PO TABS: ORAL_TABLET | ORAL | 0 refills | Status: DC

## 2023-03-21 NOTE — Patient Instructions (Addendum)
Medication Instructions:  No changes *If you need a refill on your cardiac medications before your next appointment, please call your pharmacy*   Lab Work: None ordered If you have labs (blood work) drawn today and your tests are completely normal, you will receive your results only by: MyChart Message (if you have MyChart) OR A paper copy in the mail If you have any lab test that is abnormal or we need to change your treatment, we will call you to review the results.   Follow-Up: At [facility] you and your health needs are our priority.  As part of our continuing mission to provide you with exceptional heart care, we have created designated Provider Care Teams.  These Care Teams include your primary Cardiologist (physician) and Advanced Practice Providers (APPs -  Physician Assistants and Nurse Practitioners) who all work together to provide you with the care you need, when you need it.  We recommend signing up for the patient portal called "MyChart".  Sign up information is provided on this After Visit Summary.  MyChart is used to connect with patients for Virtual Visits (Telemedicine).  Patients are able to view lab/test results, encounter notes, upcoming appointments, etc.  Non-urgent messages can be sent to your provider as well.   To learn more about what you can do with MyChart, go to [email or web address].    Your next appointment:   1 month(s)  Provider:   You may see Julien Nordmann, MD or one of the following Advanced Practice Providers on your designated Care Team:   Nicolasa Ducking, NP   Other Instructions   Your cardiac CT will be scheduled at one of the below locations:   [facility] [address] [place], [place] [address] [phone]  OR  [facility] [address] [place], [place] [address] [phone]  OR   [facility] [address] [place],  [place] [address] [phone]  If scheduled at [facility], please arrive at the [facility] and Children's Entrance (Entrance C2) of [facility] 30 minutes prior to test start time. You can use the FREE valet parking offered at entrance C (encouraged to control the heart rate for the test)  Proceed to the [facility] Radiology Department (first floor) to check-in and test prep.  All radiology patients and guests should use entrance C2 at [facility], accessed from [facility], even though the hospital's physical address listed is [address]    If scheduled at [facility] or [facility], please arrive 15 mins early for check-in and test prep.   Please follow these instructions carefully (unless otherwise directed):   On the Night Before the Test: Be sure to Drink plenty of water. Do not consume any caffeinated/decaffeinated beverages or chocolate 12 hours prior to your test. Do not take any antihistamines 12 hours prior to your test.  On the Day of the Test: Drink plenty of water until 1 hour prior to the test. Do not eat any food 1 hour prior to test. You may take your regular medications prior to the test.  Take metoprolol (Lopressor) two hours prior to test. If you take Furosemide/Hydrochlorothiazide/Spironolactone, please HOLD on the morning of the test. FEMALES- please wear underwire-free bra if available, avoid dresses & tight clothing      After the Test: Drink plenty of water. After receiving IV contrast, you may experience a mild flushed  feeling. This is normal. On occasion, you may experience a mild rash up to 24 hours after the test. This is not dangerous. If this occurs, you can take Benadryl 25 mg and increase your fluid intake. If you experience trouble breathing, this can be serious. If it is severe call 911 IMMEDIATELY. If it is mild, please call our office. If you take any of these  medications: Glipizide/Metformin, Avandament, Glucavance, please do not take 48 hours after completing test unless otherwise instructed.  We will call to schedule your test 2-4 weeks out understanding that some insurance companies will need an authorization prior to the service being performed.   For non-scheduling related questions, please contact the cardiac imaging nurse navigator should you have any questions/concerns: Rockwell Alexandria, Cardiac Imaging Nurse Navigator Larey Brick, Cardiac Imaging Nurse Navigator [facility] Direct Office Dial: [phone]   For scheduling needs, including cancellations and rescheduling, please call [place], [phone].

## 2023-03-21 NOTE — Progress Notes (Signed)
Office Visit    Patient Name: Destiny Savage Date of Encounter: 03/21/2023  Primary Care Provider:  Jaclyn Shaggy, MD Primary Cardiologist:  Julien Nordmann, MD  Chief Complaint    72 year old female with history of TIA/stroke, type 2 diabetes mellitus, carotid stenosis status post left carotid stenting, hypertension, hyperlipidemia, chronic nausea and vomiting, chronic constipation, diarrhea, obstructive sleep apnea (not on CPAP), and diastolic dysfunction, who presents for follow-up after recent hospitalization dyspnea, near syncope, and mild troponin elevation.  Past Medical History    Past Medical History:  Diagnosis Date   Arthritis    Asthma    Carotid stenosis    a. 01/2023 s/p L carotid stenting (9x7 Exact stent).   Chest pain    a. 12/2015 MV: No ischemia/infarct. EF 55-65%.   Diabetes mellitus    Diastolic dysfunction    a. 12/2018 Echo: EF >65%, impaired relaxation; b. 01/2023 Echo: EF 65-70%, no rwma, Gr I DD, nl RV fxn, mild MR.   GERD (gastroesophageal reflux disease)    Hyperlipidemia    Hypertension    Mini stroke    Stroke [facility])    Past Surgical History:  Procedure Laterality Date   ABDOMINAL HYSTERECTOMY     BREAST EXCISIONAL BIOPSY Left 1980's   benign   CARDIAC CATHETERIZATION     CAROTID PTA/STENT INTERVENTION Left 02/12/2023   Procedure: CAROTID PTA/STENT INTERVENTION;  Surgeon: Renford Dills, MD;  Location: [facility] INVASIVE CV LAB;  Service: Cardiovascular;  Laterality: Left;   CHOLECYSTECTOMY      Allergies  Allergies  Allergen Reactions   Latex Hives and Rash    Other reaction(s): Other (See Comments)   Valsartan Shortness Of Breath    Mild sob after taking first and second dose of valsartan   Lidocaine Hives   Phenobarbital-Belladonna Alk     Other reaction(s): Hives & Rash   Phenobarbital-Belladonna Alk Hives and Rash    History of Present Illness    72 year old female with history of TIA/stroke, type 2 diabetes mellitus, carotid  stenosis status post left carotid stenting, hypertension, hyperlipidemia, chronic nausea and vomiting, chronic constipation, diarrhea, obstructive sleep apnea (not on CPAP), and diastolic dysfunction.  She previously underwent stress testing in 2017, which was nonischemic.  Echocardiogram in February 2020 showed normal LV function with diastolic dysfunction.  In March 2024, she was admitted with expressive aphasia, slurred speech, and left-sided facial numbness.  She was diagnosed with TIA.  She was found to have a 70% left internal carotid artery stenosis, which was successfully treated with stenting.  She was subsequently discharged but returned to the emergency department in early April with complaints of dyspnea and ongoing intermittent lightheadedness with difficulty focusing her vision since her TIA.  She was hypertensive in the emergency department.  ECG was unremarkable.  CTA of head and neck was notable for patent left carotid stent.  CT of the chest was negative for PE or other acute cardiopulmonary disease.  Troponins rose to 127.  Limited echo showed EF 60 to 65% with grade 1 diastolic dysfunction and mild MR.  She was seen by our team with recommendation for outpatient follow-up and consideration for coronary CT angiogram.  She was seen back in the ED on 4/22, due to dyspnea that started after taking valsartan.  W/u was unremarkable w/o evidence of rash or anaphylaxis.  ED provider felt that symptoms were most likely related to ARB allergy/mild angioedema, and valsartan was d/c'd.  Since then, she has felt well.  She has not had any chest pain or dyspnea.  Activity is somewhat limited to walking around her home with a cane.  She denies palpitations, PND, orthopnea, dizziness, syncope, edema, or early satiety.  She is interested in pursuing coronary CT angiogram.  Home Medications    Current Outpatient Medications  Medication Sig Dispense Refill   acetaminophen (TYLENOL) 500 MG tablet Take  500-1,000 mg by mouth every 8 (eight) hours as needed.     albuterol (VENTOLIN HFA) 108 (90 Base) MCG/ACT inhaler Inhale 1 puff into the lungs every 6 (six) hours as needed for wheezing.     aspirin 81 MG tablet Take 1 tablet (81 mg total) by mouth daily.     atorvastatin (LIPITOR) 40 MG tablet Take 1 tablet (40 mg total) by mouth daily. 90 tablet 3   carvedilol (COREG) 6.25 MG tablet Take 1 tablet (6.25 mg total) by mouth 2 (two) times daily with a meal. 180 tablet 3   cholecalciferol (VITAMIN D3) 25 MCG (1000 UNIT) tablet Take 1,000 Units by mouth daily.     clopidogrel (PLAVIX) 75 MG tablet Take 1 tablet (75 mg total) by mouth daily. 90 tablet 3   furosemide (LASIX) 20 MG tablet Take 1 tablet (20 mg total) by mouth every Monday, Wednesday, and Friday. 39 tablet 3   metFORMIN (GLUCOPHAGE-XR) 500 MG 24 hr tablet Take 500 mg by mouth in the morning and 1000 mg at hight.     methocarbamol (ROBAXIN) 500 MG tablet Take 500 mg by mouth every 8 (eight) hours as needed for muscle spasms.     metoprolol tartrate (LOPRESSOR) 50 MG tablet Take one tablet two hours before the test 1 tablet 0   Multiple Vitamin (MULTIVITAMIN WITH MINERALS) TABS tablet Take 1 tablet by mouth daily.     oxyCODONE-acetaminophen (PERCOCET/ROXICET) 5-325 MG tablet Take 1 tablet by mouth every 8 (eight) hours as needed for severe pain.     polyethylene glycol (MIRALAX) 17 g packet Take 17 g by mouth daily. 14 each 0   potassium chloride (KLOR-CON) 10 MEQ tablet Take 1 tablet (10 mEq total) by mouth every Monday, Wednesday, and Friday. 39 tablet 3   No current facility-administered medications for this visit.     Review of Systems    She denies chest pain, palpitations, dyspnea, pnd, orthopnea, n, v, dizziness, syncope, edema, weight gain, or early satiety.  All other systems reviewed and are otherwise negative except as noted above.    Physical Exam    VS:  BP 130/70   Pulse 74   Ht 5\' 6"  (1.676 m)   Wt 197 lb 2 oz (89.4  kg)   SpO2 99%   BMI 31.82 kg/m  , BMI Body mass index is 31.82 kg/m.     Vitals:   03/21/23 1527 03/21/23 1602  BP: (!) 140/68 130/70  Pulse: 74   SpO2: 99%     GEN: Well nourished, well developed, in no acute distress. HEENT: normal. Neck: Supple, no JVD, carotid bruits, or masses. Cardiac: RRR, no murmurs, rubs, or gallops. No clubbing, cyanosis, edema.  Radials 2+/PT 2+ and equal bilaterally.  Respiratory:  Respirations regular and unlabored, clear to auscultation bilaterally. GI: Soft, nontender, nondistended, BS + x 4. MS: no deformity or atrophy. Skin: warm and dry, no rash. Neuro:  Strength and sensation are intact. Psych: Normal affect.  Accessory Clinical Findings    ECG personally reviewed by me today -regular sinus rhythm, 72- no acute changes.  Lab Results  Component Value Date   WBC 7.3 03/10/2023   HGB 10.8 (L) 03/10/2023   HCT 33.9 (L) 03/10/2023   MCV 94.4 03/10/2023   PLT 270 03/10/2023   Lab Results  Component Value Date   CREATININE 0.84 03/10/2023   BUN 9 03/10/2023   NA 138 03/10/2023   K 3.6 03/10/2023   CL 106 03/10/2023   CO2 19 (L) 03/10/2023   Lab Results  Component Value Date   ALT 11 03/07/2023   AST 18 03/07/2023   ALKPHOS 64 03/07/2023   BILITOT 0.6 03/07/2023   Lab Results  Component Value Date   CHOL 143 02/11/2023   HDL 60 02/11/2023   LDLCALC 71 02/11/2023   TRIG 59 02/11/2023   CHOLHDL 2.4 02/11/2023    Lab Results  Component Value Date   HGBA1C 7.0 (H) 02/10/2023    Assessment & Plan    1.  Demand ischemia/precordial chest pain and dyspnea on exertion: Patient recently evaluated in the emergency department in the setting of dyspnea and troponin elevation to 127.  Prior echo March 2024 showed normal LV function.  She is known to have coronary calcium CT scanning.  Since discharge, she has not been experiencing chest pain or significant dyspnea however, activity is fairly limited.  We agreed to pursue coronary CT  angiogram for risk stratification.  Continue aspirin, statin, beta-blocker.  2.  Essential hypertension: Blood pressure initially elevated at 140/68.  130/70 on repeat.  Continue carvedilol therapy.  Valsartan recently discontinued in the setting of concern for for allergy/mild angioedema with dyspnea.  3.  Hyperlipidemia: LDL of 71 in March.  She is now on statin therapy and will need future outpatient surveillance.  4.  TIA/carotid arterial disease: Status post left carotid stenting in March.  Patent stent on CTA in April.  Followed by vascular surgery.  Continue aspirin, Plavix, statin therapy.  5.  Type 2 diabetes mellitus: A1c 7.0 in March.  On metformin and followed by primary care.  6.  Disposition: Follow-up coronary CT angiogram for risk stratification in the setting of above.  Follow-up in clinic in 4 to 6 weeks or sooner if necessary.  Nicolasa Ducking, NP 03/21/2023, 4:13 PM

## 2023-03-28 ENCOUNTER — Telehealth (HOSPITAL_COMMUNITY): Payer: Self-pay | Admitting: *Deleted

## 2023-03-28 NOTE — Telephone Encounter (Signed)
Attempted to call patient to reschedule her cardiac CT due to lack of insurance authorization. Left message on voicemail with name and callback number  Larey Brick RN Navigator Cardiac Imaging [facility] [phone] Office [phone] Cell

## 2023-03-31 ENCOUNTER — Ambulatory Visit (HOSPITAL_COMMUNITY): Admission: RE | Admit: 2023-03-31 | Payer: Medicare Other | Source: Ambulatory Visit

## 2023-04-25 ENCOUNTER — Telehealth: Payer: Self-pay | Admitting: *Deleted

## 2023-04-25 DIAGNOSIS — R072 Precordial pain: Secondary | ICD-10-CM

## 2023-04-25 NOTE — Telephone Encounter (Signed)
Patient has been made aware and Lexiscan Myoview has been ordered.   Rockwell Alexandria,  This pts cor CTA was denied by [facility].  Would you pls reach out to her to let her know that we won't be able to schedule.  We should try and get her a lexiscan myoview if possible, given h/o chest pain and mild hsTroponin elevation at ED visit earlier this year.  Thanks,  Thayer Ohm  Your provider has ordered a [employer] Stress test. This will take place at [facility]. Please report to the [facility] entrance. The volunteers at the first desk will direct you where to go.  [facility] MYOVIEW  Your provider has ordered a Stress Test with nuclear imaging. The purpose of this test is to evaluate the blood supply to your heart muscle. This procedure is referred to as a "Non-Invasive Stress Test." This is because other than having an IV started in your vein, nothing is inserted or "invades" your body. Cardiac stress tests are done to find areas of poor blood flow to the heart by determining the extent of coronary artery disease (CAD). Some patients exercise on a treadmill, which naturally increases the blood flow to your heart, while others who are unable to walk on a treadmill due to physical limitations will have a pharmacologic/chemical stress agent called Lexiscan . This medicine will mimic walking on a treadmill by temporarily increasing your coronary blood flow.   Please note: these test may take anywhere between 2-4 hours to complete  How to prepare for your Myoview test:  Nothing to eat for 6 hours prior to the test No caffeine for 24 hours prior to test No smoking 24 hours prior to test. Your medication may be taken with water.  If your doctor stopped a medication because of this test, do not take that medication. Ladies, please do not wear dresses.  Skirts or pants are appropriate. Please wear a short sleeve shirt. No perfume, cologne or lotion. Wear comfortable walking shoes. No heels! Hold the furosemide the  morning of the test   PLEASE NOTIFY THE OFFICE AT LEAST 24 HOURS IN ADVANCE IF YOU ARE UNABLE TO KEEP YOUR APPOINTMENT.  [phone] AND  PLEASE NOTIFY NUCLEAR MEDICINE AT [facility] AT LEAST 24 HOURS IN ADVANCE IF YOU ARE UNABLE TO KEEP YOUR APPOINTMENT. [phone]

## 2023-04-30 ENCOUNTER — Ambulatory Visit: Payer: Medicare Other | Admitting: Nurse Practitioner

## 2023-05-01 ENCOUNTER — Ambulatory Visit: Payer: Medicare Other

## 2023-05-06 ENCOUNTER — Encounter
Admission: RE | Admit: 2023-05-06 | Discharge: 2023-05-06 | Disposition: A | Discharge status: Home / Self Care | Payer: Medicare Other | Source: Ambulatory Visit | Attending: Nurse Practitioner | Admitting: Nurse Practitioner

## 2023-05-06 DIAGNOSIS — R072 Precordial pain: Secondary | ICD-10-CM | POA: Diagnosis present

## 2023-05-06 LAB — NM MYOCAR MULTI W/SPECT W/WALL MOTION / EF
LV dias vol: 69 mL (ref 46–106)
Nuc Stress EF: 68 %
Percent HR: 64 %
SRS: 1
SSS: 7
Stress Nuclear Isotope Dose: 29.6 mCi

## 2023-05-06 MED ORDER — TECHNETIUM TC 99M TETROFOSMIN IV KIT
30.0000 | PACK | Freq: Once | INTRAVENOUS | Status: AC | PRN
  Administered 2023-05-06: 29.57 via INTRAVENOUS

## 2023-05-06 MED ORDER — TECHNETIUM TC 99M TETROFOSMIN IV KIT
10.0000 | PACK | Freq: Once | INTRAVENOUS | Status: AC
  Administered 2023-05-06: 9.93 via INTRAVENOUS

## 2023-05-06 MED ORDER — REGADENOSON 0.4 MG/5ML IV SOLN
0.4000 mg | Freq: Once | INTRAVENOUS | Status: AC
  Administered 2023-05-06: 0.4 mg via INTRAVENOUS
  Filled 2023-05-06: qty 5

## 2023-05-07 LAB — NM MYOCAR MULTI W/SPECT W/WALL MOTION / EF
Base ST Depression (mm): 0 mm
LV sys vol: 28 mL
Peak HR: 96 {beats}/min
Rest HR: 62 {beats}/min
Rest Nuclear Isotope Dose: 9.9 mCi
SDS: 10
ST Depression (mm): 0 mm
TID: 0.95

## 2023-05-18 NOTE — Progress Notes (Unsigned)
Cardiology Office Note  Date:  05/19/2023   ID:  Destiny Savage, DOB [birth year], MRN [id number]  PCP:  Jaclyn Shaggy, MD   Chief Complaint  Patient presents with   Follow up stress test     Patient c/o tiredness/exhaustion most days. Medications reviewed by the patient verbally.     HPI:  Destiny Savage is a 72 y.o. female with history of  TIA/stroke,  DM2,  HTN,  HLD,  long history of nausea/vomiting/diarrhea without etiology, previously seen by GI at [facility] previous symptoms of chest discomfort,  Last stress test February 2017 showing no ischemia OSA, Not using her CPAP Stroke March 2024, Carotid stenosis with stent placed on left who presents for routine follow-up of her chest pain symptoms  Last seen in clinic by myself July 2023  Recent events reviewed in detail with her Echo April 2024 EF 60 to 65%   hospital February 13, 2023 events reviewed  acute onset of expressive dysphagia and slurred speech with associated left-sided numbness, funny feeling in her head with associated mild left facial numbness, difficulty with ambulation, 70% proximal left carotid artery stenosis noted, status post stent February 12, 2023, on aspirin Plavix  In the ER February 28, 2023, shortness of breath, fuzziness in her head, had a troponin Elevation  Back in the ER March 10, 2023 with concerns of shortness of breath after starting valsartan Valsartan was held, symptoms improved Cardiac CTA was ordered, this was not completed Myoview performed May 06, 2023, subtle apical defect noted, cannot rule out artifact  A1C 7.0 Total chol 143, LDL 71  Abdominal pain stable, history of chronic constipation requiring MiraLAX and lactulose  Walks with a cane  Other past medical history reviewed Prior office visit, frequent headaches In the ER 12/16/17 and 11/24/2017  for H/A MRI normal CT head ok Was given a prescription for Fioricet  Stress at home Husband with cancer, CLL? On chemo, XRT  hospital  01/15/17,  H/A with nausea Tingling in legs CT head with no acute findings MRI head with small vessel disease MRI lumbar spine, mild DJD Carotid <49% disease  She is discharged without clear diagnosis   lost her mother November 2017  Admission to [facility] 12/24/2015 with chest pain Negative cardiac enzymes, EKG relatively unchanged  Echo showed EF 60-65%, no RWMA, GR1DD, PASP 47 mm Hg, and mild to moderate posterior pericardial effusion without evidence of hemodynamic compromise.  She remained chest pain free throughout her admission.    stress test 12/2015  no ischemia normal ejection fraction She denies any further episodes of chest pain    admitted to [facility] in 11/2001 for chest pain Dr. Juliann Pares performed a cardiac cath that did not show any significant coroanry disease.   PMH:   has a past medical history of Arthritis, Asthma, Carotid stenosis, Chest pain, Diabetes mellitus, Diastolic dysfunction, GERD (gastroesophageal reflux disease), Hyperlipidemia, Hypertension, Mini stroke, and Stroke (HCC).  PSH:    Past Surgical History:  Procedure Laterality Date   ABDOMINAL HYSTERECTOMY     BREAST EXCISIONAL BIOPSY Left 1980's   benign   CARDIAC CATHETERIZATION     CAROTID PTA/STENT INTERVENTION Left 02/12/2023   Procedure: CAROTID PTA/STENT INTERVENTION;  Surgeon: Renford Dills, MD;  Location: [facility] INVASIVE CV LAB;  Service: Cardiovascular;  Laterality: Left;   CHOLECYSTECTOMY      Current Outpatient Medications  Medication Sig Dispense Refill   acetaminophen (TYLENOL) 500 MG tablet Take 500-1,000 mg by mouth every 8 (eight) hours  as needed.     albuterol (VENTOLIN HFA) 108 (90 Base) MCG/ACT inhaler Inhale 1 puff into the lungs every 6 (six) hours as needed for wheezing.     aspirin 81 MG tablet Take 1 tablet (81 mg total) by mouth daily.     atorvastatin (LIPITOR) 40 MG tablet Take 1 tablet (40 mg total) by mouth daily. 90 tablet 3   carvedilol (COREG) 6.25 MG tablet Take 1 tablet  (6.25 mg total) by mouth 2 (two) times daily with a meal. 180 tablet 3   cholecalciferol (VITAMIN D3) 25 MCG (1000 UNIT) tablet Take 1,000 Units by mouth daily.     clopidogrel (PLAVIX) 75 MG tablet Take 1 tablet (75 mg total) by mouth daily. 90 tablet 3   furosemide (LASIX) 20 MG tablet Take 1 tablet (20 mg total) by mouth every Monday, Wednesday, and Friday. 39 tablet 3   metFORMIN (GLUCOPHAGE-XR) 500 MG 24 hr tablet Take 500 mg by mouth in the morning and 1000 mg at hight.     Multiple Vitamin (MULTIVITAMIN WITH MINERALS) TABS tablet Take 1 tablet by mouth daily.     oxyCODONE-acetaminophen (PERCOCET/ROXICET) 5-325 MG tablet Take 1 tablet by mouth every 8 (eight) hours as needed for severe pain.     polyethylene glycol (MIRALAX) 17 g packet Take 17 g by mouth daily. 14 each 0   potassium chloride (KLOR-CON) 10 MEQ tablet Take 1 tablet (10 mEq total) by mouth every Monday, Wednesday, and Friday. 39 tablet 3   methocarbamol (ROBAXIN) 500 MG tablet Take 500 mg by mouth every 8 (eight) hours as needed for muscle spasms. (Patient not taking: Reported on 05/19/2023)     No current facility-administered medications for this visit.     Allergies:   Latex, Valsartan, Lidocaine, Phenobarbital-belladonna alk, and Phenobarbital-belladonna alk   Social History:  The patient  reports that she has never smoked. She has never used smokeless tobacco. She reports that she does not drink alcohol and does not use drugs.   Family History:   family history includes CAD in an other family member; Heart Problems in her father; Hypertension in an other family member.   Review of Systems: Review of Systems  Constitutional:  Positive for malaise/fatigue.  HENT: Negative.    Respiratory: Negative.    Cardiovascular: Negative.   Gastrointestinal: Negative.   Musculoskeletal: Negative.   Neurological: Negative.   Psychiatric/Behavioral: Negative.    All other systems reviewed and are negative.  PHYSICAL  EXAM: VS:  BP (!) 140/60 (BP Location: Left Arm, Patient Position: Sitting, Cuff Size: Normal)   Pulse 74   Ht 5\' 4"  (1.626 m)   Wt 197 lb 8 oz (89.6 kg)   SpO2 98%   BMI 33.90 kg/m  , BMI Body mass index is 33.9 kg/m. Constitutional:  oriented to person, place, and time. No distress.  HENT:  Head: Grossly normal Eyes:  no discharge. No scleral icterus.  Neck: No JVD, no carotid bruits  Cardiovascular: Regular rate and rhythm, no murmurs appreciated Pulmonary/Chest: Clear to auscultation bilaterally, no wheezes or rails Abdominal: Soft.  no distension.  no tenderness.  Musculoskeletal: Normal range of motion Neurological:  normal muscle tone. Coordination normal. No atrophy Skin: Skin warm and dry Psychiatric: normal affect, pleasant  Recent Labs: 02/13/2023: Magnesium 1.7 02/28/2023: B Natriuretic Peptide 30.9 03/07/2023: ALT 11 03/10/2023: BUN 9; Creatinine, Ser 0.84; Hemoglobin 10.8; Platelets 270; Potassium 3.6; Sodium 138    Lipid Panel Lab Results  Component Value Date  CHOL 143 02/11/2023   HDL 60 02/11/2023   LDLCALC 71 02/11/2023   TRIG 59 02/11/2023    Wt Readings from Last 3 Encounters:  05/19/23 197 lb 8 oz (89.6 kg)  03/21/23 197 lb 2 oz (89.4 kg)  02/27/23 200 lb (90.7 kg)     ASSESSMENT AND PLAN:  Essential hypertension -  Blood pressure high end of the range, recommend she continue to monitor blood pressure closely at home Continue carvedilol 6.25 twice daily, Lasix 20 mg 3 days a week Recently taken off valsartan secondary to side effects  Mixed hyperlipidemia - Cholesterol is at goal on the current lipid regimen. No changes to the medications were made.  Cerebrovascular accident (CVA), unspecified mechanism (HCC)  Long history of headaches, stable previous MRI with no clear finding of stroke Denies any recent headaches  Controlled type 2 diabetes mellitus without complication, without long-term current use of insulin (HCC) - A1C 7.0,  discussed strategies with calorie restriction  Chronic constipation GI regiment with MiraLAX and lactulose    Total encounter time more than 40 minutes  Greater than 50% was spent in counseling and coordination of care with the patient   No orders of the defined types were placed in this encounter.    Signed, Dossie Arbour, M.D., Ph.D. 05/19/2023  [facility] [place], [place] [phone]

## 2023-05-19 ENCOUNTER — Encounter: Payer: Self-pay | Admitting: Cardiovascular Disease

## 2023-05-19 ENCOUNTER — Ambulatory Visit: Payer: Medicare Other | Attending: Nurse Practitioner | Admitting: Cardiovascular Disease

## 2023-05-19 VITALS — BP 140/60 | HR 74 | Ht 64.0 in | Wt 197.5 lb

## 2023-05-19 DIAGNOSIS — I6522 Occlusion and stenosis of left carotid artery: Secondary | ICD-10-CM | POA: Diagnosis not present

## 2023-05-19 DIAGNOSIS — Z8673 Personal history of transient ischemic attack (TIA), and cerebral infarction without residual deficits: Secondary | ICD-10-CM | POA: Diagnosis not present

## 2023-05-19 DIAGNOSIS — Z7984 Long term (current) use of oral hypoglycemic drugs: Secondary | ICD-10-CM

## 2023-05-19 DIAGNOSIS — E782 Mixed hyperlipidemia: Secondary | ICD-10-CM

## 2023-05-19 DIAGNOSIS — R072 Precordial pain: Secondary | ICD-10-CM

## 2023-05-19 DIAGNOSIS — I739 Peripheral vascular disease, unspecified: Secondary | ICD-10-CM

## 2023-05-19 DIAGNOSIS — R079 Chest pain, unspecified: Secondary | ICD-10-CM

## 2023-05-19 DIAGNOSIS — E118 Type 2 diabetes mellitus with unspecified complications: Secondary | ICD-10-CM

## 2023-05-19 DIAGNOSIS — I10 Essential (primary) hypertension: Secondary | ICD-10-CM

## 2023-05-19 NOTE — Patient Instructions (Signed)
Medication Instructions:  No changes  If you need a refill on your cardiac medications before your next appointment, please call your pharmacy.   Lab work: No new labs needed  Testing/Procedures: No new testing needed  Follow-Up: At [facility], you and your health needs are our priority.  As part of our continuing mission to provide you with exceptional heart care, we have created designated Provider Care Teams.  These Care Teams include your primary Cardiologist (physician) and Advanced Practice Providers (APPs -  Physician Assistants and Nurse Practitioners) who all work together to provide you with the care you need, when you need it.  You will need a follow up appointment in 12 months  Providers on your designated Care Team:   Christopher Berge, NP Ryan Dunn, PA-C Cadence Furth, PA-C  COVID-19 Vaccine Information can be found at: [email or web address] For questions related to vaccine distribution or appointments, please email [email or web address] or call [phone].   

## 2023-05-29 ENCOUNTER — Ambulatory Visit: Payer: Medicare Other | Admitting: Nurse Practitioner

## 2023-06-23 ENCOUNTER — Encounter: Payer: Self-pay | Admitting: Cardiovascular Disease

## 2023-07-14 ENCOUNTER — Other Ambulatory Visit: Payer: Self-pay

## 2023-07-14 ENCOUNTER — Emergency Department: Payer: Medicare Other

## 2023-07-14 ENCOUNTER — Emergency Department
Admission: EM | Admit: 2023-07-14 | Discharge: 2023-07-14 | Disposition: A | Discharge status: Home / Self Care | Payer: Medicare Other | Attending: Emergency Medicine | Admitting: Emergency Medicine

## 2023-07-14 DIAGNOSIS — K5900 Constipation, unspecified: Secondary | ICD-10-CM | POA: Diagnosis not present

## 2023-07-14 DIAGNOSIS — E119 Type 2 diabetes mellitus without complications: Secondary | ICD-10-CM | POA: Insufficient documentation

## 2023-07-14 DIAGNOSIS — I10 Essential (primary) hypertension: Secondary | ICD-10-CM | POA: Insufficient documentation

## 2023-07-14 DIAGNOSIS — R1032 Left lower quadrant pain: Secondary | ICD-10-CM

## 2023-07-14 LAB — COMPREHENSIVE METABOLIC PANEL
ALT: 12 U/L (ref 0–44)
AST: 18 U/L (ref 15–41)
Albumin: 3.8 g/dL (ref 3.5–5.0)
Alkaline Phosphatase: 59 U/L (ref 38–126)
Anion gap: 10 (ref 5–15)
BUN: 13 mg/dL (ref 8–23)
CO2: 23 mmol/L (ref 22–32)
Calcium: 9.1 mg/dL (ref 8.9–10.3)
Chloride: 102 mmol/L (ref 98–111)
Creatinine, Ser: 0.84 mg/dL (ref 0.44–1.00)
GFR, Estimated: >60 mL/min (ref >60)
Glucose, Bld: 176 mg/dL — ABNORMAL HIGH (ref 70–99)
Potassium: 3.6 mmol/L (ref 3.5–5.1)
Sodium: 135 mmol/L (ref 135–145)
Total Bilirubin: 0.3 mg/dL (ref 0.3–1.2)
Total Protein: 6.6 g/dL (ref 6.5–8.1)

## 2023-07-14 LAB — CBC
HCT: 34.2 % — ABNORMAL LOW (ref 36.0–46.0)
Hemoglobin: 11.3 g/dL — ABNORMAL LOW (ref 12.0–15.0)
MCH: 30.2 pg (ref 26.0–34.0)
MCHC: 33 g/dL (ref 30.0–36.0)
MCV: 91.4 fL (ref 80.0–100.0)
Platelets: 219 10*3/uL (ref 150–400)
RBC: 3.74 MIL/uL — ABNORMAL LOW (ref 3.87–5.11)
RDW: 13.9 % (ref 11.5–15.5)
WBC: 8.8 10*3/uL (ref 4.0–10.5)
nRBC: 0 % (ref 0.0–0.2)

## 2023-07-14 LAB — URINALYSIS, ROUTINE W REFLEX MICROSCOPIC
Bilirubin Urine: NEGATIVE
Glucose, UA: NEGATIVE mg/dL
Ketones, ur: NEGATIVE mg/dL
Leukocytes,Ua: NEGATIVE
Nitrite: NEGATIVE
Protein, ur: NEGATIVE mg/dL
Specific Gravity, Urine: 1.009 (ref 1.005–1.030)
pH: 6 (ref 5.0–8.0)

## 2023-07-14 LAB — LIPASE, BLOOD: Lipase: 53 U/L — ABNORMAL HIGH (ref 11–51)

## 2023-07-14 MED ORDER — IOHEXOL 300 MG/ML  SOLN
100.0000 mL | Freq: Once | INTRAMUSCULAR | Status: AC | PRN
  Administered 2023-07-14: 100 mL via INTRAVENOUS

## 2023-07-14 MED ORDER — POLYETHYLENE GLYCOL 3350 17 GM/SCOOP PO POWD
ORAL | 0 refills | Status: AC
Start: 2023-07-14 — End: ?

## 2023-07-14 MED ORDER — SODIUM CHLORIDE 0.9 % IV BOLUS
1000.0000 mL | Freq: Once | INTRAVENOUS | Status: AC
  Administered 2023-07-14: 1000 mL via INTRAVENOUS

## 2023-07-14 MED ORDER — MORPHINE SULFATE (PF) 2 MG/ML IV SOLN
2.0000 mg | Freq: Once | INTRAVENOUS | Status: AC
  Administered 2023-07-14: 2 mg via INTRAVENOUS
  Filled 2023-07-14: qty 1

## 2023-07-14 MED ORDER — KETOROLAC TROMETHAMINE 15 MG/ML IJ SOLN
7.5000 mg | Freq: Once | INTRAMUSCULAR | Status: AC
  Administered 2023-07-14: 7.5 mg via INTRAVENOUS
  Filled 2023-07-14: qty 1

## 2023-07-14 MED ORDER — MAGNESIUM CITRATE PO SOLN
1.0000 | Freq: Once | ORAL | Status: DC
  Filled 2023-07-14: qty 296

## 2023-07-14 MED ORDER — SENNOSIDES-DOCUSATE SODIUM 8.6-50 MG PO TABS: 2.0000 | ORAL_TABLET | Freq: Two times a day (BID) | ORAL | 0 refills | Status: AC

## 2023-07-14 NOTE — ED Notes (Addendum)
Patient c/o llq pain x 3-4 days that radiates down the leg. Patient denies n/v.  Patient denies urinary symptoms at this time, and states that her last BM was 2 days ago, but that is not unusual. Resp even and unlabored. Skin pwd. Mae.
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
## 2023-07-14 NOTE — ED Provider Notes (Signed)
[facility] Provider Note    Event Date/Time   First MD Initiated Contact with Patient 07/14/23 1804     (approximate)   History   Chief Complaint: Abdominal Pain   HPI  Destiny Savage is a 72 y.o. female with a history of hypertension diabetes who comes ED complaining of left lower quadrant pain for the past 3 days, also reports last bowel movement was about 4 days ago.  No vomiting.  No fever.  Nonradiating.     Physical Exam   Triage Vital Signs: ED Triage Vitals [07/14/23 1715]  Encounter Vitals Group     BP (!) 159/99     Systolic BP Percentile      Diastolic BP Percentile      Pulse Rate 70     Resp 18     Temp 98.5 F (36.9 C)     Temp src      SpO2 100 %     Weight 200 lb (90.7 kg)     Height 5\' 6"  (1.676 m)     Head Circumference      Peak Flow      Pain Score 9     Pain Loc      Pain Education      Exclude from Growth Chart     Most recent vital signs: Vitals:   07/14/23 1715 07/14/23 2118  BP: (!) 159/99 (!) 169/65  Pulse: 70 78  Resp: 18 16  Temp: 98.5 F (36.9 C)   SpO2: 100% 100%    General: Awake, no distress.  CV:  Good peripheral perfusion.  Regular rate rhythm Resp:  Normal effort.  Clear to auscultation bilaterally Abd:  No distention.  Soft with left lower quadrant tenderness Other:  Moist oral mucosa   ED Results / Procedures / Treatments   Labs (all labs ordered are listed, but only abnormal results are displayed) Labs Reviewed  LIPASE, BLOOD - Abnormal; Notable for the following components:      Result Value   Lipase 53 (*)    All other components within normal limits  COMPREHENSIVE METABOLIC PANEL - Abnormal; Notable for the following components:   Glucose, Bld 176 (*)    All other components within normal limits  CBC - Abnormal; Notable for the following components:   RBC 3.74 (*)    Hemoglobin 11.3 (*)    HCT 34.2 (*)    All other components within normal limits  URINALYSIS, ROUTINE W  REFLEX MICROSCOPIC - Abnormal; Notable for the following components:   Color, Urine STRAW (*)    APPearance CLEAR (*)    Hgb urine dipstick SMALL (*)    Bacteria, UA RARE (*)    All other components within normal limits     EKG    RADIOLOGY CT abdomen pelvis interpreted by me, negative for free air or bowel obstruction.  Radiology report reviewed   PROCEDURES:  Procedures   MEDICATIONS ORDERED IN ED: Medications  sodium chloride 0.9 % bolus 1,000 mL (0 mLs Intravenous Stopped 07/14/23 2046)  ketorolac (TORADOL) 15 MG/ML injection 7.5 mg (7.5 mg Intravenous Given 07/14/23 1840)  morphine (PF) 2 MG/ML injection 2 mg (2 mg Intravenous Given 07/14/23 1840)  iohexol (OMNIPAQUE) 300 MG/ML solution 100 mL (100 mLs Intravenous Contrast Given 07/14/23 1849)     IMPRESSION / MDM / ASSESSMENT AND PLAN / ED COURSE  I reviewed the triage vital signs and the nursing notes.  DDx: Diverticulitis, intra-abdominal abscess, bowel  perforation, bowel obstruction, constipation, dehydration  Patient's presentation is most consistent with acute presentation with potential threat to life or bodily function.  Patient presents with left lower quadrant abdominal pain with tenderness.  Vital signs unremarkable, serum labs unremarkable.  Urinalysis normal.  Will give IV fluids, pain medication, obtain CT.   Clinical Course as of 07/14/23 2319  Mon Jul 14, 2023  1950 CT = cn. Will give mag citrate [PS]    Clinical Course User Index [PS] Sharman Cheek, MD    ----------------------------------------- 9:14 PM on 07/14/2023 ----------------------------------------- Mag citrate on backorder, will discharge with Senokot and MiraLAX, follow-up PCP.   FINAL CLINICAL IMPRESSION(S) / ED DIAGNOSES   Final diagnoses:  Left lower quadrant abdominal pain  Constipation, unspecified constipation type     Rx / DC Orders   ED Discharge Orders          Ordered    senna-docusate (SENOKOT-S) 8.6-50  MG tablet  2 times daily        07/14/23 2114    polyethylene glycol powder (GLYCOLAX/MIRALAX) 17 GM/SCOOP powder        07/14/23 2114             Note:  This document was prepared using Dragon voice recognition software and may include unintentional dictation errors.   Sharman Cheek, MD 07/14/23 [phone]

## 2023-07-14 NOTE — ED Triage Notes (Signed)
Pt to ED for LLQ pain for the past few days with nausea. Denies urinary sx.

## 2023-07-16 ENCOUNTER — Ambulatory Visit: Payer: Medicare Other | Admitting: Neurology

## 2023-07-21 ENCOUNTER — Other Ambulatory Visit: Payer: Self-pay | Admitting: Cardiovascular Disease

## 2023-08-01 ENCOUNTER — Other Ambulatory Visit: Payer: Self-pay

## 2023-08-01 ENCOUNTER — Emergency Department
Admission: EM | Admit: 2023-08-01 | Discharge: 2023-08-01 | Disposition: A | Discharge status: Home / Self Care | Payer: Medicare Other | Attending: Emergency Medicine | Admitting: Emergency Medicine

## 2023-08-01 ENCOUNTER — Encounter: Payer: Self-pay | Admitting: Emergency Medicine

## 2023-08-01 DIAGNOSIS — M79604 Pain in right leg: Secondary | ICD-10-CM | POA: Diagnosis present

## 2023-08-01 DIAGNOSIS — M5431 Sciatica, right side: Secondary | ICD-10-CM | POA: Diagnosis not present

## 2023-08-01 MED ORDER — KETOROLAC TROMETHAMINE 30 MG/ML IJ SOLN
30.0000 mg | Freq: Once | INTRAMUSCULAR | Status: AC
  Administered 2023-08-01: 30 mg via INTRAMUSCULAR
  Filled 2023-08-01: qty 1

## 2023-08-01 MED ORDER — METHOCARBAMOL 500 MG PO TABS
500.0000 mg | ORAL_TABLET | Freq: Once | ORAL | Status: AC
  Administered 2023-08-01: 500 mg via ORAL
  Filled 2023-08-01: qty 1

## 2023-08-01 MED ORDER — LIDOCAINE 5 % EX PTCH: 1.0000 | MEDICATED_PATCH | Freq: Two times a day (BID) | CUTANEOUS | 0 refills | Status: AC

## 2023-08-01 MED ORDER — ACETAMINOPHEN 500 MG PO TABS
1000.0000 mg | ORAL_TABLET | Freq: Once | ORAL | Status: AC
  Administered 2023-08-01: 1000 mg via ORAL
  Filled 2023-08-01: qty 2

## 2023-08-01 MED ORDER — LIDOCAINE 5 % EX PTCH
1.0000 | MEDICATED_PATCH | CUTANEOUS | Status: DC
  Administered 2023-08-01: 1 via TRANSDERMAL
  Filled 2023-08-01: qty 1

## 2023-08-01 NOTE — Discharge Instructions (Addendum)
Use Tylenol for pain and fevers.  Up to 1000 mg per dose, up to 4 times per day.  Do not take more than 4000 mg of Tylenol/acetaminophen within 24 hours.. ° °Please use lidocaine patches at your site of pain.  Apply 1 patch at a time, leave on for 12 hours, then remove for 12 hours.  12 hours on, 12 hours off.  Do not apply more than 1 patch at a time. ° °

## 2023-08-01 NOTE — ED Triage Notes (Signed)
Pt to triage via w/c with no distress noted; pt reports pain to rt thigh since yesterday with no injury; st hx of same but was never told cause

## 2023-08-01 NOTE — ED Notes (Signed)
ED Provider at bedside. 

## 2023-08-01 NOTE — ED Provider Notes (Signed)
[facility] Provider Note    Event Date/Time   First MD Initiated Contact with Patient 08/01/23 [phone]     (approximate)   History   Leg Pain   HPI  Destiny Savage is a 72 y.o. female who presents to the ED for evaluation of Leg Pain   Review of cardiology clinic visit from 7/1.  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  Left carotid stent placement back in March.  Patient presents for evaluation of subacute right thigh pain.  Reports radiating pain from her thigh towards her lower back and wrapping around towards her medial knee, all on the right side.  Reports pain is similar with exertion and with rest.  No trauma or injuries.  No weakness to the foot.    Physical Exam   Triage Vital Signs: ED Triage Vitals  Encounter Vitals Group     BP      Systolic BP Percentile      Diastolic BP Percentile      Pulse      Resp      Temp      Temp src      SpO2      Weight      Height      Head Circumference      Peak Flow      Pain Score      Pain Loc      Pain Education      Exclude from Growth Chart     Most recent vital signs: Vitals:   08/01/23 0447 08/01/23 0600  BP: (!) 188/82 (!) 167/77  Pulse: 70 64  Resp: 18 17  Temp: 98.3 F (36.8 C)   SpO2: 100% 100%    General: Awake, no distress.  CV:  Good peripheral perfusion.  Resp:  Normal effort.  Abd:  No distention.  MSK:  No deformity noted.  Right-sided paraspinal lumbar tenderness and right hip tenderness that reproduces her shooting pain.  No midline back tenderness Neuro:  No focal deficits appreciated. Other:  Strong bilateral DP pulses.  ABI greater than 1.  No evidence of ischemia, lower extremity swelling.  Capillary refill is brisk.   ED Results / Procedures / Treatments   Labs (all labs ordered are listed, but only abnormal results are displayed) Labs Reviewed - No data to display  EKG   RADIOLOGY   Official radiology report(s): No results  found.  PROCEDURES and INTERVENTIONS:  Procedures  Medications  lidocaine (LIDODERM) 5 % 1 patch (1 patch Transdermal Patch Applied 08/01/23 0522)  acetaminophen (TYLENOL) tablet 1,000 mg (1,000 mg Oral Given 08/01/23 0520)  ketorolac (TORADOL) 30 MG/ML injection 30 mg (30 mg Intramuscular Given 08/01/23 0520)  methocarbamol (ROBAXIN) tablet 500 mg (500 mg Oral Given 08/01/23 0520)     IMPRESSION / MDM / ASSESSMENT AND PLAN / ED COURSE  I reviewed the triage vital signs and the nursing notes.  Differential diagnosis includes, but is not limited to, claudication or critical limb ischemia, sciatica, DVT  {Patient presents with symptoms of an acute illness or injury that is potentially life-threatening.  Vasculopath on DAPT presents with subacute right leg pain that is most consistent with sciatica.  No injuries or neurologic deficits to indicate imaging at this point.  She looks well.  Localized tenderness without overlying skin changes, swelling, bruising or other exam findings.  Will provide nonnarcotic multimodal analgesia and reassess with anticipation of outpatient management.  Clinical Course as of  08/01/23 0617  Fri Aug 01, 2023  0501 ABI > 1. 200/182ish [DS]  7322 Reassessed.  Pain resolved and she is feeling much better and is appreciative.  Discussed management at home and return precautions [DS]    Clinical Course User Index [DS] Delton Prairie, MD     FINAL CLINICAL IMPRESSION(S) / ED DIAGNOSES   Final diagnoses:  Right leg pain  Sciatica of right side     Rx / DC Orders   ED Discharge Orders          Ordered    lidocaine (LIDODERM) 5 %  Every 12 hours        08/01/23 0616             Note:  This document was prepared using Dragon voice recognition software and may include unintentional dictation errors.   Delton Prairie, MD 08/01/23 [phone]

## 2023-08-07 ENCOUNTER — Other Ambulatory Visit: Payer: Self-pay | Admitting: Cardiovascular Disease

## 2023-08-09 ENCOUNTER — Other Ambulatory Visit: Payer: Self-pay | Admitting: Cardiovascular Disease

## 2023-08-23 ENCOUNTER — Other Ambulatory Visit: Payer: Self-pay | Admitting: Cardiovascular Disease

## 2023-10-20 ENCOUNTER — Other Ambulatory Visit: Payer: Self-pay | Admitting: Cardiovascular Disease

## 2023-10-30 ENCOUNTER — Emergency Department: Payer: Medicare Other

## 2023-10-30 ENCOUNTER — Emergency Department
Admission: EM | Admit: 2023-10-30 | Discharge: 2023-10-31 | Disposition: A | Discharge status: Home / Self Care | Payer: Medicare Other | Attending: Emergency Medicine | Admitting: Emergency Medicine

## 2023-10-30 ENCOUNTER — Encounter: Payer: Self-pay | Admitting: Radiology

## 2023-10-30 ENCOUNTER — Other Ambulatory Visit: Payer: Self-pay

## 2023-10-30 DIAGNOSIS — K5900 Constipation, unspecified: Secondary | ICD-10-CM | POA: Insufficient documentation

## 2023-10-30 LAB — URINALYSIS, ROUTINE W REFLEX MICROSCOPIC
Bilirubin Urine: NEGATIVE
Glucose, UA: NEGATIVE mg/dL
Hgb urine dipstick: NEGATIVE
Ketones, ur: NEGATIVE mg/dL
Nitrite: NEGATIVE
Protein, ur: NEGATIVE mg/dL
Specific Gravity, Urine: >1.046 — ABNORMAL HIGH (ref 1.005–1.030)
pH: 5 (ref 5.0–8.0)

## 2023-10-30 LAB — COMPREHENSIVE METABOLIC PANEL
ALT: 14 U/L (ref 0–44)
AST: 19 U/L (ref 15–41)
Albumin: 4.1 g/dL (ref 3.5–5.0)
Alkaline Phosphatase: 63 U/L (ref 38–126)
Anion gap: 8 (ref 5–15)
BUN: 11 mg/dL (ref 8–23)
CO2: 26 mmol/L (ref 22–32)
Calcium: 9.6 mg/dL (ref 8.9–10.3)
Chloride: 101 mmol/L (ref 98–111)
Creatinine, Ser: 0.71 mg/dL (ref 0.44–1.00)
GFR, Estimated: >60 mL/min (ref >60)
Glucose, Bld: 134 mg/dL — ABNORMAL HIGH (ref 70–99)
Potassium: 4 mmol/L (ref 3.5–5.1)
Sodium: 135 mmol/L (ref 135–145)
Total Bilirubin: 0.6 mg/dL (ref <1.2)
Total Protein: 7.1 g/dL (ref 6.5–8.1)

## 2023-10-30 LAB — CBC
HCT: 37.2 % (ref 36.0–46.0)
Hemoglobin: 12.1 g/dL (ref 12.0–15.0)
MCH: 31 pg (ref 26.0–34.0)
MCHC: 32.5 g/dL (ref 30.0–36.0)
MCV: 95.4 fL (ref 80.0–100.0)
Platelets: 241 10*3/uL (ref 150–400)
RBC: 3.9 MIL/uL (ref 3.87–5.11)
RDW: 12.7 % (ref 11.5–15.5)
WBC: 8.6 10*3/uL (ref 4.0–10.5)
nRBC: 0 % (ref 0.0–0.2)

## 2023-10-30 LAB — LIPASE, BLOOD: Lipase: 261 U/L — ABNORMAL HIGH (ref 11–51)

## 2023-10-30 MED ORDER — MAGNESIUM CITRATE PO SOLN: 1.0000 | Freq: Once | ORAL | 1 refills | Status: AC

## 2023-10-30 MED ORDER — IOHEXOL 300 MG/ML  SOLN
100.0000 mL | Freq: Once | INTRAMUSCULAR | Status: AC | PRN
  Administered 2023-10-30: 100 mL via INTRAVENOUS

## 2023-10-30 NOTE — ED Provider Notes (Signed)
[facility] Provider Note    Event Date/Time   First MD Initiated Contact with Patient 10/30/23 2258     (approximate)   History   Abdominal Pain   HPI  Destiny Savage is a 72 y.o. female  who presents to the emergency department today with complaints of abdominal pain and constipation. The patient has had issues with constipation for years. States she was told it was due to IBS. Does take miralax occasionally when she starts feeling constipated. Will develop left sided abdominal pain which she has been having over the past couple of days. No nausea or vomiting, although does get decreased appetite. The patient denies any fevers.       Physical Exam   Triage Vital Signs: ED Triage Vitals  Encounter Vitals Group     BP 10/30/23 1517 (!) 146/95     Systolic BP Percentile --      Diastolic BP Percentile --      Pulse Rate 10/30/23 1517 67     Resp 10/30/23 1517 18     Temp 10/30/23 1517 98.2 F (36.8 C)     Temp Source 10/30/23 1517 Oral     SpO2 10/30/23 1517 97 %     Weight 10/30/23 1515 203 lb (92.1 kg)     Height 10/30/23 1515 5\' 6"  (1.676 m)     Head Circumference --      Peak Flow --      Pain Score --      Pain Loc --      Pain Education --      Exclude from Growth Chart --     Most recent vital signs: Vitals:   10/30/23 1517 10/30/23 2300  BP: (!) 146/95 (!) 143/69  Pulse: 67 66  Resp: 18   Temp: 98.2 F (36.8 C)   SpO2: 97% 100%   General: Awake, alert, oriented. CV:  Good peripheral perfusion. Regular rate and rhythm. Resp:  Normal effort. Lungs clear. Abd:  No distention. Non tender.   ED Results / Procedures / Treatments   Labs (all labs ordered are listed, but only abnormal results are displayed) Labs Reviewed  LIPASE, BLOOD - Abnormal; Notable for the following components:      Result Value   Lipase 261 (*)    All other components within normal limits  COMPREHENSIVE METABOLIC PANEL - Abnormal; Notable for the  following components:   Glucose, Bld 134 (*)    All other components within normal limits  URINALYSIS, ROUTINE W REFLEX MICROSCOPIC - Abnormal; Notable for the following components:   Color, Urine YELLOW (*)    APPearance CLEAR (*)    Specific Gravity, Urine >1.046 (*)    Leukocytes,Ua TRACE (*)    Bacteria, UA MANY (*)    All other components within normal limits  CBC     EKG  None   RADIOLOGY I independently interpreted and visualized the CT abd/pel. My interpretation: No free air Radiology interpretation:  IMPRESSION:  No acute findings.    Large amount of stool throughout the colon, suspicious for  constipation.      PROCEDURES:  Critical Care performed: No  MEDICATIONS ORDERED IN ED: Medications  iohexol (OMNIPAQUE) 300 MG/ML solution 100 mL (100 mLs Intravenous Contrast Given 10/30/23 1624)     IMPRESSION / MDM / ASSESSMENT AND PLAN / ED COURSE  I reviewed the triage vital signs and the nursing notes.  Differential diagnosis includes, but is not limited to, constipation, diverticulitis, kidney stone, UTI  Patient's presentation is most consistent with acute presentation with potential threat to life or bodily function.  Patient presents to the ER today because of concern for constipation and abdominal pain. On exam abd is benign. Blood work shows lipase elevation but no leukocytosis. CT abd/pel ordered in triage consistent with constipation, no other acute concerning findings. Discussed work up with patient. Will give enema here.  Patient had large volume BM after enema. Stated she felt better. At this time will plan on discharging. Will give prescription for magnesium citrate. Discussed taking miralax daily.  FINAL CLINICAL IMPRESSION(S) / ED DIAGNOSES   Final diagnoses:  Constipation, unspecified constipation type       Note:  This document was prepared using Dragon voice recognition software and may include  unintentional dictation errors.    Phineas Semen, MD 10/31/23 [phone]

## 2023-10-30 NOTE — ED Provider Triage Note (Signed)
Emergency Medicine Provider Triage Evaluation Note  Destiny Savage , a 72 y.o. female  was evaluated in triage.  Pt complains of llq pain.  Review of Systems  Positive:  Negative:   Physical Exam  BP (!) 146/95 (BP Location: Left Arm)   Pulse 67   Temp 98.2 F (36.8 C) (Oral)   Resp 18   Ht 5\' 6"  (1.676 m)   Wt 92.1 kg   SpO2 97%   BMI 32.77 kg/m  Gen:   Awake, no distress   Resp:  Normal effort  MSK:   Moves extremities without difficulty  Other:    Medical Decision Making  Medically screening exam initiated at 3:23 PM.  Appropriate orders placed.  Destiny Savage was informed that the remainder of the evaluation will be completed by another provider, this initial triage assessment does not replace that evaluation, and the importance of remaining in the ED until their evaluation is complete.     Faythe Ghee, PA-C 10/30/23 1523

## 2023-10-30 NOTE — ED Provider Notes (Incomplete)
[facility] Provider Note    Event Date/Time   First MD Initiated Contact with Patient 10/30/23 2258     (approximate)   History   Abdominal Pain   HPI  Destiny Savage is a 72 y.o. female  who presents to the emergency department today with complaints of abdominal pain and constipation. The patient has had issues with constipation for years. States she was told it was due to IBS. Does take miralax occasionally when she starts feeling constipated. Will develop left sided abdominal pain which she has been having over the past couple of days. No nausea or vomiting, although does get decreased appetite. The patient denies any fevers.       Physical Exam   Triage Vital Signs: ED Triage Vitals  Encounter Vitals Group     BP 10/30/23 1517 (!) 146/95     Systolic BP Percentile --      Diastolic BP Percentile --      Pulse Rate 10/30/23 1517 67     Resp 10/30/23 1517 18     Temp 10/30/23 1517 98.2 F (36.8 C)     Temp Source 10/30/23 1517 Oral     SpO2 10/30/23 1517 97 %     Weight 10/30/23 1515 203 lb (92.1 kg)     Height 10/30/23 1515 5\' 6"  (1.676 m)     Head Circumference --      Peak Flow --      Pain Score --      Pain Loc --      Pain Education --      Exclude from Growth Chart --     Most recent vital signs: Vitals:   10/30/23 1517 10/30/23 2300  BP: (!) 146/95 (!) 143/69  Pulse: 67 66  Resp: 18   Temp: 98.2 F (36.8 C)   SpO2: 97% 100%   General: Awake, alert, oriented. CV:  Good peripheral perfusion. Regular rate and rhythm. Resp:  Normal effort. Lungs clear. Abd:  No distention. Non tender.   ED Results / Procedures / Treatments   Labs (all labs ordered are listed, but only abnormal results are displayed) Labs Reviewed  LIPASE, BLOOD - Abnormal; Notable for the following components:      Result Value   Lipase 261 (*)    All other components within normal limits  COMPREHENSIVE METABOLIC PANEL - Abnormal; Notable for the  following components:   Glucose, Bld 134 (*)    All other components within normal limits  URINALYSIS, ROUTINE W REFLEX MICROSCOPIC - Abnormal; Notable for the following components:   Color, Urine YELLOW (*)    APPearance CLEAR (*)    Specific Gravity, Urine >1.046 (*)    Leukocytes,Ua TRACE (*)    Bacteria, UA MANY (*)    All other components within normal limits  CBC     EKG  ***   RADIOLOGY I independently interpreted and visualized the CT abd/pel. My interpretation: No free air Radiology interpretation:  IMPRESSION:  No acute findings.    Large amount of stool throughout the colon, suspicious for  constipation.      PROCEDURES:  Critical Care performed: Yes  CRITICAL CARE Performed by: Phineas Semen   Total critical care time: *** minutes  Critical care time was exclusive of separately billable procedures and treating other patients.  Critical care was necessary to treat or prevent imminent or life-threatening deterioration.  Critical care was time spent personally by me on the following activities:  development of treatment plan with patient and/or surrogate as well as nursing, discussions with consultants, evaluation of patient's response to treatment, examination of patient, obtaining history from patient or surrogate, ordering and performing treatments and interventions, ordering and review of laboratory studies, ordering and review of radiographic studies, pulse oximetry and re-evaluation of patient's condition.   Procedures    MEDICATIONS ORDERED IN ED: Medications  iohexol (OMNIPAQUE) 300 MG/ML solution 100 mL (100 mLs Intravenous Contrast Given 10/30/23 1624)     IMPRESSION / MDM / ASSESSMENT AND PLAN / ED COURSE  I reviewed the triage vital signs and the nursing notes.                              Differential diagnosis includes, but is not limited to, constipation, diverticulitis, kidney stone, UTI  Patient's presentation is most  consistent with acute presentation with potential threat to life or bodily function.  Patient presents to the ER today because of concern for constipation and abdominal pain. On exam abd is benign. Blood work shows lipase elevation but no leukocytosis. CT abd/pel ordered in triage consistent with constipation, no other acute concerning findings. Discussed work up with patient. Will give enema here.   FINAL CLINICAL IMPRESSION(S) / ED DIAGNOSES   Final diagnoses:  None     Rx / DC Orders   ED Discharge Orders     None        Note:  This document was prepared using Dragon voice recognition software and may include unintentional dictation errors.

## 2023-10-30 NOTE — ED Triage Notes (Signed)
Pt states she has had left side pain for the past 2 days. The pain is constant and the patient describes it as feeling like someone has a sword stuck in her side. Pt states she was diagnosed years ago with crohns disease but has never had a problem with it. No diarrhea nausea or vomiting.

## 2023-10-30 NOTE — Discharge Instructions (Signed)
Please seek medical attention for any high fevers, chest pain, shortness of breath, change in behavior, persistent vomiting, bloody stool or any other new or concerning symptoms.  

## 2023-10-31 NOTE — ED Notes (Signed)
AVS provided by edp was discussed with pt. Pt verbalized understanding with no additional questions at this time. Pharmacy verified with pt. Pt to go home with s/o at bedside. Wheelchair to car. Taking home her personal cane at bedside

## 2024-02-16 ENCOUNTER — Other Ambulatory Visit: Payer: Self-pay | Admitting: Internal Medicine

## 2024-02-16 DIAGNOSIS — Z1231 Encounter for screening mammogram for malignant neoplasm of breast: Secondary | ICD-10-CM

## 2024-03-08 ENCOUNTER — Ambulatory Visit
Admission: RE | Admit: 2024-03-08 | Discharge: 2024-03-08 | Disposition: A | Discharge status: Home / Self Care | Source: Ambulatory Visit | Attending: Internal Medicine | Admitting: Internal Medicine

## 2024-03-08 DIAGNOSIS — Z1231 Encounter for screening mammogram for malignant neoplasm of breast: Secondary | ICD-10-CM | POA: Diagnosis present

## 2024-03-16 ENCOUNTER — Other Ambulatory Visit: Payer: Self-pay

## 2024-03-16 ENCOUNTER — Emergency Department

## 2024-03-16 ENCOUNTER — Emergency Department
Admission: EM | Admit: 2024-03-16 | Discharge: 2024-03-16 | Disposition: A | Discharge status: Home / Self Care | Attending: Emergency Medicine | Admitting: Emergency Medicine

## 2024-03-16 DIAGNOSIS — R42 Dizziness and giddiness: Secondary | ICD-10-CM | POA: Diagnosis present

## 2024-03-16 DIAGNOSIS — E119 Type 2 diabetes mellitus without complications: Secondary | ICD-10-CM | POA: Insufficient documentation

## 2024-03-16 DIAGNOSIS — I160 Hypertensive urgency: Secondary | ICD-10-CM | POA: Diagnosis not present

## 2024-03-16 DIAGNOSIS — I10 Essential (primary) hypertension: Secondary | ICD-10-CM | POA: Insufficient documentation

## 2024-03-16 DIAGNOSIS — R519 Headache, unspecified: Secondary | ICD-10-CM

## 2024-03-16 LAB — CBC
HCT: 37.7 % (ref 36.0–46.0)
Hemoglobin: 12.4 g/dL (ref 12.0–15.0)
MCH: 30.8 pg (ref 26.0–34.0)
MCHC: 32.9 g/dL (ref 30.0–36.0)
MCV: 93.8 fL (ref 80.0–100.0)
Platelets: 249 10*3/uL (ref 150–400)
RBC: 4.02 MIL/uL (ref 3.87–5.11)
RDW: 12.8 % (ref 11.5–15.5)
WBC: 8.2 10*3/uL (ref 4.0–10.5)
nRBC: 0 % (ref 0.0–0.2)

## 2024-03-16 LAB — BASIC METABOLIC PANEL WITH GFR
Anion gap: 4 — ABNORMAL LOW (ref 5–15)
BUN: 11 mg/dL (ref 8–23)
CO2: 26 mmol/L (ref 22–32)
Calcium: 9.3 mg/dL (ref 8.9–10.3)
Chloride: 103 mmol/L (ref 98–111)
Creatinine, Ser: 0.87 mg/dL (ref 0.44–1.00)
GFR, Estimated: >60 mL/min (ref >60)
Glucose, Bld: 184 mg/dL — ABNORMAL HIGH (ref 70–99)
Potassium: 3.6 mmol/L (ref 3.5–5.1)
Sodium: 133 mmol/L — ABNORMAL LOW (ref 135–145)

## 2024-03-16 LAB — URINALYSIS, ROUTINE W REFLEX MICROSCOPIC
Bilirubin Urine: NEGATIVE
Glucose, UA: NEGATIVE mg/dL
Hgb urine dipstick: NEGATIVE
Ketones, ur: NEGATIVE mg/dL
Leukocytes,Ua: NEGATIVE
Nitrite: NEGATIVE
Protein, ur: NEGATIVE mg/dL
Specific Gravity, Urine: 1.005 (ref 1.005–1.030)
pH: 7 (ref 5.0–8.0)

## 2024-03-16 MED ORDER — DIPHENHYDRAMINE HCL 50 MG/ML IJ SOLN
25.0000 mg | Freq: Once | INTRAMUSCULAR | Status: AC
  Administered 2024-03-16: 25 mg via INTRAVENOUS
  Filled 2024-03-16: qty 1

## 2024-03-16 MED ORDER — METOCLOPRAMIDE HCL 5 MG/ML IJ SOLN
10.0000 mg | Freq: Once | INTRAMUSCULAR | Status: AC
  Administered 2024-03-16: 10 mg via INTRAVENOUS
  Filled 2024-03-16: qty 2

## 2024-03-16 MED ORDER — ACETAMINOPHEN 325 MG PO TABS
650.0000 mg | ORAL_TABLET | Freq: Once | ORAL | Status: AC
  Administered 2024-03-16: 650 mg via ORAL
  Filled 2024-03-16: qty 2

## 2024-03-16 MED ORDER — PROCHLORPERAZINE EDISYLATE 10 MG/2ML IJ SOLN: 10.0000 mg | Freq: Once | INTRAMUSCULAR | Status: DC

## 2024-03-16 MED ORDER — KETOROLAC TROMETHAMINE 15 MG/ML IJ SOLN
15.0000 mg | Freq: Once | INTRAMUSCULAR | Status: AC
Start: 2024-03-16 — End: 2024-03-16
  Administered 2024-03-16: 15 mg via INTRAVENOUS
  Filled 2024-03-16: qty 1

## 2024-03-16 NOTE — ED Triage Notes (Signed)
 Pt comes in from home via pov with complaints of headaches for the past couple of days that keep getting worse. Patient states that she gets headaches like this often, and they come and go. Pt also complains of dizziness and is hypertensive in triage. Pt has a history of hypertension, and has taken her blood pressure medication this morning.

## 2024-03-16 NOTE — Discharge Instructions (Signed)
 You were seen in the ER for your headache.  Your testing did not show an emergency cause for this.  Follow with your primary care doctor for further evaluation.  Please also discuss your blood pressure regimen with your primary care doctor.  Return to the ER for new or worsening symptoms.

## 2024-03-16 NOTE — ED Provider Notes (Signed)
 [facility] Provider Note    Event Date/Time   First MD Initiated Contact with Patient 03/16/24 [phone]     (approximate)   History   Dizziness   HPI  Destiny Savage is a 73 year old female with history of T2DM, HTN, CVA presenting to the emergency department for evaluation of headache.  Patient reports a history of longstanding headaches, occasionally more severe.  Reports that this morning she had onset of a severe headache, but reports that it is similar in character to prior that she has had, not sudden in onset.  No associated numbness, tingling, weakness.  Reports that her headaches sometimes improve when she takes her blood pressure medicine which she did this morning, but reports persistent headache.  No chest pain, shortness of breath.     Physical Exam   Triage Vital Signs: ED Triage Vitals  Encounter Vitals Group     BP 03/16/24 0720 (!) 231/93     Systolic BP Percentile --      Diastolic BP Percentile --      Pulse Rate 03/16/24 0720 75     Resp 03/16/24 0720 19     Temp 03/16/24 0720 98.7 F (37.1 C)     Temp src --      SpO2 03/16/24 0720 99 %     Weight 03/16/24 0723 203 lb 0.7 oz (92.1 kg)     Height 03/16/24 0723 5\' 6"  (1.676 m)     Head Circumference --      Peak Flow --      Pain Score 03/16/24 0721 10     Pain Loc --      Pain Education --      Exclude from Growth Chart --     Most recent vital signs: Vitals:   03/16/24 0930 03/16/24 1100  BP: (!) 172/85 (!) 171/89  Pulse: 65 63  Resp: 13 14  Temp:    SpO2: 99% 99%     General: Awake, interactive  CV:  Regular rate, good peripheral perfusion.  Resp:  Unlabored respirations, lungs clear to auscultation Abd:  Nondistended, soft, nontender to palpation Neuro:  Alert and oriented, normal extraocular movements, symmetric facial movement, sensation intact over bilateral upper and lower extremities with 5 out of 5 strength.  Normal finger-to-nose testing.  ED Results /  Procedures / Treatments   Labs (all labs ordered are listed, but only abnormal results are displayed) Labs Reviewed  BASIC METABOLIC PANEL WITH GFR - Abnormal; Notable for the following components:      Result Value   Sodium 133 (*)    Glucose, Bld 184 (*)    Anion gap 4 (*)    All other components within normal limits  URINALYSIS, ROUTINE W REFLEX MICROSCOPIC - Abnormal; Notable for the following components:   Color, Urine STRAW (*)    APPearance HAZY (*)    All other components within normal limits  CBC  CBG MONITORING, ED     EKG EKG independently reviewed interpreted by myself (ER attending) demonstrates:  EKG demonstrates normal sinus rhythm rate of 75, PR 174, QRS 84, QTc 435, no acute ST changes  RADIOLOGY Imaging independently reviewed and interpreted by myself demonstrates:  CT head without acute bleed  Formal Radiology Read:  CT Head Wo Contrast Result Date: 03/16/2024 CLINICAL DATA:  73 year old female with progressive headache for several days. Increasing frequency of headaches with dizziness, hypertensive in triage. EXAM: CT HEAD WITHOUT CONTRAST TECHNIQUE: Contiguous axial images were  obtained from the base of the skull through the vertex without intravenous contrast. RADIATION DOSE REDUCTION: This exam was performed according to the departmental dose-optimization program which includes automated exposure control, adjustment of the mA and/or kV according to patient size and/or use of iterative reconstruction technique. COMPARISON:  Head CT 02/28/2023.  Brain MRI 02/10/2023. FINDINGS: Brain: Cerebral volume remains normal for age. No midline shift, ventriculomegaly, mass effect, evidence of mass lesion, intracranial hemorrhage or evidence of cortically based acute infarction. Gray-white matter differentiation is stable and within normal limits for age. Vascular: Calcified atherosclerosis at the skull base. No suspicious intracranial vascular hyperdensity. Skull: Stable,  intact. Sinuses/Orbits: Visualized paranasal sinuses and mastoids are clear. Other: No acute orbit or scalp soft tissue finding. IMPRESSION: Stable and normal for age noncontrast Head CT. Electronically Signed   By: Marlise Simpers M.D.   On: 03/16/2024 08:56    PROCEDURES:  Critical Care performed: No  Procedures   MEDICATIONS ORDERED IN ED: Medications  acetaminophen  (TYLENOL ) tablet 650 mg (650 mg Oral Given 03/16/24 0832)  diphenhydrAMINE  (BENADRYL ) injection 25 mg (25 mg Intravenous Given 03/16/24 0831)  metoCLOPramide  (REGLAN ) injection 10 mg (10 mg Intravenous Given 03/16/24 0832)  ketorolac  (TORADOL ) 15 MG/ML injection 15 mg (15 mg Intravenous Given 03/16/24 1111)     IMPRESSION / MDM / ASSESSMENT AND PLAN / ED COURSE  I reviewed the triage vital signs and the nursing notes.  Differential diagnosis includes, but is not limited to, consideration for hypertensive emergency presenting with intracranial bleed, other acute intracranial process, exacerbation of chronic headaches  Patient's presentation is most consistent with acute presentation with potential threat to life or bodily function.  73 year old female presenting with headache.  Hypertensive on presentation, improved to systolics in the 180s at the time of my initial evaluation.  Labs from triage are reassuring.  Given hypertension and worsened headache, will obtain CT. symptomatic treatment for headache ordered.  3:54 PM CT head reassuring.  Patient reassessed and has improvement in her headache.  Blood pressure improved without intervention.  She is comfortable discharge home.  Strict return precautions provided.  Patient was in stable condition.      FINAL CLINICAL IMPRESSION(S) / ED DIAGNOSES   Final diagnoses:  Acute nonintractable headache, unspecified headache type  Hypertensive urgency     Rx / DC Orders   ED Discharge Orders     None        Note:  This document was prepared using Dragon voice recognition  software and may include unintentional dictation errors.   Claria Crofts, MD 03/16/24 [phone]

## 2024-04-06 ENCOUNTER — Encounter (INDEPENDENT_AMBULATORY_CARE_PROVIDER_SITE_OTHER): Payer: Self-pay

## 2024-04-13 ENCOUNTER — Ambulatory Visit (INDEPENDENT_AMBULATORY_CARE_PROVIDER_SITE_OTHER): Admitting: Vascular Surgery

## 2024-04-13 ENCOUNTER — Encounter (INDEPENDENT_AMBULATORY_CARE_PROVIDER_SITE_OTHER): Payer: Self-pay | Admitting: Vascular Surgery

## 2024-04-13 VITALS — BP 158/97 | HR 76 | Resp 18 | Ht 66.0 in | Wt 203.0 lb

## 2024-04-13 DIAGNOSIS — M79605 Pain in left leg: Secondary | ICD-10-CM

## 2024-04-13 DIAGNOSIS — I6522 Occlusion and stenosis of left carotid artery: Secondary | ICD-10-CM

## 2024-04-13 DIAGNOSIS — I639 Cerebral infarction, unspecified: Secondary | ICD-10-CM | POA: Diagnosis not present

## 2024-04-13 DIAGNOSIS — M79609 Pain in unspecified limb: Secondary | ICD-10-CM | POA: Insufficient documentation

## 2024-04-13 DIAGNOSIS — E1159 Type 2 diabetes mellitus with other circulatory complications: Secondary | ICD-10-CM

## 2024-04-13 DIAGNOSIS — I10 Essential (primary) hypertension: Secondary | ICD-10-CM | POA: Diagnosis not present

## 2024-04-13 DIAGNOSIS — E785 Hyperlipidemia, unspecified: Secondary | ICD-10-CM

## 2024-04-13 DIAGNOSIS — M79604 Pain in right leg: Secondary | ICD-10-CM

## 2024-04-13 NOTE — Assessment & Plan Note (Signed)
 Likely from her carotid disease.  Does still have some residual word finding and aphasia symptoms.

## 2024-04-13 NOTE — Assessment & Plan Note (Signed)
 Had a carotid stent placed about a year ago.  This needs to be checked with a duplex in the near future at her convenience.  She is on dual antiplatelet therapy and a statin agent which is appropriate.  Follow-up after her duplex.

## 2024-04-13 NOTE — Assessment & Plan Note (Signed)
 Patient describes bilateral lower extremity pain and weakness.  This could certainly be related to neuropathy or other symptoms, but given the patient with multiple atherosclerotic risk factors it would be prudent to check for PAD as a possible cause as well.  ABIs will be done in the near future at her convenience.

## 2024-04-13 NOTE — Assessment & Plan Note (Signed)
 blood pressure control important in reducing the progression of atherosclerotic disease. On appropriate oral medications.

## 2024-04-13 NOTE — Progress Notes (Signed)
 MRN : [id number]  Destiny Savage is a 73 y.o. (06/24/1951) female who presents with chief complaint of  Chief Complaint  Patient presents with   New Patient (Initial Visit)    np. consult. evaluate for carotid stenosis from CT 4.12.25. potter, zachary.  .  History of Present Illness: Patient returns today in follow up of her carotid disease on referral from Dr. Walden Guise.  A little over a year ago, she came in with acute stroke symptoms.  She had a high-grade left carotid stenosis and was treated with left carotid stent by my partner Dr. Prescilla Brod.  She still has some aphasia and word finding issues but overall is doing fairly well.  She does complain about a lot of weakness and tiredness in her legs.  It makes it difficult for her to walk.  She has not had her carotid stent checked since her placement that she knows of.  She also has not had her lower extremity perfusion checked to her knowledge.  No open wounds or infection.  No fevers or chills.    Current Outpatient Medications  Medication Sig Dispense Refill   acetaminophen  (TYLENOL ) 500 MG tablet Take 500-1,000 mg by mouth every 8 (eight) hours as needed.     albuterol  (VENTOLIN  HFA) 108 (90 Base) MCG/ACT inhaler Inhale 1 puff into the lungs every 6 (six) hours as needed for wheezing.     aspirin  81 MG tablet Take 1 tablet (81 mg total) by mouth daily.     atorvastatin  (LIPITOR) 40 MG tablet Take 1 tablet (40 mg total) by mouth daily. 90 tablet 3   carvedilol  (COREG ) 6.25 MG tablet Take 1 tablet (6.25 mg total) by mouth 2 (two) times daily with a meal. 180 tablet 3   cholecalciferol  (VITAMIN D3) 25 MCG (1000 UNIT) tablet Take 1,000 Units by mouth daily.     clopidogrel  (PLAVIX ) 75 MG tablet Take 1 tablet (75 mg total) by mouth daily. 90 tablet 3   furosemide  (LASIX ) 20 MG tablet TAKE 1 TABLET BY MOUTH ON MONDAY, WEDNESDAY AND FRIDAY 39 tablet 3   lidocaine  (LIDODERM ) 5 % Place 1 patch onto the skin every 12 (twelve) hours. Remove & Discard  patch within 12 hours or as directed by MD 10 patch 0   metFORMIN  (GLUCOPHAGE -XR) 500 MG 24 hr tablet Take 500 mg by mouth in the morning and 1000 mg at hight.     Multiple Vitamin (MULTIVITAMIN WITH MINERALS) TABS tablet Take 1 tablet by mouth daily.     nortriptyline  (PAMELOR ) 10 MG capsule Take 10 mg by mouth 2 (two) times daily.     oxyCODONE -acetaminophen  (PERCOCET/ROXICET) 5-325 MG tablet Take 1 tablet by mouth every 8 (eight) hours as needed for severe pain.     polyethylene glycol (MIRALAX ) 17 g packet Take 17 g by mouth daily. 14 each 0   polyethylene glycol powder (GLYCOLAX /MIRALAX ) 17 GM/SCOOP powder 1 cap full in a full glass of water, two times a day for 3 days. 255 g 0   potassium chloride  (KLOR-CON ) 10 MEQ tablet TAKE 1 TABLET BY MOUTH 3 TIMES A WEEK ON MONDAYS, WEDNESDAYS, AND FRIDAYS 39 tablet 0   senna-docusate (SENOKOT-S) 8.6-50 MG tablet Take 2 tablets by mouth 2 (two) times daily. 120 tablet 0   methocarbamol  (ROBAXIN ) 500 MG tablet Take 500 mg by mouth every 8 (eight) hours as needed for muscle spasms. (Patient not taking: Reported on 05/19/2023)     No current facility-administered medications for this visit.  Past Medical History:  Diagnosis Date   Arthritis    Asthma    Carotid stenosis    a. 01/2023 s/p L carotid stenting (9x7 Exact stent).   Chest pain    a. 12/2015 MV: No ischemia/infarct. EF 55-65%.   Diabetes mellitus    Diastolic dysfunction    a. 12/2018 Echo: EF >65%, impaired relaxation; b. 01/2023 Echo: EF 65-70%, no rwma, Gr I DD, nl RV fxn, mild MR.   GERD (gastroesophageal reflux disease)    Hyperlipidemia    Hypertension    Mini stroke    Stroke [facility])     Past Surgical History:  Procedure Laterality Date   ABDOMINAL HYSTERECTOMY     BREAST EXCISIONAL BIOPSY Left 1980's   benign   CARDIAC CATHETERIZATION     CAROTID PTA/STENT INTERVENTION Left 02/12/2023   Procedure: CAROTID PTA/STENT INTERVENTION;  Surgeon: Jackquelyn Mass, MD;  Location:  [facility] INVASIVE CV LAB;  Service: Cardiovascular;  Laterality: Left;   CHOLECYSTECTOMY       Social History   Tobacco Use   Smoking status: Never   Smokeless tobacco: Never  Vaping Use   Vaping status: Never Used  Substance Use Topics   Alcohol use: No   Drug use: No       Family History  Problem Relation Age of Onset   Heart Problems Father    Hypertension Other    CAD Other    Breast cancer Neg Hx      Allergies  Allergen Reactions   Latex Hives and Rash    Other reaction(s): Other (See Comments)   Valsartan  Shortness Of Breath    Mild sob after taking first and second dose of valsartan    Lidocaine  Hives   Phenobarbital-Belladonna Alk     Other reaction(s): Hives & Rash   Phenobarbital-Belladonna Alk Hives and Rash     REVIEW OF SYSTEMS (Negative unless checked)  Constitutional: [] Weight loss  [] Fever  [] Chills Cardiac: [] Chest pain   [] Chest pressure   [] Palpitations   [] Shortness of breath when laying flat   [] Shortness of breath at rest   [] Shortness of breath with exertion. Vascular:  [x] Pain in legs with walking   [x] Pain in legs at rest   [] Pain in legs when laying flat   [] Claudication   [] Pain in feet when walking  [] Pain in feet at rest  [] Pain in feet when laying flat   [] History of DVT   [] Phlebitis   [] Swelling in legs   [] Varicose veins   [] Non-healing ulcers Pulmonary:   [] Uses home oxygen   [] Productive cough   [] Hemoptysis   [] Wheeze  [] COPD   [x] Asthma Neurologic:  [] Dizziness  [] Blackouts   [] Seizures   [x] History of stroke   [x] History of TIA  [] Aphasia   [] Temporary blindness   [] Dysphagia   [] Weakness or numbness in arms   [] Weakness or numbness in legs Musculoskeletal:  [x] Arthritis   [] Joint swelling   [x] Joint pain   [] Low back pain Hematologic:  [] Easy bruising  [] Easy bleeding   [] Hypercoagulable state   [] Anemic   Gastrointestinal:  [] Blood in stool   [] Vomiting blood  [x] Gastroesophageal reflux/heartburn   [] Abdominal pain Genitourinary:   [] Chronic kidney disease   [] Difficult urination  [] Frequent urination  [] Burning with urination   [] Hematuria Skin:  [] Rashes   [] Ulcers   [] Wounds Psychological:  [] History of anxiety   []  History of major depression.  Physical Examination    That probably is from her strokeBP (!) 158/97  Pulse 76   Resp 18   Ht 5\' 6"  (1.676 m)   Wt 203 lb (92.1 kg)   BMI 32.77 kg/m  Gen:  WD/WN, NAD Head: [place]/AT, No temporalis wasting. Ear/Nose/Throat: Hearing grossly intact, nares w/o erythema or drainage Eyes: Conjunctiva clear. Sclera non-icteric Neck: Supple.  Trachea midline Pulmonary:  Good air movement, no use of accessory muscles.  Cardiac: RRR, no JVD Vascular:  Vessel Right Left  Radial Palpable Palpable                          PT 1+ Palpable 1+ Palpable  DP 2+ Palpable 2+ Palpable   Gastrointestinal: soft, non-tender/non-distended. No guarding/reflex.  Musculoskeletal: M/S 5/5 throughout.  No deformity or atrophy. No edema. Neurologic: Sensation grossly intact in extremities.  Symmetrical.  She definitely has a little word finding difficulty and her speech is slow but she generally gets the words out eventually.  There is a left hemispheric strokes that kind of slow having to pull words almost is common Psychiatric: Judgment intact, Mood & affect appropriate for pt's clinical situation. Dermatologic: No rashes or ulcers noted.  No cellulitis or open wounds.      Labs Recent Results (from the past 2160 hours)  Basic metabolic panel     Status: Abnormal   Collection Time: 03/16/24  7:33 AM  Result Value Ref Range   Sodium 133 (L) 135 - 145 mmol/L   Potassium 3.6 3.5 - 5.1 mmol/L   Chloride 103 98 - 111 mmol/L   CO2 26 22 - 32 mmol/L   Glucose, Bld 184 (H) 70 - 99 mg/dL    Comment: Glucose reference range applies only to samples taken after fasting for at least 8 hours.   BUN 11 8 - 23 mg/dL   Creatinine, Ser 1.61 0.44 - 1.00 mg/dL   Calcium  9.3 8.9 - 10.3 mg/dL    GFR, Estimated >09 >60 mL/min    Comment: (NOTE) Calculated using the CKD-EPI Creatinine Equation (2021)    Anion gap 4 (L) 5 - 15    Comment: Performed at [facility], [address] [place], [place] [address]  CBC     Status: None   Collection Time: 03/16/24  7:33 AM  Result Value Ref Range   WBC 8.2 4.0 - 10.5 K/uL   RBC 4.02 3.87 - 5.11 MIL/uL   Hemoglobin 12.4 12.0 - 15.0 g/dL   HCT 81.1 91.4 - 78.2 %   MCV 93.8 80.0 - 100.0 fL   MCH 30.8 26.0 - 34.0 pg   MCHC 32.9 30.0 - 36.0 g/dL   RDW 95.6 21.3 - 08.6 %   Platelets 249 150 - 400 K/uL   nRBC 0.0 0.0 - 0.2 %    Comment: Performed at [facility], [address] [place], [place] [address]  Urinalysis, Routine w reflex microscopic -Urine, Clean Catch     Status: Abnormal   Collection Time: 03/16/24  8:57 AM  Result Value Ref Range   Color, Urine STRAW (A) YELLOW   APPearance HAZY (A) CLEAR   Specific Gravity, Urine 1.005 1.005 - 1.030   pH 7.0 5.0 - 8.0   Glucose, UA NEGATIVE NEGATIVE mg/dL   Hgb urine dipstick NEGATIVE NEGATIVE   Bilirubin Urine NEGATIVE NEGATIVE   Ketones, ur NEGATIVE NEGATIVE mg/dL   Protein, ur NEGATIVE NEGATIVE mg/dL   Nitrite NEGATIVE NEGATIVE   Leukocytes,Ua NEGATIVE NEGATIVE    Comment: Performed at [facility],  [address] [place], [place] [address]    Radiology CT Head Wo Contrast Result Date: 03/16/2024 CLINICAL DATA:  73 year old female with progressive headache for several days. Increasing frequency of headaches with dizziness, hypertensive in triage. EXAM: CT HEAD WITHOUT CONTRAST TECHNIQUE: Contiguous axial images were obtained from the base of the skull through the vertex without intravenous contrast. RADIATION DOSE REDUCTION: This exam was performed according to the departmental dose-optimization program which includes automated exposure control, adjustment of the mA and/or kV according to patient size and/or use of iterative reconstruction technique.  COMPARISON:  Head CT 02/28/2023.  Brain MRI 02/10/2023. FINDINGS: Brain: Cerebral volume remains normal for age. No midline shift, ventriculomegaly, mass effect, evidence of mass lesion, intracranial hemorrhage or evidence of cortically based acute infarction. Gray-white matter differentiation is stable and within normal limits for age. Vascular: Calcified atherosclerosis at the skull base. No suspicious intracranial vascular hyperdensity. Skull: Stable, intact. Sinuses/Orbits: Visualized paranasal sinuses and mastoids are clear. Other: No acute orbit or scalp soft tissue finding. IMPRESSION: Stable and normal for age noncontrast Head CT. Electronically Signed   By: Marlise Simpers M.D.   On: 03/16/2024 08:56    Assessment/Plan  Carotid stenosis Had a carotid stent placed about a year ago.  This needs to be checked with a duplex in the near future at her convenience.  She is on dual antiplatelet therapy and a statin agent which is appropriate.  Follow-up after her duplex.  Essential hypertension blood pressure control important in reducing the progression of atherosclerotic disease. On appropriate oral medications.   Stroke [facility]) Likely from her carotid disease.  Does still have some residual word finding and aphasia symptoms.  Diabetes type 2, controlled (HCC) blood glucose control important in reducing the progression of atherosclerotic disease. Also, involved in wound healing. On appropriate medications.   Dyslipidemia lipid control important in reducing the progression of atherosclerotic disease. Continue statin therapy   Pain in limb Patient describes bilateral lower extremity pain and weakness.  This could certainly be related to neuropathy or other symptoms, but given the patient with multiple atherosclerotic risk factors it would be prudent to check for PAD as a possible cause as well.  ABIs will be done in the near future at her convenience.    Mikki Alexander, MD  04/13/2024 11:10  AM    This note was created with Dragon medical transcription system.  Any errors from dictation are purely unintentional

## 2024-04-13 NOTE — Assessment & Plan Note (Signed)
 lipid control important in reducing the progression of atherosclerotic disease. Continue statin therapy

## 2024-04-13 NOTE — Assessment & Plan Note (Signed)
 blood glucose control important in reducing the progression of atherosclerotic disease. Also, involved in wound healing. On appropriate medications.

## 2024-05-11 ENCOUNTER — Ambulatory Visit (INDEPENDENT_AMBULATORY_CARE_PROVIDER_SITE_OTHER): Admitting: Vascular Surgery

## 2024-05-11 ENCOUNTER — Ambulatory Visit (INDEPENDENT_AMBULATORY_CARE_PROVIDER_SITE_OTHER)

## 2024-05-11 ENCOUNTER — Encounter (INDEPENDENT_AMBULATORY_CARE_PROVIDER_SITE_OTHER): Payer: Self-pay | Admitting: Vascular Surgery

## 2024-05-11 VITALS — BP 141/78 | HR 81 | Ht 66.0 in | Wt 203.4 lb

## 2024-05-11 DIAGNOSIS — I639 Cerebral infarction, unspecified: Secondary | ICD-10-CM | POA: Diagnosis not present

## 2024-05-11 DIAGNOSIS — I10 Essential (primary) hypertension: Secondary | ICD-10-CM | POA: Diagnosis not present

## 2024-05-11 DIAGNOSIS — E1159 Type 2 diabetes mellitus with other circulatory complications: Secondary | ICD-10-CM

## 2024-05-11 DIAGNOSIS — I6522 Occlusion and stenosis of left carotid artery: Secondary | ICD-10-CM

## 2024-05-11 DIAGNOSIS — M79605 Pain in left leg: Secondary | ICD-10-CM | POA: Diagnosis not present

## 2024-05-11 DIAGNOSIS — M79604 Pain in right leg: Secondary | ICD-10-CM

## 2024-05-11 DIAGNOSIS — E785 Hyperlipidemia, unspecified: Secondary | ICD-10-CM

## 2024-05-11 NOTE — Assessment & Plan Note (Signed)
 Her right ABI is 1.16 and her left ABI is 1.12 with triphasic waveforms and normal digital pressures consistent with no arterial insufficiency.  Arterial insufficiency is not the cause of her lower extremity symptoms.  Would favor neurogenic cause of her pain.  Defer further workup at this time.

## 2024-05-11 NOTE — Progress Notes (Signed)
 MRN : [id number]  Destiny Savage is a 73 y.o. (07/07/1951) female who presents with chief complaint of  Chief Complaint  Patient presents with   Follow-up     fu pt conv + Carotid + ABI see JD/FB   .  History of Present Illness: Patient returns today in follow up of multiple issues.  She is doing well.  She still has some pain in her lower extremities particular with activity.  This has not changed dramatically since her last visit a few weeks ago.  We performed ABIs for further evaluation of this today.  Her right ABI is 1.16 and her left ABI is 1.12 with triphasic waveforms and normal digital pressures consistent with no arterial insufficiency. She is status post left carotid stenting in the past.  No recent focal neurologic symptoms. Specifically, the patient denies amaurosis fugax, speech or swallowing difficulties, or arm or leg weakness or numbness. Carotid duplex today shows 1 to 39% right ICA stenosis.  Her left ICA stent does have some elevated velocities that would fall in the 50 to 75% range by duplex criteria.   Current Outpatient Medications  Medication Sig Dispense Refill   acetaminophen  (TYLENOL ) 500 MG tablet Take 500-1,000 mg by mouth every 8 (eight) hours as needed.     albuterol  (VENTOLIN  HFA) 108 (90 Base) MCG/ACT inhaler Inhale 1 puff into the lungs every 6 (six) hours as needed for wheezing.     aspirin  81 MG tablet Take 1 tablet (81 mg total) by mouth daily.     atorvastatin  (LIPITOR) 40 MG tablet Take 1 tablet (40 mg total) by mouth daily. 90 tablet 3   carvedilol  (COREG ) 6.25 MG tablet Take 1 tablet (6.25 mg total) by mouth 2 (two) times daily with a meal. 180 tablet 3   cholecalciferol  (VITAMIN D3) 25 MCG (1000 UNIT) tablet Take 1,000 Units by mouth daily.     clopidogrel  (PLAVIX ) 75 MG tablet Take 1 tablet (75 mg total) by mouth daily. 90 tablet 3   furosemide  (LASIX ) 20 MG tablet TAKE 1 TABLET BY MOUTH ON MONDAY, WEDNESDAY AND FRIDAY 39 tablet 3   lidocaine   (LIDODERM ) 5 % Place 1 patch onto the skin every 12 (twelve) hours. Remove & Discard patch within 12 hours or as directed by MD 10 patch 0   metFORMIN  (GLUCOPHAGE -XR) 500 MG 24 hr tablet Take 500 mg by mouth in the morning and 1000 mg at hight.     Multiple Vitamin (MULTIVITAMIN WITH MINERALS) TABS tablet Take 1 tablet by mouth daily.     oxyCODONE -acetaminophen  (PERCOCET/ROXICET) 5-325 MG tablet Take 1 tablet by mouth every 8 (eight) hours as needed for severe pain.     polyethylene glycol (MIRALAX ) 17 g packet Take 17 g by mouth daily. 14 each 0   polyethylene glycol powder (GLYCOLAX /MIRALAX ) 17 GM/SCOOP powder 1 cap full in a full glass of water, two times a day for 3 days. 255 g 0   potassium chloride  (KLOR-CON ) 10 MEQ tablet TAKE 1 TABLET BY MOUTH 3 TIMES A WEEK ON MONDAYS, WEDNESDAYS, AND FRIDAYS 39 tablet 0   senna-docusate (SENOKOT-S) 8.6-50 MG tablet Take 2 tablets by mouth 2 (two) times daily. 120 tablet 0   methocarbamol  (ROBAXIN ) 500 MG tablet Take 500 mg by mouth every 8 (eight) hours as needed for muscle spasms. (Patient not taking: Reported on 05/11/2024)     nortriptyline  (PAMELOR ) 10 MG capsule Take 10 mg by mouth 2 (two) times daily. (Patient not taking: Reported  on 05/11/2024)     No current facility-administered medications for this visit.    Past Medical History:  Diagnosis Date   Arthritis    Asthma    Carotid stenosis    a. 01/2023 s/p L carotid stenting (9x7 Exact stent).   Chest pain    a. 12/2015 MV: No ischemia/infarct. EF 55-65%.   Diabetes mellitus    Diastolic dysfunction    a. 12/2018 Echo: EF >65%, impaired relaxation; b. 01/2023 Echo: EF 65-70%, no rwma, Gr I DD, nl RV fxn, mild MR.   GERD (gastroesophageal reflux disease)    Hyperlipidemia    Hypertension    Mini stroke    Stroke [facility])     Past Surgical History:  Procedure Laterality Date   ABDOMINAL HYSTERECTOMY     BREAST EXCISIONAL BIOPSY Left 1980's   benign   CARDIAC CATHETERIZATION     CAROTID  PTA/STENT INTERVENTION Left 02/12/2023   Procedure: CAROTID PTA/STENT INTERVENTION;  Surgeon: Jama Cordella MATSU, MD;  Location: [facility] INVASIVE CV LAB;  Service: Cardiovascular;  Laterality: Left;   CHOLECYSTECTOMY       Social History   Tobacco Use   Smoking status: Never   Smokeless tobacco: Never  Vaping Use   Vaping status: Never Used  Substance Use Topics   Alcohol use: No   Drug use: No       Family History  Problem Relation Age of Onset   Heart Problems Father    Hypertension Other    CAD Other    Breast cancer Neg Hx      Allergies  Allergen Reactions   Latex Hives and Rash    Other reaction(s): Other (See Comments)   Valsartan  Shortness Of Breath    Mild sob after taking first and second dose of valsartan    Lidocaine  Hives   Phenobarbital-Belladonna Alk     Other reaction(s): Hives & Rash   Phenobarbital-Belladonna Alk Hives and Rash     REVIEW OF SYSTEMS (Negative unless checked)   Constitutional: [] Weight loss  [] Fever  [] Chills Cardiac: [] Chest pain   [] Chest pressure   [] Palpitations   [] Shortness of breath when laying flat   [] Shortness of breath at rest   [] Shortness of breath with exertion. Vascular:  [x] Pain in legs with walking   [x] Pain in legs at rest   [] Pain in legs when laying flat   [] Claudication   [] Pain in feet when walking  [] Pain in feet at rest  [] Pain in feet when laying flat   [] History of DVT   [] Phlebitis   [] Swelling in legs   [] Varicose veins   [] Non-healing ulcers Pulmonary:   [] Uses home oxygen   [] Productive cough   [] Hemoptysis   [] Wheeze  [] COPD   [x] Asthma Neurologic:  [] Dizziness  [] Blackouts   [] Seizures   [x] History of stroke   [x] History of TIA  [] Aphasia   [] Temporary blindness   [] Dysphagia   [] Weakness or numbness in arms   [] Weakness or numbness in legs Musculoskeletal:  [x] Arthritis   [] Joint swelling   [x] Joint pain   [] Low back pain Hematologic:  [] Easy bruising  [] Easy bleeding   [] Hypercoagulable state   [] Anemic    Gastrointestinal:  [] Blood in stool   [] Vomiting blood  [x] Gastroesophageal reflux/heartburn   [] Abdominal pain Genitourinary:  [] Chronic kidney disease   [] Difficult urination  [] Frequent urination  [] Burning with urination   [] Hematuria Skin:  [] Rashes   [] Ulcers   [] Wounds Psychological:  [] History of anxiety   []  History of major  depression.  Physical Examination  BP (!) 141/78   Pulse 81   Ht 5' 6 (1.676 m)   Wt 203 lb 6.4 oz (92.3 kg)   BMI 32.83 kg/m  Gen:  WD/WN, NAD Head: [place]/AT, No temporalis wasting. Ear/Nose/Throat: Hearing grossly intact, nares w/o erythema or drainage Eyes: Conjunctiva clear. Sclera non-icteric Neck: Supple.  Trachea midline Pulmonary:  Good air movement, no use of accessory muscles.  Cardiac: RRR, no JVD Vascular:  Vessel Right Left  Radial Palpable Palpable                          PT Palpable Palpable  DP Palpable Palpable    Musculoskeletal: M/S 5/5 throughout.  No deformity or atrophy. Trace LE edema. Neurologic: Sensation grossly intact in extremities.  Symmetrical.  Speech is fluent.  Psychiatric: Judgment intact, Mood & affect appropriate for pt's clinical situation. Dermatologic: No rashes or ulcers noted.  No cellulitis or open wounds.      Labs Recent Results (from the past 2160 hours)  Basic metabolic panel     Status: Abnormal   Collection Time: 03/16/24  7:33 AM  Result Value Ref Range   Sodium 133 (L) 135 - 145 mmol/L   Potassium 3.6 3.5 - 5.1 mmol/L   Chloride 103 98 - 111 mmol/L   CO2 26 22 - 32 mmol/L   Glucose, Bld 184 (H) 70 - 99 mg/dL    Comment: Glucose reference range applies only to samples taken after fasting for at least 8 hours.   BUN 11 8 - 23 mg/dL   Creatinine, Ser 9.12 0.44 - 1.00 mg/dL   Calcium  9.3 8.9 - 10.3 mg/dL   GFR, Estimated >39 >39 mL/min    Comment: (NOTE) Calculated using the CKD-EPI Creatinine Equation (2021)    Anion gap 4 (L) 5 - 15    Comment: Performed at [facility], [address] [place], [place] [address]  CBC     Status: None   Collection Time: 03/16/24  7:33 AM  Result Value Ref Range   WBC 8.2 4.0 - 10.5 K/uL   RBC 4.02 3.87 - 5.11 MIL/uL   Hemoglobin 12.4 12.0 - 15.0 g/dL   HCT 62.2 63.9 - 53.9 %   MCV 93.8 80.0 - 100.0 fL   MCH 30.8 26.0 - 34.0 pg   MCHC 32.9 30.0 - 36.0 g/dL   RDW 87.1 88.4 - 84.4 %   Platelets 249 150 - 400 K/uL   nRBC 0.0 0.0 - 0.2 %    Comment: Performed at [facility], [address] [place], [place] [address]  Urinalysis, Routine w reflex microscopic -Urine, Clean Catch     Status: Abnormal   Collection Time: 03/16/24  8:57 AM  Result Value Ref Range   Color, Urine STRAW (A) YELLOW   APPearance HAZY (A) CLEAR   Specific Gravity, Urine 1.005 1.005 - 1.030   pH 7.0 5.0 - 8.0   Glucose, UA NEGATIVE NEGATIVE mg/dL   Hgb urine dipstick NEGATIVE NEGATIVE   Bilirubin Urine NEGATIVE NEGATIVE   Ketones, ur NEGATIVE NEGATIVE mg/dL   Protein, ur NEGATIVE NEGATIVE mg/dL   Nitrite NEGATIVE NEGATIVE   Leukocytes,Ua NEGATIVE NEGATIVE    Comment: Performed at [facility], [address] [place], [place] [address]  VAS US  ABI WITH/WO TBI     Status: None   Collection Time: 05/11/24  1:36 PM  Result Value Ref Range   Right  ABI 1.16    Left ABI 1.12     Radiology VAS US  CAROTID Result Date: 05/12/2024 Carotid Arterial Duplex Study Patient Name:  Destiny Savage  Date of Exam:   05/11/2024 Medical Rec #: [id number]        Accession #:    [id number] Date of Birth: 1951/01/09        Patient Gender: F Patient Age:   70 years Exam Location:  [facility] Procedure:      VAS US  CAROTID Referring Phys: SELINDA GU --------------------------------------------------------------------------------  Indications:  Carotid artery disease and Left stent. Risk Factors: Hypertension, hyperlipidemia, no history of smoking, prior CVA. Performing Technologist: Donnice Charnley RVT  Examination Guidelines: A  complete evaluation includes B-mode imaging, spectral Doppler, color Doppler, and power Doppler as needed of all accessible portions of each vessel. Bilateral testing is considered an integral part of a complete examination. Limited examinations for reoccurring indications may be performed as noted.  Right Carotid Findings: +----------+--------+--------+--------+----------------------+--------+           PSV cm/sEDV cm/sStenosisPlaque Description    Comments +----------+--------+--------+--------+----------------------+--------+ CCA Prox  84      17                                             +----------+--------+--------+--------+----------------------+--------+ CCA Mid   73      17      <50%    calcific and irregular         +----------+--------+--------+--------+----------------------+--------+ CCA Distal88      18                                             +----------+--------+--------+--------+----------------------+--------+ ICA Prox  82      17      1-39%   calcific and irregular         +----------+--------+--------+--------+----------------------+--------+ ICA Mid   82      18                                             +----------+--------+--------+--------+----------------------+--------+ ICA Distal92      27                                             +----------+--------+--------+--------+----------------------+--------+ ECA       110     0               calcific and irregular         +----------+--------+--------+--------+----------------------+--------+ +----------+--------+-------+----------------+-------------------+           PSV cm/sEDV cmsDescribe        Arm Pressure (mmHG) +----------+--------+-------+----------------+-------------------+ [id number]      0      Multiphasic, WNL                    +----------+--------+-------+----------------+-------------------+ +---------+--------+--+--------+-+---------+ VertebralPSV  cm/s43EDV cm/s7Antegrade +---------+--------+--+--------+-+---------+  Left Carotid Findings: +----------+--------+--------+--------+------------------+-------------+           PSV cm/sEDV cm/sStenosisPlaque DescriptionComments      +----------+--------+--------+--------+------------------+-------------+ CCA Prox  84      19                                              +----------+--------+--------+--------+------------------+-------------+ CCA Mid   60      18                                              +----------+--------+--------+--------+------------------+-------------+ CCA Distal204     31      <50%                      stent         +----------+--------+--------+--------+------------------+-------------+ ICA Prox  168     34      1-39%                     stent         +----------+--------+--------+--------+------------------+-------------+ ICA Mid   141     27                                              +----------+--------+--------+--------+------------------+-------------+ ICA Distal69      24                                              +----------+--------+--------+--------+------------------+-------------+ ECA       221                                       through stent +----------+--------+--------+--------+------------------+-------------+ +----------+--------+--------+----------------+-------------------+           PSV cm/sEDV cm/sDescribe        Arm Pressure (mmHG) +----------+--------+--------+----------------+-------------------+ [id number]             Multiphasic, WNL                    +----------+--------+--------+----------------+-------------------+ +---------+--------+--+--------+--+---------+ VertebralPSV cm/s47EDV cm/s10Antegrade +---------+--------+--+--------+--+---------+  Left Stent(s): +---------------+--------+--------+---------------+--------+--------+ CCA to ICA     PSV cm/sEDV cm/sStenosis        WaveformComments +---------------+--------+--------+---------------+--------+--------+ Prox to Stent  72      18                                      +---------------+--------+--------+---------------+--------+--------+ Proximal Stent 67      17                                      +---------------+--------+--------+---------------+--------+--------+ Mid Stent      194     31      50-75% stenosis                 +---------------+--------+--------+---------------+--------+--------+ Distal Stent   151     27                                      +---------------+--------+--------+---------------+--------+--------+  Distal to Stent122     30                                      +---------------+--------+--------+---------------+--------+--------+    Summary: Right Carotid: Velocities in the right ICA are consistent with a 1-39% stenosis.                Non-hemodynamically significant plaque <50% noted in the CCA. Left Carotid: Velocities in the left ICA are consistent with a 1-39% stenosis.               Hemodynamically significant plaque >50% visualized in the CCA. The               ECA appears >50% stenosed. Vertebrals:  Bilateral vertebral arteries demonstrate antegrade flow. Subclavians: Normal flow hemodynamics were seen in bilateral subclavian              arteries. *See table(s) above for measurements and observations.  Electronically signed by Selinda Gu MD on 05/12/2024 at 8:12:55 AM.    Final    VAS US  ABI WITH/WO TBI Result Date: 05/12/2024  LOWER EXTREMITY DOPPLER STUDY Patient Name:  Destiny Savage  Date of Exam:   05/11/2024 Medical Rec #: [id number]        Accession #:    [id number] Date of Birth: 11/01/1951        Patient Gender: F Patient Age:   41 years Exam Location:  [facility] Procedure:      VAS US  ABI WITH/WO TBI Referring Phys: SELINDA Ege Muckey --------------------------------------------------------------------------------  High Risk Factors:  Hypertension, hyperlipidemia, Diabetes, no history of                    smoking, prior MI, prior CVA.  Performing Technologist: Donnice Charnley RVT  Examination Guidelines: A complete evaluation includes at minimum, Doppler waveform signals and systolic blood pressure reading at the level of bilateral brachial, anterior tibial, and posterior tibial arteries, when vessel segments are accessible. Bilateral testing is considered an integral part of a complete examination. Photoelectric Plethysmograph (PPG) waveforms and toe systolic pressure readings are included as required and additional duplex testing as needed. Limited examinations for reoccurring indications may be performed as noted.  ABI Findings: +---------+------------------+-----+---------+--------+ Right    Rt Pressure (mmHg)IndexWaveform Comment  +---------+------------------+-----+---------+--------+ Brachial 138                                      +---------+------------------+-----+---------+--------+ PTA      163               1.16 triphasic         +---------+------------------+-----+---------+--------+ DP       155               1.10 triphasic         +---------+------------------+-----+---------+--------+ Great Toe119               0.84                   +---------+------------------+-----+---------+--------+ +---------+------------------+-----+---------+-------+ Left     Lt Pressure (mmHg)IndexWaveform Comment +---------+------------------+-----+---------+-------+ Brachial 141                                     +---------+------------------+-----+---------+-------+  PTA      158               1.12 triphasic        +---------+------------------+-----+---------+-------+ DP       155               1.10 triphasic        +---------+------------------+-----+---------+-------+ Great Toe106               0.75                  +---------+------------------+-----+---------+-------+  +-------+-----------+-----------+------------+------------+ ABI/TBIToday's ABIToday's TBIPrevious ABIPrevious TBI +-------+-----------+-----------+------------+------------+ Right  1.16       0.84                                +-------+-----------+-----------+------------+------------+ Left   1.12       0.75                                +-------+-----------+-----------+------------+------------+   Summary: Right: Resting right ankle-brachial index is within normal range. The right toe-brachial index is normal. Left: Resting left ankle-brachial index is within normal range. The left toe-brachial index is normal. *See table(s) above for measurements and observations.  Electronically signed by Selinda Gu MD on 05/12/2024 at 8:10:43 AM.    Final     Assessment/Plan  Pain in limb Her right ABI is 1.16 and her left ABI is 1.12 with triphasic waveforms and normal digital pressures consistent with no arterial insufficiency.  Arterial insufficiency is not the cause of her lower extremity symptoms.  Would favor neurogenic cause of her pain.  Defer further workup at this time.  Carotid stenosis Carotid duplex today shows 1 to 39% right ICA stenosis.  Her left ICA stent does have some elevated velocities that would fall in the 50 to 75% range by duplex criteria.  At this point, I would continue aspirin , Plavix , and Lipitor.  Recheck in 6 months with carotid duplex.  Essential hypertension blood pressure control important in reducing the progression of atherosclerotic disease. On appropriate oral medications.     Stroke [facility]) Likely from her carotid disease.  Does still have some residual word finding and aphasia symptoms.   Diabetes type 2, controlled (HCC) blood glucose control important in reducing the progression of atherosclerotic disease. Also, involved in wound healing. On appropriate medications.     Dyslipidemia lipid control important in reducing the progression of  atherosclerotic disease. Continue statin therapy  Selinda Gu, MD  05/14/2024 4:22 PM    This note was created with Dragon medical transcription system.  Any errors from dictation are purely unintentional

## 2024-05-11 NOTE — Assessment & Plan Note (Signed)
 Carotid duplex today shows 1 to 39% right ICA stenosis.  Her left ICA stent does have some elevated velocities that would fall in the 50 to 75% range by duplex criteria.  At this point, I would continue aspirin , Plavix , and Lipitor.  Recheck in 6 months with carotid duplex.

## 2024-05-12 LAB — VAS US ABI WITH/WO TBI
Left ABI: 1.12
Right ABI: 1.16

## 2024-05-24 ENCOUNTER — Ambulatory Visit: Admitting: Podiatry

## 2024-05-24 ENCOUNTER — Encounter: Payer: Self-pay | Admitting: Podiatry

## 2024-05-24 VITALS — Ht 66.0 in | Wt 203.4 lb

## 2024-05-24 DIAGNOSIS — B351 Tinea unguium: Secondary | ICD-10-CM | POA: Diagnosis not present

## 2024-05-24 DIAGNOSIS — E1159 Type 2 diabetes mellitus with other circulatory complications: Secondary | ICD-10-CM | POA: Diagnosis not present

## 2024-05-24 DIAGNOSIS — M79674 Pain in right toe(s): Secondary | ICD-10-CM

## 2024-05-24 DIAGNOSIS — M79675 Pain in left toe(s): Secondary | ICD-10-CM | POA: Diagnosis not present

## 2024-05-24 NOTE — Progress Notes (Signed)
  Subjective:  Patient ID: Destiny Savage, female    DOB: 1951/06/28,  MRN: [id number]  Chief Complaint  Patient presents with   Nail Problem    RM 6 patient is here for right index toe nail overgrowth( nail is long and curving over, possible ingrown). Patient states no pain from toe nail.    73 y.o. female presents with the above complaint. History confirmed with patient.   Objective:  Physical Exam: warm, good capillary refill, no trophic changes or ulcerative lesions, normal DP and PT pulses, normal sensory exam, and onychomycosis of multiple toenails the worst of which is the right second toenail which is a rams horn type nail  Assessment:   1. Onychomycosis   2. Controlled type 2 diabetes mellitus with other circulatory complication, without long-term current use of insulin  (HCC)      Plan:  Patient was evaluated and treated and all questions answered.   Discussed the etiology and treatment options for the condition in detail with the patient. Recommended debridement of the affected toenail today. Sharp and mechanical debridement performed of all painful and mycotic nails today. Nails debrided in length and thickness using a nail nipper to level of comfort. Follow up as needed for painful nails.  We discussed that if not improving we could permanently remove the right second toenail with matricectomy.  She will notify me if she needs to return for this.    Return if symptoms worsen or fail to improve.

## 2024-05-29 ENCOUNTER — Other Ambulatory Visit: Payer: Self-pay

## 2024-05-29 ENCOUNTER — Emergency Department
Admission: EM | Admit: 2024-05-29 | Discharge: 2024-05-29 | Disposition: A | Discharge status: Home / Self Care | Attending: Emergency Medicine | Admitting: Emergency Medicine

## 2024-05-29 ENCOUNTER — Emergency Department

## 2024-05-29 DIAGNOSIS — I10 Essential (primary) hypertension: Secondary | ICD-10-CM | POA: Diagnosis not present

## 2024-05-29 DIAGNOSIS — E119 Type 2 diabetes mellitus without complications: Secondary | ICD-10-CM | POA: Diagnosis not present

## 2024-05-29 DIAGNOSIS — R1032 Left lower quadrant pain: Secondary | ICD-10-CM | POA: Insufficient documentation

## 2024-05-29 DIAGNOSIS — K5901 Slow transit constipation: Secondary | ICD-10-CM | POA: Diagnosis not present

## 2024-05-29 LAB — COMPREHENSIVE METABOLIC PANEL WITH GFR
ALT: 12 U/L (ref 0–44)
AST: 20 U/L (ref 15–41)
Albumin: 4 g/dL (ref 3.5–5.0)
Alkaline Phosphatase: 70 U/L (ref 38–126)
Anion gap: 9 (ref 5–15)
BUN: 16 mg/dL (ref 8–23)
CO2: 26 mmol/L (ref 22–32)
Calcium: 9.9 mg/dL (ref 8.9–10.3)
Chloride: 102 mmol/L (ref 98–111)
Creatinine, Ser: 0.72 mg/dL (ref 0.44–1.00)
GFR, Estimated: >60 mL/min (ref >60)
Glucose, Bld: 182 mg/dL — ABNORMAL HIGH (ref 70–99)
Potassium: 3.9 mmol/L (ref 3.5–5.1)
Sodium: 137 mmol/L (ref 135–145)
Total Bilirubin: 0.5 mg/dL (ref 0.0–1.2)
Total Protein: 7.3 g/dL (ref 6.5–8.1)

## 2024-05-29 LAB — CBC
HCT: 38.1 % (ref 36.0–46.0)
Hemoglobin: 12.2 g/dL (ref 12.0–15.0)
MCH: 30.8 pg (ref 26.0–34.0)
MCHC: 32 g/dL (ref 30.0–36.0)
MCV: 96.2 fL (ref 80.0–100.0)
Platelets: 247 K/uL (ref 150–400)
RBC: 3.96 MIL/uL (ref 3.87–5.11)
RDW: 12.7 % (ref 11.5–15.5)
WBC: 9.1 K/uL (ref 4.0–10.5)
nRBC: 0 % (ref 0.0–0.2)

## 2024-05-29 LAB — URINALYSIS, ROUTINE W REFLEX MICROSCOPIC
Bilirubin Urine: NEGATIVE
Glucose, UA: NEGATIVE mg/dL
Hgb urine dipstick: NEGATIVE
Ketones, ur: NEGATIVE mg/dL
Leukocytes,Ua: NEGATIVE
Nitrite: NEGATIVE
Protein, ur: NEGATIVE mg/dL
Specific Gravity, Urine: 1.017 (ref 1.005–1.030)
pH: 5 (ref 5.0–8.0)

## 2024-05-29 LAB — LIPASE, BLOOD: Lipase: 83 U/L — ABNORMAL HIGH (ref 11–51)

## 2024-05-29 NOTE — Discharge Instructions (Addendum)
 Your exam, labs, and x-ray are normal and reassuring overall.  The x-ray does confirm a significant stool burden consistent with constipation.  This likely is related to your history of IBS-C.  No indication of any acute infection or other lab abnormalities.  You should increase your fiber intake by increasing fruits and veggies.  You should also consider taking MiraLAX  2-3 times daily until you have a meaningful stool, and then consider doing it daily to maintain soft stools.  Follow-up with your primary provider or return to the ED if needed.

## 2024-05-29 NOTE — ED Provider Notes (Signed)
 [facility] Emergency Department Provider Note     Event Date/Time   First MD Initiated Contact with Patient 05/29/24 1516     (approximate)   History   Abdominal Pain   HPI  Destiny Savage is a 73 y.o. female with a history of HTN, DM type II, arthritis, GERD, IBS-C, HLD, and stroke, presents to the ED endorsing left lower quadrant abdominal discomfort.  Patient endorsed onset of symptoms last night.  She notes her last bowel movement was probably a couple days ago.  Typical stools for her are large in volume.  She denies any bright red blood per rectum, nausea, vomiting, or fecal incontinence.  She also denies any fevers, chills, or sweats.  This left lower quadrant pain is similar to her previous presentations.  She typically will take MiraLAX  stool softener if symptoms become problematic, but does not take anything daily.  Chart review reveals the patient has been seen on previous occasions for similar LLQ abdominal pain.  Work labs have been largely unremarkable, with the exception of moderate to severe colonic stool burden shown on prior CTs.  Patient reports similar pain and discomfort today, prompting her report to the ED.   Physical Exam   Triage Vital Signs: ED Triage Vitals  Encounter Vitals Group     BP 05/29/24 1456 (!) 154/90     Girls Systolic BP Percentile --      Girls Diastolic BP Percentile --      Boys Systolic BP Percentile --      Boys Diastolic BP Percentile --      Pulse Rate 05/29/24 1456 77     Resp 05/29/24 1456 20     Temp 05/29/24 1456 97.9 F (36.6 C)     Temp Source 05/29/24 1456 Oral     SpO2 05/29/24 1456 98 %     Weight 05/29/24 1457 203 lb 6.4 oz (92.3 kg)     Height 05/29/24 1457 5' 6 (1.676 m)     Head Circumference --      Peak Flow --      Pain Score 05/29/24 1457 8     Pain Loc --      Pain Education --      Exclude from Growth Chart --     Most recent vital signs: Vitals:   05/29/24 1456 05/29/24  1627  BP: (!) 154/90 (!) 183/77  Pulse: 77 71  Resp: 20 16  Temp: 97.9 F (36.6 C) 98 F (36.7 C)  SpO2: 98% 100%    General Awake, no distress. NAD HEENT NCAT. PERRL. EOMI. No rhinorrhea. Mucous membranes are moist.  CV:  Good peripheral perfusion. RRR RESP:  Normal effort. CTA ABD:  No distention.  Soft and nontender.  Normal bowel sounds x 4.  No rebound, guarding, or rigidity appreciated.  No organomegaly noted.  No CVA tenderness elicited.  Nontender to palpation to the lower abdominal quadrants.   ED Results / Procedures / Treatments   Labs (all labs ordered are listed, but only abnormal results are displayed) Labs Reviewed  LIPASE, BLOOD - Abnormal; Notable for the following components:      Result Value   Lipase 83 (*)    All other components within normal limits  COMPREHENSIVE METABOLIC PANEL WITH GFR - Abnormal; Notable for the following components:   Glucose, Bld 182 (*)    All other components within normal limits  URINALYSIS, ROUTINE W REFLEX MICROSCOPIC - Abnormal; Notable for  the following components:   Color, Urine YELLOW (*)    APPearance HAZY (*)    All other components within normal limits  CBC    EKG   RADIOLOGY  I personally viewed and evaluated these images as part of my medical decision making, as well as reviewing the written report by the radiologist.  ED Provider Interpretation: Large stool burden throughout the entire colon.  No SBO or fecal impaction  DG Abdomen 1 View Result Date: 05/29/2024 CLINICAL DATA:  Left lower quadrant pain with history of constipation. EXAM: ABDOMEN - 1 VIEW COMPARISON:  Abdominal series 05/27/2022 FINDINGS: The bowel gas pattern is normal. There is a large amount of stool throughout the entire colon. There surgical clips in the right upper quadrant. No radio-opaque calculi or other significant radiographic abnormality are seen. IMPRESSION: Large amount of stool throughout the entire colon. Electronically Signed    By: Greig Pique M.D.   On: 05/29/2024 16:27     PROCEDURES:  Critical Care performed: No  Procedures   MEDICATIONS ORDERED IN ED: Medications - No data to display   IMPRESSION / MDM / ASSESSMENT AND PLAN / ED COURSE  I reviewed the triage vital signs and the nursing notes.                              Differential diagnosis includes, but is not limited to, acute appendicitis, diverticulitis, urinary tract infection/pyelonephritis, bowel obstruction, colitis, renal colic, gastroenteritis, hernia, constipation, etc.  Patient's presentation is most consistent with acute complicated illness / injury requiring diagnostic workup.  Patient's diagnosis is consistent with LLQ abdominal discomfort, likely due to chronic constipation.  Patient with IBS-C, presents to the ED endorsing less than 24 hours of LLQ abdominal discomfort.  No associated fevers chills, sweats.  No nausea, vomiting, or bowel changes noted.  Patient reports symptoms similar to previous presentations.  Lab workup is normal and reassuring with no acute lab abnormalities.  No evidence of any acute leukocytosis or critical anemia.  Patient with a mildly elevated but stable lipase.  Plain films evaluated by me of the abdomen, reveal a moderate to severe colonic stool burden without evidence of bowel obstruction or fecal impaction.  Patient's exam is benign without any evidence of acute abdominal process.  She is nontoxic in appearance with no evidence of dehydration or sepsis.  Symptoms likely due to patient's IBS-C.  She does not regularly take stool softeners.  We discussed treatment options including increasing fiber intake in her diet.  The option also of more regular stool softener dosage to promote and keep stools regular was discussed as well.  Patient will be discharged home with instructions to take OTC MiraLAX  once to twice daily as needed. Patient is to follow up with her PCP or GI provider as discussed, as needed or  otherwise directed. Patient is given ED precautions to return to the ED for any worsening or new symptoms.   FINAL CLINICAL IMPRESSION(S) / ED DIAGNOSES   Final diagnoses:  Left lower quadrant abdominal pain  Slow transit constipation     Rx / DC Orders   ED Discharge Orders     None        Note:  This document was prepared using Dragon voice recognition software and may include unintentional dictation errors.    Loyd Candida LULLA Aldona, PA-C 05/29/24 RONOLD    Malvina Alm DASEN, MD 05/29/24 [phone]

## 2024-05-29 NOTE — ED Triage Notes (Signed)
 Pt to ED via POV from home. Pt reports left sided abd pain that started yesterday and has gotten worse. Denies N/V/D and HA. Pt with hx of IBS.

## 2024-05-29 NOTE — ED Notes (Signed)
 Pt verbalizes understanding of discharge instructions.

## 2024-05-29 NOTE — ED Notes (Signed)
 Pt reports abdominal pain for 3 or 4 days now. Pt denies any burning sensation with urination. Pt talks in complete sentences no respiratory distress noted

## 2024-06-01 NOTE — Progress Notes (Signed)
 Today the history is gathered from: 50% - patient  50% - patients daughters today in clinic  RECORDS SUMMARY: Patient is self-referred for evaluation of headaches.  REFERRING PHYSICIAN: Corlis Honor BROCKS, MD PRIMARY CARE PHYSICIAN:  Corlis Honor  IMPRESSION/PLAN  Destiny Savage is a 73 y.o. female presenting for evaluation of  HEADACHES/ PHOTOPHOBIA/ PHONOPHOBIA/ DIFFICULTY SLEEPING/ IMBALANCE/ MEMORY LOSS -Ongoing. -Patient with ongoing headaches, occur every other day in bitemporal region. Taking OTC Tylenol  which is somewhat effective. Not taking Nortriptyline . Daughters are concerned for patient's short term memory loss. Patient repeats words and conversations and misplaces items. Becomes anxious and frustrated with memory loss. Following with Vein and Vascular Surgery for management of carotid stenosis. -Memory evaluation today is 22/30. Repeat in six months.  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  -Start Aricept  5 mg at night for memory loss.  -Start Nortriptyline  (Pamelor ) 10 mg nightly for one week, then increase to 20 mg nightly for headaches.  -Recommend that patient discontinue Tizanidine for headaches.  -Continue to follow with Vein and Vascular Surgery for management for carotid artery stenosis.    Medications previously tried:  Follow-up with Dr. Lane in 2-3 months, sooner if needed.  p=4  CHIEF COMPLAINT & HPI  Destiny Savage is a 73 y.o. female presenting for evaluation of: Chief Complaint  Patient presents with  . HEADACHES/ PHOTOPHOBIA/ PHONOPHOBIA  . DIFFICULTY SLEEPING/ IMBALANCE/ MEMORY LOSS    HEADACHES/ PHOTOPHOBIA/ PHONOPHOBIA/ DIFFICULTY SLEEPING/ IMBALANCE/ MEMORY LOSS Patient is reporting with ongoing headaches. Headaches come and go. Headaches occur around every other day. Pain is located in bitemporal region. Taking OTC Tylenol  which is somewhat effective. Photophobia, phonophobia with headaches. Describes pain as a throbbing pain. No nausea,  vomiting, changes in vision, dizziness or changes in speech with headaches. Patient is not taking Nortriptyline . Daughters are concerned for patient's short term memory loss. Daughters endorse that patient repeats words and conversations. Sometimes misplaces items. No difficulty with recognizing names and faces. Patient lives with husband. Patient drives, does not get lost. No difficulties with ADLs. No difficultiy with using complex tools and devices. No hallucinations. Patient manages finances and medications. Mood is ok, becomes anxious and frustrated with memory loss. Memory evaluation today is 22/30. Following with Vein and Vascular Surgery for management of carotid stenosis. Recommend to continue Aspirin , Plavix  and Lipitor.   DATA SUMMARY: 05/11/2024 VAS US  CAROTID Summary:  Right Carotid: Velocities in the right ICA are consistent with a 1-39% stenosis.                 Non-hemodynamically significant plaque <50% noted in the CCA.   Left Carotid: Velocities in the left ICA are consistent with a 1-39% stenosis.                Hemodynamically significant plaque >50% visualized in the CCA. The                ECA appears >50% stenosed.   Vertebrals:  Bilateral vertebral arteries demonstrate antegrade flow.  Subclavians: Normal flow hemodynamics were seen in bilateral subclavian               arteries.   03/16/2024 CT HEAD WO CONTRAST IMPRESSION:  Stable and normal for age noncontrast Head CT.   02/28/2023 CT ANGIO HEAD NECK W WO IMPRESSION:  1. Negative CTA for large vessel occlusion or other emergent  finding.  2. Interval stenting across the left carotid bulb. Patent flow  through the stent without visible intra stent thrombus  or other  complication. Narrowing at the mid aspect of the stent with residual  50% stenosis, previously 70% on 02/10/2023.  3. Atheromatous plaque about the right carotid bulb/proximal right  ICA with associated stenosis of up to 50% by NASCET criteria,   stable.  4. Fetal type origin of the PCAs with overall diminutive  vertebrobasilar system.  5. No other acute intracranial abnormality   02/10/2023 MR BRAIN WO IMPRESSION:  Normal MRI of the brain.   02/10/2023 CT ANGIO HEAD W WO CT ANGIO NECK W WO CT CERVICAL PERFUSION W IMPRESSION:  1. No emergent large vessel occlusion or high-grade stenosis of the  intracranial arteries.  2. Approximately 70% stenosis of the proximal left internal carotid  artery secondary to calcified atherosclerosis. Less than 50%  stenosis on the right.  3. Normal CT perfusion.   02/10/2023 CT HEAD CODE STROKE WO IMPRESSION:  No acute intracranial hemorrhage or evidence of evolving large  vessel territory infarct. ASPECT score is 10.   01/31/2023 MRI LUMBAR SPINE WO IMPRESSION:  At L4-L5, a right subarticular disc extrusion compresses the  traversing right L5 nerve root in the lateral recess.   05/18/2022 CT HEAD WO IMPRESSION:  No acute intracranial abnormality.   11/04/2019 CT ANGIO HEAD W WO  IMPRESSION:  Head CT: Negative for age.  Intracranial CT angiography: Normal. No stenosis, aneurysm, vascular  malformation or identifiable vessel occlusion.   07/28/2019 MRI BRAIN WO IMPRESSION:  Normal aging brain.   07/05/2019 EEG Impression:  This awake and asleep EEG is normal.     01/08/2019 CT ANGIO NECK W WO IMPRESSION:  1. No hemodynamically significant stenosis ICA's. Moderate stenosis  distal LEFT common carotid artery.  2. Patent vertebral arteries. Moderate stenosis LEFT vertebral  artery origin.   01/08/2019 MRI CERVICAL SPINE WO IMPRESSION:  Marked degenerative endplate signal change at C4-5 accounts for  signal abnormality seen on brain MRI yesterday and is new since the  patient's 2013 cervical spine MRI.   Some progression of spondylosis at C3-4, C4-5 and C5-6. Moderate to  moderately severe foraminal narrowing at each of these levels is  worse on the left. Mild central  canal narrowing is present at C3-4  and C4-5.   Mild deformity of the ventral cord at C6-7 and mild bilateral  foraminal narrowing are unchanged.   01/07/2019 MRI BRAIN WO IMPRESSION:  MRI HEAD:  1. No acute intracranial process; negative noncontrast MRI head for  age.  2. Abnormal signal C4 vertebral body may be artifact; consider non  emergent CT versus MRI cervical spine.   MRA HEAD:  1. No emergent large vessel occlusion or flow-limiting stenosis.   01/07/2019 US  CAROTID BILATERAL IMPRESSION:  Color duplex indicates moderate heterogeneous and calcified plaque,  with no hemodynamically significant stenosis by duplex criteria in  the extracranial cerebrovascular circulation.   01/07/2019 CT HEAD WO IMPRESSION:  Negative head CT   01/07/2019 MR MRA HEAD/BRAIN WO IMPRESSION:  MRI HEAD:  1. No acute intracranial process; negative noncontrast MRI head for  age.  2. Abnormal signal C4 vertebral body may be artifact; consider non  emergent CT versus MRI cervical spine.   MRA HEAD:  1. No emergent large vessel occlusion or flow-limiting stenosis.   12/16/2017 MRI BRAIN WO IMPRESSION:  Normal noncontrast MRI of the head for age.   11/24/2017 CT HEAD WO IMPRESSION:  No acute intracranial abnormality.   01/11/2017 MR LUMBAR SPINE WO IMPRESSION:  Mild degenerative disc disease L3-4, L4-5 and L5-S1 as  described  above without central canal or foraminal stenosis.   01/11/2017 US  CAROTID BILATERAL IMPRESSION:  1. Mild (1-49%) stenosis proximal right internal carotid artery  secondary to heterogeneous and partially calcified atherosclerotic  plaque.  2. Mild (1-49%) stenosis proximal left internal carotid artery  secondary to heterogenous atherosclerotic plaque.  3. Vertebral arteries are patent with normal antegrade flow.  Signed,   01/11/2017 MR MRA HEAD/BRAIN WO IMPRESSION:  1. No significant proximal stenosis, aneurysm, or branch vessel  occlusion.  2. Mild  medium and distal small vessel disease.  3. Fetal type posterior cerebral arteries bilaterally.   01/10/2017 MRI BRAIN WO IMPRESSION:  1. Faint tiny focus of diffusion hyperintensity in left posterior  limb of internal capsule, possibly representing a tiny acute/early  subacute infarction.  2. Otherwise no acute intracranial abnormality.  3. Mild paranasal sinus disease.  These results were called by telephone at the time of interpretation  on 01/10/2017 at 7:38 pm to Dr. BELVIE ESSEX , who verbally  acknowledged these results.   01/10/2017 CT HEAD WO IMPRESSION:  No evidence of acute intracranial abnormality.   07/16/2014 CT HEAD LIMITED WO IMPRESSION:  No acute abnormality is noted.   05/11/2012 MR ANGIOGRAM NECK W WO IMPRESSION:  No evidence for carotid or vertebral dissection. Nonstenotic plaque  both carotid bifurcations.   MR BRAIN W WO IMPRESSION:  Negative. No intracranial stenosis or aneurysm.   MR ANGIOGRAM HEAD WO IMPRESSION:  No acute intracranial findings.   04/19/2012 MR CERVICAL SPINE WO IMPRESSION:  1. Mild central and bilateral foraminal narrowing at C3-4.  2.  Minimal uncovertebral spurring at C4-5 without significant  stenosis.  3. Mild foraminal narrowing bilaterally at C5-6 is worse on the  left.  4. Mild left central and left foraminal stenosis at C6-7.  5.  Mild bilateral foraminal narrowing at C7-T1.   04/07/2012 CT CERVICAL SPINE WO IMPRESSION:  No evidence of fracture-dislocation. Torticollis cannot be excluded.   04/07/2012 CT HEAD LIMITED WO IMPRESSION:       No acute abnormality.   03/08/2009 US  CAROTID BILATERAL IMPRESSION:  No sonographic evidence of hemodynamically significant stenosis within the  right or left carotid systems.    03/08/2012 MR BRAIN LTD WO IMPRESSION:  1. No MR evidence of focal or acute abnormalitites as described above.   03/08/2012 CT HEAD LIMITED WO IMPRESSION:  1. No evidence of acute  intracranial abnormalitites.  2. Comparison was made to prior study dated 12/11/2011.  3. Dr. Waddell of the emergency department was informed of these findings  via a preliminary faxed report.   12/11/2011 CT HEAD LIMITED WO IMPRESSION:  No acute intracranial process.    VISIT SUMMARIES:   MEDICATIONS Current Outpatient Medications  Medication Sig Dispense Refill  . acetaminophen  (TYLENOL ) 500 MG tablet Take 1,000 mg by mouth    . albuterol  90 mcg/actuation inhaler Inhale 1 inhalation into the lungs every 6 (six) hours as needed    . aspirin  81 MG EC tablet Take 81 mg by mouth once daily    . atorvastatin  (LIPITOR) 40 MG tablet Take 1 tablet (40 mg total) by mouth at bedtime    . CALCIUM  ORAL Take by mouth    . carvediloL  (COREG ) 6.25 MG tablet Take 6.25 mg by mouth 2 (two) times daily with meals    . cholecalciferol  (VITAMIN D3) 1000 unit tablet Take 1 tablet (1,000 Units total) by mouth once daily    . clopidogrel  (PLAVIX ) 75 mg tablet Take 1 tablet (75 mg  total) by mouth    . CONSTULOSE  10 gram/15 mL oral solution TAKE 30 MLS (20 GRAMS TOTAL) BY MOUTH DAILY AS NEEDED FOR CONSTIPATION    . FUROsemide  (LASIX ) 20 MG tablet Take on Monday, Wednesday and Friday    . hydrochlorothiazide (HYDRODIURIL) 25 MG tablet Take 1 tablet (25 mg total) by mouth once daily as needed    . HYDROcodone -acetaminophen  (NORCO) 5-325 mg tablet Take 1-65 tablets by mouth every 6 (six) hours as needed    . ketoconazole (NIZORAL) 2 % cream     . metFORMIN  (GLUCOPHAGE ) 500 MG tablet Take 500 mg by mouth 500 mg in the morning and 1000 at night    . methocarbamoL  (ROBAXIN ) 500 MG tablet Take 500 mg by mouth every 8 (eight) hours as needed    . polyethylene glycol (MIRALAX ) powder Take 17 g by mouth once daily as needed    . potassium chloride  (KLOR-CON ) 10 MEQ ER tablet Take by mouth TAKE 1 TABLET BY MOUTH 3 TIMES A WEEK ON MONDAYS, WEDNESDAYS, AND FRIDAYS    . simvastatin  (ZOCOR ) 40 MG tablet Take 1 tablet (40 mg  total) by mouth at bedtime    . aspirin -calcium  carbonate 81 mg-300 mg calcium (777 mg) Tab Take by mouth (Patient not taking: Reported on 01/27/2024)    . CARVEDILOL  (COREG  ORAL) Take 1 tablet by mouth 2 (two) times daily. (Patient not taking: Reported on 06/01/2024)    . diazePAM  (VALIUM ) 5 MG tablet TAKE ONE TABLET BY MOUTH 30 MINUTES PRIOR TO MRI. (Patient not taking: Reported on 01/27/2024)    . donepeziL  (ARICEPT ) 5 MG tablet Take 1 tablet (5 mg total) by mouth at bedtime 30 tablet 2  . meloxicam  (MOBIC ) 7.5 MG tablet Take 7.5 mg by mouth once daily as needed for Pain (Patient not taking: Reported on 06/01/2024)    . nortriptyline  (PAMELOR ) 10 MG capsule Take 10 mg by mouth at bedtime (Patient not taking: Reported on 06/01/2024)    . nortriptyline  (PAMELOR ) 10 MG capsule TAKE 1 CAPSULE BY MOUTH NIGHTLY FOR 7 DAYS, THEN INCREASE TO 2 CAPSULES NIGHTLY FOR HEADACHE (Patient not taking: Reported on 06/01/2024) 60 capsule 0  . nortriptyline  (PAMELOR ) 10 MG capsule Start Nortriptyline  (Pamelor ) 10 mg nightly for one week, then increase to 20 mg nightly 60 capsule 3  . pantoprazole  (PROTONIX ) 40 MG DR tablet Take 40 mg by mouth once daily (Patient not taking: Reported on 06/01/2024)    . saxagliptin (ONGLYZA) 5 mg tablet Take 1 tablet (5 mg total) by mouth once daily (Patient not taking: Reported on 06/01/2024)    . valsartan  (DIOVAN ) 80 MG tablet Take 80 mg by mouth once daily (Patient not taking: Reported on 03/31/2024)     No current facility-administered medications for this visit.    ALLERGIES Allergies  Allergen Reactions  . Donnatal [Phenobarb-Hyoscy-Atropine -Scop] Hives and Rash  . Latex Rash and Other (See Comments)  . Lidocaine  Hives     EXAM   Vitals:   06/01/24 0952  BP: 130/64  Weight: 92.4 kg (203 lb 9.6 oz)  Height: 167.6 cm (5' 6)  PainSc:   8  PainLoc: Leg     Body mass index is 32.86 kg/m.  MEMORY EVALUATION 06/01/2024 - 22/30  GENERAL: Pleasant female. NAD.   Normocephalic and atraumatic. Ambulating with cane in clinic.   MUSCULOSKELETAL: Bulk - Normal Tone - Normal Pronator Drift - Absent bilaterally. Ambulation - Gait and station are not assessed today. Romberg - not assessed today.  R/L  5/5    Shoulder abduction (deltoid/supraspinatus, axillary/suprascapular n, C5) 5/5    Elbow flexion (biceps brachii, musculoskeletal n, C5-6) 5/5    Elbow extension (triceps, radial n, C7) 5/5    Finger adduction (interossei, ulnar n, T1)  5/5    Hip flexion (iliopsoas, L1/L2) 5/5    Knee flexion (hamstrings, sciatic n, L5/S1)  5/5    Knee extension (quadriceps, femoral n, L3/4) 5/5    Ankle dorsiflexion (tibialis anterior, deep fibular n, L4/5) 5/5    Ankle plantarflexion (gastroc, tibial n, S1)   NEUROLOGICAL: MENTAL STATUS: Patient is oriented to person, place and time.  Recent memory is not assessed today.  Remote memory is intact.  Attention span and concentration are intact.  Naming, repetition, comprehension and expressive speech are within normal limits.  Patient's fund of knowledge is within normal limits for educational level.  CRANIAL NERVES: Normal    CN II (normal visual acuity and visual fields) Normal    CN III, IV, VI (extraocular muscles are intact) Normal    CN V (facial sensation is intact bilaterally) Normal    CN VII (facial strength is intact bilaterally) Normal    CN VIII (hearing is intact bilaterally) Normal    CN IX/X (palate elevates midline, normal phonation) Normal    CN XI (shoulder shrug strength is normal and symmetric) Normal    CN XII (tongue protrudes midline)  SENSATION: Intact to pain and temp bilaterally (spinothalamic tracts) Intact to position and vibration bilaterally (dorsal columns)  REFLEXES: R/L 2+/2+    Biceps 2+/2+    Brachioradialis  2+/2+    Patellar 2+/2+    Achilles  COORDINATION/CEREBELLAR: Finger to nose testing is within normal limits.     PAST MEDICAL HISTORY Past Medical  History:  Diagnosis Date  . Asthma without status asthmaticus (HHS-HCC)   . CVA (cerebral vascular accident) (CMS/HHS-HCC) 02/10/2023  . Diabetes mellitus type 2, uncomplicated (CMS/HHS-HCC)   . GERD (gastroesophageal reflux disease)   . Hyperlipidemia   . Hypertension   . NSTEMI (non-ST elevated myocardial infarction) (CMS/HHS-HCC) 02/27/2023  . Sleep apnea     PAST SURGICAL HISTORY Past Surgical History:  Procedure Laterality Date  . COLONOSCOPY W/BIOPSY  04/14/2015   Procedure: COLONOSCOPY, FLEXIBLE; WITH BIOPSY, SINGLE OR MULTIPLE;  Surgeon: Toribio Jason Clas, MD;  Location: [facility] ENDO[facility];  Service: Gastroenterology;;  . CAROTID PTA/STENT INTERVENTION  02/12/2023  . CHOLECYSTECTOMY    . cyst on wrist removed Left   . HYSTERECTOMY      FAMILY HISTORY Family History  Problem Relation Name Age of Onset  . High blood pressure (Hypertension) Mother    . High blood pressure (Hypertension) Father    . Diabetes type II Father      SOCIAL HISTORY  Social History   Tobacco Use  . Smoking status: Never  . Smokeless tobacco: Never  Substance Use Topics  . Alcohol use: No  . Drug use: No     REVIEW OF SYSTEMS:  13 system ROS form was given to the patient to complete and I have reviewed it.  The form was sent for scan to the patient's EHR.  Pertinent positives and negatives are mentioned above in the HPI and all other systems are negative.   DATA  I have personally reviewed all of the data outlined below both prior to the appointment and during the appointment with the patient as appropriate.  No visits with results within 6 Month(s) from this visit.  Latest known visit with results  is:  Office Visit on 03/01/2022  Component Date Value Ref Range Status  . Sodium 03/01/2022 138  135 - 145 mmol/L Final  . Potassium 03/01/2022 4.4  3.5 - 5.0 mmol/L Final  . Chloride 03/01/2022 101  98 - 108 mmol/L Final  . Carbon Dioxide (CO2) 03/01/2022 28  21 - 30 mmol/L Final  .  Urea Nitrogen (BUN) 03/01/2022 6 (L)  7 - 20 mg/dL Final  . Creatinine 95/85/7976 0.8  0.4 - 1.0 mg/dL Final  . Glucose 95/85/7976 128  70 - 140 mg/dL Final  . Calcium  03/01/2022 9.4  8.7 - 10.2 mg/dL Final  . AST (Aspartate Aminotransferase) 03/01/2022 21  15 - 41 U/L Final  . ALT (Alanine Aminotransferase) 03/01/2022 14  14 - 54 U/L Final  . Bilirubin, Total 03/01/2022 0.6  0.4 - 1.5 mg/dL Final  . Alk Phos (Alkaline Phosphatase) 03/01/2022 60  24 - 110 U/L Final  . Albumin 03/01/2022 4.1  3.5 - 4.8 g/dL Final  . Protein, Total 03/01/2022 6.7  6.2 - 8.1 g/dL Final  . Anion Gap 95/85/7976 9  3 - 12 mmol/L Final  . BUN/CREA Ratio 03/01/2022 8  6 - 27 Final  . Glomerular Filtration Rate (eGFR)  03/01/2022 79  mL/min/1.73sq m Final  . WBC (White Blood Cell Count) 03/01/2022 6.9  3.2 - 9.8 x10^9/L Final  . Hemoglobin 03/01/2022 11.7 (L)  12.0 - 15.5 g/dL Final  . Hematocrit 95/85/7976 38.9  35.0 - 45.0 % Final  . Platelets 03/01/2022 273  150 - 450 x10^9/L Final  . MCV (Mean Corpuscular Volume) 03/01/2022 97  80 - 98 fL Final  . MCH (Mean Corpuscular Hemoglobin) 03/01/2022 29.2  26.5 - 34.0 pg Final  . MCHC (Mean Corpuscular Hemoglobin * 03/01/2022 30.1 (L)  31.4 - 36.0 % Final  . RBC (Red Blood Cell Count) 03/01/2022 4.01  3.77 - 5.16 x10^12/L Final  . RDW-CV (Red Cell Distribution Widt* 03/01/2022 13.2  11.5 - 14.5 % Final  . NRBC (Nucleated Red Blood Cell Cou* 03/01/2022 0.00  0 x10^9/L Final  . NRBC % (Nucleated Red Blood Cell %) 03/01/2022 0.0  % Final  . MPV (Mean Platelet Volume) 03/01/2022 10.7  7.2 - 11.7 fL Final  . Neutrophil Count 03/01/2022 4.1  2.0 - 8.6 x10^9/L Final  . Neutrophil % 03/01/2022 59.9  37 - 80 % Final  . Lymphocyte Count 03/01/2022 2.0  0.6 - 4.2 x10^9/L Final  . Lymphocyte % 03/01/2022 28.6  10 - 50 % Final  . Monocyte Count 03/01/2022 0.4  0 - 0.9 x10^9/L Final  . Monocyte % 03/01/2022 6.1  0 - 12 % Final  . Eosinophil Count 03/01/2022 0.29  0 - 0.70 x10^9/L  Final  . Eosinophil % 03/01/2022 4.2  0 - 7 % Final  . Basophil Count 03/01/2022 0.06  0 - 0.20 x10^9/L Final  . Basophil % 03/01/2022 0.9  0 - 2 % Final  . Immature Granulocyte Count 03/01/2022 0.02  <=0.06 x10^9/L Final  . Immature Granulocyte % 03/01/2022 0.3  <=0.7 % Final  . Amylase 03/01/2022 111  31 - 119 U/L Final  . Lipase 03/01/2022 83 (H)  17 - 51 U/L Final    No follow-ups on file.  Payor: BCBS MEDICARE ADVANTAGE PLAN / Plan: BCBS MEDICARE ADVANTAGE OOS / Product Type: Medicare /   This note is partially written by Roe Mater, in the presence of and acting as the scribe of Dr. Arthea Farrow.    I  have reviewed, edited and added to the note as needed to reflect my best personal medical judgment.    Dr. Arthea Farrow, MD [facility] [place], [place] Ph:  [phone] Fax:  [phone]

## 2024-07-18 ENCOUNTER — Other Ambulatory Visit: Payer: Self-pay

## 2024-07-18 ENCOUNTER — Emergency Department

## 2024-07-18 ENCOUNTER — Emergency Department
Admission: EM | Admit: 2024-07-18 | Discharge: 2024-07-18 | Disposition: A | Discharge status: Home / Self Care | Attending: Emergency Medicine | Admitting: Emergency Medicine

## 2024-07-18 DIAGNOSIS — R519 Headache, unspecified: Secondary | ICD-10-CM | POA: Insufficient documentation

## 2024-07-18 DIAGNOSIS — I10 Essential (primary) hypertension: Secondary | ICD-10-CM | POA: Insufficient documentation

## 2024-07-18 DIAGNOSIS — F0390 Unspecified dementia without behavioral disturbance: Secondary | ICD-10-CM | POA: Insufficient documentation

## 2024-07-18 DIAGNOSIS — H538 Other visual disturbances: Secondary | ICD-10-CM | POA: Insufficient documentation

## 2024-07-18 DIAGNOSIS — R531 Weakness: Secondary | ICD-10-CM | POA: Diagnosis not present

## 2024-07-18 DIAGNOSIS — E119 Type 2 diabetes mellitus without complications: Secondary | ICD-10-CM | POA: Insufficient documentation

## 2024-07-18 DIAGNOSIS — M791 Myalgia, unspecified site: Secondary | ICD-10-CM | POA: Insufficient documentation

## 2024-07-18 DIAGNOSIS — R109 Unspecified abdominal pain: Secondary | ICD-10-CM | POA: Insufficient documentation

## 2024-07-18 DIAGNOSIS — R52 Pain, unspecified: Secondary | ICD-10-CM

## 2024-07-18 LAB — URINE DRUG SCREEN, QUALITATIVE (ARMC ONLY)
Amphetamines, Ur Screen: NOT DETECTED
Barbiturates, Ur Screen: NOT DETECTED
Benzodiazepine, Ur Scrn: NOT DETECTED
Cannabinoid 50 Ng, Ur ~~LOC~~: NOT DETECTED
Cocaine Metabolite,Ur ~~LOC~~: NOT DETECTED
MDMA (Ecstasy)Ur Screen: NOT DETECTED
Methadone Scn, Ur: NOT DETECTED
Opiate, Ur Screen: NOT DETECTED
Phencyclidine (PCP) Ur S: NOT DETECTED
Tricyclic, Ur Screen: NOT DETECTED

## 2024-07-18 LAB — TSH: TSH: 5.185 u[IU]/mL — ABNORMAL HIGH (ref 0.350–4.500)

## 2024-07-18 LAB — CBC WITH DIFFERENTIAL/PLATELET
Abs Immature Granulocytes: 0.05 K/uL (ref 0.00–0.07)
Basophils Absolute: 0.1 K/uL (ref 0.0–0.1)
Basophils Relative: 1 %
Eosinophils Absolute: 0.4 K/uL (ref 0.0–0.5)
Eosinophils Relative: 4 %
HCT: 36.4 % (ref 36.0–46.0)
Hemoglobin: 11.8 g/dL — ABNORMAL LOW (ref 12.0–15.0)
Immature Granulocytes: 1 %
Lymphocytes Relative: 31 %
Lymphs Abs: 3.1 K/uL (ref 0.7–4.0)
MCH: 30.9 pg (ref 26.0–34.0)
MCHC: 32.4 g/dL (ref 30.0–36.0)
MCV: 95.3 fL (ref 80.0–100.0)
Monocytes Absolute: 0.6 K/uL (ref 0.1–1.0)
Monocytes Relative: 6 %
Neutro Abs: 5.7 K/uL (ref 1.7–7.7)
Neutrophils Relative %: 57 %
Platelets: 240 K/uL (ref 150–400)
RBC: 3.82 MIL/uL — ABNORMAL LOW (ref 3.87–5.11)
RDW: 12.6 % (ref 11.5–15.5)
WBC: 9.8 K/uL (ref 4.0–10.5)
nRBC: 0 % (ref 0.0–0.2)

## 2024-07-18 LAB — COMPREHENSIVE METABOLIC PANEL WITH GFR
ALT: 11 U/L (ref 0–44)
AST: 23 U/L (ref 15–41)
Albumin: 3.9 g/dL (ref 3.5–5.0)
Alkaline Phosphatase: 92 U/L (ref 38–126)
Anion gap: 12 (ref 5–15)
BUN: 17 mg/dL (ref 8–23)
CO2: 24 mmol/L (ref 22–32)
Calcium: 9.5 mg/dL (ref 8.9–10.3)
Chloride: 102 mmol/L (ref 98–111)
Creatinine, Ser: 0.98 mg/dL (ref 0.44–1.00)
GFR, Estimated: >60 mL/min (ref >60)
Glucose, Bld: 227 mg/dL — ABNORMAL HIGH (ref 70–99)
Potassium: 3.9 mmol/L (ref 3.5–5.1)
Sodium: 138 mmol/L (ref 135–145)
Total Bilirubin: 0.3 mg/dL (ref 0.0–1.2)
Total Protein: 6.7 g/dL (ref 6.5–8.1)

## 2024-07-18 LAB — APTT: aPTT: 36 s (ref 24–36)

## 2024-07-18 LAB — PROTIME-INR
INR: 0.9 (ref 0.8–1.2)
Prothrombin Time: 12.6 s (ref 11.4–15.2)

## 2024-07-18 LAB — ETHANOL: Alcohol, Ethyl (B): <15 mg/dL (ref <15)

## 2024-07-18 LAB — TROPONIN I (HIGH SENSITIVITY)
Troponin I (High Sensitivity): 7 ng/L (ref <18)
Troponin I (High Sensitivity): 7 ng/L (ref <18)

## 2024-07-18 LAB — CK: Total CK: 88 U/L (ref 38–234)

## 2024-07-18 LAB — T4, FREE: Free T4: 0.7 ng/dL (ref 0.61–1.12)

## 2024-07-18 MED ORDER — SODIUM CHLORIDE 0.9 % IV BOLUS
1000.0000 mL | Freq: Once | INTRAVENOUS | Status: AC
  Administered 2024-07-18: 1000 mL via INTRAVENOUS

## 2024-07-18 MED ORDER — LORAZEPAM 0.5 MG PO TABS
0.5000 mg | ORAL_TABLET | Freq: Once | ORAL | Status: AC
  Administered 2024-07-18: 0.5 mg via ORAL
  Filled 2024-07-18: qty 1

## 2024-07-18 MED ORDER — DIPHENHYDRAMINE HCL 50 MG/ML IJ SOLN
12.5000 mg | Freq: Once | INTRAMUSCULAR | Status: AC
  Administered 2024-07-18: 12.5 mg via INTRAVENOUS
  Filled 2024-07-18: qty 1

## 2024-07-18 MED ORDER — PROCHLORPERAZINE EDISYLATE 10 MG/2ML IJ SOLN
10.0000 mg | Freq: Once | INTRAMUSCULAR | Status: AC
  Administered 2024-07-18: 10 mg via INTRAVENOUS
  Filled 2024-07-18: qty 2

## 2024-07-18 MED ORDER — IOHEXOL 350 MG/ML SOLN
100.0000 mL | Freq: Once | INTRAVENOUS | Status: AC | PRN
  Administered 2024-07-18: 100 mL via INTRAVENOUS

## 2024-07-18 NOTE — ED Triage Notes (Signed)
 Pt c/o headache that started this morning and her right toes didn't feel right, and the right foot was hurting at 3 am. Upper and lower extremities strong bilaterally. CMS intact. Pedal pulse 2+. Pt AOX4, NAD noted.

## 2024-07-18 NOTE — ED Notes (Signed)
 Pt returned from MRI. Pt asking to use restroom, one person assist required.

## 2024-07-18 NOTE — ED Notes (Signed)
 Pt states bilateral weakness in legs with some soreness to thighs. Pt denies any CP/SOB, but stating is having some blurry vision. Pt does take BP meds but has not taken them this morning. Pt describes HA to front of head.

## 2024-07-18 NOTE — ED Notes (Signed)
Pt up to use restroom, one person assist required.

## 2024-07-18 NOTE — ED Notes (Signed)
 Pt taken to CT.

## 2024-07-18 NOTE — Discharge Instructions (Signed)
 Please be sure to follow-up with your neurologist for further management of your migraines.  Also discussed with the primary care doctor for further management of your muscle aching in her thighs.

## 2024-07-18 NOTE — ED Provider Notes (Signed)
 Destiny Savage Provider Note    Event Date/Time   First MD Initiated Contact with Patient 07/18/24 0715     (approximate)   History   Headache   HPI  Destiny Savage is a 73 y.o. female with history of diabetes, GERD, hyperlipidemia, hypertension, prior history of stroke, presenting with headache, aching to her bilateral thighs as well as her right distal toes.  States that headache has been ongoing for about 2 days, does have history of headaches, but feels that this is more intense and lasting longer than usual.  States that the character of the headache is frontal, states that she has not had these headaches in the past.  Not thunderclap, not sudden onset or worst headache of her life.  States that she has some associated blurry vision.  No double vision.  Also has right lower extremity weakness that has been ongoing for the last 3 to 4 days.  She denies any fevers or chills, no urinary symptoms, no saddle anesthesia, no nausea vomiting or diarrhea, no chest pain back pain or abdominal pain.  No sensory deficits.  Per independent history from husband, she has not taken her blood pressure medications this morning.  States that her speech is at baseline, no recent trauma or falls.  States that for her prior stroke she has some twitching at her lips but she has no residual deficits.  On independent review, she was seen by neurology in July, has symptoms that are consistent with mild dementia, started Aricept  5 mg at night for memory loss, she is on nortriptyline  for headaches.  She was recommended to stop tizanidine for headaches.     Physical Exam   Triage Vital Signs: ED Triage Vitals  Encounter Vitals Group     BP 07/18/24 0711 (!) 215/94     Girls Systolic BP Percentile --      Girls Diastolic BP Percentile --      Boys Systolic BP Percentile --      Boys Diastolic BP Percentile --      Pulse Rate 07/18/24 0711 75     Resp 07/18/24 0711 18     Temp  07/18/24 0711 98.4 F (36.9 C)     Temp Source 07/18/24 0711 Oral     SpO2 07/18/24 0711 99 %     Weight --      Height 07/18/24 0709 5' 6 (1.676 m)     Head Circumference --      Peak Flow --      Pain Score 07/18/24 0709 10     Pain Loc --      Pain Education --      Exclude from Growth Chart --     Most recent vital signs: Vitals:   07/18/24 0730 07/18/24 0900  BP: (!) 163/77 (!) 156/81  Pulse: 72 71  Resp: 13 19  Temp:    SpO2: 100% 100%     General: Awake, no distress.  CV:  Good peripheral perfusion.  Resp:  Normal effort.  No tachypnea or respiratory distress Abd:  No distention.  Soft nontender Other:  Pupils equal and reactive, extraocular movements are intact, no cranial nerve deficits, no slurred speech, she has very mild weakness to the right lower extremity compared to the left, no upper extremity weakness.  She has equal DP pulses bilaterally, extremities are warm and well-perfused, she has no lesions to her extremities, no open wounds, no lower extremity edema unilateral swelling  or tenderness, she notes mild tenderness to palpation to her distal toes, cap refills intact to her toes without evidence of erythema, ecchymoses, discoloration, swelling.  No bony tenderness to extremities.  No midline spinal tenderness, no saddle anesthesia.   ED Results / Procedures / Treatments   Labs (all labs ordered are listed, but only abnormal results are displayed) Labs Reviewed  CBC WITH DIFFERENTIAL/PLATELET - Abnormal; Notable for the following components:      Result Value   RBC 3.82 (*)    Hemoglobin 11.8 (*)    All other components within normal limits  COMPREHENSIVE METABOLIC PANEL WITH GFR - Abnormal; Notable for the following components:   Glucose, Bld 227 (*)    All other components within normal limits  TSH - Abnormal; Notable for the following components:   TSH 5.185 (*)    All other components within normal limits  ETHANOL  PROTIME-INR  URINE DRUG  SCREEN, QUALITATIVE ([facility] ONLY)  APTT  CK  T4, FREE  TROPONIN I (HIGH SENSITIVITY)  TROPONIN I (HIGH SENSITIVITY)     EKG  EKG shows, sinus rhythm, rate of 73, normal QS, normal QTc, no obvious ischemic ST elevation, T wave flattening in V3, not significant change compared to prior   RADIOLOGY On my independent interpretation, CT head without obvious intracranial hemorrhage   PROCEDURES:  Critical Care performed: No  Procedures   MEDICATIONS ORDERED IN ED: Medications  sodium chloride  0.9 % bolus 1,000 mL (0 mLs Intravenous Stopped 07/18/24 1026)  prochlorperazine  (COMPAZINE ) injection 10 mg (10 mg Intravenous Given 07/18/24 0754)  diphenhydrAMINE  (BENADRYL ) injection 12.5 mg (12.5 mg Intravenous Given 07/18/24 0754)  iohexol  (OMNIPAQUE ) 350 MG/ML injection 100 mL (100 mLs Intravenous Contrast Given 07/18/24 0809)  LORazepam  (ATIVAN ) tablet 0.5 mg (0.5 mg Oral Given 07/18/24 0917)     IMPRESSION / MDM / ASSESSMENT AND PLAN / ED COURSE  I reviewed the triage vital signs and the nursing notes.                              Differential diagnosis includes, but is not limited to, complex migraine, CVA, TIA, did consider aorta etiology given her headache and thigh aching and aching to her right toes.  Also consider electrolyte derangement, dehydration, musculoskeletal pain, strain, arthritis, tension headache, headache is not thunderclap, not worst headache of her life, and has had headaches like this in the past but slightly more intense, doubt subarachnoid or intracranial hemorrhage.  No stroke alert was activated given that her symptoms started 3 to 4 days ago.  And she is outside of tPA window.  For the very mild likeliness also did consider that she was having some anterior thigh aching that could have contributed to the weakness.  No posterior lower extremity swelling or tenderness.  Will get labs, EKG, troponin, CT angio head and neck, CT dissection protocol.  Will eventually get  an MRI.  Will give her migraine cocktail with some fluids.  Patient's presentation is most consistent with acute presentation with potential threat to life or bodily function.  Independent interpretation of labs and imaging below.  On reassessment patient is doing significantly better, states that the headache and vision changes have resolved, on exam her lower extremity strength appears to be symmetrical.  Discussed with patient and family about imaging and lab results.  They know to follow-up with vascular surgery for further management of her carotid stents and in-stent stenosis, also noted  follow-up with her neurologist for further management of her migraines.  Considered but no indication for inpatient mission at this time, she safe for outpatient management.  Will discharge with strict return precautions.  Shared decision making done with patient and family and they are agreeable with this plan.  The patient is on the cardiac monitor to evaluate for evidence of arrhythmia and/or significant heart rate changes.   Clinical Course as of 07/18/24 1126  Sun Jul 18, 2024  0806 Independent review of labs, PT/INR and APTT are normal, troponin is not elevated, electrolytes not severely deranged, LFTs are normal, no leukocytosis. [TT]  0940 CT Angio Chest/Abd/Pel for Dissection W and/or W/WO IMPRESSION: 1. Negative for aortic aneurysm or dissection. Minimal Aortic atherosclerosis, although advanced CT appearance of Coronary Artery Atherosclerosis.  2. No acute or inflammatory process identified in the chest, abdomen, or pelvis. Large bowel redundancy and retained stool similar to CT Abdomen and Pelvis last year. Right greater than left hip joint degeneration.   [TT]  0940 CT Angio Head Neck W WO CM IMPRESSION: 1. Negative for large vessel occlusion, and stable CTA Head and Neck since 02/28/2023: - patent cervical Left carotid stent with suspicion of chronic in-stent stenosis. Non-emergent  follow-up conventional carotid artery angiogram may be valuable. - Right ICA origin atherosclerosis with 50% stenosis. - diminutive Vertebrobasilar system which appears to be on the basis of fetal bilateral PCA origins. - minimal intracranial atherosclerosis.  2. Stable and negative for age CT appearance of the brain   [TT]  1009 Her TSH is mildly elevated, free T4 is normal, CK is normal, UDS is negative.  Troponin x 2 is negative. [TT]  1053 MR BRAIN WO CONTRAST No evidence of acute intracranial abnormality.  [TT]    Clinical Course User Index [TT] Waymond, Lorelle Cummins, MD     FINAL CLINICAL IMPRESSION(S) / ED DIAGNOSES   Final diagnoses:  Nonintractable headache, unspecified chronicity pattern, unspecified headache type  Weakness  Aching     Rx / DC Orders   ED Discharge Orders     None        Note:  This document was prepared using Dragon voice recognition software and may include unintentional dictation errors.    Waymond Lorelle Cummins, MD 07/18/24 [phone]

## 2024-08-11 ENCOUNTER — Other Ambulatory Visit: Payer: Self-pay | Admitting: Cardiovascular Disease

## 2024-08-12 ENCOUNTER — Other Ambulatory Visit: Payer: Self-pay | Admitting: Cardiovascular Disease

## 2024-09-05 ENCOUNTER — Other Ambulatory Visit: Payer: Self-pay | Admitting: Cardiovascular Disease

## 2024-09-07 NOTE — Telephone Encounter (Signed)
Please contact pt for future appointment. Pt overdue for 12 month f/u. 

## 2024-09-08 NOTE — Telephone Encounter (Signed)
 Called pt, they picked up the phone and then hung up.

## 2024-10-02 ENCOUNTER — Other Ambulatory Visit: Payer: Self-pay | Admitting: Cardiovascular Disease

## 2024-10-11 NOTE — Telephone Encounter (Signed)
 Call dropped

## 2024-10-19 ENCOUNTER — Emergency Department

## 2024-10-19 ENCOUNTER — Emergency Department
Admission: EM | Admit: 2024-10-19 | Discharge: 2024-10-19 | Disposition: A | Discharge status: Home / Self Care | Attending: Emergency Medicine | Admitting: Emergency Medicine

## 2024-10-19 ENCOUNTER — Other Ambulatory Visit: Payer: Self-pay

## 2024-10-19 DIAGNOSIS — I6529 Occlusion and stenosis of unspecified carotid artery: Secondary | ICD-10-CM | POA: Insufficient documentation

## 2024-10-19 DIAGNOSIS — F0390 Unspecified dementia without behavioral disturbance: Secondary | ICD-10-CM | POA: Insufficient documentation

## 2024-10-19 DIAGNOSIS — Z79899 Other long term (current) drug therapy: Secondary | ICD-10-CM | POA: Insufficient documentation

## 2024-10-19 DIAGNOSIS — I10 Essential (primary) hypertension: Secondary | ICD-10-CM | POA: Insufficient documentation

## 2024-10-19 DIAGNOSIS — E1165 Type 2 diabetes mellitus with hyperglycemia: Secondary | ICD-10-CM | POA: Insufficient documentation

## 2024-10-19 DIAGNOSIS — R519 Headache, unspecified: Secondary | ICD-10-CM | POA: Insufficient documentation

## 2024-10-19 DIAGNOSIS — R531 Weakness: Secondary | ICD-10-CM | POA: Insufficient documentation

## 2024-10-19 LAB — APTT: aPTT: 25 s (ref 24–36)

## 2024-10-19 LAB — CBC WITH DIFFERENTIAL/PLATELET
Abs Immature Granulocytes: 0.05 K/uL (ref 0.00–0.07)
Basophils Absolute: 0.1 K/uL (ref 0.0–0.1)
Basophils Relative: 1 %
Eosinophils Absolute: 0.3 K/uL (ref 0.0–0.5)
Eosinophils Relative: 3 %
HCT: 37.7 % (ref 36.0–46.0)
Hemoglobin: 12.5 g/dL (ref 12.0–15.0)
Immature Granulocytes: 1 %
Lymphocytes Relative: 23 %
Lymphs Abs: 2.2 K/uL (ref 0.7–4.0)
MCH: 30.7 pg (ref 26.0–34.0)
MCHC: 33.2 g/dL (ref 30.0–36.0)
MCV: 92.6 fL (ref 80.0–100.0)
Monocytes Absolute: 0.6 K/uL (ref 0.1–1.0)
Monocytes Relative: 7 %
Neutro Abs: 6.2 K/uL (ref 1.7–7.7)
Neutrophils Relative %: 65 %
Platelets: 235 K/uL (ref 150–400)
RBC: 4.07 MIL/uL (ref 3.87–5.11)
RDW: 12.7 % (ref 11.5–15.5)
Smear Review: NORMAL
WBC: 9.3 K/uL (ref 4.0–10.5)
nRBC: 0 % (ref 0.0–0.2)

## 2024-10-19 LAB — COMPREHENSIVE METABOLIC PANEL WITH GFR
ALT: 12 U/L (ref 0–44)
AST: 27 U/L (ref 15–41)
Albumin: 4.2 g/dL (ref 3.5–5.0)
Alkaline Phosphatase: 116 U/L (ref 38–126)
Anion gap: 14 (ref 5–15)
BUN: 10 mg/dL (ref 8–23)
CO2: 21 mmol/L — ABNORMAL LOW (ref 22–32)
Calcium: 9.8 mg/dL (ref 8.9–10.3)
Chloride: 100 mmol/L (ref 98–111)
Creatinine, Ser: 0.85 mg/dL (ref 0.44–1.00)
GFR, Estimated: >60 mL/min (ref >60)
Glucose, Bld: 226 mg/dL — ABNORMAL HIGH (ref 70–99)
Potassium: 4.5 mmol/L (ref 3.5–5.1)
Sodium: 135 mmol/L (ref 135–145)
Total Bilirubin: 0.4 mg/dL (ref 0.0–1.2)
Total Protein: 7.3 g/dL (ref 6.5–8.1)

## 2024-10-19 LAB — PROTIME-INR
INR: 0.8 (ref 0.8–1.2)
INR: 0.9 (ref 0.8–1.2)
Prothrombin Time: 11.9 s (ref 11.4–15.2)
Prothrombin Time: 12.5 s (ref 11.4–15.2)

## 2024-10-19 MED ORDER — ACETAMINOPHEN 500 MG PO TABS
1000.0000 mg | ORAL_TABLET | Freq: Once | ORAL | Status: AC
  Administered 2024-10-19: 1000 mg via ORAL
  Filled 2024-10-19: qty 2

## 2024-10-19 MED ORDER — KETOROLAC TROMETHAMINE 15 MG/ML IJ SOLN
15.0000 mg | Freq: Once | INTRAMUSCULAR | Status: AC
  Administered 2024-10-19: 15 mg via INTRAVENOUS
  Filled 2024-10-19: qty 1

## 2024-10-19 MED ORDER — DIPHENHYDRAMINE HCL 50 MG/ML IJ SOLN
12.5000 mg | Freq: Once | INTRAMUSCULAR | Status: AC
  Administered 2024-10-19: 12.5 mg via INTRAVENOUS
  Filled 2024-10-19: qty 1

## 2024-10-19 MED ORDER — IOHEXOL 350 MG/ML SOLN
75.0000 mL | Freq: Once | INTRAVENOUS | Status: AC | PRN
  Administered 2024-10-19: 75 mL via INTRAVENOUS

## 2024-10-19 MED ORDER — METOCLOPRAMIDE HCL 5 MG/ML IJ SOLN
10.0000 mg | Freq: Once | INTRAMUSCULAR | Status: AC
  Administered 2024-10-19: 10 mg via INTRAVENOUS
  Filled 2024-10-19: qty 2

## 2024-10-19 NOTE — ED Provider Notes (Signed)
 [facility] Provider Note    Event Date/Time   First MD Initiated Contact with Patient 10/19/24 0818     (approximate)   History   Headache   HPI  Destiny Savage is a 73 y.o. female with hypertension, migraines, diabetes who comes in with concerns for headache.  Patient reports having off-and-on headaches for the past couple of days.  She reports these are similar to her prior headaches.  She does report having a little bit of right sided weakness associated with the headache but she reports that she has had this previously when she gets the headaches and she states that this is ongoing for at least 24 hours.  She denies any symptom resolution over the past 24 hours.  Patient does report having a history of migraines.  She reports that she has not taken her blood pressure medications yet this morning.  She denies this being the worst headache of her life.  She reports that is similar to when she has been here previously.  She denies any chest pain, shortness of breath, abdominal pain or any other concerns.  She denies any vision changes.   I reviewed a note from 07/18/2024 where patient came in with headache  Physical Exam   Triage Vital Signs: ED Triage Vitals  Encounter Vitals Group     BP 10/19/24 0814 (!) 214/105     Girls Systolic BP Percentile --      Girls Diastolic BP Percentile --      Boys Systolic BP Percentile --      Boys Diastolic BP Percentile --      Pulse Rate 10/19/24 0814 91     Resp 10/19/24 0814 18     Temp 10/19/24 0814 99.2 F (37.3 C)     Temp Source 10/19/24 0814 Oral     SpO2 10/19/24 0814 97 %     Weight 10/19/24 0816 210 lb 9.6 oz (95.5 kg)     Height 10/19/24 0816 5' 6 (1.676 m)     Head Circumference --      Peak Flow --      Pain Score 10/19/24 0814 7     Pain Loc --      Pain Education --      Exclude from Growth Chart --     Most recent vital signs: Vitals:   10/19/24 0814  BP: (!) 214/105  Pulse: 91  Resp:  18  Temp: 99.2 F (37.3 C)  SpO2: 97%     General: Awake, no distress.  CV:  Good peripheral perfusion.  Resp:  Normal effort.  Abd:  No distention.  Other:  Cranial nerves are intact equal strength in arms and legs she reports some subjective weakness on the right side but she has got no pronator drift.  No drift with leg raise.  She has got sensation that is intact.  ED Results / Procedures / Treatments   Labs (all labs ordered are listed, but only abnormal results are displayed) Labs Reviewed  COMPREHENSIVE METABOLIC PANEL WITH GFR - Abnormal; Notable for the following components:      Result Value   CO2 21 (*)    Glucose, Bld 226 (*)    All other components within normal limits  CBC WITH DIFFERENTIAL/PLATELET  PROTIME-INR  APTT     EKG  My interpretation of EKG:  Normal sinus rate of 89 without any ST elevation or T wave inversions, normal intervals  RADIOLOGY I have  reviewed the ct personally and interpreted no evidence of intracranial hemorrhage   PROCEDURES:  Critical Care performed: No  .1-3 Lead EKG Interpretation  Performed by: Ernest Ronal BRAVO, MD Authorized by: Ernest Ronal BRAVO, MD     Interpretation: normal     ECG rate:  80   ECG rate assessment: normal     Rhythm: sinus rhythm     Ectopy: none     Conduction: normal      MEDICATIONS ORDERED IN ED: Medications  metoCLOPramide  (REGLAN ) injection 10 mg (10 mg Intravenous Given 10/19/24 0837)  diphenhydrAMINE  (BENADRYL ) injection 12.5 mg (12.5 mg Intravenous Given 10/19/24 0837)  acetaminophen  (TYLENOL ) tablet 1,000 mg (1,000 mg Oral Given 10/19/24 0837)     IMPRESSION / MDM / ASSESSMENT AND PLAN / ED COURSE  I reviewed the triage vital signs and the nursing notes.   Patient's presentation is most consistent with acute presentation with potential threat to life or bodily function.   Patient comes in with headache last known well more than 24 hours ago this seems consistent with patient's known  migraines.  In the past she also had some right-sided weakness and had MRI done on 07/18/2024 during 1 of these episodes that was negative for stroke.  She was followed by Dr. Lane and was last seen on 06/01/2024 where she is on medications for dementia as well as known headaches.  Patient is on nortriptyline  for her headache.  On the description of these they are located in the bitemporal region which is where she was having the headache today.  Patient was given migraine cocktail to try to help with symptomatic treatment.  Given patient's hypertension I did order CT head to make sure no evidence of intracranial hemorrhage.  I suspect that her elevated blood pressure is related to missed medications as well as related to pain as we repeated blood pressure without any blood pressure reducers and her blood pressure was 151/73.  Do not want to give her IV medications to lower blood pressure given she has already had significant drop in blood pressure .   Patient's glucose was 226 similar to prior.  CBC is reassuring.  CT head is normal.  10:58 AM repeat evaluation patient reports feeling at baseline self she denies any headaches, weakness.  She does report having a fullness in her ear and I did do a examination she does have cerumen noted.  We discussed irrigation but I suspect that could worsen her headache and she would like to follow this up outpatient and use of Debrox drops until then.  We discussed MRI but she has had MRIs previously when she had some of the subjective weakness on the right side in the setting of her headaches that have been negative and given she has resolution of symptoms, she was okay with holding off on repeat MRI today and following up with neurology outpatient.  11:35 AM I did review patient's prior CT scan that did show some carotid stenosis.  Discussed with vascular but they are currently in the OR therefore discussed with neurology Dr. Matthews who did recommend repeating the CT  angio to ensure that this is stable.  This is stable been patient can be followed up outpatient with vascular but if there is any new acute findings to it then she would recommend more urgent vascular evaluation.  I did discuss length the with patient's daughter about this and they decided that they were never told about need to have this follow-up.  We did discuss her blood pressure and that lowering it could actually make this worse and have worsening symptoms.  I talked with her about patient potentially going home but I was going talk to vascular and see what they wanted to do in regards to this known stenosis.  1:05 PM did discuss with Dr. Jama who also agreed on repeating the CT angio to see if it is worsening that they would maybe consider more emergent intervention.  Repeated patient patient reports headache is coming back and she reports some very mild right-sided weakness again.  Given this I have ordered an MRI to evaluate for stroke although patient now states that the weakness actually never went away (although she told me it had resolved she said she felt a little weak even when headache was resolved)  and that she has really been feeling it just got a little bit improved when the headache went down.  Therefore I do not feel like this meets activation for code stroke as she has had continued right-sided weakness at some degree for over 24 hours.  However if MRI is positive or if CT angio shows worsening stenosis patient may require admission or further interventions.    The patient is on the cardiac monitor to evaluate for evidence of arrhythmia and/or significant heart rate changes.   FINAL CLINICAL IMPRESSION(S) / ED DIAGNOSES   Final diagnoses:  Intractable headache, unspecified chronicity pattern, unspecified headache type     Rx / DC Orders   ED Discharge Orders          Ordered    Ambulatory Referral to Primary Care (Establish Care)        10/19/24 1110              Note:  This document was prepared using Dragon voice recognition software and may include unintentional dictation errors.   Ernest Ronal BRAVO, MD 10/19/24 [phone]

## 2024-10-19 NOTE — ED Notes (Signed)
 Patient returned from MRI, VSS denies pain currently, resting comfortable

## 2024-10-19 NOTE — ED Provider Notes (Signed)
  Physical Exam  BP (!) 158/87 (BP Location: Right Arm)   Pulse 81   Temp 98.8 F (37.1 C) (Oral)   Resp 15   Ht 5' 6 (1.676 m)   Wt 95.5 kg   SpO2 97%   BMI 33.99 kg/m   Physical Exam  Procedures  Procedures  ED Course / MDM   Clinical Course as of 10/19/24 2006  Tue Oct 19, 2024  1515 S/o from Dr. Ernest: - 20F hx migraines, has f/u w/ neuro in 6 days - when had HA, had R-sided weakness (has history of similar prior) - prior CTA during other episode with ?in-stent thrombosis - vascular wanted repeat CTA - had resolution of sx, getting migraine cocktail here  TO DO: - f/u CTA, d/w vascular  [MM]  1707 CTA head/neck: IMPRESSION: 1. No large vessel occlusion or significant intracranial stenosis. 2. Unchanged 50% stenosis of the proximal right ICA. 3. Patent left-sided cervical carotid stent with unchanged 50% stenosis in its midportion.   [MM]  1805 Dr. Dreama of vascular surgery reviewed imaging, stable from prior, assuming no infarct on MRI recommends follow-up with Dr. Marea in clinic [MM]  1948 MRI brain: IMPRESSION: 1. No acute intracranial abnormality. 2. Multifocal hyperintense T2-weighted signal within the cerebral white matter, most commonly due to chronic small vessel disease.   [MM]  2004 Patient reevaluated, headache notably improved from prior.  No ongoing deficits--denies any ongoing right-sided weakness or numbness.  Face symmetric, SI LT, normal strength bilateral upper extremities.  Discussed likelihood of complex migraine.  Discussed CT angio findings with vascular surgery plan for follow-up with Dr. Marea in clinic regarding stable carotid stenosis, they will call to schedule appointment, number provided  Plan for PMD follow-up in addition.  ED return precautions in place.  Patient and family agree with plan. [MM]    Clinical Course User Index [MM] Clarine Ozell LABOR, MD   Medical Decision Making Amount and/or Complexity of Data Reviewed Labs:  ordered. Radiology: ordered.  Risk OTC drugs. Prescription drug management.          Clarine Ozell LABOR, MD 10/19/24 2006

## 2024-10-19 NOTE — ED Notes (Signed)
 MRI called to speak to patient, IV premeds given, MRI called they will come for patient around 1800

## 2024-10-19 NOTE — ED Notes (Signed)
 Patient to MRI at 1815

## 2024-10-19 NOTE — ED Triage Notes (Signed)
 Pt coming from home. Complaining of headache since yesterday morning. progressively worse throughout the night. has nausea with headache no vomiting. Years ago dx with migraines. Pain on bilateral temperal areas of head  on High BP and metformin . Took meds last night not this morning  246CBG, 84, 99RA, 178/89

## 2024-10-19 NOTE — Discharge Instructions (Addendum)
 Your evaluation in the emergency department was overall reassuring.  We suspect you have a migraine headache, we saw no evidence of a stroke.  As discussed, there is some stenosis in your carotid artery, and you should call the vascular surgeon listed above (Dr. Tedra) to schedule appointment.  Please follow-up with your primary care provider as well.  Return to the emergency department with any new or worsening symptoms.

## 2024-11-02 ENCOUNTER — Other Ambulatory Visit (INDEPENDENT_AMBULATORY_CARE_PROVIDER_SITE_OTHER)

## 2024-11-02 ENCOUNTER — Ambulatory Visit (INDEPENDENT_AMBULATORY_CARE_PROVIDER_SITE_OTHER): Admitting: Vascular Surgery

## 2024-11-02 ENCOUNTER — Encounter (INDEPENDENT_AMBULATORY_CARE_PROVIDER_SITE_OTHER): Payer: Self-pay | Admitting: Vascular Surgery

## 2024-11-02 VITALS — BP 127/85 | HR 74 | Resp 17 | Ht 67.0 in | Wt 212.6 lb

## 2024-11-02 DIAGNOSIS — I6522 Occlusion and stenosis of left carotid artery: Secondary | ICD-10-CM

## 2024-11-02 DIAGNOSIS — E119 Type 2 diabetes mellitus without complications: Secondary | ICD-10-CM

## 2024-11-02 DIAGNOSIS — E782 Mixed hyperlipidemia: Secondary | ICD-10-CM

## 2024-11-02 DIAGNOSIS — I10 Essential (primary) hypertension: Secondary | ICD-10-CM

## 2024-11-02 DIAGNOSIS — I639 Cerebral infarction, unspecified: Secondary | ICD-10-CM

## 2024-11-02 NOTE — Progress Notes (Signed)
 MRN : [id number]  Destiny Savage is a 73 y.o. (April 30, 1951) female who presents with chief complaint of  Chief Complaint  Patient presents with   Follow-up    6 month follow up and carotid  .  History of Present Illness:   Discussed the use of AI scribe software for clinical note transcription with the patient, who gave verbal consent to proceed.  History of Present Illness Destiny Savage is a 73 year old female who presents for follow-up of her vascular stent. She is about a year and a half s/p left carotid stent placement  She has been doing well since her last visit. A recent ultrasound showed the stent is functioning well, with previously elevated velocities now in the normal range and right-sided stenosis measured at 1 to 39 percent. She has no new symptoms or concerns today.    Results RADIOLOGY Ultrasound: Stent velocity normal, no narrowing, right side mild range 1-39 (11/02/2024)  Current Outpatient Medications  Medication Sig Dispense Refill   acetaminophen  (TYLENOL ) 500 MG tablet Take 500-1,000 mg by mouth every 8 (eight) hours as needed.     albuterol  (VENTOLIN  HFA) 108 (90 Base) MCG/ACT inhaler Inhale 1 puff into the lungs every 6 (six) hours as needed for wheezing.     aspirin  81 MG tablet Take 1 tablet (81 mg total) by mouth daily.     atorvastatin  (LIPITOR) 40 MG tablet Take 1 tablet (40 mg total) by mouth daily. 90 tablet 3   carvedilol  (COREG ) 6.25 MG tablet Take 1 tablet (6.25 mg total) by mouth 2 (two) times daily with a meal. 180 tablet 3   cholecalciferol  (VITAMIN D3) 25 MCG (1000 UNIT) tablet Take 1,000 Units by mouth daily.     clopidogrel  (PLAVIX ) 75 MG tablet Take 1 tablet (75 mg total) by mouth daily. 90 tablet 3   furosemide  (LASIX ) 20 MG tablet TAKE 1 TABLET BY MOUTH ON MONDAY, WEDNESDAY AND FRIDAY 6 tablet 0   metFORMIN  (GLUCOPHAGE -XR) 500 MG 24 hr tablet Take 500 mg by mouth in the morning and 1000 mg at hight.     methocarbamol  (ROBAXIN ) 500 MG  tablet Take 500 mg by mouth every 8 (eight) hours as needed for muscle spasms.     Multiple Vitamin (MULTIVITAMIN WITH MINERALS) TABS tablet Take 1 tablet by mouth daily.     nortriptyline  (PAMELOR ) 10 MG capsule Take 10 mg by mouth 2 (two) times daily.     oxyCODONE -acetaminophen  (PERCOCET/ROXICET) 5-325 MG tablet Take 1 tablet by mouth every 8 (eight) hours as needed for severe pain.     polyethylene glycol (MIRALAX ) 17 g packet Take 17 g by mouth daily. 14 each 0   polyethylene glycol powder (GLYCOLAX /MIRALAX ) 17 GM/SCOOP powder 1 cap full in a full glass of water, two times a day for 3 days. 255 g 0   potassium chloride  (KLOR-CON ) 10 MEQ tablet TAKE 1 TABLET BY MOUTH 3 TIMES WEEKLY ON MONDAY, WEDNESDAY AND FRIDAY 6 tablet 0   senna-docusate (SENOKOT-S) 8.6-50 MG tablet Take 2 tablets by mouth 2 (two) times daily. 120 tablet 0   No current facility-administered medications for this visit.    Past Medical History:  Diagnosis Date   Arthritis    Asthma    Carotid stenosis    a. 01/2023 s/p L carotid stenting (9x7 Exact stent).   Chest pain    a. 12/2015 MV: No ischemia/infarct. EF 55-65%.   Diabetes mellitus    Diastolic dysfunction  a. 12/2018 Echo: EF >65%, impaired relaxation; b. 01/2023 Echo: EF 65-70%, no rwma, Gr I DD, nl RV fxn, mild MR.   GERD (gastroesophageal reflux disease)    Hyperlipidemia    Hypertension    Mini stroke    Stroke [facility])     Past Surgical History:  Procedure Laterality Date   ABDOMINAL HYSTERECTOMY     BREAST EXCISIONAL BIOPSY Left 1980's   benign   CARDIAC CATHETERIZATION     CAROTID PTA/STENT INTERVENTION Left 02/12/2023   Procedure: CAROTID PTA/STENT INTERVENTION;  Surgeon: Jama Cordella MATSU, MD;  Location: [facility] INVASIVE CV LAB;  Service: Cardiovascular;  Laterality: Left;   CHOLECYSTECTOMY       Social History[1]    Family History  Problem Relation Age of Onset   Heart Problems Father    Hypertension Other    CAD Other    Breast cancer  Neg Hx      Allergies[2]    REVIEW OF SYSTEMS (Negative unless checked)   Constitutional: [] Weight loss  [] Fever  [] Chills Cardiac: [] Chest pain   [] Chest pressure   [] Palpitations   [] Shortness of breath when laying flat   [] Shortness of breath at rest   [] Shortness of breath with exertion. Vascular:  [x] Pain in legs with walking   [x] Pain in legs at rest   [] Pain in legs when laying flat   [] Claudication   [] Pain in feet when walking  [] Pain in feet at rest  [] Pain in feet when laying flat   [] History of DVT   [] Phlebitis   [] Swelling in legs   [] Varicose veins   [] Non-healing ulcers Pulmonary:   [] Uses home oxygen   [] Productive cough   [] Hemoptysis   [] Wheeze  [] COPD   [x] Asthma Neurologic:  [] Dizziness  [] Blackouts   [] Seizures   [x] History of stroke   [x] History of TIA  [] Aphasia   [] Temporary blindness   [] Dysphagia   [] Weakness or numbness in arms   [] Weakness or numbness in legs Musculoskeletal:  [x] Arthritis   [] Joint swelling   [x] Joint pain   [] Low back pain Hematologic:  [] Easy bruising  [] Easy bleeding   [] Hypercoagulable state   [] Anemic   Gastrointestinal:  [] Blood in stool   [] Vomiting blood  [x] Gastroesophageal reflux/heartburn   [] Abdominal pain Genitourinary:  [] Chronic kidney disease   [] Difficult urination  [] Frequent urination  [] Burning with urination   [] Hematuria Skin:  [] Rashes   [] Ulcers   [] Wounds Psychological:  [] History of anxiety   []  History of major depression.  Physical Examination  BP 127/85   Pulse 74   Resp 17   Ht 5' 7 (1.702 m)   Wt 212 lb 9.6 oz (96.4 kg)   BMI 33.30 kg/m  Gen:  WD/WN, NAD Head: Williams/AT, No temporalis wasting. Ear/Nose/Throat: Hearing grossly intact, nares w/o erythema or drainage Eyes: Conjunctiva clear. Sclera non-icteric Neck: Supple.  Trachea midline. No bruit. Pulmonary:  Good air movement, no use of accessory muscles.  Cardiac: RRR, no JVD Vascular:  Vessel Right Left  Radial Palpable Palpable                Musculoskeletal: M/S 5/5 throughout.  No deformity or atrophy. No edema. Neurologic: Sensation grossly intact in extremities.  Symmetrical.  Speech is fluent.  Psychiatric: Judgment intact, Mood & affect appropriate for pt's clinical situation. Dermatologic: No rashes or ulcers noted.  No cellulitis or open wounds.  Physical Exam     Labs Recent Results (from the past 2160 hours)  CBC with Differential  Status: None   Collection Time: 10/19/24  8:21 AM  Result Value Ref Range   WBC 9.3 4.0 - 10.5 K/uL   RBC 4.07 3.87 - 5.11 MIL/uL   Hemoglobin 12.5 12.0 - 15.0 g/dL   HCT 62.2 63.9 - 53.9 %   MCV 92.6 80.0 - 100.0 fL   MCH 30.7 26.0 - 34.0 pg   MCHC 33.2 30.0 - 36.0 g/dL   RDW 87.2 88.4 - 84.4 %   Platelets 235 150 - 400 K/uL    Comment: PLATELET COUNT CONFIRMED BY SMEAR REPEATED TO VERIFY    nRBC 0.0 0.0 - 0.2 %   Neutrophils Relative % 65 %   Neutro Abs 6.2 1.7 - 7.7 K/uL   Lymphocytes Relative 23 %   Lymphs Abs 2.2 0.7 - 4.0 K/uL   Monocytes Relative 7 %   Monocytes Absolute 0.6 0.1 - 1.0 K/uL   Eosinophils Relative 3 %   Eosinophils Absolute 0.3 0.0 - 0.5 K/uL   Basophils Relative 1 %   Basophils Absolute 0.1 0.0 - 0.1 K/uL   WBC Morphology MORPHOLOGY UNREMARKABLE    RBC Morphology MORPHOLOGY UNREMARKABLE    Smear Review Normal platelet morphology    Immature Granulocytes 1 %   Abs Immature Granulocytes 0.05 0.00 - 0.07 K/uL    Comment: Performed at [facility], [address] [place], [place] [address]  Comprehensive metabolic panel     Status: Abnormal   Collection Time: 10/19/24  8:21 AM  Result Value Ref Range   Sodium 135 135 - 145 mmol/L   Potassium 4.5 3.5 - 5.1 mmol/L    Comment: HEMOLYSIS AT THIS LEVEL MAY AFFECT RESULT   Chloride 100 98 - 111 mmol/L   CO2 21 (L) 22 - 32 mmol/L   Glucose, Bld 226 (H) 70 - 99 mg/dL    Comment: Glucose reference range applies only to samples taken after fasting for at least 8 hours.   BUN 10 8 - 23  mg/dL   Creatinine, Ser 9.14 0.44 - 1.00 mg/dL   Calcium  9.8 8.9 - 10.3 mg/dL   Total Protein 7.3 6.5 - 8.1 g/dL   Albumin 4.2 3.5 - 5.0 g/dL   AST 27 15 - 41 U/L    Comment: HEMOLYZED SPECIMEN - SUGGEST RECOLLECT   ALT 12 0 - 44 U/L    Comment: HEMOLYZED SPECIMEN - SUGGEST RECOLLECT   Alkaline Phosphatase 116 38 - 126 U/L   Total Bilirubin 0.4 0.0 - 1.2 mg/dL   GFR, Estimated >39 >39 mL/min    Comment: (NOTE) Calculated using the CKD-EPI Creatinine Equation (2021)    Anion gap 14 5 - 15    Comment: Performed at [facility], [address] [place], [place] [address]  Protime-INR     Status: None   Collection Time: 10/19/24  8:21 AM  Result Value Ref Range   Prothrombin Time 11.9 11.4 - 15.2 seconds   INR 0.8 0.8 - 1.2    Comment: (NOTE) INR goal varies based on device and disease states. Performed at [facility], [address] [place], [place] [address]   Protime-INR     Status: None   Collection Time: 10/19/24 11:20 AM  Result Value Ref Range   Prothrombin Time 12.5 11.4 - 15.2 seconds   INR 0.9 0.8 - 1.2    Comment: (NOTE) INR goal varies based on device and disease states. Performed at [facility], [address], [place], [place] [address]  APTT     Status: None   Collection Time: 10/19/24 11:20 AM  Result Value Ref Range   aPTT 25 24 - 36 seconds    Comment: Performed at [facility], [address] [place], [place] [address]    Radiology MR BRAIN WO CONTRAST Result Date: 10/19/2024 EXAM: MRI BRAIN WITHOUT CONTRAST 10/19/2024 06:41:50 PM TECHNIQUE: Multiplanar multisequence MRI of the head/brain was performed without the administration of intravenous contrast. COMPARISON: 07/18/2024 CLINICAL HISTORY: Headache, neuro deficit. FINDINGS: BRAIN AND VENTRICLES: No acute infarct. No intracranial hemorrhage. No mass. No midline shift. No hydrocephalus. Multifocal hyperintense T2-weighted signal within the cerebral white  matter, most commonly due to chronic small vessel disease. The sella is unremarkable. Normal flow voids. ORBITS: No acute abnormality. SINUSES AND MASTOIDS: No acute abnormality. BONES AND SOFT TISSUES: Normal marrow signal. No acute soft tissue abnormality. IMPRESSION: 1. No acute intracranial abnormality. 2. Multifocal hyperintense T2-weighted signal within the cerebral white matter, most commonly due to chronic small vessel disease. Electronically signed by: Franky Stanford MD 10/19/2024 07:44 PM EST RP Workstation: [id number]   CT ANGIO HEAD NECK W WO CM Result Date: 10/19/2024 EXAM: CTA HEAD AND NECK WITH AND WITHOUT 10/19/2024 03:31:01 PM TECHNIQUE: CTA of the head and neck was performed with and without the administration of 75 mL iohexol  (OMNIPAQUE ) 350 MG/ML injection. Multiplanar 2D and/or 3D reformatted images are provided for review. Automated exposure control, iterative reconstruction, and/or weight based adjustment of the mA/kV was utilized to reduce the radiation dose to as low as reasonably achievable. Stenosis of the internal carotid arteries measured using NASCET criteria. COMPARISON: CTA head and neck 07/18/2024 CLINICAL HISTORY: Neuro deficit, acute, stroke suspected. FINDINGS: CTA NECK: AORTIC ARCH AND ARCH VESSELS: Normal variant aortic arch branching pattern with common origin of the brachiocephalic and left common carotid arteries. Small amount of calcified plaque in the distal aspect of the aortic arch. No dissection or arterial injury. No significant stenosis of the brachiocephalic or subclavian arteries. CERVICAL CAROTID ARTERIES: Prominent calcified plaque in the right carotid bulb results in 50% stenosis of the proximal right ICA, unchanged. A stent extending from the distal left common carotid artery into the proximal ICA remains patent with similar appearance of 50% stenosis of its midportion. No dissection or arterial injury. CERVICAL VERTEBRAL ARTERIES: Codominant vertebral arteries.  No dissection, arterial injury, or significant stenosis. LUNGS AND MEDIASTINUM: Unremarkable. SOFT TISSUES: No acute abnormality. BONES: Moderate cervical spondylosis. CTA HEAD: ANTERIOR CIRCULATION: The intracranial internal carotid arteries are widely patent. ACAs and MCAs are patent without evidence of a proximal brain occlusion or significant proximal stenosis. No aneurysm. POSTERIOR CIRCULATION: The intracranial vertebral arteries are widely patent to the basilar. The right PICA and left AICA appear dominant. Patent SCA origins are visualized bilaterally. Basilar artery is patent and congenitally small diffusely without a significant focal stenosis. The PCAs are patent with fetal type origins bilaterally and no evidence of a significant proximal stenosis. No aneurysm. OTHER: No dural venous sinus thrombosis on this non-dedicated study. IMPRESSION: 1. No large vessel occlusion or significant intracranial stenosis. 2. Unchanged 50% stenosis of the proximal right ICA. 3. Patent left-sided cervical carotid stent with unchanged 50% stenosis in its midportion. Electronically signed by: Dasie Hamburg MD 10/19/2024 04:25 PM EST RP Workstation: [id number]   CT HEAD WO CONTRAST ( ) Result Date: 10/19/2024 EXAM: CT HEAD WITHOUT CONTRAST 10/19/2024 09:06:47 AM TECHNIQUE: CT of the head was performed without the administration of intravenous contrast. Automated exposure control, iterative reconstruction, and/or weight based  adjustment of the mA/kV was utilized to reduce the radiation dose to as low as reasonably achievable. COMPARISON: Brain MRI and Head CT 07/18/2024. CLINICAL HISTORY: 73 year old female. Headache, sudden, severe.   hypertensive, hyperglycemia. FINDINGS: BRAIN AND VENTRICLES: No acute hemorrhage. No evidence of acute infarct. No hydrocephalus. No extra-axial collection. No mass effect or midline shift. Stable brain volume, within normal limits for age. Calcified atherosclerosis at the skull base. No  suspicious intracranial vascular hyperdensity. Mild for age chronic cerebral white matter changes appear stable. ORBITS: No acute abnormality. SINUSES: Paranasal sinuses, middle ears and mastoids remain well aerated. SOFT TISSUES AND SKULL: No acute soft tissue abnormality. No skull fracture. IMPRESSION: 1. No acute intracranial abnormality. 2. Stable mild for age chronic white matter disease. Electronically signed by: Helayne Hurst MD 10/19/2024 09:17 AM EST RP Workstation: [id number]    Assessment/Plan  No problem-specific Assessment & Plan notes found for this encounter.  Assessment & Plan Left carotid artery stenosis, post-stenting Ultrasound showed normal velocity and no significant narrowing. Mild right stenosis within 1-39% range.  - Schedule follow-up ultrasound in six months. - Transition to annual follow-up if results remain stable at next visit - continue current medical therapy.  Essential hypertension blood pressure control important in reducing the progression of atherosclerotic disease. On appropriate oral medications.     Stroke [facility]) Likely from her carotid disease.  Does still have some residual word finding and aphasia symptoms.   Diabetes type 2, controlled (HCC) blood glucose control important in reducing the progression of atherosclerotic disease. Also, involved in wound healing. On appropriate medications.     Dyslipidemia lipid control important in reducing the progression of atherosclerotic disease. Continue statin therapy   Selinda Gu, MD  11/02/2024 9:44 AM    This note was created with Dragon medical transcription system.  Any errors from dictation are purely unintentional     [1]  Social History Tobacco Use   Smoking status: Never   Smokeless tobacco: Never  Vaping Use   Vaping status: Never Used  Substance Use Topics   Alcohol use: No   Drug use: No  [2]  Allergies Allergen Reactions   Latex Hives and Rash    Other reaction(s): Other  (See Comments)   Valsartan  Shortness Of Breath    Mild sob after taking first and second dose of valsartan    Lidocaine  Hives   Phenobarbital-Belladonna Alk     Other reaction(s): Hives & Rash   Phenobarbital-Belladonna Alk Hives and Rash

## 2024-11-20 ENCOUNTER — Other Ambulatory Visit: Payer: Self-pay | Admitting: Cardiovascular Disease

## 2024-11-24 ENCOUNTER — Encounter: Payer: Self-pay | Admitting: *Deleted

## 2024-11-25 ENCOUNTER — Ambulatory Visit: Attending: Nurse Practitioner | Admitting: Nurse Practitioner

## 2024-11-25 ENCOUNTER — Encounter: Payer: Self-pay | Admitting: Nurse Practitioner

## 2024-11-25 VITALS — BP 128/74 | HR 75 | Ht 66.0 in | Wt 212.1 lb

## 2024-11-25 DIAGNOSIS — Z79899 Other long term (current) drug therapy: Secondary | ICD-10-CM

## 2024-11-25 DIAGNOSIS — R072 Precordial pain: Secondary | ICD-10-CM

## 2024-11-25 DIAGNOSIS — E118 Type 2 diabetes mellitus with unspecified complications: Secondary | ICD-10-CM

## 2024-11-25 DIAGNOSIS — I10 Essential (primary) hypertension: Secondary | ICD-10-CM

## 2024-11-25 DIAGNOSIS — E782 Mixed hyperlipidemia: Secondary | ICD-10-CM

## 2024-11-25 DIAGNOSIS — I2489 Other forms of acute ischemic heart disease: Secondary | ICD-10-CM | POA: Diagnosis not present

## 2024-11-25 DIAGNOSIS — I6522 Occlusion and stenosis of left carotid artery: Secondary | ICD-10-CM

## 2024-11-25 NOTE — Progress Notes (Signed)
 "    Office Visit    Patient Name: Destiny Savage Date of Encounter: 11/25/2024  Primary Care Provider:  Corlis Honor BROCKS, MD (Inactive) Primary Cardiologist:  Evalene Lunger, MD    Chief Complaint    74 y.o. female with history of TIA/stroke, type 2 diabetes mellitus, carotid stenosis status post left carotid stenting, hypertension, hyperlipidemia, chronic nausea and vomiting, chronic constipation, diarrhea, obstructive sleep apnea (not on CPAP), and diastolic dysfunction, presents for follow-up related to HTN.   Past Medical History   Subjective   Past Medical History:  Diagnosis Date   Arthritis    Asthma    Carotid stenosis    a. 01/2023 s/p L carotid stenting (9x7 Exact stent).   Chest pain    a. 12/2015 MV: No ischemia/infarct. EF 55-65%; b. 04/2023 MV: small area of ischemia in the apical anterior and apical segments though shifting breast attenuation could not be ruled out.  EF was normal.  Coronary artery calcifications and aortic atherosclerosis were present.   Diabetes mellitus    Diastolic dysfunction    a. 12/2018 Echo: EF >65%, impaired relaxation; b. 01/2023 Echo: EF 65-70%, no rwma, Gr I DD, nl RV fxn, mild MR.   GERD (gastroesophageal reflux disease)    Hyperlipidemia    Hypertension    Mini stroke    Stroke [facility])    Past Surgical History:  Procedure Laterality Date   ABDOMINAL HYSTERECTOMY     BREAST EXCISIONAL BIOPSY Left 1980's   benign   CARDIAC CATHETERIZATION     CAROTID PTA/STENT INTERVENTION Left 02/12/2023   Procedure: CAROTID PTA/STENT INTERVENTION;  Surgeon: Jama Cordella MATSU, MD;  Location: [facility] INVASIVE CV LAB;  Service: Cardiovascular;  Laterality: Left;   CHOLECYSTECTOMY      Allergies  Allergies[1]     History of Present Illness      74 y.o. y/o female with history of TIA/stroke, type 2 diabetes mellitus, carotid stenosis status post left carotid stenting, hypertension, hyperlipidemia, chronic nausea and vomiting, chronic constipation,  diarrhea, obstructive sleep apnea (not on CPAP), and diastolic dysfunction.  She previously underwent stress testing in 2017, which was nonischemic.  Echocardiogram in February 2020 showed normal LV function with diastolic dysfunction.  In March 2024, she was admitted with expressive aphasia, slurred speech, and left-sided facial numbness.  She was diagnosed with TIA.  She was found to have a 70% left internal carotid artery stenosis, which was successfully treated with stenting.  She was subsequently discharged but returned to the emergency department in early April with complaints of dyspnea and ongoing intermittent lightheadedness with difficulty focusing her vision since her TIA.  She was hypertensive in the emergency department.  ECG was unremarkable.  CTA of head and neck was notable for patent left carotid stent.  CT of the chest was negative for PE or other acute cardiopulmonary disease.  Troponin rose to 127.  Limited echo showed EF 60 to 65% with grade 1 diastolic dysfunction and mild MR.  She was seen by our team with recommendation for outpatient follow-up and consideration for coronary CT angiogram.   She was seen back in the ED on 03/10/2023, due to dyspnea that started after taking valsartan .  W/u was unremarkable w/o evidence of rash or anaphylaxis.  ED provider felt that symptoms were most likely related to ARB allergy/mild angioedema, and valsartan  was d/c'd with resolution of symptoms.  Coronary CT angiogram was ordered in May 2024 however, this was canceled due to lack of insurance authorization.  She was subsequently scheduled for Lexiscan  Myoview  in June 2024 which was abnormal with a small area of ischemia in the apical anterior and apical segments though shifting breast attenuation could not be ruled out.  EF was normal.  Coronary artery calcifications and aortic atherosclerosis were present.  She was doing well at July 2024 cardiology office visit and no further testing was pursued.    Over the course of 2025, patient had several ED visits for abdominal pain on 1 occasion and headache on 3 occasions, most recently December 2025.  On most recent evaluation, CT of the head showed no acute abnormality while CTA of the head and neck was performed and showed no large vessel occlusion with patent left carotid stent and 50% stenosis in the proximal right ICA.  From a cardiac standpoint, Destiny Savage has done reasonably well.  She says that she stays active and around her home but is not routinely exercising.  She denies chest pain, dyspnea, palpitations, PND, orthopnea, dizziness, syncope, edema, or early satiety. Objective   Home Medications    Current Outpatient Medications  Medication Sig Dispense Refill   acetaminophen  (TYLENOL ) 500 MG tablet Take 500-1,000 mg by mouth every 8 (eight) hours as needed.     albuterol  (VENTOLIN  HFA) 108 (90 Base) MCG/ACT inhaler Inhale 1 puff into the lungs every 6 (six) hours as needed for wheezing.     aspirin  81 MG tablet Take 1 tablet (81 mg total) by mouth daily.     atorvastatin  (LIPITOR) 40 MG tablet Take 1 tablet (40 mg total) by mouth daily. 90 tablet 3   carvedilol  (COREG ) 6.25 MG tablet Take 1 tablet (6.25 mg total) by mouth 2 (two) times daily with a meal. 180 tablet 3   cholecalciferol  (VITAMIN D3) 25 MCG (1000 UNIT) tablet Take 1,000 Units by mouth daily.     clopidogrel  (PLAVIX ) 75 MG tablet Take 1 tablet (75 mg total) by mouth daily. 90 tablet 3   furosemide  (LASIX ) 20 MG tablet Take 1 tablet by mouth Monday, Wednesday, and Friday 12 tablet 0   metFORMIN  (GLUCOPHAGE -XR) 500 MG 24 hr tablet Take 500 mg by mouth in the morning and 1000 mg at hight.     methocarbamol  (ROBAXIN ) 500 MG tablet Take 500 mg by mouth every 8 (eight) hours as needed for muscle spasms.     Multiple Vitamin (MULTIVITAMIN WITH MINERALS) TABS tablet Take 1 tablet by mouth daily.     nortriptyline  (PAMELOR ) 10 MG capsule Take 10 mg by mouth 2 (two) times daily.      oxyCODONE -acetaminophen  (PERCOCET/ROXICET) 5-325 MG tablet Take 1 tablet by mouth every 8 (eight) hours as needed for severe pain.     polyethylene glycol (MIRALAX ) 17 g packet Take 17 g by mouth daily. 14 each 0   potassium chloride  (KLOR-CON ) 10 MEQ tablet Take 1 tablets by mouth Monday, Wednesday, and Friday 12 tablet 0   senna-docusate (SENOKOT-S) 8.6-50 MG tablet Take 2 tablets by mouth 2 (two) times daily. 120 tablet 0   polyethylene glycol powder (GLYCOLAX /MIRALAX ) 17 GM/SCOOP powder 1 cap full in a full glass of water, two times a day for 3 days. (Patient not taking: Reported on 11/25/2024) 255 g 0   No current facility-administered medications for this visit.     Physical Exam    VS:  BP 128/74   Pulse 75   Ht 5' 6 (1.676 m)   Wt 212 lb 2 oz (96.2 kg)   SpO2 98%   BMI  34.24 kg/m  , BMI Body mass index is 34.24 kg/m.    Vitals:   11/25/24 1005 11/25/24 1043  BP: (!) 140/74 128/74  Pulse: 75   SpO2: 98%           GEN: Well nourished, well developed, in no acute distress. HEENT: normal. Neck: Supple, no JVD, carotid bruits, or masses. Cardiac: RRR, no murmurs, rubs, or gallops. No clubbing, cyanosis, edema.  Radials 2+/PT 2+ and equal bilaterally.  Respiratory:  Respirations regular and unlabored, clear to auscultation bilaterally. GI: Soft, nontender, nondistended, BS + x 4. MS: no deformity or atrophy. Skin: warm and dry, no rash. Neuro:  Strength and sensation are intact. Psych: Normal affect.  Accessory Clinical Findings    ECG personally reviewed by me today - EKG Interpretation Date/Time:  Thursday November 25 2024 10:13:35 EST Ventricular Rate:  75 PR Interval:  170 QRS Duration:  70 QT Interval:  374 QTC Calculation: 417 R Axis:   21  Text Interpretation: Normal sinus rhythm Confirmed by Vivienne Bruckner [phone]) on 11/25/2024 10:24:57 AM  - no acute changes.  Lab Results  Component Value Date   WBC 9.3 10/19/2024   HGB 12.5 10/19/2024   HCT 37.7  10/19/2024   MCV 92.6 10/19/2024   PLT 235 10/19/2024   Lab Results  Component Value Date   CREATININE 0.85 10/19/2024   BUN 10 10/19/2024   NA 135 10/19/2024   K 4.5 10/19/2024   CL 100 10/19/2024   CO2 21 (L) 10/19/2024   Lab Results  Component Value Date   ALT 12 10/19/2024   AST 27 10/19/2024   ALKPHOS 116 10/19/2024   BILITOT 0.4 10/19/2024   Lab Results  Component Value Date   CHOL 143 02/11/2023   HDL 60 02/11/2023   LDLCALC 71 02/11/2023   TRIG 59 02/11/2023   CHOLHDL 2.4 02/11/2023    Lab Results  Component Value Date   HGBA1C 7.0 (H) 02/10/2023   Lab Results  Component Value Date   TSH 5.185 (H) 07/18/2024       Assessment & Plan    1.  Demand ischemia/precordial pain: Patient with a history of elevated troponin in the setting of dyspnea in early 2024.  Echo showed normal LV function.  Insurance did not approve coronary CT angiogram and therefore, she underwent a Lexiscan  Myoview  in June 2024, which was abnormal with a small area of ischemia in the apical anterior and apical segments though shifting breast attenuation could not be ruled out.  EF was normal.  She was subsequently seen by Dr. Gollan and conservatively managed.  Over the past year, she has done well without chest pain or dyspnea.  She remains on aspirin , statin, beta-blocker, Plavix .  She is overdue for follow-up lipids which we will obtain today.  2.  Primary hypertension: Blood pressure initially elevated at 140/74 but improved upon repeat at 128/74.  She says this is similar to how she trends at home.  Continue beta-blocker and diuretic therapy.  3.  Hyperlipidemia: LDL of 71 in March 2024.  She had normal LFTs in December 2025.  She is fasting today we will follow-up lipids.  4.  TIA/carotid arterial disease: Status post left carotid stenting in March 2024.  Stable dzs on recent CTA.  Cont asa, plavix , statin.  Followed by vascular surgery.  5.  Type 2 diabetes mellitus: Followed by primary  care.  On metformin .  6.  Disposition: Follow-up in 6 months or sooner if necessary.  Lonni Meager, NP 11/25/2024, 12:24 PM     [1]  Allergies Allergen Reactions   Latex Hives and Rash    Other reaction(s): Other (See Comments)   Valsartan  Shortness Of Breath    Mild sob after taking first and second dose of valsartan    Lidocaine  Hives   Phenobarbital-Belladonna Alk     Other reaction(s): Hives & Rash   Phenobarbital-Belladonna Alk Hives and Rash   "

## 2024-11-25 NOTE — Patient Instructions (Signed)
 Medication Instructions:  Your physician recommends that you continue on your current medications as directed. Please refer to the Current Medication list given to you today.   *If you need a refill on your cardiac medications before your next appointment, please call your pharmacy*  Lab Work: Your provider would like for you to have following labs drawn today Lipid panel.   If you have labs (blood work) drawn today and your tests are completely normal, you will receive your results only by: MyChart Message (if you have MyChart) OR A paper copy in the mail If you have any lab test that is abnormal or we need to change your treatment, we will call you to review the results.  Follow-Up: At [facility], you and your health needs are our priority.  As part of our continuing mission to provide you with exceptional heart care, our providers are all part of one team.  This team includes your primary Cardiologist (physician) and Advanced Practice Providers or APPs (Physician Assistants and Nurse Practitioners) who all work together to provide you with the care you need, when you need it.  Your next appointment:   6 month(s)  Provider:   You may see Timothy Gollan, MD or Lonni Meager, NP   We recommend signing up for the patient portal called MyChart.  Sign up information is provided on this After Visit Summary.  MyChart is used to connect with patients for Virtual Visits (Telemedicine).  Patients are able to view lab/test results, encounter notes, upcoming appointments, etc.  Non-urgent messages can be sent to your provider as well.   To learn more about what you can do with MyChart, go to [email or web address].

## 2024-11-26 ENCOUNTER — Ambulatory Visit: Payer: Self-pay | Admitting: Nurse Practitioner

## 2024-11-26 DIAGNOSIS — Z79899 Other long term (current) drug therapy: Secondary | ICD-10-CM

## 2024-11-26 LAB — LIPID PANEL
Chol/HDL Ratio: 2.4 ratio (ref 0.0–4.4)
Cholesterol, Total: 163 mg/dL (ref 100–199)
HDL: 69 mg/dL
LDL Chol Calc (NIH): 78 mg/dL (ref 0–99)
Triglycerides: 85 mg/dL (ref 0–149)
VLDL Cholesterol Cal: 16 mg/dL (ref 5–40)

## 2024-11-26 MED ORDER — ATORVASTATIN CALCIUM 80 MG PO TABS: 80.0000 mg | ORAL_TABLET | Freq: Every day | ORAL | 3 refills | Status: AC

## 2024-11-26 MED ORDER — EZETIMIBE 10 MG PO TABS: 10.0000 mg | ORAL_TABLET | Freq: Every day | ORAL | 3 refills | Status: AC

## 2024-12-13 ENCOUNTER — Other Ambulatory Visit: Payer: Self-pay | Admitting: Cardiovascular Disease

## 2024-12-24 ENCOUNTER — Other Ambulatory Visit: Payer: Self-pay | Admitting: Cardiovascular Disease

## 2025-05-03 ENCOUNTER — Encounter (INDEPENDENT_AMBULATORY_CARE_PROVIDER_SITE_OTHER)

## 2025-05-03 ENCOUNTER — Ambulatory Visit (INDEPENDENT_AMBULATORY_CARE_PROVIDER_SITE_OTHER): Admitting: Vascular Surgery

## 2025-05-13 ENCOUNTER — Ambulatory Visit: Admitting: Nurse Practitioner
